# Patient Record
Sex: Female | Born: 1945 | Race: White | Hispanic: No | Marital: Married | Smoking: Never smoker
Health system: Southern US, Community
[De-identification: ages and names within clinical notes are randomized; demographics above are authoritative.]

## PROBLEM LIST (undated history)

## (undated) DIAGNOSIS — K219 Gastro-esophageal reflux disease without esophagitis: Secondary | ICD-10-CM

## (undated) DIAGNOSIS — I639 Cerebral infarction, unspecified: Secondary | ICD-10-CM

## (undated) DIAGNOSIS — M549 Dorsalgia, unspecified: Secondary | ICD-10-CM

## (undated) DIAGNOSIS — R0602 Shortness of breath: Secondary | ICD-10-CM

## (undated) DIAGNOSIS — R112 Nausea with vomiting, unspecified: Secondary | ICD-10-CM

## (undated) DIAGNOSIS — H269 Unspecified cataract: Secondary | ICD-10-CM

## (undated) DIAGNOSIS — K829 Disease of gallbladder, unspecified: Secondary | ICD-10-CM

## (undated) DIAGNOSIS — N159 Renal tubulo-interstitial disease, unspecified: Secondary | ICD-10-CM

## (undated) DIAGNOSIS — Z5189 Encounter for other specified aftercare: Secondary | ICD-10-CM

## (undated) DIAGNOSIS — E785 Hyperlipidemia, unspecified: Secondary | ICD-10-CM

## (undated) DIAGNOSIS — D126 Benign neoplasm of colon, unspecified: Secondary | ICD-10-CM

## (undated) DIAGNOSIS — M7989 Other specified soft tissue disorders: Secondary | ICD-10-CM

## (undated) DIAGNOSIS — M1990 Unspecified osteoarthritis, unspecified site: Secondary | ICD-10-CM

## (undated) DIAGNOSIS — K432 Incisional hernia without obstruction or gangrene: Secondary | ICD-10-CM

## (undated) DIAGNOSIS — M869 Osteomyelitis, unspecified: Secondary | ICD-10-CM

## (undated) DIAGNOSIS — K5900 Constipation, unspecified: Secondary | ICD-10-CM

## (undated) DIAGNOSIS — I10 Essential (primary) hypertension: Secondary | ICD-10-CM

## (undated) DIAGNOSIS — G809 Cerebral palsy, unspecified: Secondary | ICD-10-CM

## (undated) DIAGNOSIS — Z936 Other artificial openings of urinary tract status: Secondary | ICD-10-CM

## (undated) DIAGNOSIS — Z9889 Other specified postprocedural states: Secondary | ICD-10-CM

## (undated) HISTORY — DX: Osteomyelitis, unspecified: M86.9

## (undated) HISTORY — DX: Essential (primary) hypertension: I10

## (undated) HISTORY — DX: Unspecified cataract: H26.9

## (undated) HISTORY — DX: Other specified soft tissue disorders: M79.89

## (undated) HISTORY — DX: Constipation, unspecified: K59.00

## (undated) HISTORY — DX: Cerebral infarction, unspecified: I63.9

## (undated) HISTORY — DX: Renal tubulo-interstitial disease, unspecified: N15.9

## (undated) HISTORY — DX: Dorsalgia, unspecified: M54.9

## (undated) HISTORY — PX: CHOLECYSTECTOMY: SHX55

## (undated) HISTORY — PX: TONSILLECTOMY: SUR1361

## (undated) HISTORY — PX: APPENDECTOMY: SHX54

## (undated) HISTORY — DX: Hyperlipidemia, unspecified: E78.5

## (undated) HISTORY — DX: Unspecified osteoarthritis, unspecified site: M19.90

## (undated) HISTORY — DX: Cerebral palsy, unspecified: G80.9

## (undated) HISTORY — DX: Disease of gallbladder, unspecified: K82.9

## (undated) HISTORY — PX: OTHER SURGICAL HISTORY: SHX169

## (undated) HISTORY — DX: Shortness of breath: R06.02

## (undated) HISTORY — DX: Benign neoplasm of colon, unspecified: D12.6

---
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
## 1969-06-16 HISTORY — PX: WISDOM TOOTH EXTRACTION: SHX21

## 1973-06-16 HISTORY — PX: TUBAL LIGATION: SHX77

## 1973-06-16 HISTORY — PX: OTHER SURGICAL HISTORY: SHX169

## 1981-06-16 HISTORY — PX: CARPAL TUNNEL RELEASE: SHX101

## 1983-06-17 HISTORY — PX: OTHER SURGICAL HISTORY: SHX169

## 1984-06-16 HISTORY — PX: OTHER SURGICAL HISTORY: SHX169

## 1988-06-16 HISTORY — PX: ABDOMINAL HYSTERECTOMY: SHX81

## 1988-06-16 HISTORY — PX: VAGINAL HYSTERECTOMY: SUR661

## 1990-06-16 DIAGNOSIS — IMO0001 Reserved for inherently not codable concepts without codable children: Secondary | ICD-10-CM

## 1990-06-16 DIAGNOSIS — Z5189 Encounter for other specified aftercare: Secondary | ICD-10-CM

## 1990-06-16 HISTORY — PX: OTHER SURGICAL HISTORY: SHX169

## 1990-06-16 HISTORY — PX: REVISION UROSTOMY CUTANEOUS: SUR1282

## 1990-06-16 HISTORY — DX: Reserved for inherently not codable concepts without codable children: IMO0001

## 1990-06-16 HISTORY — DX: Encounter for other specified aftercare: Z51.89

## 2002-03-27 ENCOUNTER — Emergency Department (HOSPITAL_COMMUNITY): Admission: EM | Admit: 2002-03-27 | Discharge: 2002-03-27 | Payer: Self-pay | Admitting: Emergency Medicine

## 2002-03-27 ENCOUNTER — Encounter: Payer: Self-pay | Admitting: Emergency Medicine

## 2006-02-10 ENCOUNTER — Encounter: Payer: Self-pay | Admitting: Family Medicine

## 2008-02-08 ENCOUNTER — Encounter: Payer: Self-pay | Admitting: Family Medicine

## 2008-04-05 ENCOUNTER — Encounter: Payer: Self-pay | Admitting: Family Medicine

## 2008-07-05 LAB — HM COLONOSCOPY: HM Colonoscopy: NORMAL

## 2008-09-07 ENCOUNTER — Encounter: Payer: Self-pay | Admitting: Family Medicine

## 2009-03-16 LAB — CONVERTED CEMR LAB: Pap Smear: NORMAL

## 2009-11-29 ENCOUNTER — Ambulatory Visit: Payer: Self-pay | Admitting: Emergency Medicine

## 2009-11-29 DIAGNOSIS — A088 Other specified intestinal infections: Secondary | ICD-10-CM

## 2009-11-29 DIAGNOSIS — E785 Hyperlipidemia, unspecified: Secondary | ICD-10-CM | POA: Insufficient documentation

## 2009-11-29 DIAGNOSIS — I10 Essential (primary) hypertension: Secondary | ICD-10-CM | POA: Insufficient documentation

## 2009-11-29 HISTORY — DX: Essential (primary) hypertension: I10

## 2009-11-29 HISTORY — DX: Hyperlipidemia, unspecified: E78.5

## 2010-06-19 ENCOUNTER — Encounter: Payer: Self-pay | Admitting: Family Medicine

## 2010-07-11 ENCOUNTER — Ambulatory Visit
Admission: RE | Admit: 2010-07-11 | Discharge: 2010-07-11 | Payer: Self-pay | Source: Home / Self Care | Attending: Family Medicine | Admitting: Family Medicine

## 2010-07-11 DIAGNOSIS — D126 Benign neoplasm of colon, unspecified: Secondary | ICD-10-CM

## 2010-07-11 DIAGNOSIS — G809 Cerebral palsy, unspecified: Secondary | ICD-10-CM

## 2010-07-11 HISTORY — DX: Cerebral palsy, unspecified: G80.9

## 2010-07-11 HISTORY — DX: Benign neoplasm of colon, unspecified: D12.6

## 2010-07-16 NOTE — Assessment & Plan Note (Signed)
Summary: VOMIT/DIARRHEA/TJ   Vital Signs:  Patient Profile:   65 Years Old Female CC:      Vomiting x 1 day Height:     69.5 inches Weight:      195 pounds O2 Sat:      97 % O2 treatment:    Room Air Temp:     98.4 degrees F Pulse rate:   122 / minute Pulse rhythm:   irregular Resp:     20 per minute BP sitting:   140 / 72  (right arm) Cuff size:   large  Vitals Entered By: Emilio Math (November 29, 2009 10:47 AM)             Is Patient Diabetic? Yes       Current Allergies: ! MORPHINE ! REGLAN ! PENICILLINHistory of Present Illness Chief Complaint: Vomiting x 1 day History of Present Illness: 65yo WF with multiple med problems (DM2, Cerebral palsy) presents with N/V x 10 hours.  Started early this morning and 6 episodes.  None in last 3 hours.  No strange or spoiled foods.  Her husband who has eaten the same foods feels fine.  Her BS this morning was 160.  No blood.  Loose stools today but no diarrhea.  Abd pain is mild and cramping.  Current Meds SIMVASTATIN 40 MG TABS (SIMVASTATIN)  LOSARTAN POTASSIUM 50 MG TABS (LOSARTAN POTASSIUM)  FUROSEMIDE 80 MG TABS (FUROSEMIDE)  METFORMIN HCL 500 MG TABS (METFORMIN HCL)  MIRALAX  POWD (POLYETHYLENE GLYCOL 3350)  MUCINEX 600 MG XR12H-TAB (GUAIFENESIN)  ZOFRAN ODT 4 MG TBDP (ONDANSETRON) 1 dissolvable tab by mouth Q6-8 hours as needed nausea  REVIEW OF SYSTEMS Constitutional Symptoms      Denies fever, chills, night sweats, weight loss, weight gain, and fatigue.  Eyes       Denies change in vision, eye pain, eye discharge, glasses, contact lenses, and eye surgery. Ear/Nose/Throat/Mouth       Denies hearing loss/aids, change in hearing, ear pain, ear discharge, dizziness, frequent runny nose, frequent nose bleeds, sinus problems, sore throat, hoarseness, and tooth pain or bleeding.  Respiratory       Denies dry cough, productive cough, wheezing, shortness of breath, asthma, bronchitis, and emphysema/COPD.  Cardiovascular  Denies murmurs, chest pain, and tires easily with exhertion.    Gastrointestinal       Complains of nausea/vomiting.      Denies stomach pain, diarrhea, constipation, blood in bowel movements, and indigestion. Genitourniary       Denies painful urination, kidney stones, and loss of urinary control. Neurological       Denies paralysis, seizures, and fainting/blackouts. Musculoskeletal       Denies muscle pain, joint pain, joint stiffness, decreased range of motion, redness, swelling, muscle weakness, and gout.  Skin       Denies bruising, unusual mles/lumps or sores, and hair/skin or nail changes.  Psych       Denies mood changes, temper/anger issues, anxiety/stress, speech problems, depression, and sleep problems.  Past History:  Past Medical History: Diabetes mellitus, type I Hyperlipidemia Hypertension Cerebral Palsy Urostomy with Ileal Conduit  Past Surgical History: Hysterectomy Many orthopedic surgeries  Family History: Mother, D, COPD, Heart Disease Father, D, COPD, Emphysema, Bladder CA, Colon CA  Social History: Non smoker No ETOH No DRugs Physical Exam General appearance: well developed, well nourished, no acute distress.  Sitting in wheelchair. Chest/Lungs: no rales, wheezes, or rhonchi bilateral, breath sounds equal without effort Heart: regular rate and  rhythm,  no murmur Abdomen: positive mild epigastric tenderness, abdomen soft without obvious organomegaly Extremities: normal extremities Skin: no obvious rashes or lesions Assessment New Problems: GASTROENTERITIS, VIRAL (ICD-008.8) HYPERTENSION (ICD-401.9) HYPERLIPIDEMIA (ICD-272.4) DIABETES MELLITUS, TYPE I (ICD-250.01)   Plan New Medications/Changes: ZOFRAN ODT 4 MG TBDP (ONDANSETRON) 1 dissolvable tab by mouth Q6-8 hours as needed nausea  #20 x 0, 11/29/2009, Hoyt Koch MD   The patient and/or caregiver has been counseled thoroughly with regard to medications prescribed including dosage,  schedule, interactions, rationale for use, and possible side effects and they verbalize understanding.  Diagnoses and expected course of recovery discussed and will return if not improved as expected or if the condition worsens. Patient and/or caregiver verbalized understanding.  Prescriptions: ZOFRAN ODT 4 MG TBDP (ONDANSETRON) 1 dissolvable tab by mouth Q6-8 hours as needed nausea  #20 x 0   Entered and Authorized by:   Hoyt Koch MD   Signed by:   Hoyt Koch MD on 11/29/2009   Method used:   Print then Give to Patient   RxID:   [id number]   Patient Instructions: 1)  Parke Simmers diet for 5-7 days, expect diarrhea as virus progresses through your system, check BS a few times a day, Follow-up with your primary care physician.

## 2010-07-18 NOTE — Assessment & Plan Note (Signed)
Summary: to be est/new medicare pt/njr   Vital Signs:  Patient profile:   65 year old female Menstrual status:  postmenopausal Height:      59.25 inches Weight:      202 pounds BMI:     40.60 Temp:     98.4 degrees F oral Pulse rate:   80 / minute Pulse rhythm:   regular Resp:     12 per minute BP sitting:   140 / 76  (left arm) Cuff size:   large  Vitals Entered By: Sid Falcon LPN (July 11, 2010 10:34 AM)  Nutrition Counseling: Patient's BMI is greater than 25 and therefore counseled on weight management options.  History of Present Illness:  New patient to establish care.  Past medical history reviewed. History of cerebral palsy, osteoarthritis mostly involving low back, type 2 diabetes diagnosed 2010, hypertension, hyperlipidemia, benign colon polyps, neurogenic bladder with prior history of urostomy and ileal conduit surgery.  Multiple prior surgeries including cholecystectomy, appendectomy, tonsillectomy, and partial hysterectomy secondary to menorrhagia. Previous right ankle fusion and urology surgery as above. Multiple prior orthopedic surgeries related to cerebral palsy.  Allergy to morphine, penicillin, and Reglan.  Medications all reviewed. Recent titration of the losartan secondary to high urine microalbumin ratio.  Diabetes and well-controlled. Recent A1c 6.7%. Recent lipids well-controlled. Normal GFR.  Family history significant for father with colon cancer and prostate cancer. Both parents with hypertension. Mother had stroke and heart disease in her 82s. Sister with rheumatoid arthritis.  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 No alcohol or nicotine use.  Tetanus 2010. Pneumovax 2006. Colonoscopy 2010 with rec 5 year followup. Positive flu vaccine earlier this year.   Preventive Screening-Counseling & Management  Caffeine-Diet-Exercise     Does Patient Exercise: yes  Allergies: 1)  ! Morphine 2)   ! Reglan 3)  ! Penicillin  Past History:  Family History: Last updated: 07/11/2010 Mother, D, COPD, Heart Disease 40s Father, D, COPD, Emphysema, Bladder CA, Colon CA Sister RA  Social History: Last updated: 07/11/2010 Non smoker No ETOH No DRugs Occupation:  Veterinary surgeon, Merchant [employer] officer Regular exercise-yes 1 pregnancy 1 live birth  Risk Factors: Exercise: yes (07/11/2010)  Past Medical History: Diabetes mellitus, type 2 Hyperlipidemia Hypertension Cerebral Palsy Osteoarthritis Hx neurogenic bladder  Past Surgical History: Hysterectomy, partial menorrhagia Many orthopedic surgeries Appendectomy Cholecystectomy Tonsillectomy Urostomy with ilial conduit 1992  Family History: Mother, D, COPD, Heart Disease 108s Father, D, COPD, Emphysema, Bladder CA, Colon CA Sister RA  Social History: Non smoker No ETOH No DRugs Occupation:  Veterinary surgeon, Merchant [employer] officer Regular exercise-yes 1 pregnancy 1 live birth Occupation:  employed Does Patient Exercise:  yes  Review of Systems  The patient denies anorexia, fever, weight loss, vision loss, hoarseness, chest pain, syncope, prolonged cough, headaches, hemoptysis, abdominal pain, melena, hematochezia, severe indigestion/heartburn, hematuria, muscle weakness, suspicious skin lesions, transient blindness, depression, and enlarged lymph nodes.    Physical Exam  General:  obese, pleasant cooerative  Head:  Normocephalic and atraumatic without obvious abnormalities. No apparent alopecia or balding. Eyes:  pupils equal, pupils round, and pupils reactive to light.   Ears:  minimal cerumen R ear. Mouth:  Oral mucosa and oropharynx without lesions or exudates.  Teeth in good repair. Neck:  No deformities, masses, or tenderness noted. Lungs:  Normal respiratory effort, chest expands symmetrically. Lungs are clear to auscultation, no crackles or wheezes. Heart:  Normal rate and regular rhythm. S1 and S2 normal without  gallop, murmur,  click, rub or other extra sounds. Extremities:  No clubbing, cyanosis, edema, or deformity noted with normal full range of motion of all joints.   Neurologic:  alert & oriented X3 and cranial nerves II-XII intact.    Diabetes Management Exam:    Foot Exam (with socks and/or shoes not present):       Sensory-Pinprick/Light touch:          Left medial foot (L-4): normal          Left dorsal foot (L-5): normal          Left lateral foot (S-1): normal          Right medial foot (L-4): normal          Right dorsal foot (L-5): normal          Right lateral foot (S-1): normal       Sensory-Monofilament:          Left foot: normal          Right foot: normal       Inspection:          Left foot: normal          Right foot: normal       Nails:          Left foot: normal          Right foot: normal   Impression & Recommendations:  Problem # 1:  HYPERLIPIDEMIA (ICD-272.4)  Her updated medication list for this problem includes:    Simvastatin 40 Mg Tabs (Simvastatin) ..... Once daily at bedtime  Problem # 2:  DIABETES MELLITUS, TYPE I (ICD-250.01)  Her updated medication list for this problem includes:    Losartan Potassium 100 Mg Tabs (Losartan potassium) ..... Once daily in morning    Metformin Hcl 500 Mg Tabs (Metformin hcl) .Marland Kitchen... 2 times daily  Problem # 3:  HYPERTENSION (ICD-401.9)  Her updated medication list for this problem includes:    Losartan Potassium 100 Mg Tabs (Losartan potassium) ..... Once daily in morning    Furosemide 80 Mg Tabs (Furosemide) ..... Once daily  Problem # 4:  CEREBRAL PALSY (ICD-343.9)  Problem # 5:  COLONIC POLYPS (ICD-211.3)  Complete Medication List: 1)  Simvastatin 40 Mg Tabs (Simvastatin) .... Once daily at bedtime 2)  Losartan Potassium 100 Mg Tabs (Losartan potassium) .... Once daily in morning 3)  Furosemide 80 Mg Tabs (Furosemide) .... Once daily 4)  Metformin Hcl 500 Mg Tabs (Metformin hcl) .... 2 times daily 5)   Miralax Powd (Polyethylene glycol 3350) .Marland Kitchen.. 1 capfull daily by mouth with liquid 6)  Mucinex 600 Mg Xr12h-tab (Guaifenesin) .... Once daily 7)  Zyrtec Allergy 10 Mg Tabs (Cetirizine hcl) .... Once daily  Patient Instructions: 1)  Please schedule a follow-up appointment in 3 months .    Orders Added: 1)  New Patient Level III [99203]   Immunization History:  Influenza Immunization History:    Influenza:  historical (04/16/2010)  Pneumovax Immunization History:    Pneumovax:  historical (06/16/2004)  Tetanus/Td Immunization History:    Tetanus/Td:  historical (06/16/2005)   Immunization History:  Influenza Immunization History:    Influenza:  Historical (04/16/2010)  Pneumovax Immunization History:    Pneumovax:  Historical (06/16/2004)  Tetanus/Td Immunization History:    Tetanus/Td:  Historical (06/16/2005)  Preventive Care Screening  Last Flu Shot:    Date:  04/16/2010    Results:  Historical   Pap Smear:    Date:  03/16/2009  Results:  normal   Colonoscopy:    Date:  06/16/2008    Results:  normal   Mammogram:    Date:  06/16/2008    Results:  normal   Last Tetanus Booster:    Date:  06/16/2005    Results:  Historical   Last Pneumovax:    Date:  06/16/2004    Results:  Historical

## 2010-07-23 ENCOUNTER — Other Ambulatory Visit: Payer: Self-pay | Admitting: Family Medicine

## 2010-07-24 NOTE — Procedures (Signed)
Summary: Colonoscopy Report/[facility]   Colonoscopy Report/[facility]   Imported By: Maryln Gottron 07/17/2010 11:23:19  _____________________________________________________________________  External Attachment:    Type:   Image     Comment:   External Document

## 2010-08-20 ENCOUNTER — Telehealth: Payer: Self-pay | Admitting: Family Medicine

## 2010-08-20 NOTE — Telephone Encounter (Signed)
Pt has a power wheelchair that is 65 yrs old she's having problem with it. Pt would like a rx fax to # [phone] to  [facility] physical therapy/sport medicine for evaluation for a new wheelchair.

## 2010-08-20 NOTE — Telephone Encounter (Signed)
written

## 2010-08-20 NOTE — Telephone Encounter (Signed)
Written Rx for evaluation of power wheelchair faxed to [facility] at [phone], confirmation received

## 2010-10-04 ENCOUNTER — Encounter: Payer: Self-pay | Admitting: Family Medicine

## 2010-10-09 ENCOUNTER — Ambulatory Visit (INDEPENDENT_AMBULATORY_CARE_PROVIDER_SITE_OTHER): Payer: Medicare Other | Admitting: Family Medicine

## 2010-10-09 ENCOUNTER — Encounter: Payer: Self-pay | Admitting: Family Medicine

## 2010-10-09 DIAGNOSIS — I10 Essential (primary) hypertension: Secondary | ICD-10-CM

## 2010-10-09 DIAGNOSIS — G809 Cerebral palsy, unspecified: Secondary | ICD-10-CM

## 2010-10-09 DIAGNOSIS — E119 Type 2 diabetes mellitus without complications: Secondary | ICD-10-CM | POA: Insufficient documentation

## 2010-10-09 LAB — BASIC METABOLIC PANEL
BUN: 19 mg/dL (ref 6–23)
Calcium: 9.9 mg/dL (ref 8.4–10.5)
Creatinine, Ser: 0.7 mg/dL (ref 0.4–1.2)
GFR: 87.44 mL/min (ref >60.00)
Glucose, Bld: 130 mg/dL — ABNORMAL HIGH (ref 70–99)

## 2010-10-09 NOTE — Progress Notes (Signed)
  Subjective:    Patient ID: Jordan [name], female    DOB: 30-Aug-1945, 65 y.o.   MRN: [id number]  HPI Patient seen for medical followup. She has history of type 2 diabetes, hyperlipidemia, morbid obesity, hypertension, and cerebral palsy. Blood sugars ranging 137 160 fasting. Takes metformin 500 mg twice daily. Last hemoglobin A1c 6.7% several months ago. She's never had any nutritional counseling.  She has history of cerebral palsy. Multiple back surgery and multiple prior orthopedic surgeries. She is wheelchair-bound. Needs replacement wheelchair. Also has significant upper extremity weakness and needs totally powered wheelchair. She is able to mobilize within her home without difficulty. She has limited neck mobility secondary to arthritis. She is fully competent to maneuver her wheelchair.  Diuretic therapy with furosemide 80 mg daily. Also takes Cozaar. No potassium replacement. Needs electrolytes.   Review of Systems  Constitutional: Negative for fever, chills, appetite change and unexpected weight change.  Respiratory: Negative for cough and shortness of breath.   Cardiovascular: Negative for chest pain and palpitations.  Genitourinary:       [She has neurogenic bladder and some chronic urinary incontinence. Followed by urology Neurological: Negative for dizziness and headaches.       Objective:   Physical Exam  Constitutional: She is oriented to person, place, and time. She appears well-developed and well-nourished.  HENT:  Head: Normocephalic and atraumatic.  Mouth/Throat: Oropharynx is clear and moist. No oropharyngeal exudate.  Cardiovascular: Normal rate, regular rhythm and normal heart sounds.   No murmur heard. Pulmonary/Chest: Effort normal and breath sounds normal. No respiratory distress. She has no wheezes. She has no rales.  Musculoskeletal:       Chest some chronic nonpitting edema of feet ankles and lower legs bilaterally  Lymphadenopathy:    She has no cervical  adenopathy.  Neurological: She is alert and oriented to person, place, and time. No cranial nerve deficit.  Psychiatric: She has a normal mood and affect.          Assessment & Plan:  #1 type 2 diabetes. History of decent control. Recheck A1c today. Recommend referral to nutritionist for further education #2 diuretic use and hypertension. Recheck basic metabolic panel. #3 Cerebral palsy. Patient needs replacement wheelchair. She'll initiate paperwork Through supplier

## 2010-10-09 NOTE — Progress Notes (Signed)
Quick Note:  Pt informed on personally identified VM, med sig changed to new directions ______

## 2010-10-18 ENCOUNTER — Other Ambulatory Visit: Payer: Self-pay | Admitting: Family Medicine

## 2010-10-18 MED ORDER — METFORMIN HCL 500 MG PO TABS: ORAL_TABLET | ORAL | Status: DC

## 2010-10-22 ENCOUNTER — Encounter: Payer: Medicare Other | Attending: Family Medicine | Admitting: *Deleted

## 2010-10-22 DIAGNOSIS — I10 Essential (primary) hypertension: Secondary | ICD-10-CM | POA: Insufficient documentation

## 2010-10-22 DIAGNOSIS — Z713 Dietary counseling and surveillance: Secondary | ICD-10-CM | POA: Insufficient documentation

## 2010-10-22 DIAGNOSIS — E119 Type 2 diabetes mellitus without complications: Secondary | ICD-10-CM | POA: Insufficient documentation

## 2010-10-31 ENCOUNTER — Telehealth: Payer: Self-pay | Admitting: Family Medicine

## 2010-10-31 NOTE — Telephone Encounter (Signed)
Was seen recently and a new rx for metformin with brand new instructions were supposed to be at [facility]. Need status.

## 2010-10-31 NOTE — Telephone Encounter (Signed)
Pt informed on VM that I checked with the pharmacy and they do have the Rx from 5/4, #90 with 11 refills

## 2010-12-03 ENCOUNTER — Encounter: Payer: Medicare Other | Attending: Family Medicine | Admitting: *Deleted

## 2010-12-03 DIAGNOSIS — I10 Essential (primary) hypertension: Secondary | ICD-10-CM | POA: Insufficient documentation

## 2010-12-03 DIAGNOSIS — Z713 Dietary counseling and surveillance: Secondary | ICD-10-CM | POA: Insufficient documentation

## 2010-12-03 DIAGNOSIS — E119 Type 2 diabetes mellitus without complications: Secondary | ICD-10-CM | POA: Insufficient documentation

## 2010-12-19 ENCOUNTER — Other Ambulatory Visit: Payer: Self-pay | Admitting: Family Medicine

## 2010-12-28 ENCOUNTER — Other Ambulatory Visit: Payer: Self-pay | Admitting: Family Medicine

## 2010-12-31 ENCOUNTER — Other Ambulatory Visit (INDEPENDENT_AMBULATORY_CARE_PROVIDER_SITE_OTHER): Payer: Medicare Other

## 2010-12-31 ENCOUNTER — Other Ambulatory Visit: Payer: Self-pay | Admitting: *Deleted

## 2010-12-31 DIAGNOSIS — Z0000 Encounter for general adult medical examination without abnormal findings: Secondary | ICD-10-CM

## 2010-12-31 DIAGNOSIS — E119 Type 2 diabetes mellitus without complications: Secondary | ICD-10-CM

## 2010-12-31 DIAGNOSIS — E785 Hyperlipidemia, unspecified: Secondary | ICD-10-CM

## 2010-12-31 LAB — POCT URINALYSIS DIPSTICK
Bilirubin, UA: NEGATIVE
Ketones, UA: NEGATIVE
pH, UA: 9

## 2010-12-31 LAB — MICROALBUMIN / CREATININE URINE RATIO
Creatinine,U: 55.3 mg/dL
Microalb Creat Ratio: 152 mg/g — ABNORMAL HIGH (ref 0.0–30.0)
Microalb, Ur: 84.1 mg/dL — ABNORMAL HIGH (ref 0.0–1.9)

## 2010-12-31 LAB — LIPID PANEL
HDL: 47.2 mg/dL (ref >39.00)
Triglycerides: 157 mg/dL — ABNORMAL HIGH (ref 0.0–149.0)

## 2010-12-31 LAB — CBC WITH DIFFERENTIAL/PLATELET
Basophils Absolute: 0 10*3/uL (ref 0.0–0.1)
Eosinophils Absolute: 0.1 10*3/uL (ref 0.0–0.7)
HCT: 40 % (ref 36.0–46.0)
Hemoglobin: 13.4 g/dL (ref 12.0–15.0)
Lymphs Abs: 2.7 10*3/uL (ref 0.7–4.0)
MCHC: 33.5 g/dL (ref 30.0–36.0)
Monocytes Absolute: 0.4 10*3/uL (ref 0.1–1.0)
Neutro Abs: 3.2 10*3/uL (ref 1.4–7.7)
RDW: 13.9 % (ref 11.5–14.6)

## 2010-12-31 LAB — HEPATIC FUNCTION PANEL: Albumin: 4.4 g/dL (ref 3.5–5.2)

## 2010-12-31 LAB — BASIC METABOLIC PANEL
CO2: 26 mEq/L (ref 19–32)
Glucose, Bld: 132 mg/dL — ABNORMAL HIGH (ref 70–99)
Potassium: 4.2 mEq/L (ref 3.5–5.1)
Sodium: 143 mEq/L (ref 135–145)

## 2010-12-31 LAB — HEMOGLOBIN A1C: Hgb A1c MFr Bld: 7.2 % — ABNORMAL HIGH (ref 4.6–6.5)

## 2010-12-31 MED ORDER — FUROSEMIDE 80 MG PO TABS: 80.0000 mg | ORAL_TABLET | Freq: Two times a day (BID) | ORAL | Status: DC

## 2011-01-07 ENCOUNTER — Encounter: Payer: Self-pay | Admitting: Family Medicine

## 2011-01-07 ENCOUNTER — Ambulatory Visit (INDEPENDENT_AMBULATORY_CARE_PROVIDER_SITE_OTHER): Payer: Medicare Other | Admitting: Family Medicine

## 2011-01-07 DIAGNOSIS — Z111 Encounter for screening for respiratory tuberculosis: Secondary | ICD-10-CM

## 2011-01-07 DIAGNOSIS — E119 Type 2 diabetes mellitus without complications: Secondary | ICD-10-CM

## 2011-01-07 DIAGNOSIS — Z0000 Encounter for general adult medical examination without abnormal findings: Secondary | ICD-10-CM

## 2011-01-07 DIAGNOSIS — Z23 Encounter for immunization: Secondary | ICD-10-CM

## 2011-01-07 DIAGNOSIS — I10 Essential (primary) hypertension: Secondary | ICD-10-CM

## 2011-01-07 NOTE — Progress Notes (Signed)
Subjective:    Patient ID: Jordan [name], female    DOB: 1946/01/19, 65 y.o.   MRN: [id number]  HPI Patient here for well visit. She has chronic problems of hyperlipidemia, obesity, cerebral palsy, hypertension, and type 2 diabetes. Has recently made some dietary changes. Blood sugars been stable. Recent A1c 7.2% which is down from 7.4% 3 months ago. Medications reviewed. She varies Lasix between 80 and  160 mg daily. Using compressive stockings.  Tetanus 2007. Colonoscopy 2010. Previous Pneumovax but over 5 years ago and none since turning 63. Last mammogram almost 2 years ago. Previous hysterectomy for benign disease so no indication for Pap smear.  1.  Risk factors based on Past Medical , Social, and Family history  PMH, SH, and FH reviewed and as noted. 2.  Limitations in physical activities Very limited walking secondary to cerebral palsy.   3.  Depression/mood  No current depression or anxiety issues. 4.  Hearing  No acute problems.  Has been evaluated by audiologist. 5.  ADLs  Independent in all. 6.  Cognitive function (orientation to time and place, language, writing, speech,memory)  No short or long term memory deficits.  Judgement intact. 7.  Home Safety  No concerns.   8.  Height, weight, and visual acuity.  No changes. 9.  Counseling 10. Recommendation of preventive services.  Pneumovax and continue with yearly flu vaccine. 11. Labs based on risk factors  Labs reviewed-A1C, lipid, hepatic, BMP, CBC, TSH. 12. Care Plan  As above.  Continue with weight loss and exercise efforts.  Repeat A1C in 3 months.   Past Medical History  Diagnosis Date  . DIABETES MELLITUS, TYPE I 11/29/2009  . HYPERLIPIDEMIA 11/29/2009  . HYPERTENSION 11/29/2009  . COLONIC POLYPS 07/11/2010  . CEREBRAL PALSY 07/11/2010   Past Surgical History  Procedure Date  . Appendectomy   . Cholecystectomy   . Tonsillectomy   . Revision urostomy cutaneous 1992  . Abdominal hysterectomy 1990    partial  . Radical  cystectomy 1992    neurogenic bladder sec CP    reports that she has never smoked. She does not have any smokeless tobacco history on file. Her alcohol and drug histories not on file. family history includes Cancer in her father; Emphysema in her father; Heart disease in her father; and Heart disease (age of onset:70) in her mother. Allergies  Allergen Reactions  . Baclofen     GI upset  . Metoclopramide Hcl     Reglan, irritable  . Morphine   . Penicillins       Review of Systems  Constitutional: Negative for fever, activity change, appetite change and fatigue.  HENT: Negative for hearing loss, ear pain, sore throat and trouble swallowing.   Eyes: Negative for visual disturbance.  Respiratory: Negative for cough and shortness of breath.   Cardiovascular: Negative for chest pain and palpitations.  Gastrointestinal: Negative for abdominal pain, diarrhea, constipation and blood in stool.  Genitourinary: Negative for dysuria and hematuria.  Musculoskeletal: Negative for myalgias, back pain and arthralgias.  Skin: Negative for rash.  Neurological: Negative for dizziness, syncope and headaches.  Hematological: Negative for adenopathy.  Psychiatric/Behavioral: Negative for confusion and dysphoric mood.       Objective:   Physical Exam  Constitutional: She is oriented to person, place, and time. She appears well-developed and well-nourished.  HENT:  Head: Normocephalic and atraumatic.  Eyes: EOM are normal. Pupils are equal, round, and reactive to light.  Neck: Normal range of motion. Neck supple.  No thyromegaly present.  Cardiovascular: Normal rate, regular rhythm and normal heart sounds.   No murmur heard. Pulmonary/Chest: Breath sounds normal. No respiratory distress. She has no wheezes. She has no rales.       Breasts symmetric without mass.  No nipple changes.  Abdominal: Soft. Bowel sounds are normal. She exhibits no distension and no mass. There is no tenderness. There is  no rebound and no guarding.  Musculoskeletal: Normal range of motion. She exhibits no edema.  Lymphadenopathy:    She has no cervical adenopathy.  Neurological: She is alert and oriented to person, place, and time. She displays normal reflexes. No cranial nerve deficit.  Skin: No rash noted.  Psychiatric: She has a normal mood and affect. Her behavior is normal. Judgment and thought content normal.          Assessment & Plan:  Health maintenance exam. Information regarding mammogram. Pneumovax booster given. Place PPD which is required for medical form she has to get completed. Followup 2-3 days have read.  Colonoscopy up to date.  Labs reviewed with patient.  Type 2 diabetes A1C is slightly improved at 7.2%.  Continue weight loss and exercise efforts.

## 2011-01-07 NOTE — Patient Instructions (Signed)
Schedule mammogram.  Return in 2 days for PPD reading.

## 2011-01-08 ENCOUNTER — Ambulatory Visit: Payer: Medicare Other | Admitting: Family Medicine

## 2011-03-05 ENCOUNTER — Telehealth: Payer: Self-pay | Admitting: *Deleted

## 2011-03-05 ENCOUNTER — Encounter: Payer: Self-pay | Admitting: *Deleted

## 2011-03-05 ENCOUNTER — Encounter: Payer: Medicare Other | Attending: Family Medicine | Admitting: *Deleted

## 2011-03-05 DIAGNOSIS — I10 Essential (primary) hypertension: Secondary | ICD-10-CM

## 2011-03-05 DIAGNOSIS — E119 Type 2 diabetes mellitus without complications: Secondary | ICD-10-CM | POA: Insufficient documentation

## 2011-03-05 MED ORDER — ACCU-CHEK SOFT TOUCH LANCETS MISC: Status: DC

## 2011-03-05 MED ORDER — GLUCOSE BLOOD VI STRP: ORAL_STRIP | Status: DC

## 2011-03-05 NOTE — Telephone Encounter (Signed)
Pt is requesting a new glucometer.  Please call pt as she has some questions, and is interested in a certain unit.

## 2011-03-05 NOTE — Progress Notes (Signed)
Medical Nutrition Therapy:  Appt start time: 1030 end time:  1100.  Assessment:  Primary concerns today: T2DM Follow-up.  Pt here for f/u. Reports wt of 191 lbs and increased exercise.  Pt is frustrated that A1c only down to 7.2% (12/31/10) from 7.4% (10/08/10).  Reports recent BGs ranging from 137-160 mg/dL, but believes her meter is "messed up". Referred back to MD for control solution or new meter. Recall shows continued excessive CHO intake at meals.   MEDICATIONS: No changes.  24-hr recall:  B (AM): Scrambled egg w/ american cheese, 2 small yeast rolls; water  Snk (AM): none  L (PM): Salad w/ grilled chicken and ranch dressing; water  Snk (PM): none D (7 PM): Baked chicken w/ sweet & sour sauce ("a little"), green beans, rice (2/3 c), watermelon (1 c); [place] diet peach tea Snk (8-8:30 PM): 6 pk nabs (pb)  Recent physical activity: Gym 3 days/wk  Estimated energy needs: 1500 calories 165-170 g carbohydrates 110-115 g protein 40-42 g fat  Progress Towards Goal(s):  In progress.   Nutritional Diagnosis:  [place]-2.1 Impaired nutrient utilization related to excessive CHO intake at meals as evidenced by 24-hour recall and a recent A1c of 7.2%.    Intervention:    Continue previous nutrition goals including Diabetic Meal Plan of 30-40 g CHO at meals and 15 g at snacks.  Decrease meals away from home including take-out.  Aim for <2000 mg of sodium daily.  Limit sugar sweetened beverages and concentrated sweets.  Increase fiber to 25-30 g daily.  Aim for 45-60 min of physical activity most days.  Monitoring/Evaluation:  Dietary intake, exercise, A1c, and body weight in 3 month(s).

## 2011-03-05 NOTE — Patient Instructions (Addendum)
Goals:  Continue previous nutrition goals including Diabetic Meal Plan of 30-40 g CHO at meals and 15 g at snacks.  Decrease meals away from home including take-out.  Aim for <2000 mg of sodium daily.  Limit sugar sweetened beverages and concentrated sweets.  Increase fiber to 25-30 g daily.  Aim for 45-60 min of physical activity most days.

## 2011-03-05 NOTE — Telephone Encounter (Signed)
Pt needs test strips and lancets for [employer] nano BS machine, done

## 2011-03-06 ENCOUNTER — Other Ambulatory Visit: Payer: Self-pay | Admitting: Family Medicine

## 2011-03-07 ENCOUNTER — Encounter: Payer: Self-pay | Admitting: *Deleted

## 2011-03-21 ENCOUNTER — Other Ambulatory Visit: Payer: Self-pay | Admitting: Family Medicine

## 2011-04-09 ENCOUNTER — Ambulatory Visit: Payer: Medicare Other | Admitting: Family Medicine

## 2011-04-12 ENCOUNTER — Other Ambulatory Visit: Payer: Self-pay | Admitting: Family Medicine

## 2011-05-14 ENCOUNTER — Other Ambulatory Visit: Payer: Self-pay | Admitting: Family Medicine

## 2011-05-14 DIAGNOSIS — Z1231 Encounter for screening mammogram for malignant neoplasm of breast: Secondary | ICD-10-CM

## 2011-06-03 ENCOUNTER — Ambulatory Visit (INDEPENDENT_AMBULATORY_CARE_PROVIDER_SITE_OTHER): Payer: Medicare Other | Admitting: Family Medicine

## 2011-06-03 ENCOUNTER — Encounter: Payer: Self-pay | Admitting: Family Medicine

## 2011-06-03 DIAGNOSIS — Z9889 Other specified postprocedural states: Secondary | ICD-10-CM

## 2011-06-03 DIAGNOSIS — E785 Hyperlipidemia, unspecified: Secondary | ICD-10-CM

## 2011-06-03 DIAGNOSIS — E119 Type 2 diabetes mellitus without complications: Secondary | ICD-10-CM

## 2011-06-03 DIAGNOSIS — Z906 Acquired absence of other parts of urinary tract: Secondary | ICD-10-CM

## 2011-06-03 DIAGNOSIS — I10 Essential (primary) hypertension: Secondary | ICD-10-CM

## 2011-06-03 LAB — BASIC METABOLIC PANEL
BUN: 25 mg/dL — ABNORMAL HIGH (ref 6–23)
CO2: 27 mEq/L (ref 19–32)
Chloride: 104 mEq/L (ref 96–112)
Creatinine, Ser: 0.8 mg/dL (ref 0.4–1.2)
Potassium: 3.9 mEq/L (ref 3.5–5.1)

## 2011-06-03 MED ORDER — TETANUS-DIPHTH-ACELL PERTUSSIS 5-2.5-18.5 LF-MCG/0.5 IM SUSP: 0.5000 mL | Freq: Once | INTRAMUSCULAR | Status: DC

## 2011-06-03 NOTE — Progress Notes (Signed)
  Subjective:    Patient ID: Jordan [name], female    DOB: 02/25/1946, 65 y.o.   MRN: [id number]  HPI  Medical followup. Patient has history of type 2 diabetes, cerebral palsy, hypertension, hyperlipidemia. Recent fasting blood sugars 130-150. Last A1c 7.2%. Takes metformin 1000 mg the morning and 500 mg at night. No symptoms of diarrhea. No symptoms of hyperglycemia.  Hypertension treated with losartan and furosemide. Chronic lower extremity edema stable. Uses support hose regularly. No orthostasis.  Hyperlipidemia treated with simvastatin 40 mg daily. No myalgias.  Patient requesting Tdap.  She had tetanus back in 2006 but was fairly certain this did not contain acellular pertussis vaccine.  Past Medical History  Diagnosis Date  . HYPERLIPIDEMIA 11/29/2009  . HYPERTENSION 11/29/2009  . COLONIC POLYPS 07/11/2010  . CEREBRAL PALSY 07/11/2010  . DIABETES MELLITUS, TYPE I 11/29/2009  . Type II or unspecified type diabetes mellitus without mention of complication, not stated as uncontrolled     Dr. Evelena Peat   Past Surgical History  Procedure Date  . Appendectomy   . Cholecystectomy   . Tonsillectomy   . Revision urostomy cutaneous 1992  . Abdominal hysterectomy 1990    partial  . Radical cystectomy 1992    neurogenic bladder sec CP    reports that she has never smoked. She does not have any smokeless tobacco history on file. Her alcohol and drug histories not on file. family history includes Cancer in her father; Emphysema in her father; Heart disease in her father; and Heart disease (age of onset:70) in her mother. Allergies  Allergen Reactions  . Baclofen     GI upset  . Lisinopril Cough  . Metoclopramide Hcl     Reglan, irritable  . Morphine   . Penicillins       Review of Systems  Constitutional: Negative for fatigue.  Eyes: Negative for visual disturbance.  Respiratory: Negative for cough, chest tightness, shortness of breath and wheezing.   Cardiovascular:  Negative for chest pain, palpitations and leg swelling.  Neurological: Negative for dizziness, seizures, syncope, weakness, light-headedness and headaches.       Objective:   Physical Exam  Constitutional: She is oriented to person, place, and time. She appears well-developed and well-nourished. No distress.  HENT:  Right Ear: External ear normal.  Left Ear: External ear normal.  Mouth/Throat: Oropharynx is clear and moist.  Neck: Neck supple. No thyromegaly present.  Cardiovascular: Normal rate and regular rhythm.   No murmur heard. Pulmonary/Chest: Effort normal and breath sounds normal. No respiratory distress. She has no wheezes. She has no rales.  Musculoskeletal: She exhibits no edema.       Support hose  Lymphadenopathy:    She has no cervical adenopathy.  Neurological: She is alert and oriented to person, place, and time.          Assessment & Plan:   #1 type 2 diabetes. History fair control. Recheck A1c #2 hypertension on chronic furosemide 80 mg. Check basic metabolic panel #3 health maintenance.  Tdap given #4 hyperlipidemia-previous lipids reviewed with patient.

## 2011-06-04 ENCOUNTER — Other Ambulatory Visit: Payer: Self-pay | Admitting: Family Medicine

## 2011-06-04 ENCOUNTER — Encounter: Payer: Self-pay | Admitting: *Deleted

## 2011-06-04 ENCOUNTER — Encounter: Payer: Medicare Other | Attending: Family Medicine | Admitting: *Deleted

## 2011-06-04 DIAGNOSIS — I10 Essential (primary) hypertension: Secondary | ICD-10-CM | POA: Insufficient documentation

## 2011-06-04 DIAGNOSIS — Z713 Dietary counseling and surveillance: Secondary | ICD-10-CM | POA: Insufficient documentation

## 2011-06-04 DIAGNOSIS — E119 Type 2 diabetes mellitus without complications: Secondary | ICD-10-CM | POA: Insufficient documentation

## 2011-06-04 NOTE — Patient Instructions (Addendum)
Goals:   Continue previous nutrition goals.  Try an alternate starch for night snack to see if morning blood glucose lowers.  Have largest meal for lunch instead of dinner.   Call or email with any questions or concerns.   Have a Happy Holiday! :)

## 2011-06-04 NOTE — Progress Notes (Signed)
Quick Note:  Pt informed on home VM, future labs ordered ______

## 2011-06-04 NOTE — Progress Notes (Signed)
Medical Nutrition Therapy:  Appt start time: 0930  End time:  1015.  Primary concerns today: T2DM, follow up.   Pt here for 3 month f/u with a recent A1c of 7.2% (maintained from previous). Reports recent elevation in FBGs (162 mg/dL), though has been suffering a sinus cold and taking Mucinex/Anti-histamines for several days.  Night before elevation, pt consumed dinner of baked chicken, sm amount of white rice, and broccoli/cauliflower.  Previously, a.m. FBGs running 150 mg/dL after hs snack of cheese and crackers.  After eliminating hs snack, FBG only down to 130-140 mg/dL.  Reports BG prior to lunch of 115-120 mg/dL.  Pt waiting to hear from MD on medication options. Pt to schedule next f/u after speaking with MD.   MEDICATIONS: See medication list; reconciled with pt  DIETARY INTAKE:  Pt continues to make healthy choices, though eats out often.   Recent physical activity: Same as previous - 3x/wk at gym on stair-stepper and strength training; exercise band at home. No hand cycle at home d/t tennis elbow.  Estimated energy needs: 1500 calories 165-170 g carbohydrates 110 g protein 40 g fat  Progress Towards Goal(s):  In progress.   Nutritional Diagnosis:  Hi-Nella-2.1 Impaired nutrient utilization related to glucose metabolism as evidenced by A1c of 7.2% and MD referral for DM assessment and education.    Intervention/Goals:   Continue previous nutrition goals.  Try an alternate starch for night snack to see if morning blood glucose lowers.  Have largest meal for lunch instead of dinner.   Call or email with any questions or concerns.   Have a Happy Holiday! :)  Monitoring/Evaluation:  Dietary intake, exercise, A1c (as available), BG trends, and body weight TBD - pt to call for appointment after speaking with MD about recent labs.  Chart purged 12.21.12 - SH

## 2011-06-12 ENCOUNTER — Ambulatory Visit
Admission: RE | Admit: 2011-06-12 | Discharge: 2011-06-12 | Disposition: A | Discharge status: Home / Self Care | Payer: Medicare Other | Source: Ambulatory Visit | Attending: Family Medicine | Admitting: Family Medicine

## 2011-06-12 DIAGNOSIS — Z1231 Encounter for screening mammogram for malignant neoplasm of breast: Secondary | ICD-10-CM

## 2011-06-20 ENCOUNTER — Ambulatory Visit (INDEPENDENT_AMBULATORY_CARE_PROVIDER_SITE_OTHER): Payer: Medicare Other | Admitting: Family Medicine

## 2011-06-20 ENCOUNTER — Encounter: Payer: Self-pay | Admitting: Family Medicine

## 2011-06-20 ENCOUNTER — Other Ambulatory Visit: Payer: Medicare Other

## 2011-06-20 VITALS — BP 130/70 | Temp 97.8°F

## 2011-06-20 DIAGNOSIS — R05 Cough: Secondary | ICD-10-CM

## 2011-06-20 DIAGNOSIS — J209 Acute bronchitis, unspecified: Secondary | ICD-10-CM

## 2011-06-20 MED ORDER — BENZONATATE 200 MG PO CAPS: 200.0000 mg | ORAL_CAPSULE | Freq: Three times a day (TID) | ORAL | Status: AC | PRN

## 2011-06-20 NOTE — Progress Notes (Signed)
  Subjective:    Patient ID: Jordan [name], female    DOB: [birth year], 66 y.o.   MRN: [id number]  HPI  Nonsmoker seen with cough. Onset about 2 weeks ago. Went to urgent care little over one week ago prescribed Zithromax and Tessalon Perles.  No chest x-ray done. Initially had low-grade fever along with nasal congestion and productive cough. Still has occasional productive cough. No dyspnea. No wheezing. Denies any fever or chills at this time. Overall feels improved. Tessalon helps cough somewhat but she only has one tablet left.   Review of Systems  Constitutional: Negative for fever, chills, appetite change and unexpected weight change.  HENT: Positive for congestion.   Respiratory: Positive for cough. Negative for shortness of breath and wheezing.   Cardiovascular: Negative for chest pain.       Objective:   Physical Exam  Constitutional: She appears well-developed and well-nourished. No distress.  HENT:  Nose: Nose normal.  Mouth/Throat: Oropharynx is clear and moist.  Neck: Neck supple.  Cardiovascular: Normal rate and regular rhythm.   Pulmonary/Chest: Effort normal and breath sounds normal. No respiratory distress. She has no wheezes. She has no rales.  Lymphadenopathy:    She has no cervical adenopathy.          Assessment & Plan:  Cough probably secondary to acute viral bronchitis. Normal lung exam. Refill Tessalon Perles 200 mg every 8 hours.  Touch base in 2 weeks if cough not resolved

## 2011-06-20 NOTE — Patient Instructions (Signed)
Follow up promptly for any fever or if still coughing in 2 weeks.

## 2011-06-23 ENCOUNTER — Other Ambulatory Visit: Payer: Self-pay | Admitting: *Deleted

## 2011-06-23 MED ORDER — LOSARTAN POTASSIUM 100 MG PO TABS: 100.0000 mg | ORAL_TABLET | Freq: Every day | ORAL | Status: DC

## 2011-06-24 ENCOUNTER — Telehealth: Payer: Self-pay | Admitting: Family Medicine

## 2011-06-24 MED ORDER — LOSARTAN POTASSIUM 100 MG PO TABS: 100.0000 mg | ORAL_TABLET | Freq: Every day | ORAL | Status: DC

## 2011-06-24 NOTE — Telephone Encounter (Signed)
Pt requesting refill on losartan (COZAAR) 100 MG tablet    [facility] (needs to change in system no longer using [facility])

## 2011-07-01 ENCOUNTER — Other Ambulatory Visit (INDEPENDENT_AMBULATORY_CARE_PROVIDER_SITE_OTHER): Payer: Medicare Other

## 2011-07-01 LAB — HEPATIC FUNCTION PANEL
AST: 14 U/L (ref 0–37)
Albumin: 3.6 g/dL (ref 3.5–5.2)

## 2011-07-01 LAB — BASIC METABOLIC PANEL
BUN: 19 mg/dL (ref 6–23)
Calcium: 9.7 mg/dL (ref 8.4–10.5)
Creatinine, Ser: 0.8 mg/dL (ref 0.4–1.2)
GFR: 72.86 mL/min (ref >60.00)
Glucose, Bld: 128 mg/dL — ABNORMAL HIGH (ref 70–99)

## 2011-07-03 NOTE — Progress Notes (Signed)
Quick Note:  Pt informed ______ 

## 2011-08-19 ENCOUNTER — Telehealth: Payer: Self-pay | Admitting: Family Medicine

## 2011-08-19 NOTE — Telephone Encounter (Signed)
Patient called stating that she need to know the results of her PPD done 01/07/11. Please assist and inform patient of results.

## 2011-08-19 NOTE — Telephone Encounter (Signed)
Pt was here on 01/07/2011 for physical and to fill out form, PPD was placed and is in report of immunizations (not found in historical?)  Pt did return for PPD read on 7/26, negative in left forearm.  Form completed, original given to pt, copy scanned to chart.  I will provide copy of form for pt to pick-up and show potential employer.

## 2011-09-01 ENCOUNTER — Other Ambulatory Visit: Payer: Self-pay | Admitting: *Deleted

## 2011-09-01 MED ORDER — FUROSEMIDE 80 MG PO TABS: 80.0000 mg | ORAL_TABLET | Freq: Two times a day (BID) | ORAL | Status: DC

## 2011-09-05 ENCOUNTER — Other Ambulatory Visit: Payer: Self-pay | Admitting: Urology

## 2011-09-05 DIAGNOSIS — N201 Calculus of ureter: Secondary | ICD-10-CM

## 2011-09-17 ENCOUNTER — Encounter (HOSPITAL_COMMUNITY): Payer: Self-pay | Admitting: Pharmacy Technician

## 2011-09-22 ENCOUNTER — Other Ambulatory Visit (HOSPITAL_COMMUNITY): Payer: Self-pay | Admitting: Physician Assistant

## 2011-09-23 ENCOUNTER — Other Ambulatory Visit: Payer: Self-pay | Admitting: Radiology

## 2011-09-25 ENCOUNTER — Ambulatory Visit (HOSPITAL_COMMUNITY)
Admission: RE | Admit: 2011-09-25 | Discharge: 2011-09-25 | Disposition: A | Discharge status: Home / Self Care | Payer: Medicare Other | Source: Ambulatory Visit | Attending: Urology | Admitting: Urology

## 2011-09-25 ENCOUNTER — Encounter (HOSPITAL_COMMUNITY)
Admission: RE | Admit: 2011-09-25 | Discharge: 2011-09-25 | Disposition: A | Discharge status: Home / Self Care | Payer: Medicare Other | Source: Ambulatory Visit | Attending: Urology | Admitting: Urology

## 2011-09-25 ENCOUNTER — Encounter (HOSPITAL_COMMUNITY): Payer: Self-pay

## 2011-09-25 DIAGNOSIS — Z01818 Encounter for other preprocedural examination: Secondary | ICD-10-CM | POA: Insufficient documentation

## 2011-09-25 DIAGNOSIS — Z01812 Encounter for preprocedural laboratory examination: Secondary | ICD-10-CM | POA: Insufficient documentation

## 2011-09-25 HISTORY — DX: Gastro-esophageal reflux disease without esophagitis: K21.9

## 2011-09-25 HISTORY — DX: Other specified postprocedural states: Z98.890

## 2011-09-25 HISTORY — DX: Unspecified osteoarthritis, unspecified site: M19.90

## 2011-09-25 HISTORY — DX: Nausea with vomiting, unspecified: R11.2

## 2011-09-25 HISTORY — DX: Encounter for other specified aftercare: Z51.89

## 2011-09-25 HISTORY — DX: Incisional hernia without obstruction or gangrene: K43.2

## 2011-09-25 LAB — CBC
MCHC: 31.7 g/dL (ref 30.0–36.0)
RDW: 14.3 % (ref 11.5–15.5)
WBC: 6.8 10*3/uL (ref 4.0–10.5)

## 2011-09-25 LAB — PROTIME-INR
INR: 0.91 (ref 0.00–1.49)
Prothrombin Time: 12.4 seconds (ref 11.6–15.2)

## 2011-09-25 LAB — BASIC METABOLIC PANEL
CO2: 28 mEq/L (ref 19–32)
Calcium: 10.8 mg/dL — ABNORMAL HIGH (ref 8.4–10.5)
GFR calc non Af Amer: 88 mL/min — ABNORMAL LOW (ref >90)
Potassium: 3.9 mEq/L (ref 3.5–5.1)
Sodium: 139 mEq/L (ref 135–145)

## 2011-09-25 LAB — SURGICAL PCR SCREEN
MRSA, PCR: NEGATIVE
Staphylococcus aureus: NEGATIVE

## 2011-09-25 NOTE — Pre-Procedure Instructions (Signed)
CBC, BMET, PT, EKG AND CXR WERE DONE TODAY AT [facility].

## 2011-09-25 NOTE — Patient Instructions (Addendum)
YOUR SURGERY IS SCHEDULED ON: WED 4/17  AT 11:00 AM  REPORT TO [facility] AT: 7:15 AM      PHONE # FOR RADIOLOGY  [phone]  DO NOT EAT OR DRINK ANYTHING AFTER MIDNIGHT THE NIGHT BEFORE YOUR SURGERY.  YOU MAY BRUSH YOUR TEETH, RINSE OUT YOUR MOUTH--BUT NO WATER, NO FOOD, NO CHEWING GUM, NO MINTS, NO CANDIES, NO CHEWING TOBACCO.  PLEASE TAKE THE FOLLOWING MEDICATIONS THE AM OF YOUR SURGERY WITH A FEW SIPS OF WATER: ZYRTEC    IF YOU USE INHALERS--USE YOUR INHALERS THE AM OF YOUR SURGERY AND BRING INHALERS TO THE HOSPITAL -TAKE TO SURGERY.    IF YOU ARE DIABETIC:  DO NOT TAKE ANY DIABETIC MEDICATIONS THE AM OF YOUR SURGERY.  IF YOU TAKE INSULIN IN THE EVENINGS--PLEASE ONLY TAKE 1/2 NORMAL EVENING DOSE THE NIGHT BEFORE YOUR SURGERY.  NO INSULIN THE AM OF YOUR SURGERY.  IF YOU HAVE SLEEP APNEA AND USE CPAP OR BIPAP--PLEASE BRING THE MASK --NOT THE MACHINE-NOT THE TUBING   -JUST THE MASK. DO NOT BRING VALUABLES, MONEY, CREDIT CARDS.  CONTACT LENS, DENTURES / PARTIALS, GLASSES SHOULD NOT BE WORN TO SURGERY AND IN MOST CASES-HEARING AIDS WILL NEED TO BE REMOVED.  BRING YOUR GLASSES CASE, ANY EQUIPMENT NEEDED FOR YOUR CONTACT LENS. FOR PATIENTS ADMITTED TO THE HOSPITAL--CHECK OUT TIME THE DAY OF DISCHARGE IS 11:00 AM.  ALL INPATIENT ROOMS ARE PRIVATE - WITH BATHROOM, TELEPHONE, TELEVISION AND WIFI INTERNET. IF YOU ARE BEING DISCHARGED THE SAME DAY OF YOUR SURGERY--YOU CAN NOT DRIVE YOURSELF HOME--AND SHOULD NOT GO HOME ALONE BY TAXI OR BUS.  NO DRIVING OR OPERATING MACHINERY FOR 24 HOURS FOLLOWING ANESTHESIA / PAIN MEDICATIONS.                            SPECIAL INSTRUCTIONS:  CHLORHEXIDINE SOAP SHOWER (other brand names are Betasept and Hibiclens ) PLEASE SHOWER WITH CHLORHEXIDINE THE NIGHT BEFORE YOUR SURGERY AND THE AM OF YOUR SURGERY. DO NOT USE CHLORHEXIDINE ON YOUR FACE OR PRIVATE AREAS--YOU MAY USE YOUR NORMAL SOAP THOSE AREAS AND YOUR NORMAL SHAMPOO.  WOMEN SHOULD AVOID  SHAVING UNDER ARMS AND SHAVING LEGS 48 HOURS BEFORE USING CHLORHEXIDINE TO AVOID SKIN IRRITATION.  DO NOT USE IF ALLERGIC TO CHLORHEXIDINE.  PLEASE READ OVER ANY  FACT SHEETS THAT YOU WERE GIVEN: MRSA INFORMATION

## 2011-09-26 ENCOUNTER — Other Ambulatory Visit: Payer: Self-pay | Admitting: Radiology

## 2011-09-26 ENCOUNTER — Encounter (HOSPITAL_COMMUNITY): Payer: Self-pay | Admitting: Pharmacy Technician

## 2011-09-30 ENCOUNTER — Telehealth (HOSPITAL_COMMUNITY): Payer: Self-pay | Admitting: Urology

## 2011-09-30 ENCOUNTER — Other Ambulatory Visit: Payer: Self-pay | Admitting: Radiology

## 2011-10-01 ENCOUNTER — Encounter (HOSPITAL_COMMUNITY): Payer: Self-pay | Admitting: *Deleted

## 2011-10-01 ENCOUNTER — Ambulatory Visit (HOSPITAL_COMMUNITY)
Admission: RE | Admit: 2011-10-01 | Discharge: 2011-10-01 | Disposition: A | Discharge status: Home / Self Care | Payer: Medicare Other | Source: Ambulatory Visit | Attending: Urology | Admitting: Urology

## 2011-10-01 ENCOUNTER — Other Ambulatory Visit: Payer: Self-pay | Admitting: Urology

## 2011-10-01 ENCOUNTER — Encounter (HOSPITAL_COMMUNITY): Payer: Self-pay | Admitting: Registered Nurse

## 2011-10-01 ENCOUNTER — Ambulatory Visit (HOSPITAL_COMMUNITY): Payer: Medicare Other

## 2011-10-01 ENCOUNTER — Inpatient Hospital Stay (HOSPITAL_COMMUNITY)
Admission: RE | Admit: 2011-10-01 | Discharge: 2011-10-04 | DRG: 661 | Disposition: A | Discharge status: Home / Self Care | Payer: Medicare Other | Source: Ambulatory Visit | Attending: Urology | Admitting: Urology

## 2011-10-01 ENCOUNTER — Encounter (HOSPITAL_COMMUNITY)
Admission: RE | Disposition: A | Discharge status: Home / Self Care | Payer: Self-pay | Source: Ambulatory Visit | Attending: Urology

## 2011-10-01 ENCOUNTER — Encounter (HOSPITAL_COMMUNITY): Payer: Self-pay

## 2011-10-01 ENCOUNTER — Ambulatory Visit (HOSPITAL_COMMUNITY): Payer: Medicare Other | Admitting: Registered Nurse

## 2011-10-01 VITALS — BP 146/73 | HR 82 | Temp 98.7°F | Resp 15 | Ht 69.0 in | Wt 192.0 lb

## 2011-10-01 DIAGNOSIS — N201 Calculus of ureter: Secondary | ICD-10-CM

## 2011-10-01 DIAGNOSIS — Z936 Other artificial openings of urinary tract status: Secondary | ICD-10-CM

## 2011-10-01 DIAGNOSIS — E119 Type 2 diabetes mellitus without complications: Secondary | ICD-10-CM | POA: Diagnosis present

## 2011-10-01 DIAGNOSIS — E785 Hyperlipidemia, unspecified: Secondary | ICD-10-CM | POA: Diagnosis present

## 2011-10-01 DIAGNOSIS — K219 Gastro-esophageal reflux disease without esophagitis: Secondary | ICD-10-CM | POA: Diagnosis present

## 2011-10-01 DIAGNOSIS — Z906 Acquired absence of other parts of urinary tract: Secondary | ICD-10-CM

## 2011-10-01 DIAGNOSIS — G809 Cerebral palsy, unspecified: Secondary | ICD-10-CM | POA: Diagnosis present

## 2011-10-01 DIAGNOSIS — I10 Essential (primary) hypertension: Secondary | ICD-10-CM | POA: Diagnosis present

## 2011-10-01 HISTORY — PX: NEPHROLITHOTOMY: SHX5134

## 2011-10-01 LAB — GLUCOSE, CAPILLARY

## 2011-10-01 SURGERY — NEPHROLITHOTOMY PERCUTANEOUS
Anesthesia: General | Laterality: Right | Wound class: Clean Contaminated

## 2011-10-01 MED ORDER — NEOSTIGMINE METHYLSULFATE 1 MG/ML IJ SOLN
INTRAMUSCULAR | Status: DC | PRN
  Administered 2011-10-01: 5 mg via INTRAVENOUS

## 2011-10-01 MED ORDER — SIMVASTATIN 40 MG PO TABS
40.0000 mg | ORAL_TABLET | Freq: Every evening | ORAL | Status: DC
  Administered 2011-10-01 – 2011-10-03 (×3): 40 mg via ORAL
  Filled 2011-10-01 (×4): qty 1

## 2011-10-01 MED ORDER — HYDROMORPHONE 0.3 MG/ML IV SOLN
INTRAVENOUS | Status: DC
  Administered 2011-10-01: 1.2 mg via INTRAVENOUS
  Administered 2011-10-01: 15:00:00 via INTRAVENOUS
  Administered 2011-10-02 (×2): 0.6 mg via INTRAVENOUS

## 2011-10-01 MED ORDER — POLYETHYLENE GLYCOL 3350 17 GM/SCOOP PO POWD
17.0000 g | Freq: Every day | ORAL | Status: DC
  Filled 2011-10-01: qty 255

## 2011-10-01 MED ORDER — CIPROFLOXACIN IN D5W 400 MG/200ML IV SOLN
INTRAVENOUS | Status: AC
  Filled 2011-10-01: qty 200

## 2011-10-01 MED ORDER — DIPHENHYDRAMINE HCL 12.5 MG/5ML PO ELIX: 12.5000 mg | ORAL_SOLUTION | Freq: Four times a day (QID) | ORAL | Status: DC | PRN

## 2011-10-01 MED ORDER — FENTANYL CITRATE 0.05 MG/ML IJ SOLN
INTRAMUSCULAR | Status: DC | PRN
  Administered 2011-10-01 (×2): 50 ug via INTRAVENOUS

## 2011-10-01 MED ORDER — CIPROFLOXACIN IN D5W 400 MG/200ML IV SOLN
400.0000 mg | Freq: Once | INTRAVENOUS | Status: AC
  Administered 2011-10-01: 400 mg via INTRAVENOUS

## 2011-10-01 MED ORDER — VITAMIN D3 25 MCG (1000 UNIT) PO TABS
2000.0000 [IU] | ORAL_TABLET | Freq: Every day | ORAL | Status: DC
  Administered 2011-10-01 – 2011-10-03 (×3): 2000 [IU] via ORAL
  Filled 2011-10-01 (×4): qty 2

## 2011-10-01 MED ORDER — POLYETHYLENE GLYCOL 3350 17 G PO PACK
17.0000 g | PACK | Freq: Every day | ORAL | Status: DC
  Administered 2011-10-01 – 2011-10-03 (×3): 17 g via ORAL
  Filled 2011-10-01 (×4): qty 1

## 2011-10-01 MED ORDER — MIDAZOLAM HCL 2 MG/2ML IJ SOLN
INTRAMUSCULAR | Status: AC
  Filled 2011-10-01: qty 4

## 2011-10-01 MED ORDER — NALOXONE HCL 0.4 MG/ML IJ SOLN: 0.4000 mg | INTRAMUSCULAR | Status: DC | PRN

## 2011-10-01 MED ORDER — FUROSEMIDE 80 MG PO TABS
80.0000 mg | ORAL_TABLET | Freq: Every day | ORAL | Status: DC | PRN
  Filled 2011-10-01: qty 1

## 2011-10-01 MED ORDER — DOCUSATE SODIUM 100 MG PO CAPS
100.0000 mg | ORAL_CAPSULE | Freq: Two times a day (BID) | ORAL | Status: DC
  Administered 2011-10-01 – 2011-10-03 (×4): 100 mg via ORAL
  Filled 2011-10-01 (×7): qty 1

## 2011-10-01 MED ORDER — LIDOCAINE HCL (CARDIAC) 20 MG/ML IV SOLN
INTRAVENOUS | Status: DC | PRN
  Administered 2011-10-01: 80 mg via INTRAVENOUS

## 2011-10-01 MED ORDER — SODIUM CHLORIDE 0.9 % IR SOLN
Status: DC | PRN
  Administered 2011-10-01: 7000 mL

## 2011-10-01 MED ORDER — DIPHENHYDRAMINE HCL 50 MG/ML IJ SOLN: 12.5000 mg | Freq: Four times a day (QID) | INTRAMUSCULAR | Status: DC | PRN

## 2011-10-01 MED ORDER — FENTANYL CITRATE 0.05 MG/ML IJ SOLN
INTRAMUSCULAR | Status: AC | PRN
  Administered 2011-10-01: 50 ug via INTRAVENOUS
  Administered 2011-10-01 (×2): 100 ug via INTRAVENOUS

## 2011-10-01 MED ORDER — MEPERIDINE HCL 50 MG/ML IJ SOLN
INTRAMUSCULAR | Status: AC
  Administered 2011-10-01: 12.5 mg via INTRAVENOUS
  Filled 2011-10-01: qty 1

## 2011-10-01 MED ORDER — IOHEXOL 300 MG/ML  SOLN: 10.0000 mL | Freq: Once | INTRAMUSCULAR | Status: DC | PRN

## 2011-10-01 MED ORDER — GUAIFENESIN ER 600 MG PO TB12
600.0000 mg | ORAL_TABLET | Freq: Two times a day (BID) | ORAL | Status: DC | PRN
  Filled 2011-10-01: qty 1

## 2011-10-01 MED ORDER — PROPOFOL 10 MG/ML IV EMUL
INTRAVENOUS | Status: DC | PRN
  Administered 2011-10-01: 150 mg via INTRAVENOUS

## 2011-10-01 MED ORDER — GLYCOPYRROLATE 0.2 MG/ML IJ SOLN
INTRAMUSCULAR | Status: DC | PRN
  Administered 2011-10-01: .8 mg via INTRAVENOUS

## 2011-10-01 MED ORDER — MIDAZOLAM HCL 5 MG/5ML IJ SOLN
INTRAMUSCULAR | Status: DC | PRN
  Administered 2011-10-01: 1 mg via INTRAVENOUS

## 2011-10-01 MED ORDER — MEPERIDINE HCL 50 MG/ML IJ SOLN
6.2500 mg | INTRAMUSCULAR | Status: DC | PRN
  Administered 2011-10-01: 12.5 mg via INTRAVENOUS

## 2011-10-01 MED ORDER — HYDROMORPHONE 0.3 MG/ML IV SOLN
INTRAVENOUS | Status: AC
  Administered 2011-10-01: 0.3 mL
  Filled 2011-10-01: qty 25

## 2011-10-01 MED ORDER — LACTATED RINGERS IV SOLN
INTRAVENOUS | Status: DC
  Administered 2011-10-01: 1000 mL via INTRAVENOUS

## 2011-10-01 MED ORDER — CIPROFLOXACIN IN D5W 400 MG/200ML IV SOLN: 400.0000 mg | INTRAVENOUS | Status: DC

## 2011-10-01 MED ORDER — METFORMIN HCL 500 MG PO TABS
500.0000 mg | ORAL_TABLET | Freq: Two times a day (BID) | ORAL | Status: DC
  Filled 2011-10-01: qty 2

## 2011-10-01 MED ORDER — ACETAMINOPHEN 500 MG PO TABS: 1000.0000 mg | ORAL_TABLET | Freq: Four times a day (QID) | ORAL | Status: DC | PRN

## 2011-10-01 MED ORDER — ONDANSETRON HCL 4 MG/2ML IJ SOLN
INTRAMUSCULAR | Status: DC | PRN
  Administered 2011-10-01: 4 mg via INTRAVENOUS

## 2011-10-01 MED ORDER — FENTANYL CITRATE 0.05 MG/ML IJ SOLN
25.0000 ug | INTRAMUSCULAR | Status: DC | PRN
  Administered 2011-10-01 (×2): 50 ug via INTRAVENOUS

## 2011-10-01 MED ORDER — ACCU-CHEK SOFT TOUCH LANCETS MISC: 1.0000 | Status: DC | PRN

## 2011-10-01 MED ORDER — IOHEXOL 300 MG/ML  SOLN
INTRAMUSCULAR | Status: AC
  Filled 2011-10-01: qty 2

## 2011-10-01 MED ORDER — GLUCOSE BLOOD VI STRP: 1.0000 | ORAL_STRIP | Status: DC | PRN

## 2011-10-01 MED ORDER — SODIUM CHLORIDE 0.9 % IJ SOLN: 9.0000 mL | INTRAMUSCULAR | Status: DC | PRN

## 2011-10-01 MED ORDER — VITAMIN D 50 MCG (2000 UT) PO CAPS: 2000.0000 [IU] | ORAL_CAPSULE | Freq: Every day | ORAL | Status: DC

## 2011-10-01 MED ORDER — FENTANYL CITRATE 0.05 MG/ML IJ SOLN
INTRAMUSCULAR | Status: AC
  Filled 2011-10-01: qty 4

## 2011-10-01 MED ORDER — FENTANYL CITRATE 0.05 MG/ML IJ SOLN
INTRAMUSCULAR | Status: AC
  Filled 2011-10-01: qty 2

## 2011-10-01 MED ORDER — LOSARTAN POTASSIUM 50 MG PO TABS
100.0000 mg | ORAL_TABLET | Freq: Every day | ORAL | Status: DC
  Administered 2011-10-02: 100 mg via ORAL
  Filled 2011-10-01 (×3): qty 2

## 2011-10-01 MED ORDER — ONDANSETRON HCL 4 MG/2ML IJ SOLN
4.0000 mg | Freq: Four times a day (QID) | INTRAMUSCULAR | Status: DC | PRN
  Administered 2011-10-01: 4 mg via INTRAVENOUS
  Filled 2011-10-01: qty 2

## 2011-10-01 MED ORDER — INSULIN ASPART 100 UNIT/ML ~~LOC~~ SOLN
0.0000 [IU] | SUBCUTANEOUS | Status: DC
Start: 2011-10-01 — End: 2011-10-01

## 2011-10-01 MED ORDER — IOHEXOL 300 MG/ML  SOLN
INTRAMUSCULAR | Status: DC | PRN
  Administered 2011-10-01: 100 mL

## 2011-10-01 MED ORDER — SODIUM CHLORIDE 0.45 % IV SOLN
INTRAVENOUS | Status: DC
  Administered 2011-10-01 – 2011-10-03 (×4): via INTRAVENOUS

## 2011-10-01 MED ORDER — ROCURONIUM BROMIDE 100 MG/10ML IV SOLN
INTRAVENOUS | Status: DC | PRN
  Administered 2011-10-01: 40 mg via INTRAVENOUS

## 2011-10-01 MED ORDER — MIDAZOLAM HCL 5 MG/5ML IJ SOLN
INTRAMUSCULAR | Status: AC | PRN
  Administered 2011-10-01 (×2): 1 mg via INTRAVENOUS
  Administered 2011-10-01: 2 mg via INTRAVENOUS

## 2011-10-01 MED ORDER — LEVOFLOXACIN IN D5W 500 MG/100ML IV SOLN
500.0000 mg | INTRAVENOUS | Status: DC
  Administered 2011-10-01 – 2011-10-03 (×3): 500 mg via INTRAVENOUS
  Filled 2011-10-01 (×4): qty 100

## 2011-10-01 MED ORDER — SALINE SPRAY 0.65 % NA SOLN: 1.0000 | NASAL | Status: DC | PRN

## 2011-10-01 MED ORDER — ZOLPIDEM TARTRATE 5 MG PO TABS: 5.0000 mg | ORAL_TABLET | Freq: Every evening | ORAL | Status: DC | PRN

## 2011-10-01 MED ORDER — MIDAZOLAM HCL 2 MG/2ML IJ SOLN
INTRAMUSCULAR | Status: AC
  Filled 2011-10-01: qty 2

## 2011-10-01 MED ORDER — INSULIN ASPART 100 UNIT/ML ~~LOC~~ SOLN
4.0000 [IU] | Freq: Three times a day (TID) | SUBCUTANEOUS | Status: DC
  Administered 2011-10-01 – 2011-10-03 (×3): 4 [IU] via SUBCUTANEOUS

## 2011-10-01 MED ORDER — INSULIN ASPART 100 UNIT/ML ~~LOC~~ SOLN
0.0000 [IU] | Freq: Three times a day (TID) | SUBCUTANEOUS | Status: DC
  Administered 2011-10-01 – 2011-10-02 (×2): 2 [IU] via SUBCUTANEOUS
  Administered 2011-10-02: 3 [IU] via SUBCUTANEOUS
  Administered 2011-10-03: 2 [IU] via SUBCUTANEOUS

## 2011-10-01 MED ORDER — FUROSEMIDE 80 MG PO TABS
80.0000 mg | ORAL_TABLET | Freq: Every day | ORAL | Status: DC
  Administered 2011-10-01 – 2011-10-03 (×3): 80 mg via ORAL
  Filled 2011-10-01 (×4): qty 1

## 2011-10-01 MED ORDER — SODIUM CHLORIDE 0.9 % IV SOLN
INTRAVENOUS | Status: DC
  Administered 2011-10-01: 08:00:00 via INTRAVENOUS

## 2011-10-01 MED ORDER — FUROSEMIDE 80 MG PO TABS
80.0000 mg | ORAL_TABLET | ORAL | Status: DC
Start: 2011-10-01 — End: 2011-10-01

## 2011-10-01 MED ORDER — METFORMIN HCL 500 MG PO TABS
500.0000 mg | ORAL_TABLET | Freq: Two times a day (BID) | ORAL | Status: DC
  Filled 2011-10-01 (×2): qty 2

## 2011-10-01 SURGICAL SUPPLY — 51 items
APL SKNCLS STERI-STRIP NONHPOA (GAUZE/BANDAGES/DRESSINGS) ×1
BAG URINE DRAINAGE (UROLOGICAL SUPPLIES) ×3 IMPLANT
BASKET ZERO TIP NITINOL 2.4FR (BASKET) ×2 IMPLANT
BENZOIN TINCTURE PRP APPL 2/3 (GAUZE/BANDAGES/DRESSINGS) ×3 IMPLANT
BLADE SURG 15 STRL LF DISP TIS (BLADE) ×1 IMPLANT
BLADE SURG 15 STRL SS (BLADE) ×2
BSKT STON RTRVL ZERO TP 2.4FR (BASKET) ×1
CATCHER STONE W/TUBE ADAPTER (UROLOGICAL SUPPLIES) ×1 IMPLANT
CATH FOLEY 2W COUNCIL 20FR 5CC (CATHETERS) IMPLANT
CATH FOLEY 2W COUNCIL 5CC 18FR (CATHETERS) ×1 IMPLANT
CATH INTERMIT  6FR 70CM (CATHETERS) ×1 IMPLANT
CATH ROBINSON RED A/P 20FR (CATHETERS) IMPLANT
CATH X-FORCE N30 NEPHROSTOMY (TUBING) ×2 IMPLANT
CLOTH BEACON ORANGE TIMEOUT ST (SAFETY) ×2 IMPLANT
COVER SURGICAL LIGHT HANDLE (MISCELLANEOUS) ×2 IMPLANT
DRAPE C-ARM 42X72 X-RAY (DRAPES) ×2 IMPLANT
DRAPE CAMERA CLOSED 9X96 (DRAPES) ×2 IMPLANT
DRAPE LINGEMAN PERC (DRAPES) ×2 IMPLANT
DRAPE SURG IRRIG POUCH 19X23 (DRAPES) ×2 IMPLANT
DRSG PAD ABDOMINAL 8X10 ST (GAUZE/BANDAGES/DRESSINGS) ×1 IMPLANT
DRSG TEGADERM 6X8 (GAUZE/BANDAGES/DRESSINGS) ×1 IMPLANT
DRSG TEGADERM 8X12 (GAUZE/BANDAGES/DRESSINGS) ×4 IMPLANT
GLOVE BIOGEL M 8.0 STRL (GLOVE) ×2 IMPLANT
GOWN STRL REIN XL XLG (GOWN DISPOSABLE) ×2 IMPLANT
GUIDEWIRE ANG ZIPWIRE 038X150 (WIRE) ×1 IMPLANT
GUIDEWIRE STR DUAL SENSOR (WIRE) ×1 IMPLANT
GUIDEWIRE SUPER STIFF (WIRE) ×1 IMPLANT
KIT BASIN OR (CUSTOM PROCEDURE TRAY) ×2 IMPLANT
LASER FIBER DISP (UROLOGICAL SUPPLIES) ×1 IMPLANT
LASER FIBER DISP 1000U (UROLOGICAL SUPPLIES) IMPLANT
MANIFOLD NEPTUNE II (INSTRUMENTS) ×2 IMPLANT
NS IRRIG 1000ML POUR BTL (IV SOLUTION) ×2 IMPLANT
PACK BASIC VI WITH GOWN DISP (CUSTOM PROCEDURE TRAY) ×2 IMPLANT
PAD ABD 7.5X8 STRL (GAUZE/BANDAGES/DRESSINGS) ×4 IMPLANT
PROBE LITHOCLAST ULTRA 3.8X403 (UROLOGICAL SUPPLIES) IMPLANT
PROBE PNEUMATIC 1.0MMX570MM (UROLOGICAL SUPPLIES) ×1 IMPLANT
SET IRRIG Y TYPE TUR BLADDER L (SET/KITS/TRAYS/PACK) ×1 IMPLANT
SET WARMING FLUID IRRIGATION (MISCELLANEOUS) ×2 IMPLANT
SPONGE GAUZE 4X4 12PLY (GAUZE/BANDAGES/DRESSINGS) ×2 IMPLANT
SPONGE LAP 4X18 X RAY DECT (DISPOSABLE) ×2 IMPLANT
STENT CONTOUR 6FRX28X.038 (STENTS) ×1 IMPLANT
STONE CATCHER W/TUBE ADAPTER (UROLOGICAL SUPPLIES) IMPLANT
SUT SILK 2 0 30  PSL (SUTURE) ×1
SUT SILK 2 0 30 PSL (SUTURE) ×1 IMPLANT
SYR 20CC LL (SYRINGE) ×4 IMPLANT
SYRINGE 10CC LL (SYRINGE) ×2 IMPLANT
TOWEL NATURAL 10PK STERILE (DISPOSABLE) ×1 IMPLANT
TRAY FOLEY BAG SILVER LF 14FR (CATHETERS) ×2 IMPLANT
TRAY FOLEY CATH 14FRSI W/METER (CATHETERS) ×1 IMPLANT
TUBING CONNECTING 10 (TUBING) ×5 IMPLANT
WATER STERILE IRR 1500ML POUR (IV SOLUTION) ×2 IMPLANT

## 2011-10-01 NOTE — Anesthesia Postprocedure Evaluation (Signed)
  Anesthesia Post-op Note  Patient: Jordan [name]  Procedure(s) Performed: Procedure(s) (LRB): NEPHROLITHOTOMY PERCUTANEOUS (Right)  Patient Location: PACU  Anesthesia Type: General  Level of Consciousness: oriented and sedated  Airway and Oxygen Therapy: Patient Spontanous Breathing and Patient connected to nasal cannula oxygen  Post-op Pain: mild  Post-op Assessment: Post-op Vital signs reviewed, Patient's Cardiovascular Status Stable, Respiratory Function Stable and Patent Airway  Post-op Vital Signs: stable  Complications: No apparent anesthesia complications

## 2011-10-01 NOTE — Anesthesia Preprocedure Evaluation (Addendum)
Anesthesia Evaluation  Patient identified by MRN, date of birth, ID band Patient awake    Reviewed: Allergy & Precautions, H&P , NPO status , Patient's Chart, lab work & pertinent test results, reviewed documented beta blocker date and time   History of Anesthesia Complications (+) PONV  Airway Mallampati: II TM Distance: >3 FB     Dental  (+) Dental Advisory Given   Pulmonary neg pulmonary ROS,  breath sounds clear to auscultation        Cardiovascular hypertension, Pt. on medications Rhythm:Regular Rate:Normal  Denies cardiac symptoms   Neuro/Psych negative neurological ROS  negative psych ROS   GI/Hepatic negative GI ROS, Neg liver ROS,   Endo/Other  Diabetes mellitus-, Well Controlled, Type 2, Oral Hypoglycemic Agents  Renal/GU Kidney stone-right  negative genitourinary   Musculoskeletal negative musculoskeletal ROS (+)   Abdominal   Peds negative pediatric ROS (+)  Hematology negative hematology ROS (+)   Anesthesia Other Findings Crowns in back  Reproductive/Obstetrics negative OB ROS                          Anesthesia Physical Anesthesia Plan  ASA: III  Anesthesia Plan: General   Post-op Pain Management:    Induction: Intravenous  Airway Management Planned: Oral ETT  Additional Equipment:   Intra-op Plan:   Post-operative Plan: Extubation in OR  Informed Consent: I have reviewed the patients History and Physical, chart, labs and discussed the procedure including the risks, benefits and alternatives for the proposed anesthesia with the patient or authorized representative who has indicated his/her understanding and acceptance.   Dental advisory given  Plan Discussed with: CRNA and Surgeon  Anesthesia Plan Comments:         Anesthesia Quick Evaluation

## 2011-10-01 NOTE — Transfer of Care (Signed)
Immediate Anesthesia Transfer of Care Note  Patient: Jordan [name]  Procedure(s) Performed: Procedure(s) (LRB): NEPHROLITHOTOMY PERCUTANEOUS (Right)  Patient Location: PACU  Anesthesia Type: General  Level of Consciousness: awake, alert , oriented and patient cooperative  Airway & Oxygen Therapy: Patient Spontanous Breathing and Patient connected to face mask oxygen  Post-op Assessment: Report given to PACU RN, Post -op Vital signs reviewed and stable and Patient moving all extremities  Post vital signs: Reviewed and stable  Complications: No apparent anesthesia complications

## 2011-10-01 NOTE — H&P (Signed)
H&P  Chief Complaint: Right sided kidney stone  History of Present Illness: Jordan [name] is a 66 y.o. year old female who now presents for endoscopic management of a persistent right ureteral calculus.   She became symptomatic about a month ago with pain, nausea. No fever or chills. She was initially seen yesterday by our mid-level, CT urogram revealed a 1 cm proximal right ureteral stone at the level of her sacrum. Stone is not visible on KUB.  Past Medical History  Diagnosis Date  . HYPERLIPIDEMIA 11/29/2009  . HYPERTENSION 11/29/2009  . COLONIC POLYPS 07/11/2010  . CEREBRAL PALSY 07/11/2010    PT CAN TRANSFER BED TO CHAIR--CAN STAND BRIEFLY--BUT NOT ABLE TO WALK; RT LEG HYPEREXTENDS BACKWARD AND IS WEAK-HAS A POWER CHAIR  . Diabetes mellitus     ORAL MED FOR DIABETES  . Blood transfusion 1992    AFTER  SURGERY TO REMOVE BLADDER  . GERD (gastroesophageal reflux disease)     PAST HISTORY GERD--WATCHES DIET - NOT ON ANY MEDS FOR GERD  . Incisional hernia     NO PAIN-NOT CAUSING ANY DISCOMFORT  . Arthritis     OA  . PONV (postoperative nausea and vomiting)     Past Surgical History  Procedure Date  . Appendectomy   . Cholecystectomy   . Tonsillectomy   . Revision urostomy cutaneous 1992  . Abdominal hysterectomy 1990    partial  . Radical cystectomy 1992    neurogenic bladder sec CP  . Rt ankle fusion 1975    SEVERAL RT ANKLE SURGERIES PRIOR TO THE FUSION  . Left carpal tunnel release x 2   . Right carpal tunnel release   . Lumbar sympathetectomies x 2   . Surgery to correct rt hip contracture--at [facility]      Home Medications:  Medications Prior to Admission  Medication Dose Route Frequency Provider Last Rate Last Dose  . ciprofloxacin (CIPRO) 400 MG/200ML IVPB           . fentaNYL (SUBLIMAZE) 0.05 MG/ML injection           . fentaNYL (SUBLIMAZE) 0.05 MG/ML injection           . midazolam (VERSED) 2 MG/2ML injection           . DISCONTD: midazolam (VERSED) 2 MG/2ML  injection            Medications Prior to Admission  Medication Sig Dispense Refill  . ACCU-CHEK SMARTVIEW test strip USE TO TEST TWICE DAILY AS DIRECTED  60 strip  11  . aspirin 81 MG tablet Take 81 mg by mouth every morning.       Marland Kitchen b complex vitamins capsule Take 1 capsule by mouth daily.       . Calcium Citrate-Vitamin D (CALCIUM CITRATE + PO) Take 1 tablet by mouth 2 (two) times daily.       . cetirizine (ZYRTEC) 10 MG tablet Take 10 mg by mouth daily.       . Cholecalciferol (VITAMIN D) 2000 UNITS CAPS Take 2,000 Units by mouth daily.       . furosemide (LASIX) 80 MG tablet Take 80 mg by mouth See admin instructions. 1 tablet EVERY  EVENING   AND  MAY TAKE 2nd dose THE NEXT AM- IF NEEDED      . guaiFENesin (MUCINEX) 600 MG 12 hr tablet Take 600 mg by mouth 2 (two) times daily as needed. Congestion       . Lancets (ACCU-CHEK SOFT TOUCH)  lancets Use as instructed  100 each  12  . losartan (COZAAR) 100 MG tablet Take 100 mg by mouth daily with breakfast.      . metFORMIN (GLUCOPHAGE) 500 MG tablet Take 500-1,000 mg by mouth 2 (two) times daily with a meal. 2 tabs in AM, 1 tab in PM      . Multiple Vitamin (MULTIVITAMIN) capsule Take 2 capsules by mouth daily.       . Omega-3 Fatty Acids (FISH OIL) 1200 MG CAPS Take 1,200 mg by mouth 2 (two) times daily.       . polyethylene glycol powder (GLYCOLAX/MIRALAX) powder Take 17 g by mouth daily. One capfull daily by mouth with liquid        Allergies:  Allergies  Allergen Reactions  . Morphine Anaphylaxis  . Baclofen     GI upset  . Lisinopril Cough  . Metoclopramide Hcl     Reglan, irritable  . Penicillins     " MAJOR CASE OF HIVES"    Family History  Problem Relation Age of Onset  . Heart disease Mother 55    COPD  . Heart disease Father     COPD   . Emphysema Father   . Cancer Father     bladder, colon    Social History:  reports that she has never smoked. She has never used smokeless tobacco. She reports that she does not  drink alcohol or use illicit drugs.  ROS: A complete review of systems was performed.  All systems are negative except for pertinent findings as noted. Intermittent right flank pain.  Physical Exam:  Vital signs in last 24 hours: Temp:  [98.7 F (37.1 C)] 98.7 F (37.1 C) (04/17 0804) Pulse Rate:  [79-95] 89  (04/17 0940) Resp:  [11-18] 11  (04/17 0940) BP: (138-176)/(52-82) 161/74 mmHg (04/17 0940) SpO2:  [95 %-100 %] 99 % (04/17 0940) Weight:  [87.091 kg (192 lb)] 87.091 kg (192 lb) (04/17 0804) General:  Alert and oriented, No acute distress HEENT: Normocephalic, atraumatic Neck: No JVD or lymphadenopathy Cardiovascular: Regular rate and rhythm Lungs: Clear bilaterally Abdomen: Soft, nontender, nondistended, no abdominal masses. Obese. RLQ urostomy. Back: No CVA tenderness Extremities: No edema Neurologic: Grossly intact  Laboratory Data:  Results for orders placed during the hospital encounter of 10/01/11 (from the past 24 hour(s))  GLUCOSE, CAPILLARY     Status: Abnormal   Collection Time   10/01/11  7:51 AM      Component Value Range   Glucose-Capillary 145 (*) 70 - 99 (mg/dL)   Recent Results (from the past 240 hour(s))  SURGICAL PCR SCREEN     Status: Normal   Collection Time   09/25/11 10:40 AM      Component Value Range Status Comment   MRSA, PCR NEGATIVE  NEGATIVE  Final    Staphylococcus aureus NEGATIVE  NEGATIVE  Final    Creatinine:  Basename 09/25/11 1120  CREATININE 0.70    Radiologic Imaging: No results found.  Impression/Assessment:  Persistent right ureteral calculus in a patient with a urinary diversion.  Plan:  Right ureteroscopic stone extraction through a percutaneous access.  [name] 10/01/2011, 9:42 AM  Bertram Millard. Leroy Trim MD

## 2011-10-01 NOTE — Progress Notes (Signed)
Post-op note  Subjective: The patient is doing well.  No complaints.  Objective: Vital signs in last 24 hours: Temp:  [98 F (36.7 C)-98.7 F (37.1 C)] 98.2 F (36.8 C) (04/17 1548) Pulse Rate:  [65-95] 80  (04/17 1548) Resp:  [11-19] 19  (04/17 1548) BP: (116-176)/(51-93) 137/70 mmHg (04/17 1548) SpO2:  [95 %-100 %] 98 % (04/17 1548) Weight:  [87.091 kg (192 lb)-114 kg (251 lb 5.2 oz)] 114 kg (251 lb 5.2 oz) (04/17 1548)  Intake/Output from previous day:   Intake/Output this shift: Total I/O In: 1600 [I.V.:1600] Out: 640 [Urine:590; Blood:50]  Physical Exam:  General: Alert and oriented. Abdomen: Soft, Nondistended. Incisions: Clean and dry.  Lab Results: No results found for this basename: HGB:3,HCT:3 in the last 72 hours  Assessment/Plan: POD#0   1) Continue to monitor   Bertram Millard. Glendon Fiser, MD   LOS: 0 days   [name] 10/01/2011, 5:57 PM

## 2011-10-01 NOTE — Progress Notes (Signed)
   CARE MANAGEMENT NOTE 10/01/2011  Patient:  Jordan [name], Jordan [name]   Account Number:  [id number]  Date Initiated:  10/01/2011  Documentation initiated by:  Lanier Clam  Subjective/Objective Assessment:   ADMITTED W/R SIDE KIDNEY STONE     Action/Plan:   FROM HOME W/SPOUSE   Anticipated DC Date:  10/03/2011   Anticipated DC Plan:  HOME/SELF CARE         Choice offered to / List presented to:             Status of service:  In process, will continue to follow Medicare Important Message given?   (If response is "NO", the following Medicare IM given date fields will be blank) Date Medicare IM given:   Date Additional Medicare IM given:    Discharge Disposition:    Per UR Regulation:  Reviewed for med. necessity/level of care/duration of stay  If discussed at Long Length of Stay Meetings, dates discussed:    Comments:  10/03/11 [facility] RN,BSN NCM [phone]

## 2011-10-01 NOTE — H&P (Signed)
Jordan [name] is an 66 y.o. female.   Chief Complaint: right ureteral stone  HPI: Patient with history of large right proximal /mid ureteral stone presents today for percutaneous nephrostomy prior to nephrolithotomy.  Past Medical History  Diagnosis Date  . HYPERLIPIDEMIA 11/29/2009  . HYPERTENSION 11/29/2009  . COLONIC POLYPS 07/11/2010  . CEREBRAL PALSY 07/11/2010    PT CAN TRANSFER BED TO CHAIR--CAN STAND BRIEFLY--BUT NOT ABLE TO WALK; RT LEG HYPEREXTENDS BACKWARD AND IS WEAK-HAS A POWER CHAIR  . Diabetes mellitus     ORAL MED FOR DIABETES  . Blood transfusion 1992    AFTER  SURGERY TO REMOVE BLADDER  . GERD (gastroesophageal reflux disease)     PAST HISTORY GERD--WATCHES DIET - NOT ON ANY MEDS FOR GERD  . Incisional hernia     NO PAIN-NOT CAUSING ANY DISCOMFORT  . Arthritis     OA  . PONV (postoperative nausea and vomiting)     Past Surgical History  Procedure Date  . Appendectomy   . Cholecystectomy   . Tonsillectomy   . Revision urostomy cutaneous 1992  . Abdominal hysterectomy 1990    partial  . Radical cystectomy 1992    neurogenic bladder sec CP  . Rt ankle fusion 1975    SEVERAL RT ANKLE SURGERIES PRIOR TO THE FUSION  . Left carpal tunnel release x 2   . Right carpal tunnel release   . Lumbar sympathetectomies x 2   . Surgery to correct rt hip contracture--at [facility]    [place] a cath  Family History  Problem Relation Age of Onset  . Heart disease Mother 38    COPD  . Heart disease Father     COPD   . Emphysema Father   . Cancer Father     bladder, colon   Social History:  reports that she has never smoked. She has never used smokeless tobacco. She reports that she does not drink alcohol or use illicit drugs.  Allergies:  Allergies  Allergen Reactions  . Morphine Anaphylaxis  . Baclofen     GI upset  . Lisinopril Cough  . Metoclopramide Hcl     Reglan, irritable  . Penicillins     " MAJOR CASE OF HIVES"    No current facility-administered medications  on file as of .   Medications Prior to Admission  Medication Sig Dispense Refill  . ACCU-CHEK SMARTVIEW test strip USE TO TEST TWICE DAILY AS DIRECTED  60 strip  11  . aspirin 81 MG tablet Take 81 mg by mouth every morning.       Marland Kitchen b complex vitamins capsule Take 1 capsule by mouth daily.       . Calcium Citrate-Vitamin D (CALCIUM CITRATE + PO) Take 1 tablet by mouth 2 (two) times daily.       . cetirizine (ZYRTEC) 10 MG tablet Take 10 mg by mouth daily.       . Cholecalciferol (VITAMIN D) 2000 UNITS CAPS Take 2,000 Units by mouth daily.       . furosemide (LASIX) 80 MG tablet Take 80 mg by mouth See admin instructions. 1 tablet EVERY  EVENING   AND  MAY TAKE 2nd dose THE NEXT AM- IF NEEDED      . guaiFENesin (MUCINEX) 600 MG 12 hr tablet Take 600 mg by mouth 2 (two) times daily as needed. Congestion       . Lancets (ACCU-CHEK SOFT TOUCH) lancets Use as instructed  100 each  12  .  losartan (COZAAR) 100 MG tablet Take 100 mg by mouth daily with breakfast.      . metFORMIN (GLUCOPHAGE) 500 MG tablet Take 500-1,000 mg by mouth 2 (two) times daily with a meal. 2 tabs in AM, 1 tab in PM      . Multiple Vitamin (MULTIVITAMIN) capsule Take 2 capsules by mouth daily.       . Omega-3 Fatty Acids (FISH OIL) 1200 MG CAPS Take 1,200 mg by mouth 2 (two) times daily.       . polyethylene glycol powder (GLYCOLAX/MIRALAX) powder Take 17 g by mouth daily. One capfull daily by mouth with liquid        Results for orders placed during the hospital encounter of 10/01/11 (from the past 48 hour(s))  GLUCOSE, CAPILLARY     Status: Abnormal   Collection Time   10/01/11  7:51 AM      Component Value Range Comment   Glucose-Capillary 145 (*) 70 - 99 (mg/dL)    Results for orders placed during the hospital encounter of 10/01/11  GLUCOSE, CAPILLARY      Component Value Range   Glucose-Capillary 145 (*) 70 - 99 (mg/dL)   1/61/0960    WBC 6.8   HGB 12.4   PLTS  315K     K  3.9    CR  0.7   PT 12.4  INR  0.91  Review of Systems  Constitutional: Negative for fever and chills.  Respiratory: Negative for cough and shortness of breath.   Cardiovascular: Negative for chest pain.  Gastrointestinal: Negative for nausea, vomiting and abdominal pain.  Genitourinary: Positive for flank pain. Negative for dysuria and hematuria.       Right flank pain  Neurological: Negative for headaches.  Endo/Heme/Allergies: Does not bruise/bleed easily.  Vitals:  BP  138/52           HR 81   R 14         TEMP 98.7             O2 SATS  95%RA          Physical Exam  Constitutional: She is oriented to person, place, and time. She appears well-developed and well-nourished.  Cardiovascular: Normal rate and regular rhythm.   Respiratory: Effort normal and breath sounds normal.  GI: Soft. Bowel sounds are normal.       Urostomy bag present draining yellow urine  Neurological: She is alert and oriented to person, place, and time.     Assessment/Plan Patient with right ureteral stone and prior cystectomy with ileal conduit for neurogenic bladder; plan is for right percutaneous nephrostomy prior to nephrolithotomy. Details/risks of above d/w pt/husband with their understanding and consent.  Lashell Moffitt,D KEVIN 10/01/2011, 7:54 AM

## 2011-10-01 NOTE — Preoperative (Signed)
Beta Blockers   Reason not to administer Beta Blockers:Not Applicable 

## 2011-10-01 NOTE — Procedures (Signed)
Successful placement of right sided 5 Fr catheter for PNL access.  Tip of catheter is within the urostomy.  No immediate complications.

## 2011-10-02 ENCOUNTER — Inpatient Hospital Stay (HOSPITAL_COMMUNITY): Payer: Medicare Other

## 2011-10-02 MED ORDER — IBUPROFEN 600 MG PO TABS
600.0000 mg | ORAL_TABLET | Freq: Four times a day (QID) | ORAL | Status: DC | PRN
  Administered 2011-10-02 – 2011-10-04 (×4): 600 mg via ORAL
  Filled 2011-10-02 (×4): qty 1

## 2011-10-02 MED ORDER — METFORMIN HCL 500 MG PO TABS
1000.0000 mg | ORAL_TABLET | Freq: Every day | ORAL | Status: DC
  Filled 2011-10-02 (×2): qty 2

## 2011-10-02 MED ORDER — ACETAMINOPHEN 10 MG/ML IV SOLN
1000.0000 mg | Freq: Four times a day (QID) | INTRAVENOUS | Status: AC
  Administered 2011-10-02 – 2011-10-03 (×4): 1000 mg via INTRAVENOUS
  Filled 2011-10-02 (×4): qty 100

## 2011-10-02 MED ORDER — METFORMIN HCL 500 MG PO TABS
500.0000 mg | ORAL_TABLET | Freq: Every day | ORAL | Status: DC
  Administered 2011-10-03: 500 mg via ORAL
  Filled 2011-10-02 (×2): qty 1

## 2011-10-02 MED ORDER — IOHEXOL 300 MG/ML  SOLN
20.0000 mL | Freq: Once | INTRAMUSCULAR | Status: AC | PRN
  Administered 2011-10-02: 20 mL

## 2011-10-02 NOTE — Op Note (Signed)
Preoperative diagnosis: Obstructing right midureteral calculus  Postoperative diagnosis: Same  Principal procedure: Percutaneously accessed right ureteroscopy with holmium laser of right ureteral stone, attempted right double-J stent placement, antegrade nephrostogram, placement of right percutaneous nephrostomy tube  Surgeon: Kervens Roper  Anesthesia: Gen. with endotracheal apparatus  Complications: None  Specimens: Small stone fragments  Drains: 18 French council-tip catheter in right renal pelvis as nephrostomy tube  Indications: 66 year-old female with lower strongly paralysis/history of neurogenic bladder with ileal conduit placement in the past. She presents at this time for percutaneous drainage/access for treatment of her right midureteral stone.  Procedure: The patient was identified properly in the holding area, received preoperative IV antibiotics in the surgical site was marked. She had had a right nephrostomy tube placed in interventional radiology. She had received preoperative IV antibiotics. She was taken to the operating room where general endotracheal anesthetic was administered. She was then placed in the prone position on the OR table, all extremities and pressure points were padded appropriately. Her urostomy bag was hooked to a bedside bag. Her right flank was then prepped and draped, including the nephrostomy tube. Timeout was then performed.  The procedure commenced. I placed a guidewire through the previously placed nephrostomy tube, and advanced it distally into what I consider was ileal conduit. I then removed the nephrostomy tube, and placed a peel-away access sheath was placed. The core was removed, and a second safety guidewire was placed. I then removed the peel-away sheath, and placed a NephroMax balloon or the skin, the flank and into what I consider the renal pelvis. The balloon was inflated to 18 atmospheres of pressure. The nephrostomy sheath was then placed over  this. The balloon was then removed. The rigid nephroscope was then passed into the renal pelvis, and small clots were removed. Small fragments of stones were then removed with the graspers. Following this, I then advanced the digital ureteroscope, without the use of the guidewire, into the right ureter, down to the stone. The stone had been partially fragmented but passage of the guidewires. I used the holmium laser to fragment the stone into multiple smaller fragments, most of which exited distal to the treatment area. Some were rinsed back to the renal pelvis. I tried grasping the stones with the basket, but they were difficult to engage. I then used the laser to fragment the remaining stones in the multiple tiny fragments, which then rinsed out. I tried advancing the ureteroscope over the guidewire past the area treatment. This was difficult, as there was bending of the ureteroscope proximal to this, and I could not get a good "push" to advance the scope. I do not see any significant fragments remaining in the ureter. I then, with multiple attempts, tried to place a double-J stent (28 cm x 6 [place]) over the guidewires which were advanced into the ileal conduit. I was unable, with multiple attempts, the places. Following this, I performed antegrade nephrostogram which revealed some narrowing of the ureter where the treatment had been performed. I did not see any extravasation, however. At this point, over guidewire, I passed an 25 [place] council-tipped catheter into the right renal pelvis. Fluoroscopy and injection revealed this to be in adequate position, and there was no evidence of extravasation. At this point, I filled the balloon with 3 cc of water, and remove the nephrostomy sheath. The catheter was sutured to the skin, and a mattress suture was placed to reapproximate the skin edges. Dry sterile dressing was placed. The patient was  then awakened and taken to PACU in stable condition.

## 2011-10-02 NOTE — Progress Notes (Signed)
1 Day Post-Op  Subjective: Rt Percutaneous nephrostomy placed 4/17 1 liter out 4/17; 600 cc in bag- blood tinged Pt up in bed and pleasant  Objective: Vital signs in last 24 hours: Temp:  [98 F (36.7 C)-98.3 F (36.8 C)] 98.3 F (36.8 C) (04/18 0532) Pulse Rate:  [65-86] 86  (04/18 0532) Resp:  [15-19] 16  (04/18 0532) BP: (116-138)/(51-93) 120/70 mmHg (04/18 0532) SpO2:  [98 %-100 %] 100 % (04/18 0532) Weight:  [251 lb 5.2 oz (114 kg)] 251 lb 5.2 oz (114 kg) (04/17 1548) Last BM Date: 09/30/11  Intake/Output from previous day: 04/17 0701 - 04/18 0700 In: 2456.3 [I.V.:2456.3] Out: 3165 [Urine:3040; Emesis/NG output:75; Blood:50] Intake/Output this shift:    PE:  Afeb; VSS R PCN placed 4/17 Clean and dry; NT Output 1 liter 4/17; 600 cc in bag now Blood tinged   Lab Results:  No results found for this basename: WBC:2,HGB:2,HCT:2,PLT:2 in the last 72 hours BMET No results found for this basename: NA:2,K:2,CL:2,CO2:2,GLUCOSE:2,BUN:2,CREATININE:2,CALCIUM:2 in the last 72 hours PT/INR No results found for this basename: LABPROT:2,INR:2 in the last 72 hours ABG No results found for this basename: PHART:2,PCO2:2,PO2:2,HCO3:2 in the last 72 hours  Studies/Results: Ir Nephrostogram Right  10/01/2011  *RADIOLOGY REPORT*  Indication: Partially obstructing right-sided nephrolithiasis, with persistent right-sided approximate 7 mm stone within the distal aspect of the right ureter in this patient with urostomy secondary to bladder cancer.  1.  ULTRASOUND GUIDANCE FOR PUNCTURE OF THE RIGHT RENAL COLLECTING SYSTEM. 2.  RIGHT PERCUTANEOUS NEPHROSTOMY TUBE PLACEMENT TO BE USED DURING IMPENDING NEPHROLITHOTOMY  Comparison: CT abdomen pelvis - 09/25/2011; 09/01/2011  Sedation: Fentanyl 400 mcg IV; Versed 4 mg IV; Ciprofloxacin 400 mg IV;  The antibiotic was administered in an appropriate time frame prior to skin puncture.  Contrast: 25 Omnipaque-300 administered into the collecting system   Total Moderate Sedation Time: 25 minutes.  Fluoroscopy Time: 6.5 minutes.  Complications: None immediate  Procedure:  The procedure, risks, benefits, and alternatives were explained to the patient.  Questions regarding the procedure were encouraged and answered.  The patient understands and consents to the procedure. A timeout was performed prior to the initiation of the procedure.  A preprocedural spot radiograph was obtained.  Initial ultrasound scanning of the right foramen demonstrated grossly unchanged moderate right-sided hydronephrosis.  The right flank region was prepped with Betadine in a sterile fashion, and a sterile drape was applied covering the operative field.  A sterile gown and sterile gloves were used for the procedure. Local anesthesia was provided with 1% Lidocaine with epinephrine.  Ultrasound was used to localize the right kidney.  Under direct ultrasound guidance, a 21 gauge needle was advanced into a posterior inferior renal calix.  An ultrasound image was saved for documentation purposes, though due to technical failure, the image was not saved.  Access within the collecting system was confirmed with the efflux of urine followed by limited contrast injection.  Over a Nitrex wire, the tract was dilated with an Accustick set.  A small amount of additional contrast was injected as several spot radiographic images were obtained in various obliquities confirming appropriate access.  Over a guide wire, the Accustick set was exchanged for a Kumpe catheter which with the use of a regular glide wire was manipulated through the right ureter and into the urostomy.  Additional fluoroscopic and radiographic images were obtained.  The catheter was secured at the skin with a Silk retention suture and the Kumpe catheter was capped.  A dressing  was placed.  The patient tolerated procedure well without immediate postprocedural complication.  Findings:  Preprocedural spot radiograph failed to definitively  demonstrate either the non-obstructing stone within the inferior aspect of the right kidney or the known partially obstructing stone within the distal aspect of the right ureter.  Ultrasound scanning demonstrates moderate right-sided hydronephrosis, grossly unchanged compared to recent abdominal CT. Under direct ultrasound guidance, a posterior inferior calix was targeted allowing advancement of a 5-French Kumpe catheter to the level of the urostomy.  Contrast injection confirmed appropriate positioning.  Note that the known distal ureteral stone was not definitely identified (even with contrast injection from the distal ureter), likely secondary to patient body habitus and overlying osseous structures.  IMPRESSION:  Successful ultrasound and fluoroscopic guided placement of a right sided 5 [place] Kumpe catheter to the level of the urostomy to be used for impending nephrolithotomy.  Original Report Authenticated By: Waynard Reeds, M.D.   Ir US Guide Bx Asp/drain  10/01/2011  *RADIOLOGY REPORT*  Indication: Partially obstructing right-sided nephrolithiasis, with persistent right-sided approximate 7 mm stone within the distal aspect of the right ureter in this patient with urostomy secondary to bladder cancer.  1.  ULTRASOUND GUIDANCE FOR PUNCTURE OF THE RIGHT RENAL COLLECTING SYSTEM. 2.  RIGHT PERCUTANEOUS NEPHROSTOMY TUBE PLACEMENT TO BE USED DURING IMPENDING NEPHROLITHOTOMY  Comparison: CT abdomen pelvis - 09/25/2011; 09/01/2011  Sedation: Fentanyl 400 mcg IV; Versed 4 mg IV; Ciprofloxacin 400 mg IV;  The antibiotic was administered in an appropriate time frame prior to skin puncture.  Contrast: 25 Omnipaque-300 administered into the collecting system  Total Moderate Sedation Time: 25 minutes.  Fluoroscopy Time: 6.5 minutes.  Complications: None immediate  Procedure:  The procedure, risks, benefits, and alternatives were explained to the patient.  Questions regarding the procedure were encouraged and answered.   The patient understands and consents to the procedure. A timeout was performed prior to the initiation of the procedure.  A preprocedural spot radiograph was obtained.  Initial ultrasound scanning of the right foramen demonstrated grossly unchanged moderate right-sided hydronephrosis.  The right flank region was prepped with Betadine in a sterile fashion, and a sterile drape was applied covering the operative field.  A sterile gown and sterile gloves were used for the procedure. Local anesthesia was provided with 1% Lidocaine with epinephrine.  Ultrasound was used to localize the right kidney.  Under direct ultrasound guidance, a 21 gauge needle was advanced into a posterior inferior renal calix.  An ultrasound image was saved for documentation purposes, though due to technical failure, the image was not saved.  Access within the collecting system was confirmed with the efflux of urine followed by limited contrast injection.  Over a Nitrex wire, the tract was dilated with an Accustick set.  A small amount of additional contrast was injected as several spot radiographic images were obtained in various obliquities confirming appropriate access.  Over a guide wire, the Accustick set was exchanged for a Kumpe catheter which with the use of a regular glide wire was manipulated through the right ureter and into the urostomy.  Additional fluoroscopic and radiographic images were obtained.  The catheter was secured at the skin with a Silk retention suture and the Kumpe catheter was capped.  A dressing was placed.  The patient tolerated procedure well without immediate postprocedural complication.  Findings:  Preprocedural spot radiograph failed to definitively demonstrate either the non-obstructing stone within the inferior aspect of the right kidney or the known partially obstructing stone  within the distal aspect of the right ureter.  Ultrasound scanning demonstrates moderate right-sided hydronephrosis, grossly unchanged  compared to recent abdominal CT. Under direct ultrasound guidance, a posterior inferior calix was targeted allowing advancement of a 5-French Kumpe catheter to the level of the urostomy.  Contrast injection confirmed appropriate positioning.  Note that the known distal ureteral stone was not definitely identified (even with contrast injection from the distal ureter), likely secondary to patient body habitus and overlying osseous structures.  IMPRESSION:  Successful ultrasound and fluoroscopic guided placement of a right sided 5 [place] Kumpe catheter to the level of the urostomy to be used for impending nephrolithotomy.  Original Report Authenticated By: Waynard Reeds, M.D.   Ir [place] Abts Cath Perc Right  10/01/2011  *RADIOLOGY REPORT*  Indication: Partially obstructing right-sided nephrolithiasis, with persistent right-sided approximate 7 mm stone within the distal aspect of the right ureter in this patient with urostomy secondary to bladder cancer.  1.  ULTRASOUND GUIDANCE FOR PUNCTURE OF THE RIGHT RENAL COLLECTING SYSTEM. 2.  RIGHT PERCUTANEOUS NEPHROSTOMY TUBE PLACEMENT TO BE USED DURING IMPENDING NEPHROLITHOTOMY  Comparison: CT abdomen pelvis - 09/25/2011; 09/01/2011  Sedation: Fentanyl 400 mcg IV; Versed 4 mg IV; Ciprofloxacin 400 mg IV;  The antibiotic was administered in an appropriate time frame prior to skin puncture.  Contrast: 25 Omnipaque-300 administered into the collecting system  Total Moderate Sedation Time: 25 minutes.  Fluoroscopy Time: 6.5 minutes.  Complications: None immediate  Procedure:  The procedure, risks, benefits, and alternatives were explained to the patient.  Questions regarding the procedure were encouraged and answered.  The patient understands and consents to the procedure. A timeout was performed prior to the initiation of the procedure.  A preprocedural spot radiograph was obtained.  Initial ultrasound scanning of the right foramen demonstrated grossly unchanged moderate  right-sided hydronephrosis.  The right flank region was prepped with Betadine in a sterile fashion, and a sterile drape was applied covering the operative field.  A sterile gown and sterile gloves were used for the procedure. Local anesthesia was provided with 1% Lidocaine with epinephrine.  Ultrasound was used to localize the right kidney.  Under direct ultrasound guidance, a 21 gauge needle was advanced into a posterior inferior renal calix.  An ultrasound image was saved for documentation purposes, though due to technical failure, the image was not saved.  Access within the collecting system was confirmed with the efflux of urine followed by limited contrast injection.  Over a Nitrex wire, the tract was dilated with an Accustick set.  A small amount of additional contrast was injected as several spot radiographic images were obtained in various obliquities confirming appropriate access.  Over a guide wire, the Accustick set was exchanged for a Kumpe catheter which with the use of a regular glide wire was manipulated through the right ureter and into the urostomy.  Additional fluoroscopic and radiographic images were obtained.  The catheter was secured at the skin with a Silk retention suture and the Kumpe catheter was capped.  A dressing was placed.  The patient tolerated procedure well without immediate postprocedural complication.  Findings:  Preprocedural spot radiograph failed to definitively demonstrate either the non-obstructing stone within the inferior aspect of the right kidney or the known partially obstructing stone within the distal aspect of the right ureter.  Ultrasound scanning demonstrates moderate right-sided hydronephrosis, grossly unchanged compared to recent abdominal CT. Under direct ultrasound guidance, a posterior inferior calix was targeted allowing advancement of a 5-French Kumpe catheter  to the level of the urostomy.  Contrast injection confirmed appropriate positioning.  Note that the  known distal ureteral stone was not definitely identified (even with contrast injection from the distal ureter), likely secondary to patient body habitus and overlying osseous structures.  IMPRESSION:  Successful ultrasound and fluoroscopic guided placement of a right sided 5 [place] Kumpe catheter to the level of the urostomy to be used for impending nephrolithotomy.  Original Report Authenticated By: Waynard Reeds, M.D.    Anti-infectives: Anti-infectives     Start     Dose/Rate Route Frequency Ordered Stop   10/01/11 1800   levofloxacin (LEVAQUIN) IVPB 500 mg        500 mg 100 mL/hr over 60 Minutes Intravenous Every 24 hours 10/01/11 1604     10/01/11 0818   ciprofloxacin (CIPRO) 400 MG/200ML IVPB     Comments: BRISSON, MARK: cabinet override         10/01/11 0818 10/01/11 2029          Assessment/Plan: s/p Procedure(s) (LRB): NEPHROLITHOTOMY PERCUTANEOUS (Right)  Rt PCN placed 4/17 Output good; blood tinged Scheduled now for nephrostogram and possible JJ if needed If nephrostogram shows good flow; may cap and no need for JJ Pt aware of procedure benefits and risks and agreeable to proceed. Consent signed and in chart   Roya Gieselman A 10/02/2011

## 2011-10-02 NOTE — Progress Notes (Signed)
1 Day Post-Op Subjective: Patient reports some nausea last night, but she has had no abdominal pain.  Objective: Vital signs in last 24 hours: Temp:  [98 F (36.7 C)-98.7 F (37.1 C)] 98.3 F (36.8 C) (04/18 0532) Pulse Rate:  [65-95] 86  (04/18 0532) Resp:  [11-19] 16  (04/18 0532) BP: (116-176)/(51-93) 120/70 mmHg (04/18 0532) SpO2:  [95 %-100 %] 100 % (04/18 0532) Weight:  [87.091 kg (192 lb)-114 kg (251 lb 5.2 oz)] 114 kg (251 lb 5.2 oz) (04/17 1548)  Intake/Output from previous day: 04/17 0701 - 04/18 0700 In: 2456.3 [I.V.:2456.3] Out: 3165 [Urine:3040; Emesis/NG output:75; Blood:50] Intake/Output this shift:    Physical Exam:  Constitutional: Vital signs reviewed. WD WN in NAD   Abdomen: Soft, nondistended, nontender. Urine from the ileal conduit is clear.  Lab Results: No results found for this basename: HGB:3,HCT:3 in the last 72 hours BMET No results found for this basename: NA:2,K:2,CL:2,CO2:2,GLUCOSE:2,BUN:2,CREATININE:2,CALCIUM:2 in the last 72 hours No results found for this basename: LABPT:3,INR:3 in the last 72 hours No results found for this basename: LABURIN:1 in the last 72 hours Results for orders placed during the hospital encounter of 09/25/11  SURGICAL PCR SCREEN     Status: Normal   Collection Time   09/25/11 10:40 AM      Component Value Range Status Comment   MRSA, PCR NEGATIVE  NEGATIVE  Final    Staphylococcus aureus NEGATIVE  NEGATIVE  Final     Studies/Results: Ir Nephrostogram Right  10/01/2011  *RADIOLOGY REPORT*  Indication: Partially obstructing right-sided nephrolithiasis, with persistent right-sided approximate 7 mm stone within the distal aspect of the right ureter in this patient with urostomy secondary to bladder cancer.  1.  ULTRASOUND GUIDANCE FOR PUNCTURE OF THE RIGHT RENAL COLLECTING SYSTEM. 2.  RIGHT PERCUTANEOUS NEPHROSTOMY TUBE PLACEMENT TO BE USED DURING IMPENDING NEPHROLITHOTOMY  Comparison: CT abdomen pelvis - 09/25/2011;  09/01/2011  Sedation: Fentanyl 400 mcg IV; Versed 4 mg IV; Ciprofloxacin 400 mg IV;  The antibiotic was administered in an appropriate time frame prior to skin puncture.  Contrast: 25 Omnipaque-300 administered into the collecting system  Total Moderate Sedation Time: 25 minutes.  Fluoroscopy Time: 6.5 minutes.  Complications: None immediate  Procedure:  The procedure, risks, benefits, and alternatives were explained to the patient.  Questions regarding the procedure were encouraged and answered.  The patient understands and consents to the procedure. A timeout was performed prior to the initiation of the procedure.  A preprocedural spot radiograph was obtained.  Initial ultrasound scanning of the right foramen demonstrated grossly unchanged moderate right-sided hydronephrosis.  The right flank region was prepped with Betadine in a sterile fashion, and a sterile drape was applied covering the operative field.  A sterile gown and sterile gloves were used for the procedure. Local anesthesia was provided with 1% Lidocaine with epinephrine.  Ultrasound was used to localize the right kidney.  Under direct ultrasound guidance, a 21 gauge needle was advanced into a posterior inferior renal calix.  An ultrasound image was saved for documentation purposes, though due to technical failure, the image was not saved.  Access within the collecting system was confirmed with the efflux of urine followed by limited contrast injection.  Over a Nitrex wire, the tract was dilated with an Accustick set.  A small amount of additional contrast was injected as several spot radiographic images were obtained in various obliquities confirming appropriate access.  Over a guide wire, the Accustick set was exchanged for a Kumpe catheter which  with the use of a regular glide wire was manipulated through the right ureter and into the urostomy.  Additional fluoroscopic and radiographic images were obtained.  The catheter was secured at the skin  with a Silk retention suture and the Kumpe catheter was capped.  A dressing was placed.  The patient tolerated procedure well without immediate postprocedural complication.  Findings:  Preprocedural spot radiograph failed to definitively demonstrate either the non-obstructing stone within the inferior aspect of the right kidney or the known partially obstructing stone within the distal aspect of the right ureter.  Ultrasound scanning demonstrates moderate right-sided hydronephrosis, grossly unchanged compared to recent abdominal CT. Under direct ultrasound guidance, a posterior inferior calix was targeted allowing advancement of a 5-French Kumpe catheter to the level of the urostomy.  Contrast injection confirmed appropriate positioning.  Note that the known distal ureteral stone was not definitely identified (even with contrast injection from the distal ureter), likely secondary to patient body habitus and overlying osseous structures.  IMPRESSION:  Successful ultrasound and fluoroscopic guided placement of a right sided 5 [place] Kumpe catheter to the level of the urostomy to be used for impending nephrolithotomy.  Original Report Authenticated By: Waynard Reeds, M.D.   Ir US Guide Bx Asp/drain  10/01/2011  *RADIOLOGY REPORT*  Indication: Partially obstructing right-sided nephrolithiasis, with persistent right-sided approximate 7 mm stone within the distal aspect of the right ureter in this patient with urostomy secondary to bladder cancer.  1.  ULTRASOUND GUIDANCE FOR PUNCTURE OF THE RIGHT RENAL COLLECTING SYSTEM. 2.  RIGHT PERCUTANEOUS NEPHROSTOMY TUBE PLACEMENT TO BE USED DURING IMPENDING NEPHROLITHOTOMY  Comparison: CT abdomen pelvis - 09/25/2011; 09/01/2011  Sedation: Fentanyl 400 mcg IV; Versed 4 mg IV; Ciprofloxacin 400 mg IV;  The antibiotic was administered in an appropriate time frame prior to skin puncture.  Contrast: 25 Omnipaque-300 administered into the collecting system  Total Moderate Sedation  Time: 25 minutes.  Fluoroscopy Time: 6.5 minutes.  Complications: None immediate  Procedure:  The procedure, risks, benefits, and alternatives were explained to the patient.  Questions regarding the procedure were encouraged and answered.  The patient understands and consents to the procedure. A timeout was performed prior to the initiation of the procedure.  A preprocedural spot radiograph was obtained.  Initial ultrasound scanning of the right foramen demonstrated grossly unchanged moderate right-sided hydronephrosis.  The right flank region was prepped with Betadine in a sterile fashion, and a sterile drape was applied covering the operative field.  A sterile gown and sterile gloves were used for the procedure. Local anesthesia was provided with 1% Lidocaine with epinephrine.  Ultrasound was used to localize the right kidney.  Under direct ultrasound guidance, a 21 gauge needle was advanced into a posterior inferior renal calix.  An ultrasound image was saved for documentation purposes, though due to technical failure, the image was not saved.  Access within the collecting system was confirmed with the efflux of urine followed by limited contrast injection.  Over a Nitrex wire, the tract was dilated with an Accustick set.  A small amount of additional contrast was injected as several spot radiographic images were obtained in various obliquities confirming appropriate access.  Over a guide wire, the Accustick set was exchanged for a Kumpe catheter which with the use of a regular glide wire was manipulated through the right ureter and into the urostomy.  Additional fluoroscopic and radiographic images were obtained.  The catheter was secured at the skin with a Silk retention suture and  the Kumpe catheter was capped.  A dressing was placed.  The patient tolerated procedure well without immediate postprocedural complication.  Findings:  Preprocedural spot radiograph failed to definitively demonstrate either the  non-obstructing stone within the inferior aspect of the right kidney or the known partially obstructing stone within the distal aspect of the right ureter.  Ultrasound scanning demonstrates moderate right-sided hydronephrosis, grossly unchanged compared to recent abdominal CT. Under direct ultrasound guidance, a posterior inferior calix was targeted allowing advancement of a 5-French Kumpe catheter to the level of the urostomy.  Contrast injection confirmed appropriate positioning.  Note that the known distal ureteral stone was not definitely identified (even with contrast injection from the distal ureter), likely secondary to patient body habitus and overlying osseous structures.  IMPRESSION:  Successful ultrasound and fluoroscopic guided placement of a right sided 5 [place] Kumpe catheter to the level of the urostomy to be used for impending nephrolithotomy.  Original Report Authenticated By: Waynard Reeds, M.D.   Ir [place] Abts Cath Perc Right  10/01/2011  *RADIOLOGY REPORT*  Indication: Partially obstructing right-sided nephrolithiasis, with persistent right-sided approximate 7 mm stone within the distal aspect of the right ureter in this patient with urostomy secondary to bladder cancer.  1.  ULTRASOUND GUIDANCE FOR PUNCTURE OF THE RIGHT RENAL COLLECTING SYSTEM. 2.  RIGHT PERCUTANEOUS NEPHROSTOMY TUBE PLACEMENT TO BE USED DURING IMPENDING NEPHROLITHOTOMY  Comparison: CT abdomen pelvis - 09/25/2011; 09/01/2011  Sedation: Fentanyl 400 mcg IV; Versed 4 mg IV; Ciprofloxacin 400 mg IV;  The antibiotic was administered in an appropriate time frame prior to skin puncture.  Contrast: 25 Omnipaque-300 administered into the collecting system  Total Moderate Sedation Time: 25 minutes.  Fluoroscopy Time: 6.5 minutes.  Complications: None immediate  Procedure:  The procedure, risks, benefits, and alternatives were explained to the patient.  Questions regarding the procedure were encouraged and answered.  The patient  understands and consents to the procedure. A timeout was performed prior to the initiation of the procedure.  A preprocedural spot radiograph was obtained.  Initial ultrasound scanning of the right foramen demonstrated grossly unchanged moderate right-sided hydronephrosis.  The right flank region was prepped with Betadine in a sterile fashion, and a sterile drape was applied covering the operative field.  A sterile gown and sterile gloves were used for the procedure. Local anesthesia was provided with 1% Lidocaine with epinephrine.  Ultrasound was used to localize the right kidney.  Under direct ultrasound guidance, a 21 gauge needle was advanced into a posterior inferior renal calix.  An ultrasound image was saved for documentation purposes, though due to technical failure, the image was not saved.  Access within the collecting system was confirmed with the efflux of urine followed by limited contrast injection.  Over a Nitrex wire, the tract was dilated with an Accustick set.  A small amount of additional contrast was injected as several spot radiographic images were obtained in various obliquities confirming appropriate access.  Over a guide wire, the Accustick set was exchanged for a Kumpe catheter which with the use of a regular glide wire was manipulated through the right ureter and into the urostomy.  Additional fluoroscopic and radiographic images were obtained.  The catheter was secured at the skin with a Silk retention suture and the Kumpe catheter was capped.  A dressing was placed.  The patient tolerated procedure well without immediate postprocedural complication.  Findings:  Preprocedural spot radiograph failed to definitively demonstrate either the non-obstructing stone within the inferior aspect of  the right kidney or the known partially obstructing stone within the distal aspect of the right ureter.  Ultrasound scanning demonstrates moderate right-sided hydronephrosis, grossly unchanged compared to  recent abdominal CT. Under direct ultrasound guidance, a posterior inferior calix was targeted allowing advancement of a 5-French Kumpe catheter to the level of the urostomy.  Contrast injection confirmed appropriate positioning.  Note that the known distal ureteral stone was not definitely identified (even with contrast injection from the distal ureter), likely secondary to patient body habitus and overlying osseous structures.  IMPRESSION:  Successful ultrasound and fluoroscopic guided placement of a right sided 5 [place] Kumpe catheter to the level of the urostomy to be used for impending nephrolithotomy.  Original Report Authenticated By: Waynard Reeds, M.D.    Assessment/Plan:   Postoperative day 1 percutaneous access/ureteroscopy with holmium laser of right mid ureteral stone. She is doing well.    I will have her sent to radiology when they can accommodate her for antegrade nephrostogram on the right. If her right ureter is draining appropriately/well, we will eventually plugged her nephrostomy tube. If it is not draining appropriately/there is narrowing or obstruction, I would opt to have a double-J stent placed from her kidney into her ileal conduit to allow her kidney to drain better.    I will discontinue her hydromorphone, and switch her to IV Tylenol and oral oxycodone.   LOS: 1 day   [name] 10/02/2011, 7:48 AM

## 2011-10-03 ENCOUNTER — Inpatient Hospital Stay (HOSPITAL_COMMUNITY): Payer: Medicare Other

## 2011-10-03 LAB — GLUCOSE, CAPILLARY: Glucose-Capillary: 135 mg/dL — ABNORMAL HIGH (ref 70–99)

## 2011-10-03 MED ORDER — LIDOCAINE HCL 1 % IJ SOLN
INTRAMUSCULAR | Status: AC
  Filled 2011-10-03: qty 20

## 2011-10-03 MED ORDER — MIDAZOLAM HCL 5 MG/5ML IJ SOLN
INTRAMUSCULAR | Status: AC | PRN
  Administered 2011-10-03 (×2): 2 mg via INTRAVENOUS

## 2011-10-03 MED ORDER — FENTANYL CITRATE 0.05 MG/ML IJ SOLN
INTRAMUSCULAR | Status: AC | PRN
  Administered 2011-10-03 (×3): 100 ug via INTRAVENOUS

## 2011-10-03 MED ORDER — IOHEXOL 300 MG/ML  SOLN
20.0000 mL | Freq: Once | INTRAMUSCULAR | Status: AC | PRN
  Administered 2011-10-03: 20 mL

## 2011-10-03 MED ORDER — SULFAMETHOXAZOLE-TRIMETHOPRIM 800-160 MG PO TABS: 1.0000 | ORAL_TABLET | Freq: Two times a day (BID) | ORAL | Status: AC

## 2011-10-03 NOTE — Procedures (Signed)
Right nephrostogram demonstrates no flow into the ileal conduit.  A catheter and wire were advanced beyond the obstruction and a 26 cm ureteral stent was placed.  Nephrostomy tube removed.  No immediate complication.

## 2011-10-03 NOTE — Progress Notes (Signed)
2 Days Post-Op Subjective: Patient reports no pain at the present time. She had a long day yesterday. Nephrostogram revealed obstruction of the right distal ureter, approximately where the stone was. Stent was not placed.  Objective: Vital signs in last 24 hours: Temp:  [98.2 F (36.8 C)-98.7 F (37.1 C)] 98.7 F (37.1 C) (04/19 0534) Pulse Rate:  [88-91] 88  (04/19 0534) Resp:  [16-20] 20  (04/19 0534) BP: (112-131)/(66-74) 123/72 mmHg (04/19 0534) SpO2:  [93 %-95 %] 95 % (04/19 0534)  Intake/Output from previous day: 04/18 0701 - 04/19 0700 In: 2560 [P.O.:360; I.V.:1700; IV Piggyback:500] Out: 4300 [Urine:4300] Intake/Output this shift:    Physical Exam:  Constitutional: Vital signs reviewed. WD WN in NAD     Lab Results: No results found for this basename: HGB:3,HCT:3 in the last 72 hours BMET No results found for this basename: NA:2,K:2,CL:2,CO2:2,GLUCOSE:2,BUN:2,CREATININE:2,CALCIUM:2 in the last 72 hours No results found for this basename: LABPT:3,INR:3 in the last 72 hours No results found for this basename: LABURIN:1 in the last 72 hours Results for orders placed during the hospital encounter of 09/25/11  SURGICAL PCR SCREEN     Status: Normal   Collection Time   09/25/11 10:40 AM      Component Value Range Status Comment   MRSA, PCR NEGATIVE  NEGATIVE  Final    Staphylococcus aureus NEGATIVE  NEGATIVE  Final     Studies/Results: Ir Nephrostogram Right  10/01/2011  *RADIOLOGY REPORT*  Indication: Partially obstructing right-sided nephrolithiasis, with persistent right-sided approximate 7 mm stone within the distal aspect of the right ureter in this patient with urostomy secondary to bladder cancer.  1.  ULTRASOUND GUIDANCE FOR PUNCTURE OF THE RIGHT RENAL COLLECTING SYSTEM. 2.  RIGHT PERCUTANEOUS NEPHROSTOMY TUBE PLACEMENT TO BE USED DURING IMPENDING NEPHROLITHOTOMY  Comparison: CT abdomen pelvis - 09/25/2011; 09/01/2011  Sedation: Fentanyl 400 mcg IV; Versed 4 mg IV;  Ciprofloxacin 400 mg IV;  The antibiotic was administered in an appropriate time frame prior to skin puncture.  Contrast: 25 Omnipaque-300 administered into the collecting system  Total Moderate Sedation Time: 25 minutes.  Fluoroscopy Time: 6.5 minutes.  Complications: None immediate  Procedure:  The procedure, risks, benefits, and alternatives were explained to the patient.  Questions regarding the procedure were encouraged and answered.  The patient understands and consents to the procedure. A timeout was performed prior to the initiation of the procedure.  A preprocedural spot radiograph was obtained.  Initial ultrasound scanning of the right foramen demonstrated grossly unchanged moderate right-sided hydronephrosis.  The right flank region was prepped with Betadine in a sterile fashion, and a sterile drape was applied covering the operative field.  A sterile gown and sterile gloves were used for the procedure. Local anesthesia was provided with 1% Lidocaine with epinephrine.  Ultrasound was used to localize the right kidney.  Under direct ultrasound guidance, a 21 gauge needle was advanced into a posterior inferior renal calix.  An ultrasound image was saved for documentation purposes, though due to technical failure, the image was not saved.  Access within the collecting system was confirmed with the efflux of urine followed by limited contrast injection.  Over a Nitrex wire, the tract was dilated with an Accustick set.  A small amount of additional contrast was injected as several spot radiographic images were obtained in various obliquities confirming appropriate access.  Over a guide wire, the Accustick set was exchanged for a Kumpe catheter which with the use of a regular glide wire was manipulated through  the right ureter and into the urostomy.  Additional fluoroscopic and radiographic images were obtained.  The catheter was secured at the skin with a Silk retention suture and the Kumpe catheter was  capped.  A dressing was placed.  The patient tolerated procedure well without immediate postprocedural complication.  Findings:  Preprocedural spot radiograph failed to definitively demonstrate either the non-obstructing stone within the inferior aspect of the right kidney or the known partially obstructing stone within the distal aspect of the right ureter.  Ultrasound scanning demonstrates moderate right-sided hydronephrosis, grossly unchanged compared to recent abdominal CT. Under direct ultrasound guidance, a posterior inferior calix was targeted allowing advancement of a 5-French Kumpe catheter to the level of the urostomy.  Contrast injection confirmed appropriate positioning.  Note that the known distal ureteral stone was not definitely identified (even with contrast injection from the distal ureter), likely secondary to patient body habitus and overlying osseous structures.  IMPRESSION:  Successful ultrasound and fluoroscopic guided placement of a right sided 5 [place] Kumpe catheter to the level of the urostomy to be used for impending nephrolithotomy.  Original Report Authenticated By: Waynard Reeds, M.D.   Ir US Guide Bx Asp/drain  10/01/2011  *RADIOLOGY REPORT*  Indication: Partially obstructing right-sided nephrolithiasis, with persistent right-sided approximate 7 mm stone within the distal aspect of the right ureter in this patient with urostomy secondary to bladder cancer.  1.  ULTRASOUND GUIDANCE FOR PUNCTURE OF THE RIGHT RENAL COLLECTING SYSTEM. 2.  RIGHT PERCUTANEOUS NEPHROSTOMY TUBE PLACEMENT TO BE USED DURING IMPENDING NEPHROLITHOTOMY  Comparison: CT abdomen pelvis - 09/25/2011; 09/01/2011  Sedation: Fentanyl 400 mcg IV; Versed 4 mg IV; Ciprofloxacin 400 mg IV;  The antibiotic was administered in an appropriate time frame prior to skin puncture.  Contrast: 25 Omnipaque-300 administered into the collecting system  Total Moderate Sedation Time: 25 minutes.  Fluoroscopy Time: 6.5 minutes.   Complications: None immediate  Procedure:  The procedure, risks, benefits, and alternatives were explained to the patient.  Questions regarding the procedure were encouraged and answered.  The patient understands and consents to the procedure. A timeout was performed prior to the initiation of the procedure.  A preprocedural spot radiograph was obtained.  Initial ultrasound scanning of the right foramen demonstrated grossly unchanged moderate right-sided hydronephrosis.  The right flank region was prepped with Betadine in a sterile fashion, and a sterile drape was applied covering the operative field.  A sterile gown and sterile gloves were used for the procedure. Local anesthesia was provided with 1% Lidocaine with epinephrine.  Ultrasound was used to localize the right kidney.  Under direct ultrasound guidance, a 21 gauge needle was advanced into a posterior inferior renal calix.  An ultrasound image was saved for documentation purposes, though due to technical failure, the image was not saved.  Access within the collecting system was confirmed with the efflux of urine followed by limited contrast injection.  Over a Nitrex wire, the tract was dilated with an Accustick set.  A small amount of additional contrast was injected as several spot radiographic images were obtained in various obliquities confirming appropriate access.  Over a guide wire, the Accustick set was exchanged for a Kumpe catheter which with the use of a regular glide wire was manipulated through the right ureter and into the urostomy.  Additional fluoroscopic and radiographic images were obtained.  The catheter was secured at the skin with a Silk retention suture and the Kumpe catheter was capped.  A dressing was placed.  The patient tolerated procedure well without immediate postprocedural complication.  Findings:  Preprocedural spot radiograph failed to definitively demonstrate either the non-obstructing stone within the inferior aspect of the  right kidney or the known partially obstructing stone within the distal aspect of the right ureter.  Ultrasound scanning demonstrates moderate right-sided hydronephrosis, grossly unchanged compared to recent abdominal CT. Under direct ultrasound guidance, a posterior inferior calix was targeted allowing advancement of a 5-French Kumpe catheter to the level of the urostomy.  Contrast injection confirmed appropriate positioning.  Note that the known distal ureteral stone was not definitely identified (even with contrast injection from the distal ureter), likely secondary to patient body habitus and overlying osseous structures.  IMPRESSION:  Successful ultrasound and fluoroscopic guided placement of a right sided 5 [place] Kumpe catheter to the level of the urostomy to be used for impending nephrolithotomy.  Original Report Authenticated By: Waynard Reeds, M.D.   Dg Nephrostogram Right  10/02/2011  *RADIOLOGY REPORT*  Clinical Data: ASSESS PATENCY OF RIGHT URETER.  RIGHT NEPHROSTOGRAM  Comparison: CT 09/25/2011  Findings: Contrast was injected through the indwelling right red rubber catheter.  This opacifies a mildly prominent right renal collecting system.  Right ureter is tortuous and fills to near the pelvic brim.  No contrast passed beyond the mid-right ureter.  IMPRESSION: Mildly prominent collecting system and tortuous proximal right ureter.  No contrast passes beyond the mid-right ureter.  Original Report Authenticated By: Cyndie Chime, M.D.   Ir [place] Abts Cath Perc Right  10/01/2011  *RADIOLOGY REPORT*  Indication: Partially obstructing right-sided nephrolithiasis, with persistent right-sided approximate 7 mm stone within the distal aspect of the right ureter in this patient with urostomy secondary to bladder cancer.  1.  ULTRASOUND GUIDANCE FOR PUNCTURE OF THE RIGHT RENAL COLLECTING SYSTEM. 2.  RIGHT PERCUTANEOUS NEPHROSTOMY TUBE PLACEMENT TO BE USED DURING IMPENDING NEPHROLITHOTOMY  Comparison: CT  abdomen pelvis - 09/25/2011; 09/01/2011  Sedation: Fentanyl 400 mcg IV; Versed 4 mg IV; Ciprofloxacin 400 mg IV;  The antibiotic was administered in an appropriate time frame prior to skin puncture.  Contrast: 25 Omnipaque-300 administered into the collecting system  Total Moderate Sedation Time: 25 minutes.  Fluoroscopy Time: 6.5 minutes.  Complications: None immediate  Procedure:  The procedure, risks, benefits, and alternatives were explained to the patient.  Questions regarding the procedure were encouraged and answered.  The patient understands and consents to the procedure. A timeout was performed prior to the initiation of the procedure.  A preprocedural spot radiograph was obtained.  Initial ultrasound scanning of the right foramen demonstrated grossly unchanged moderate right-sided hydronephrosis.  The right flank region was prepped with Betadine in a sterile fashion, and a sterile drape was applied covering the operative field.  A sterile gown and sterile gloves were used for the procedure. Local anesthesia was provided with 1% Lidocaine with epinephrine.  Ultrasound was used to localize the right kidney.  Under direct ultrasound guidance, a 21 gauge needle was advanced into a posterior inferior renal calix.  An ultrasound image was saved for documentation purposes, though due to technical failure, the image was not saved.  Access within the collecting system was confirmed with the efflux of urine followed by limited contrast injection.  Over a Nitrex wire, the tract was dilated with an Accustick set.  A small amount of additional contrast was injected as several spot radiographic images were obtained in various obliquities confirming appropriate access.  Over a guide wire, the Accustick set was exchanged  for a Kumpe catheter which with the use of a regular glide wire was manipulated through the right ureter and into the urostomy.  Additional fluoroscopic and radiographic images were obtained.  The  catheter was secured at the skin with a Silk retention suture and the Kumpe catheter was capped.  A dressing was placed.  The patient tolerated procedure well without immediate postprocedural complication.  Findings:  Preprocedural spot radiograph failed to definitively demonstrate either the non-obstructing stone within the inferior aspect of the right kidney or the known partially obstructing stone within the distal aspect of the right ureter.  Ultrasound scanning demonstrates moderate right-sided hydronephrosis, grossly unchanged compared to recent abdominal CT. Under direct ultrasound guidance, a posterior inferior calix was targeted allowing advancement of a 5-French Kumpe catheter to the level of the urostomy.  Contrast injection confirmed appropriate positioning.  Note that the known distal ureteral stone was not definitely identified (even with contrast injection from the distal ureter), likely secondary to patient body habitus and overlying osseous structures.  IMPRESSION:  Successful ultrasound and fluoroscopic guided placement of a right sided 5 [place] Kumpe catheter to the level of the urostomy to be used for impending nephrolithotomy.  Original Report Authenticated By: Waynard Reeds, M.D.    Assessment/Plan:   Postoperative day #2 from ureteroscopy and percutaneous access and holmium laser of right distal ureteral stone.    Hopefully, she can have her stent placed today.   LOS: 2 days   Marcine Matar M 10/03/2011, 7:47 AM

## 2011-10-04 NOTE — Discharge Summary (Signed)
Physician Discharge Summary  Patient ID: Jordan [name] MRN: [id number] DOB/AGE: Jul 29, 1945 66 y.o.  Admit date: 10/01/2011 Discharge date: 10/04/2011  Admission Diagnoses: Rt Ureteral Stone with h/o ileal conduit urinary diversion  Discharge Diagnoses: Rt Ureteral Stone with h/o ileal conduit urinary diversion and partial rt ureteral stricture  Active Problems:  * No active hospital problems. *    Discharged Condition: good  Hospital Course: Pt admitted 4/17 and underwent percutaneous rt ureteroscopy for symptomatic 1cm stone. Rt distal partial stricture noted intraoperatively necessitating placement of anterograde JJ stent on 4/19. By 4/20, the day of discharge, the patient was tolerating a diet, pain was controlled, all tubes were out, and she was suitable for discharge.   Consults: Interventional Radiology  Significant Diagnostic Studies: radiology: nephrostogram with partial right ureteral stricture 4/17  Treatments: surgery: Right percutaneous ureteroscopy on 4/17.  Discharge Exam: Blood pressure 112/68, pulse 76, temperature 98.3 F (36.8 C), temperature source Oral, resp. rate 16, height 5\' 9"  (1.753 m), weight 114 kg (251 lb 5.2 oz), SpO2 98.00%. General appearance: alert and cooperative Head: Normocephalic, without obvious abnormality, atraumatic Eyes: conjunctivae/corneas clear. PERRL, EOM's intact. Fundi benign. Neck: no adenopathy, no carotid bruit, no JVD, supple, symmetrical, trachea midline and thyroid not enlarged, symmetric, no tenderness/mass/nodules Back: symmetric, no curvature. ROM normal. No CVA tenderness. Resp: clear to auscultation bilaterally Chest wall: no tenderness Cardio: regular rate and rhythm, S1, S2 normal, no murmur, click, rub or gallop Female genitalia: normal Extremities: extremities normal, atraumatic, no cyanosis or edema Pulses: 2+ and symmetric Skin: Skin color, texture, turgor normal. No rashes or lesions Neurologic: Alert and oriented X  3, normal strength and tone. Normal symmetric reflexes. Normal coordination and gait Incision/Wound: Rt percutaneous site c/d/i with dry dressing. Abd: obese. Urostomy stoma pink and patent of clear urine  Disposition: Final discharge disposition not confirmed  Discharge Orders    Future Appointments: Provider: Department: Dept Phone: Center:   12/02/2011 8:45 AM Kristian Covey, MD [facility] [phone] [facility]     Medication List  As of 10/04/2011  6:57 AM   TAKE these medications         ACCU-CHEK SMARTVIEW test strip   Generic drug: glucose blood   USE TO TEST TWICE DAILY AS DIRECTED      accu-chek soft touch lancets   Use as instructed      acetaminophen 500 MG tablet   Commonly known as: TYLENOL   Take 1,000 mg by mouth every 6 (six) hours as needed. Pain        aspirin 81 MG tablet   Take 81 mg by mouth every morning.      b complex vitamins capsule   Take 1 capsule by mouth daily.      CALCIUM CITRATE + PO   Take 1 tablet by mouth 2 (two) times daily.      cetirizine 10 MG tablet   Commonly known as: ZYRTEC   Take 10 mg by mouth daily.      Fish Oil 1200 MG Caps   Take 1,200 mg by mouth 2 (two) times daily.      furosemide 80 MG tablet   Commonly known as: LASIX   Take 80 mg by mouth See admin instructions. 1 tablet EVERY  EVENING   AND  MAY TAKE 2nd dose THE NEXT AM- IF NEEDED      guaiFENesin 600 MG 12 hr tablet   Commonly known as: MUCINEX   Take 600 mg by mouth 2 (two) times  daily as needed. Congestion        HYDROcodone-acetaminophen 5-500 MG per tablet   Commonly known as: VICODIN   Take 1-2 tablets by mouth every 6 (six) hours as needed. KIDNEY STONE Pain        losartan 100 MG tablet   Commonly known as: COZAAR   Take 100 mg by mouth daily with breakfast.      metFORMIN 500 MG tablet   Commonly known as: GLUCOPHAGE   Take 500-1,000 mg by mouth 2 (two) times daily with a meal. 2 tabs in AM, 1 tab in PM      multivitamin capsule    Take 2 capsules by mouth daily.      polyethylene glycol powder powder   Commonly known as: GLYCOLAX/MIRALAX   Take 17 g by mouth daily. One capfull daily by mouth with liquid      simvastatin 40 MG tablet   Commonly known as: ZOCOR   Take 40 mg by mouth every evening.      sodium chloride 0.65 % nasal spray   Commonly known as: OCEAN   Place 1-2 sprays into the nose as needed. Allergies        sulfamethoxazole-trimethoprim 800-160 MG per tablet   Commonly known as: BACTRIM DS,SEPTRA DS   Take 1 tablet by mouth 2 (two) times daily.      Vitamin D 2000 UNITS Caps   Take 2,000 Units by mouth daily.             SignedSebastian Ache 10/04/2011, 6:57 AM

## 2011-10-07 ENCOUNTER — Other Ambulatory Visit: Payer: Self-pay | Admitting: Family Medicine

## 2011-10-13 ENCOUNTER — Encounter (HOSPITAL_COMMUNITY): Payer: Self-pay | Admitting: Urology

## 2011-10-15 ENCOUNTER — Other Ambulatory Visit: Payer: Self-pay | Admitting: Otolaryngology

## 2011-10-24 ENCOUNTER — Ambulatory Visit
Admission: RE | Admit: 2011-10-24 | Discharge: 2011-10-24 | Disposition: A | Discharge status: Home / Self Care | Payer: Medicare Other | Source: Ambulatory Visit | Attending: Otolaryngology | Admitting: Otolaryngology

## 2011-10-24 MED ORDER — GADOBENATE DIMEGLUMINE 529 MG/ML IV SOLN
17.0000 mL | Freq: Once | INTRAVENOUS | Status: AC | PRN
  Administered 2011-10-24: 17 mL via INTRAVENOUS

## 2011-11-04 ENCOUNTER — Other Ambulatory Visit: Payer: Self-pay | Admitting: Family Medicine

## 2011-11-29 ENCOUNTER — Inpatient Hospital Stay (HOSPITAL_COMMUNITY)
Admission: EM | Admit: 2011-11-29 | Discharge: 2011-12-03 | DRG: 872 | Disposition: A | Discharge status: Home / Self Care | Payer: Medicare Other | Attending: Internal Medicine | Admitting: Internal Medicine

## 2011-11-29 ENCOUNTER — Emergency Department (HOSPITAL_COMMUNITY): Payer: Medicare Other

## 2011-11-29 DIAGNOSIS — M47817 Spondylosis without myelopathy or radiculopathy, lumbosacral region: Secondary | ICD-10-CM | POA: Diagnosis present

## 2011-11-29 DIAGNOSIS — Z79899 Other long term (current) drug therapy: Secondary | ICD-10-CM

## 2011-11-29 DIAGNOSIS — R509 Fever, unspecified: Secondary | ICD-10-CM

## 2011-11-29 DIAGNOSIS — N12 Tubulo-interstitial nephritis, not specified as acute or chronic: Secondary | ICD-10-CM | POA: Diagnosis present

## 2011-11-29 DIAGNOSIS — E876 Hypokalemia: Secondary | ICD-10-CM | POA: Diagnosis present

## 2011-11-29 DIAGNOSIS — I10 Essential (primary) hypertension: Secondary | ICD-10-CM | POA: Diagnosis present

## 2011-11-29 DIAGNOSIS — N390 Urinary tract infection, site not specified: Secondary | ICD-10-CM

## 2011-11-29 DIAGNOSIS — J189 Pneumonia, unspecified organism: Secondary | ICD-10-CM

## 2011-11-29 DIAGNOSIS — N181 Chronic kidney disease, stage 1: Secondary | ICD-10-CM | POA: Diagnosis present

## 2011-11-29 DIAGNOSIS — A419 Sepsis, unspecified organism: Secondary | ICD-10-CM | POA: Diagnosis present

## 2011-11-29 DIAGNOSIS — Z906 Acquired absence of other parts of urinary tract: Secondary | ICD-10-CM

## 2011-11-29 DIAGNOSIS — E785 Hyperlipidemia, unspecified: Secondary | ICD-10-CM | POA: Diagnosis present

## 2011-11-29 DIAGNOSIS — E119 Type 2 diabetes mellitus without complications: Secondary | ICD-10-CM | POA: Diagnosis present

## 2011-11-29 DIAGNOSIS — I129 Hypertensive chronic kidney disease with stage 1 through stage 4 chronic kidney disease, or unspecified chronic kidney disease: Secondary | ICD-10-CM | POA: Diagnosis present

## 2011-11-29 DIAGNOSIS — Z932 Ileostomy status: Secondary | ICD-10-CM

## 2011-11-29 DIAGNOSIS — A4159 Other Gram-negative sepsis: Principal | ICD-10-CM | POA: Diagnosis present

## 2011-11-29 DIAGNOSIS — Z7982 Long term (current) use of aspirin: Secondary | ICD-10-CM

## 2011-11-29 DIAGNOSIS — D6489 Other specified anemias: Secondary | ICD-10-CM | POA: Diagnosis present

## 2011-11-29 DIAGNOSIS — G809 Cerebral palsy, unspecified: Secondary | ICD-10-CM | POA: Diagnosis present

## 2011-11-29 LAB — URINALYSIS, ROUTINE W REFLEX MICROSCOPIC
Glucose, UA: NEGATIVE mg/dL
Protein, ur: 100 mg/dL — AB
Urobilinogen, UA: 0.2 mg/dL (ref 0.0–1.0)

## 2011-11-29 LAB — COMPREHENSIVE METABOLIC PANEL
ALT: 44 U/L — ABNORMAL HIGH (ref 0–35)
CO2: 22 mEq/L (ref 19–32)
Calcium: 9.4 mg/dL (ref 8.4–10.5)
Creatinine, Ser: 1.09 mg/dL (ref 0.50–1.10)
GFR calc Af Amer: 59 mL/min — ABNORMAL LOW (ref >90)
GFR calc non Af Amer: 51 mL/min — ABNORMAL LOW (ref >90)
Glucose, Bld: 170 mg/dL — ABNORMAL HIGH (ref 70–99)
Sodium: 136 mEq/L (ref 135–145)
Total Bilirubin: 0.6 mg/dL (ref 0.3–1.2)

## 2011-11-29 LAB — CBC
HCT: 41.1 % (ref 36.0–46.0)
Hemoglobin: 13.2 g/dL (ref 12.0–15.0)
MCV: 87.3 fL (ref 78.0–100.0)
RBC: 4.71 MIL/uL (ref 3.87–5.11)
WBC: 15.1 10*3/uL — ABNORMAL HIGH (ref 4.0–10.5)

## 2011-11-29 LAB — DIFFERENTIAL
Eosinophils Relative: 0 % (ref 0–5)
Lymphocytes Relative: 7 % — ABNORMAL LOW (ref 12–46)
Lymphs Abs: 1.1 10*3/uL (ref 0.7–4.0)
Monocytes Absolute: 0.8 10*3/uL (ref 0.1–1.0)

## 2011-11-29 MED ORDER — LEVOFLOXACIN IN D5W 750 MG/150ML IV SOLN
750.0000 mg | INTRAVENOUS | Status: DC
  Administered 2011-11-29 – 2011-12-01 (×3): 750 mg via INTRAVENOUS
  Filled 2011-11-29 (×4): qty 150

## 2011-11-29 MED ORDER — SODIUM CHLORIDE 0.9 % IV BOLUS (SEPSIS)
1000.0000 mL | Freq: Once | INTRAVENOUS | Status: AC
  Administered 2011-11-29: 1000 mL via INTRAVENOUS

## 2011-11-29 MED ORDER — ACETAMINOPHEN 325 MG PO TABS
650.0000 mg | ORAL_TABLET | Freq: Once | ORAL | Status: AC
  Administered 2011-11-29: 650 mg via ORAL
  Filled 2011-11-29: qty 2
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
## 2011-11-29 NOTE — ED Notes (Signed)
Patient transported to X-ray 

## 2011-11-30 ENCOUNTER — Inpatient Hospital Stay (HOSPITAL_COMMUNITY): Payer: Medicare Other

## 2011-11-30 DIAGNOSIS — D696 Thrombocytopenia, unspecified: Secondary | ICD-10-CM

## 2011-11-30 DIAGNOSIS — N390 Urinary tract infection, site not specified: Secondary | ICD-10-CM

## 2011-11-30 DIAGNOSIS — E1165 Type 2 diabetes mellitus with hyperglycemia: Secondary | ICD-10-CM

## 2011-11-30 DIAGNOSIS — E782 Mixed hyperlipidemia: Secondary | ICD-10-CM

## 2011-11-30 LAB — CBC
HCT: 32.2 % — ABNORMAL LOW (ref 36.0–46.0)
MCHC: 32.3 g/dL (ref 30.0–36.0)
Platelets: 237 10*3/uL (ref 150–400)
RDW: 15.6 % — ABNORMAL HIGH (ref 11.5–15.5)
WBC: 16.9 10*3/uL — ABNORMAL HIGH (ref 4.0–10.5)

## 2011-11-30 LAB — BASIC METABOLIC PANEL
BUN: 23 mg/dL (ref 6–23)
Chloride: 101 mEq/L (ref 96–112)
Creatinine, Ser: 1.07 mg/dL (ref 0.50–1.10)
GFR calc Af Amer: 61 mL/min — ABNORMAL LOW (ref >90)
GFR calc non Af Amer: 52 mL/min — ABNORMAL LOW (ref >90)
Potassium: 3.2 mEq/L — ABNORMAL LOW (ref 3.5–5.1)

## 2011-11-30 LAB — GLUCOSE, CAPILLARY
Glucose-Capillary: 155 mg/dL — ABNORMAL HIGH (ref 70–99)
Glucose-Capillary: 149 mg/dL — ABNORMAL HIGH (ref 70–99)
Glucose-Capillary: 151 mg/dL — ABNORMAL HIGH (ref 70–99)

## 2011-11-30 LAB — HEMOGLOBIN A1C: Mean Plasma Glucose: 128 mg/dL — ABNORMAL HIGH (ref <117)

## 2011-11-30 MED ORDER — ONDANSETRON HCL 4 MG PO TABS
4.0000 mg | ORAL_TABLET | Freq: Four times a day (QID) | ORAL | Status: DC | PRN
  Administered 2011-11-30 – 2011-12-01 (×3): 4 mg via ORAL
  Filled 2011-11-30 (×3): qty 1

## 2011-11-30 MED ORDER — POLYETHYLENE GLYCOL 3350 17 G PO PACK
17.0000 g | PACK | Freq: Every day | ORAL | Status: DC
  Administered 2011-11-30 – 2011-12-03 (×4): 17 g via ORAL
  Filled 2011-11-30 (×4): qty 1

## 2011-11-30 MED ORDER — SODIUM CHLORIDE 0.9 % IJ SOLN: 3.0000 mL | Freq: Two times a day (BID) | INTRAMUSCULAR | Status: DC

## 2011-11-30 MED ORDER — DOCUSATE SODIUM 100 MG PO CAPS
100.0000 mg | ORAL_CAPSULE | Freq: Every day | ORAL | Status: DC | PRN
  Filled 2011-11-30: qty 1

## 2011-11-30 MED ORDER — SIMVASTATIN 40 MG PO TABS
40.0000 mg | ORAL_TABLET | Freq: Every evening | ORAL | Status: DC
  Administered 2011-11-30 – 2011-12-02 (×3): 40 mg via ORAL
  Filled 2011-11-30 (×4): qty 1

## 2011-11-30 MED ORDER — ONDANSETRON HCL 4 MG/2ML IJ SOLN: 4.0000 mg | Freq: Four times a day (QID) | INTRAMUSCULAR | Status: DC | PRN

## 2011-11-30 MED ORDER — SODIUM CHLORIDE 0.9 % IJ SOLN
10.0000 mL | INTRAMUSCULAR | Status: DC | PRN
  Administered 2011-12-01: 10 mL

## 2011-11-30 MED ORDER — VANCOMYCIN HCL IN DEXTROSE 1-5 GM/200ML-% IV SOLN
1000.0000 mg | Freq: Two times a day (BID) | INTRAVENOUS | Status: DC
  Administered 2011-11-30 – 2011-12-02 (×5): 1000 mg via INTRAVENOUS
  Filled 2011-11-30 (×6): qty 200

## 2011-11-30 MED ORDER — LOSARTAN POTASSIUM 50 MG PO TABS
100.0000 mg | ORAL_TABLET | Freq: Every day | ORAL | Status: DC
  Administered 2011-11-30 – 2011-12-03 (×4): 100 mg via ORAL
  Filled 2011-11-30 (×4): qty 2

## 2011-11-30 MED ORDER — INSULIN ASPART 100 UNIT/ML ~~LOC~~ SOLN
0.0000 [IU] | Freq: Three times a day (TID) | SUBCUTANEOUS | Status: DC
  Administered 2011-11-30 (×2): 2 [IU] via SUBCUTANEOUS
  Administered 2011-11-30 – 2011-12-01 (×4): 3 [IU] via SUBCUTANEOUS
  Administered 2011-12-02 – 2011-12-03 (×4): 2 [IU] via SUBCUTANEOUS

## 2011-11-30 MED ORDER — ALBUTEROL SULFATE (5 MG/ML) 0.5% IN NEBU: 2.5000 mg | INHALATION_SOLUTION | Freq: Four times a day (QID) | RESPIRATORY_TRACT | Status: DC | PRN

## 2011-11-30 MED ORDER — ONDANSETRON HCL 4 MG/2ML IJ SOLN: 4.0000 mg | Freq: Three times a day (TID) | INTRAMUSCULAR | Status: AC | PRN

## 2011-11-30 MED ORDER — IOHEXOL 300 MG/ML  SOLN
100.0000 mL | Freq: Once | INTRAMUSCULAR | Status: AC | PRN
  Administered 2011-11-30: 100 mL via INTRAVENOUS

## 2011-11-30 MED ORDER — POLYETHYLENE GLYCOL 3350 17 GM/SCOOP PO POWD
17.0000 g | Freq: Every day | ORAL | Status: DC
  Filled 2011-11-30: qty 255

## 2011-11-30 MED ORDER — ACETAMINOPHEN 650 MG RE SUPP: 650.0000 mg | Freq: Four times a day (QID) | RECTAL | Status: DC | PRN

## 2011-11-30 MED ORDER — SODIUM CHLORIDE 0.9 % IV SOLN
1500.0000 mg | Freq: Once | INTRAVENOUS | Status: AC
  Administered 2011-11-30: 1500 mg via INTRAVENOUS
  Filled 2011-11-30: qty 1500

## 2011-11-30 MED ORDER — ASPIRIN EC 81 MG PO TBEC
81.0000 mg | DELAYED_RELEASE_TABLET | ORAL | Status: DC
  Administered 2011-11-30 – 2011-12-03 (×4): 81 mg via ORAL
  Filled 2011-11-30 (×6): qty 1

## 2011-11-30 MED ORDER — HEPARIN SODIUM (PORCINE) 5000 UNIT/ML IJ SOLN
5000.0000 [IU] | Freq: Three times a day (TID) | INTRAMUSCULAR | Status: DC
  Administered 2011-11-30 – 2011-12-03 (×10): 5000 [IU] via SUBCUTANEOUS
  Filled 2011-11-30 (×13): qty 1

## 2011-11-30 MED ORDER — SODIUM CHLORIDE 0.9 % IV SOLN
INTRAVENOUS | Status: DC
  Administered 2011-11-30: 16:00:00 via INTRAVENOUS
  Administered 2011-12-01 – 2011-12-02 (×2): 75 mL/h via INTRAVENOUS

## 2011-11-30 MED ORDER — POTASSIUM CHLORIDE CRYS ER 20 MEQ PO TBCR
30.0000 meq | EXTENDED_RELEASE_TABLET | Freq: Two times a day (BID) | ORAL | Status: DC
  Administered 2011-11-30 – 2011-12-03 (×8): 30 meq via ORAL
  Filled 2011-11-30 (×9): qty 1

## 2011-11-30 MED ORDER — VANCOMYCIN HCL IN DEXTROSE 1-5 GM/200ML-% IV SOLN
1000.0000 mg | Freq: Once | INTRAVENOUS | Status: DC
  Filled 2011-11-30: qty 200

## 2011-11-30 MED ORDER — SODIUM CHLORIDE 0.9 % IJ SOLN: 10.0000 mL | Freq: Two times a day (BID) | INTRAMUSCULAR | Status: DC

## 2011-11-30 MED ORDER — SODIUM CHLORIDE 0.9 % IV SOLN: 250.0000 mL | INTRAVENOUS | Status: DC | PRN

## 2011-11-30 MED ORDER — LORATADINE 10 MG PO TABS
10.0000 mg | ORAL_TABLET | Freq: Every day | ORAL | Status: DC
  Administered 2011-11-30 – 2011-12-03 (×4): 10 mg via ORAL
  Filled 2011-11-30 (×4): qty 1

## 2011-11-30 MED ORDER — SODIUM CHLORIDE 0.9 % IJ SOLN
3.0000 mL | INTRAMUSCULAR | Status: DC | PRN
  Administered 2011-12-01: 3 mL via INTRAVENOUS

## 2011-11-30 MED ORDER — SODIUM CHLORIDE 0.9 % IV SOLN
INTRAVENOUS | Status: AC
  Administered 2011-11-30: 06:00:00 via INTRAVENOUS

## 2011-11-30 MED ORDER — ACETAMINOPHEN 325 MG PO TABS
650.0000 mg | ORAL_TABLET | Freq: Four times a day (QID) | ORAL | Status: DC | PRN
  Administered 2011-11-30 – 2011-12-02 (×7): 650 mg via ORAL
  Filled 2011-11-30 (×7): qty 2

## 2011-11-30 NOTE — ED Notes (Signed)
Sandy RN to return call for report

## 2011-11-30 NOTE — Progress Notes (Deleted)
11-30-11-2245  Pt had large red stool with clots p she had eaten a medium size snack at hs, denies pain, nausea, abd is soft, + bs, denies pain upon palpation, bp is currently 165/85  Hr is 103.  Will continue to monitor

## 2011-11-30 NOTE — Consult Note (Signed)
Reason for Consult:Pyelonephritis, h/o ureteral stricture and nephrolithiasis, h/o urinary diversion  Referring Physician: [facility]  Jordan [name] is an 67 y.o. female.   HPI:  1 - Fever, Pyelonephritis - Pt with 2 day prodrome of fevers, and malaise culminating in fevers to 103 and back pain yesterday worrisome for pyelonephritis. Seen in ER and hospitalist admitted. Labs with luekocytosis, UA c/w poss infection. Fever curve now trending down with ABX. Pain was mild and R>L.   2 - Nephrolithiasis - h/o right ureteral stone s/p percutaneous nephrostolithotomy and anterograde ureteroscopy 09/2011 by Dr. Retta Diones. F/u imaging w/o sig stones.  3 - Rt Ureteral Stricture - Possible stricture found during most recent round of stone surgery. Had anterograde stenting from 09/2011 to 10/2011 and then removed. F/u US without rt hydro.  4 - Neurogenic Bladder / Urinary Diversion - s/p ileal conduit 1992 for neurogenic bladder.  Today we see Jordan [name] for consultation of above. She is feeling better since initial admission.   Past Medical History  Diagnosis Date  . HYPERLIPIDEMIA 11/29/2009  . HYPERTENSION 11/29/2009  . COLONIC POLYPS 07/11/2010  . CEREBRAL PALSY 07/11/2010    PT CAN TRANSFER BED TO CHAIR--CAN STAND BRIEFLY--BUT NOT ABLE TO WALK; RT LEG HYPEREXTENDS BACKWARD AND IS WEAK-HAS A POWER CHAIR  . Diabetes mellitus     ORAL MED FOR DIABETES  . Blood transfusion 1992    AFTER  SURGERY TO REMOVE BLADDER  . GERD (gastroesophageal reflux disease)     PAST HISTORY GERD--WATCHES DIET - NOT ON ANY MEDS FOR GERD  . Incisional hernia     NO PAIN-NOT CAUSING ANY DISCOMFORT  . Arthritis     OA  . PONV (postoperative nausea and vomiting)     Past Surgical History  Procedure Date  . Appendectomy   . Cholecystectomy   . Tonsillectomy   . Revision urostomy cutaneous 1992  . Abdominal hysterectomy 1990    partial  . Radical cystectomy 1992    neurogenic bladder sec CP  . Rt ankle  fusion 1975    SEVERAL RT ANKLE SURGERIES PRIOR TO THE FUSION  . Left carpal tunnel release x 2   . Right carpal tunnel release   . Lumbar sympathetectomies x 2   . 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 Nephrolithotomy 10/01/2011    Procedure: NEPHROLITHOTOMY PERCUTANEOUS;  Surgeon: Marcine Matar, MD;  Location: [facility];  Service: Urology;  Laterality: Right;         Family History  Problem Relation Age of Onset  . Heart disease Mother 46    COPD  . Heart disease Father     COPD   . Emphysema Father   . Cancer Father     bladder, colon    Social History:  reports that she has never smoked. She has never used smokeless tobacco. She reports that she does not drink alcohol or use illicit drugs.  Allergies:  Allergies  Allergen Reactions  . Morphine Anaphylaxis  . Baclofen     GI upset  . Lisinopril Cough  . Metoclopramide Hcl     Reglan, irritable  . Penicillins     " MAJOR CASE OF HIVES"    Medications: I have reviewed the patient's current medications.  Results for orders placed during the hospital encounter of 11/29/11 (from the past 48 hour(s))  CBC     Status: Abnormal   Collection Time   11/29/11  8:35 PM      Component Value  Range Comment   WBC 15.1 (*) 4.0 - 10.5 K/uL    RBC 4.71  3.87 - 5.11 MIL/uL    Hemoglobin 13.2  12.0 - 15.0 g/dL    HCT 41.3  24.4 - 01.0 %    MCV 87.3  78.0 - 100.0 fL    MCH 28.0  26.0 - 34.0 pg    MCHC 32.1  30.0 - 36.0 g/dL    RDW 27.2  53.6 - 64.4 %    Platelets 248  150 - 400 K/uL   DIFFERENTIAL     Status: Abnormal   Collection Time   11/29/11  8:35 PM      Component Value Range Comment   Neutrophils Relative 88 (*) 43 - 77 %    Neutro Abs 13.3 (*) 1.7 - 7.7 K/uL    Lymphocytes Relative 7 (*) 12 - 46 %    Lymphs Abs 1.1  0.7 - 4.0 K/uL    Monocytes Relative 5  3 - 12 %    Monocytes Absolute 0.8  0.1 - 1.0 K/uL    Eosinophils Relative 0  0 - 5 %    Eosinophils Absolute 0.0  0.0 - 0.7 K/uL    Basophils  Relative 0  0 - 1 %    Basophils Absolute 0.0  0.0 - 0.1 K/uL   COMPREHENSIVE METABOLIC PANEL     Status: Abnormal   Collection Time   11/29/11  8:35 PM      Component Value Range Comment   Sodium 136  135 - 145 mEq/L    Potassium 3.2 (*) 3.5 - 5.1 mEq/L    Chloride 99  96 - 112 mEq/L    CO2 22  19 - 32 mEq/L    Glucose, Bld 170 (*) 70 - 99 mg/dL    BUN 24 (*) 6 - 23 mg/dL    Creatinine, Ser 0.34  0.50 - 1.10 mg/dL    Calcium 9.4  8.4 - 74.2 mg/dL    Total Protein 7.1  6.0 - 8.3 g/dL    Albumin 3.2 (*) 3.5 - 5.2 g/dL    AST 27  0 - 37 U/L    ALT 44 (*) 0 - 35 U/L    Alkaline Phosphatase 84  39 - 117 U/L    Total Bilirubin 0.6  0.3 - 1.2 mg/dL    GFR calc non Af Amer 51 (*) >90 mL/min    GFR calc Af Amer 59 (*) >90 mL/min   LACTIC ACID, PLASMA     Status: Normal   Collection Time   11/29/11  8:35 PM      Component Value Range Comment   Lactic Acid, Venous 1.4  0.5 - 2.2 mmol/L   HEMOGLOBIN A1C     Status: Abnormal   Collection Time   11/29/11  8:35 PM      Component Value Range Comment   Hemoglobin A1C 6.1 (*) <5.7 %    Mean Plasma Glucose 128 (*) <117 mg/dL   URINALYSIS, ROUTINE W REFLEX MICROSCOPIC     Status: Abnormal   Collection Time   11/29/11  9:38 PM      Component Value Range Comment   Color, Urine AMBER (*) YELLOW BIOCHEMICALS MAY BE AFFECTED BY COLOR   APPearance TURBID (*) CLEAR    Specific Gravity, Urine 1.017  1.005 - 1.030    pH 8.5 (*) 5.0 - 8.0    Glucose, UA NEGATIVE  NEGATIVE mg/dL    Hgb urine dipstick  TRACE (*) NEGATIVE    Bilirubin Urine NEGATIVE  NEGATIVE    Ketones, ur NEGATIVE  NEGATIVE mg/dL    Protein, ur 161 (*) NEGATIVE mg/dL    Urobilinogen, UA 0.2  0.0 - 1.0 mg/dL    Nitrite POSITIVE (*) NEGATIVE    Leukocytes, UA LARGE (*) NEGATIVE   URINE MICROSCOPIC-ADD ON     Status: Abnormal   Collection Time   11/29/11  9:38 PM      Component Value Range Comment   WBC, UA TOO NUMEROUS TO COUNT  <3 WBC/hpf    RBC / HPF 0-2  <3 RBC/hpf    Bacteria,  UA MANY (*) RARE   GLUCOSE, CAPILLARY     Status: Abnormal   Collection Time   11/30/11  4:24 AM      Component Value Range Comment   Glucose-Capillary 155 (*) 70 - 99 mg/dL   CBC     Status: Abnormal   Collection Time   11/30/11  5:40 AM      Component Value Range Comment   WBC 16.9 (*) 4.0 - 10.5 K/uL    RBC 3.67 (*) 3.87 - 5.11 MIL/uL    Hemoglobin 10.4 (*) 12.0 - 15.0 g/dL    HCT 09.6 (*) 04.5 - 46.0 %    MCV 87.7  78.0 - 100.0 fL    MCH 28.3  26.0 - 34.0 pg    MCHC 32.3  30.0 - 36.0 g/dL    RDW 40.9 (*) 81.1 - 15.5 %    Platelets 237  150 - 400 K/uL   BASIC METABOLIC PANEL     Status: Abnormal   Collection Time   11/30/11  5:40 AM      Component Value Range Comment   Sodium 134 (*) 135 - 145 mEq/L    Potassium 3.2 (*) 3.5 - 5.1 mEq/L    Chloride 101  96 - 112 mEq/L    CO2 20  19 - 32 mEq/L    Glucose, Bld 165 (*) 70 - 99 mg/dL    BUN 23  6 - 23 mg/dL    Creatinine, Ser 9.14  0.50 - 1.10 mg/dL    Calcium 8.8  8.4 - 78.2 mg/dL    GFR calc non Af Amer 52 (*) >90 mL/min    GFR calc Af Amer 61 (*) >90 mL/min   GLUCOSE, CAPILLARY     Status: Abnormal   Collection Time   11/30/11  7:50 AM      Component Value Range Comment   Glucose-Capillary 162 (*) 70 - 99 mg/dL    Comment 1 Documented in Chart      Comment 2 Notify RN     GLUCOSE, CAPILLARY     Status: Abnormal   Collection Time   11/30/11 12:33 PM      Component Value Range Comment   Glucose-Capillary 149 (*) 70 - 99 mg/dL    Comment 1 Documented in Chart      Comment 2 Notify RN       Dg Chest 2 View  11/29/2011  *RADIOLOGY REPORT*  Clinical Data: Fever.  Nonsmoker.  CHEST - 2 VIEW  Comparison: 09/25/2011.  Findings: Question right base infiltrate. On the frontal view this looks like atelectasis but on the lateral view is suspicious for infiltrate given the history.  This can be evaluated follow-up.  Mild cardiomegaly.  Central pulmonary vascular prominence. Minimally tortuous aorta.  IMPRESSION: Question right base  infiltrate as noted above.  Original Report  Authenticated By: Fuller Canada, M.D.    Review of Systems  Constitutional: Positive for fever and malaise/fatigue.  HENT: Negative.   Eyes: Negative.   Respiratory: Negative.   Cardiovascular: Negative.   Gastrointestinal: Negative.   Genitourinary: Negative.  Negative for hematuria.  Musculoskeletal: Negative.   Skin: Negative.   Neurological: Negative.   Endo/Heme/Allergies: Negative.   Psychiatric/Behavioral: Negative.    Blood pressure 102/65, pulse 80, temperature 98 F (36.7 C), temperature source Oral, resp. rate 20, height 5\' 9"  (1.753 m), weight 107.684 kg (237 lb 6.4 oz), SpO2 96.00%. Physical Exam  Constitutional: She is oriented to person, place, and time. She appears well-developed and well-nourished.       Obese, very pleasant, friends in room.  HENT:  Head: Normocephalic and atraumatic.  Eyes: Pupils are equal, round, and reactive to light.  Neck: Normal range of motion. Neck supple.  Cardiovascular: Normal rate.   Respiratory: Effort normal.  GI: Soft. Bowel sounds are normal.       Left sided urostomy pink and patent with clear urine, small amt mucus as expected.  Genitourinary:       Mild Rt CVAT. No Lt CVAT.  Musculoskeletal: Normal range of motion.  Neurological: She is alert and oriented to person, place, and time.  Skin: Skin is warm and dry.  Psychiatric: She has a normal mood and affect. Her behavior is normal. Judgment and thought content normal.    Assessment/Plan:  1 - Fever, Pyelonephritis - Fever curve improving on current ABX, agree with regimen awaiting specificity results.  Need to r/o sig urinary obstruction given h/o possible stricture. CT Urogram pending.  2 - Nephrolithiasis - most recent imaging w/o sig stones. New imaging to be performed tonight.  3 - Rt Ureteral Stricture - Most recent US favorable for resolution, new imaging tonight to r/o new hydro.  4 - Neurogenic Bladder / Urinary  Diversion -  Doing well at baseline with conduit.  5 - We will f/u on above imaging and follow the patient with you.  Jerica Creegan 11/30/2011, 4:59 PM

## 2011-11-30 NOTE — Progress Notes (Signed)
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 Berneice Heinrich, urology 6/16   Procedures:  None so far  UC done 6/15  Antibiotics:  Levaquin 6/14  Vancomycin 6/14   Subjective  Feels better but still is having fever. Able to tolerate by mouth liquids and fluids. No shortness of breath or chest pain no nausea or vomiting at present time   Objective    Interim History: Reviewed old notes  Subjective:   Objective: Filed Vitals:   11/30/11 0015 11/30/11 0415 11/30/11 0500 11/30/11 1100  BP: 120/55 102/56    Pulse: 95 102    Temp: 99.5 F (37.5 C) 102.6 F (39.2 C) 99.1 F (37.3 C) 100.4 F (38 C)  TempSrc: Oral Oral  Oral  Resp: 22 20    Height:  5\' 9"  (1.753 m)    Weight:  107.684 kg (237 lb 6.4 oz)    SpO2: 100% 93%      Intake/Output Summary (Last 24 hours) at 11/30/11 1140 Last data filed at 11/30/11 1021  Gross per 24 hour  Intake    680 ml  Output    450 ml  Net    230 ml    Exam:  General: Pleasant Caucasian female looking about stated age, morbidly obese Cardiovascular: S1-S2 no murmur rub or gallop Respiratory: Clinically clear no added sound Abdomen: Soft nontender nondistended-ileostomy present Skin no rash Neuro grossly intact  Data Reviewed: Basic Metabolic Panel:  Lab 11/30/11 2952 11/29/11 2035   NA 134* 136  K 3.2* 3.2*  CL 101 99  CO2 20 22  GLUCOSE 165* 170*  BUN 23 24*  CREATININE 1.07 1.09  CALCIUM 8.8 9.4  MG -- --  PHOS -- --   Liver Function Tests:  Lab 11/29/11 2035  AST 27  ALT 44*  ALKPHOS 84  BILITOT 0.6  PROT 7.1  ALBUMIN 3.2*   No results found for this basename: LIPASE:5,AMYLASE:5 in the last 168 hours No results found for this basename: AMMONIA:5 in the last 168 hours CBC:  Lab 11/30/11 0540 11/29/11 2035  WBC 16.9* 15.1*  NEUTROABS -- 13.3*  HGB 10.4* 13.2  HCT 32.2* 41.1  MCV 87.7 87.3  PLT 237 248   Cardiac Enzymes: No results found for this basename: CKTOTAL:5,CKMB:5,CKMBINDEX:5,TROPONINI:5 in the last 168 hours BNP: No components found with this basename: POCBNP:5 CBG:  Lab 11/30/11 0750 11/30/11 0424  GLUCAP 162* 155*    No results found for this or any previous visit (from the past 240 hour(s)).   Studies:              All Imaging reviewed and is as per above notation   Scheduled Meds:   . sodium chloride   Intravenous STAT  . acetaminophen  650 mg Oral Once  . aspirin EC  81 mg Oral BH-q7a  . heparin  5,000 Units Subcutaneous Q8H  . insulin aspart  0-15 Units Subcutaneous TID WC  . levofloxacin (LEVAQUIN) IV  750 mg Intravenous Q24H  . loratadine  10 mg Oral Daily  . losartan  100 mg Oral Q breakfast  . polyethylene glycol  17 g Oral Daily  . potassium chloride  30 mEq Oral BID  . simvastatin  40 mg Oral QPM  . sodium chloride  1,000 mL Intravenous Once  . sodium chloride  3 mL Intravenous Q12H  . vancomycin  1,500 mg Intravenous Once  . vancomycin  1,000 mg Intravenous Q12H  . DISCONTD: polyethylene glycol powder  17 g Oral Daily  . DISCONTD: vancomycin  1,000 mg Intravenous Once   Continuous Infusions:    Assessment/Plan: 1. Sepsis likely secondary to urinary origin versus complex urological issue-patient to continue Levaquin and vancomycin per pharmacy. Continue symptomatic treatment with Tylenol. I did  consult Dr. Berneice Heinrich of urology who recommends a CT urogram which has been ordered-he will see patient later today and consult-input appreciated in advance.  We'll keep close watch on urine output-she appears to put out only 700 cc since admission and will probably need to rule out blockage given this issue--would not use Lasix at this stage given the fact that she may have a large stone causing blockade, and deferred to urologist regarding further recommendations--PICC line will need to be obtained given she hasvery poor access 2. Type 2 diabetes mellitus-CBGs ranging 150/149-continue sliding scale insulin moderate sensitivity-hold metformin for now 3. Hyperlipidemia-continue Zocor 40 mg + omega-3 fatty acids 4. Stage I chronic kidney disease-baseline creatinine about 0.7 on 4/13-patient on losartan 100 mg, Lasix 80 mg twice daily [it has been held]-creatinine any worse would decrease ARB dosage 5. History cerebral palsy-stable 6. History peptic ulcer disease-continue PPI 7. Hypertension-slightly low blood pressures. Continue only losartan as needed  Code Status: Full  Family Communication: Spoke with husband in room Disposition Plan: Inpatient   Pleas Koch, MD  [facility] Pager [phone] 11/30/2011, 11:40 AM    LOS: 1 day

## 2011-11-30 NOTE — Progress Notes (Signed)
ANTIBIOTIC CONSULT NOTE - INITIAL  Pharmacy Consult for vancomycin/levofloxacin Indication: pneumonia  Allergies  Allergen Reactions  . Morphine Anaphylaxis  . Baclofen     GI upset  . Lisinopril Cough  . Metoclopramide Hcl     Reglan, irritable  . Penicillins     " MAJOR CASE OF HIVES"    Patient Measurements: Height: 5\' 9"  (175.3 cm) Weight: 237 lb 6.4 oz (107.684 kg) IBW/kg (Calculated) : 66.2  Adjusted Body Weight:   Vital Signs: Temp: 102.6 F (39.2 C) (06/16 0415) Temp src: Oral (06/16 0415) BP: 102/56 mmHg (06/16 0415) Pulse Rate: 102  (06/16 0415) Intake/Output from previous day: 06/15 0701 - 06/16 0700 In: 440 [P.O.:240; I.V.:200] Out: 450 [Urine:450] Intake/Output from this shift: Total I/O In: 440 [P.O.:240; I.V.:200] Out: 450 [Urine:450]  Labs:  [facility] 11/29/11 2035  WBC 15.1*  HGB 13.2  PLT 248  LABCREA --  CREATININE 1.09   Estimated Creatinine Clearance: 65.5 ml/min (by C-G formula based on Cr of 1.09). No results found for this basename: VANCOTROUGH:2,VANCOPEAK:2,VANCORANDOM:2,GENTTROUGH:2,GENTPEAK:2,GENTRANDOM:2,TOBRATROUGH:2,TOBRAPEAK:2,TOBRARND:2,AMIKACINPEAK:2,AMIKACINTROU:2,AMIKACIN:2, in the last 72 hours   Microbiology: No results found for this or any previous visit (from the past 720 hour(s)).  Medical History: Past Medical History  Diagnosis Date  . HYPERLIPIDEMIA 11/29/2009  . HYPERTENSION 11/29/2009  . COLONIC POLYPS 07/11/2010  . CEREBRAL PALSY 07/11/2010    PT CAN TRANSFER BED TO CHAIR--CAN STAND BRIEFLY--BUT NOT ABLE TO WALK; RT LEG HYPEREXTENDS BACKWARD AND IS WEAK-HAS A POWER CHAIR  . Diabetes mellitus     ORAL MED FOR DIABETES  . Blood transfusion 1992    AFTER  SURGERY TO REMOVE BLADDER  . GERD (gastroesophageal reflux disease)     PAST HISTORY GERD--WATCHES DIET - NOT ON ANY MEDS FOR GERD  . Incisional hernia     NO PAIN-NOT CAUSING ANY DISCOMFORT  . Arthritis     OA  . PONV (postoperative nausea and vomiting)       Medications:  Anti-infectives     Start     Dose/Rate Route Frequency Ordered Stop   11/30/11 1000   vancomycin (VANCOCIN) IVPB 1000 mg/200 mL premix        1,000 mg 200 mL/hr over 60 Minutes Intravenous Every 12 hours 11/30/11 0547     11/30/11 0100   vancomycin (VANCOCIN) IVPB 1000 mg/200 mL premix  Status:  Discontinued        1,000 mg 200 mL/hr over 60 Minutes Intravenous  Once 11/30/11 0049 11/30/11 0050   11/30/11 0100   vancomycin (VANCOCIN) 1,500 mg in sodium chloride 0.9 % 500 mL IVPB        1,500 mg 250 mL/hr over 120 Minutes Intravenous  Once 11/30/11 0050 11/30/11 0334   11/29/11 2130   levofloxacin (LEVAQUIN) IVPB 750 mg        750 mg 100 mL/hr over 90 Minutes Intravenous Every 24 hours 11/29/11 2117           Assessment: Patient with PNA.    Goal of Therapy:  Vancomycin trough level 15-20 mcg/ml levofloxacin dosed based on patient weight and renal function   Plan:  Measure antibiotic drug levels at steady state Follow up culture results Vancomycin 1500mg  iv x1, then 1gm iv q12hr Levofloxacin 750mg  iv q24hr  Darlina Guys, Jacquenette Shone Crowford 11/30/2011,5:47 AM
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
## 2011-11-30 NOTE — Progress Notes (Signed)
11-30-11 Pt arrived on floor at 0415 temp elevated again to 102-6, tylenol given, M lynch notified of arrival, temp at 0630 is  99.1

## 2011-12-01 ENCOUNTER — Encounter (HOSPITAL_COMMUNITY): Payer: Self-pay | Admitting: *Deleted

## 2011-12-01 DIAGNOSIS — E782 Mixed hyperlipidemia: Secondary | ICD-10-CM

## 2011-12-01 DIAGNOSIS — D696 Thrombocytopenia, unspecified: Secondary | ICD-10-CM

## 2011-12-01 DIAGNOSIS — IMO0001 Reserved for inherently not codable concepts without codable children: Secondary | ICD-10-CM

## 2011-12-01 DIAGNOSIS — E1165 Type 2 diabetes mellitus with hyperglycemia: Secondary | ICD-10-CM

## 2011-12-01 DIAGNOSIS — N390 Urinary tract infection, site not specified: Secondary | ICD-10-CM

## 2011-12-01 LAB — DIFFERENTIAL
Basophils Absolute: 0 10*3/uL (ref 0.0–0.1)
Basophils Relative: 0 % (ref 0–1)
Eosinophils Absolute: 0 10*3/uL (ref 0.0–0.7)
Lymphocytes Relative: 14 % (ref 12–46)
Monocytes Absolute: 0.8 10*3/uL (ref 0.1–1.0)
Neutrophils Relative %: 79 % — ABNORMAL HIGH (ref 43–77)

## 2011-12-01 LAB — CBC
MCH: 27.7 pg (ref 26.0–34.0)
MCHC: 31.6 g/dL (ref 30.0–36.0)
Platelets: 239 10*3/uL (ref 150–400)
RDW: 15.8 % — ABNORMAL HIGH (ref 11.5–15.5)

## 2011-12-01 LAB — GLUCOSE, CAPILLARY
Glucose-Capillary: 165 mg/dL — ABNORMAL HIGH (ref 70–99)
Glucose-Capillary: 137 mg/dL — ABNORMAL HIGH (ref 70–99)

## 2011-12-01 LAB — BASIC METABOLIC PANEL
Calcium: 8.6 mg/dL (ref 8.4–10.5)
GFR calc Af Amer: 69 mL/min — ABNORMAL LOW (ref >90)
GFR calc non Af Amer: 59 mL/min — ABNORMAL LOW (ref >90)
Glucose, Bld: 145 mg/dL — ABNORMAL HIGH (ref 70–99)
Potassium: 4.2 mEq/L (ref 3.5–5.1)
Sodium: 133 mEq/L — ABNORMAL LOW (ref 135–145)

## 2011-12-01 NOTE — ED Provider Notes (Signed)
Medical screening examination/treatment/procedure(s) were performed by non-physician practitioner and as supervising physician I was immediately available for consultation/collaboration.  Flint Melter, MD 12/01/11 [phone]

## 2011-12-01 NOTE — Progress Notes (Signed)
Subjective: Patient reports some nausea. No emesis.  Objective: Vital signs in last 24 hours: Temp:  [98 F (36.7 C)-100.4 F (38 C)] 99 F (37.2 C) (06/17 0625) Pulse Rate:  [78-103] 78  (06/17 0625) Resp:  [20] 20  (06/17 0625) BP: (102-165)/(65-85) 105/69 mmHg (06/17 0625) SpO2:  [94 %-96 %] 95 % (06/17 0625) Weight:  [111.1 kg (244 lb 14.9 oz)] 111.1 kg (244 lb 14.9 oz) (06/17 0625)  Intake/Output from previous day: 06/16 0701 - 06/17 0700 In: 2895 [P.O.:1320; I.V.:1575] Out: 3100 [Urine:3100] Intake/Output this shift:    Physical Exam:  Constitutional: Vital signs reviewed. WD WN in NAD     Lab Results:  Basename 12/01/11 0550 11/30/11 0540 11/29/11 2035  HGB 9.9* 10.4* 13.2  HCT 31.3* 32.2* 41.1   BMET  Basename 12/01/11 0550 11/30/11 0540  NA 133* 134*  K 4.2 3.2*  CL 101 101  CO2 21 20  GLUCOSE 145* 165*  BUN 19 23  CREATININE 0.97 1.07  CALCIUM 8.6 8.8   No results found for this basename: LABPT:3,INR:3 in the last 72 hours No results found for this basename: LABURIN:1 in the last 72 hours Results for orders placed during the hospital encounter of 11/29/11  CULTURE, BLOOD (ROUTINE X 2)     Status: Normal (Preliminary result)   Collection Time   11/29/11  8:35 PM      Component Value Range Status Comment   Specimen Description BLOOD LEFT HAND   Final    Special Requests BOTTLES DRAWN AEROBIC AND ANAEROBIC 5CC   Final    Culture  Setup Time [id number]   Final    Culture     Final    Value:        BLOOD CULTURE RECEIVED NO GROWTH TO DATE CULTURE WILL BE HELD FOR 5 DAYS BEFORE ISSUING A FINAL NEGATIVE REPORT   Report Status PENDING   Incomplete   CULTURE, BLOOD (ROUTINE X 2)     Status: Normal (Preliminary result)   Collection Time   11/29/11  9:00 PM      Component Value Range Status Comment   Specimen Description BLOOD LEFT ARM   Final    Special Requests BOTTLES DRAWN AEROBIC ONLY [facility]   Final    Culture  Setup Time [id number]   Final    Culture     Final    Value:        BLOOD CULTURE RECEIVED NO GROWTH TO DATE CULTURE WILL BE HELD FOR 5 DAYS BEFORE ISSUING A FINAL NEGATIVE REPORT   Report Status PENDING   Incomplete   URINE CULTURE     Status: Normal (Preliminary result)   Collection Time   11/29/11  9:38 PM      Component Value Range Status Comment   Specimen Description URINE, RANDOM   Final    Special Requests Normal   Final    Culture  Setup Time [id number]   Final    Colony Count PENDING   Incomplete    Culture Culture reincubated for better growth   Final    Report Status PENDING   Incomplete     Studies/Results: Ct Abdomen Pelvis W Wo Contrast  11/30/2011  *RADIOLOGY REPORT*  Clinical Data: Fevers.  Urinary tract infection.  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  Elevated white blood cell count of 16.9.  CT ABDOMEN AND PELVIS WITHOUT AND WITH CONTRAST  Technique:  Multidetector CT imaging of the abdomen and pelvis was performed without contrast  material in one or both body regions, followed by contrast material(s) and further sections in one or both body regions.  Contrast: OMNIPAQUE IOHEXOL 300 MG/ML  SOLN  Comparison: 09/25/2011.  Findings: Lung Bases: Dependent atelectasis at the lung bases. Mild elevation of the right hemidiaphragm.  Liver:  Normal.  Spleen:  Normal.  Gallbladder:  Cholecystectomy.  Common bile duct:  Normal.  Pancreas:  Normal.  Adrenal glands:  Normal.  Kidneys:  Punctate right inferior pole renal collecting system calculus.  Right perinephric stranding with patchy delayed enhancement of the right kidney relative to the left.  Findings suggest pyelonephritis.  Cyst is present in the left kidney.  The right ureter demonstrates thickening of the pelvis and proximal left ureter. The distal right ureter is collapsed.  There is no obstruction.  Ileal conduit is noted.  The left ureter opacifies normally, identifying the location of the ileal conduit. Hysterectomy. Small parastomal  hernia. Distal right ureteral stone seen on prior exam 09/25/2011 has either passed or been retrieved.  Stomach:  Normal.  Decompressed.  Small bowel:  Small bowel appears within normal limits.  Colon:   Prominent stool is present in the colon distally  Pelvic Genitourinary:  Elongated tubular cystic lesion is present in the left anatomic pelvis, probably representing lymphocele.  No aggressive osseous lesions.  Multilevel lumbar spondylosis.  Bones:  No aggressive osseous lesions are present.  Vasculature: Surgical clips posterior to the IVC in the abdomen.  IMPRESSION: 1.  Right pyelonephritis.  Poor enhancement, swelling and perinephric stranding of the right kidney without evidence of obstruction compatible with ascending urinary tract infection. 2.  Mural thickening of the proximal right ureter is also compatible with ascending urinary tract infection. 3.  Cystectomy with ileal conduit. 4.  Cholecystectomy and hysterectomy. 5.  Probable lymphocele in the left anatomic pelvis. 6.  Nonobstructing right inferior pole renal collecting system calculus. Passage or retrieval of previously seen distal right ureteral calculus.  Original Report Authenticated By: Andreas Newport, M.D.   Dg Chest 2 View  11/29/2011  *RADIOLOGY REPORT*  Clinical Data: Fever.  Nonsmoker.  CHEST - 2 VIEW  Comparison: 09/25/2011.  Findings: Question right base infiltrate. On the frontal view this looks like atelectasis but on the lateral view is suspicious for infiltrate given the history.  This can be evaluated follow-up.  Mild cardiomegaly.  Central pulmonary vascular prominence. Minimally tortuous aorta.  IMPRESSION: Question right base infiltrate as noted above.  Original Report Authenticated By: Fuller Canada, M.D.    Assessment/Plan:   Right-sided pyelonephritis. CT scan shows a small lower pole stone but no evidence of obstruction. She is improving clinically, but still has some nausea    We will continue IV hydration,  continue antibiotic administration, and follow her daily.   LOS: 2 days   Marcine Matar M 12/01/2011, 1:41 PM

## 2011-12-01 NOTE — Progress Notes (Signed)
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 Berneice Heinrich, urology 6/16   Procedures:  None so far  UC done 6/15-pending  CT urogram 6/16 = right pyelonephritis +R inferior pole calculus  Antibiotics:  Levaquin 6/14  Vancomycin 6/14   Subjective  Feels better but still is having fever.  Pain 5-6/10 in back and stomach Able to tolerate by mouth liquids, but feeling nauseous when she eats Sitting oob right now No blurred vision-double vision-weakness-chest pain-headache-shortness of breath   Objective    Interim History: Reviewed old notes  Subjective:   Objective: Filed Vitals:   11/30/11 2100 11/30/11 2200 12/01/11 0625 12/01/11 1421  BP: 165/85  105/69 141/64  Pulse: 103  78 86  Temp:  99 F (37.2 C) 99 F (37.2 C) 100.4 F (38 C)  TempSrc:   Oral Oral  Resp:   20 20  Height:      Weight:   111.1 kg (244 lb 14.9 oz)   SpO2:   95% 97%    Intake/Output Summary (Last 24 hours) at 12/01/11 1502 Last data filed at 12/01/11 1300  Gross per 24 hour  Intake   2655 ml  Output   3000 ml  Net   -345 ml    Exam:  General: Pleasant Caucasian female looking about stated age, morbidly obese Cardiovascular: S1-S2 no murmur rub or gallop Respiratory: Clinically clear no added sound Abdomen: Soft nontender  nondistended-ileostomy present, mild R CVA tenderness Skin no rash Neuro grossly intact  Data Reviewed: Basic Metabolic Panel:  Lab 12/01/11 9562 11/30/11 0540 11/29/11 2035  NA 133* 134* 136  K 4.2 3.2* --  CL 101 101 99  CO2 21 20 22   GLUCOSE 145* 165* 170*  BUN 19 23 24*  CREATININE 0.97 1.07 1.09  CALCIUM 8.6 8.8 9.4  MG -- -- --  PHOS -- -- --   Liver Function Tests:  Lab 11/29/11 2035  AST 27  ALT 44*  ALKPHOS 84  BILITOT 0.6  PROT 7.1  ALBUMIN 3.2*   No results found for this basename: LIPASE:5,AMYLASE:5 in the last 168 hours No results found for this basename: AMMONIA:5 in the last 168 hours CBC:  Lab 12/01/11 0550 11/30/11 0540 11/29/11 2035  WBC 11.4* 16.9* 15.1*  NEUTROABS 9.0* -- 13.3*  HGB 9.9* 10.4* 13.2  HCT 31.3* 32.2* 41.1  MCV 87.4 87.7 87.3  PLT 239 237 248   Cardiac Enzymes: No results found for this basename: CKTOTAL:5,CKMB:5,CKMBINDEX:5,TROPONINI:5 in the last 168 hours BNP: No components found with this basename: POCBNP:5 CBG:  Lab 12/01/11 1143 12/01/11 0741 11/30/11 2037 11/30/11 1714 11/30/11 1233  GLUCAP 165* 160* 151* 150* 149*    Recent Results (from the past 240 hour(s))  CULTURE, BLOOD (ROUTINE X 2)     Status: Normal (Preliminary result)   Collection Time   11/29/11  8:35 PM      Component Value Range Status Comment   Specimen Description BLOOD LEFT HAND  Final    Special Requests BOTTLES DRAWN AEROBIC AND ANAEROBIC 5CC   Final    Culture  Setup Time [id number]   Final    Culture     Final    Value:        BLOOD CULTURE RECEIVED NO GROWTH TO DATE CULTURE WILL BE HELD FOR 5 DAYS BEFORE ISSUING A FINAL NEGATIVE REPORT   Report Status PENDING   Incomplete   CULTURE, BLOOD (ROUTINE X 2)     Status: Normal (Preliminary result)   Collection Time   11/29/11  9:00 PM      Component Value Range Status Comment   Specimen Description BLOOD LEFT ARM   Final    Special Requests BOTTLES DRAWN AEROBIC ONLY [employer]   Final    Culture   Setup Time [id number]   Final    Culture     Final    Value:        BLOOD CULTURE RECEIVED NO GROWTH TO DATE CULTURE WILL BE HELD FOR 5 DAYS BEFORE ISSUING A FINAL NEGATIVE REPORT   Report Status PENDING   Incomplete   URINE CULTURE     Status: Normal (Preliminary result)   Collection Time   11/29/11  9:38 PM      Component Value Range Status Comment   Specimen Description URINE, RANDOM   Final    Special Requests Normal   Final    Culture  Setup Time [id number]   Final    Colony Count PENDING   Incomplete    Culture Culture reincubated for better growth   Final    Report Status PENDING   Incomplete      Studies:              All Imaging reviewed and is as per above notation   Scheduled Meds:    . sodium chloride   Intravenous STAT  . aspirin EC  81 mg Oral BH-q7a  . heparin  5,000 Units Subcutaneous Q8H  . insulin aspart  0-15 Units Subcutaneous TID WC  . levofloxacin (LEVAQUIN) IV  750 mg Intravenous Q24H  . loratadine  10 mg Oral Daily  . losartan  100 mg Oral Q breakfast  . polyethylene glycol  17 g Oral Daily  . potassium chloride  30 mEq Oral BID  . simvastatin  40 mg Oral QPM  . sodium chloride  10-40 mL Intracatheter Q12H  . sodium chloride  3 mL Intravenous Q12H  . vancomycin  1,000 mg Intravenous Q12H   Continuous Infusions:    . sodium chloride 75 mL/hr (12/01/11 0911)     Assessment/Plan: 1. Sepsis likely secondary to urinary origin-patient to continue Levaquin and vancomycin per pharmacy. Continue symptomatic treatment with Tylenol. I did consult Dr. Berneice Heinrich of urology--CT scan abdomen ordered per urology 6/16 requests showed right pyelonephritis with poor enhancement swelling and perinephric stranding right kidney without obstruction compatible with ascending urinary tract infection and thickening of right ureter. There is a nonobstructing right inferior pole calculus.  We'll keep close watch on urine output-she appears to put out only 700 cc on first day of  admission however with PICC line placed and increased IV fluids her output is 3.1 L over the past 24 hours 6/16-6/17.  Count has decreased from admission.  Has still had temperature spikes-continue Tylenol 2. Type 2 diabetes mellitus-CBGs ranging 150/149-continue sliding scale insulin moderate sensitivity-hold metformin for now 3. Hyperlipidemia-continue Zocor 40 mg + omega-3 fatty acids 4. Stage I chronic kidney disease-baseline  creatinine about 0.7 on 4/13-patient on losartan 100 mg, Lasix 80 mg twice daily [it has been held]-creatinine any worse would decrease ARB dosage 5. History cerebral palsy-stable 6. History peptic ulcer disease-continue PPI 7. Hypokalemia-resolved with replacement-continue Klor-Con 30 mEq twice a day 8. Likely dilutional anemia-hemoglobin dropped to 9.9 from 13.2. Repeat CBC in a.m. 9. Hypertension-slightly low blood pressures. Continue only losartan as needed  Code Status: Full  Family Communication: none in room Disposition Plan: Inpatient   Pleas Koch, MD  [facility] Pager [phone] 12/01/2011, 3:02 PM    LOS: 2 days

## 2011-12-02 ENCOUNTER — Ambulatory Visit: Payer: Medicare Other | Admitting: Family Medicine

## 2011-12-02 DIAGNOSIS — E1165 Type 2 diabetes mellitus with hyperglycemia: Secondary | ICD-10-CM

## 2011-12-02 DIAGNOSIS — N390 Urinary tract infection, site not specified: Secondary | ICD-10-CM

## 2011-12-02 DIAGNOSIS — E782 Mixed hyperlipidemia: Secondary | ICD-10-CM

## 2011-12-02 DIAGNOSIS — D696 Thrombocytopenia, unspecified: Secondary | ICD-10-CM

## 2011-12-02 LAB — BASIC METABOLIC PANEL
BUN: 16 mg/dL (ref 6–23)
CO2: 20 mEq/L (ref 19–32)
Calcium: 8.6 mg/dL (ref 8.4–10.5)
Glucose, Bld: 134 mg/dL — ABNORMAL HIGH (ref 70–99)
Potassium: 4.3 mEq/L (ref 3.5–5.1)
Sodium: 132 mEq/L — ABNORMAL LOW (ref 135–145)

## 2011-12-02 LAB — CBC
HCT: 30.8 % — ABNORMAL LOW (ref 36.0–46.0)
Hemoglobin: 9.9 g/dL — ABNORMAL LOW (ref 12.0–15.0)
MCH: 27.7 pg (ref 26.0–34.0)
RBC: 3.57 MIL/uL — ABNORMAL LOW (ref 3.87–5.11)

## 2011-12-02 MED ORDER — LEVOFLOXACIN 750 MG PO TABS
750.0000 mg | ORAL_TABLET | Freq: Every day | ORAL | Status: DC
  Administered 2011-12-02 – 2011-12-03 (×2): 750 mg via ORAL
  Filled 2011-12-02 (×2): qty 1

## 2011-12-02 MED ORDER — FUROSEMIDE 40 MG PO TABS
40.0000 mg | ORAL_TABLET | Freq: Every day | ORAL | Status: DC
  Administered 2011-12-02 – 2011-12-03 (×2): 40 mg via ORAL
  Filled 2011-12-02 (×2): qty 1

## 2011-12-02 NOTE — Progress Notes (Signed)
   CARE MANAGEMENT NOTE 12/02/2011  Patient:  Jordan [name], Jordan [name]   Account Number:  [id number]  Date Initiated:  12/02/2011  Documentation initiated by:  Jiles Crocker  Subjective/Objective Assessment:   ADMITTED WITH UTI, RT LUNG INFILTRATE     Action/Plan:   PCP:   Kristian Covey, MD  Dr. Retta Diones Urologist    LIVES AT HOME WITH SPOUSE   Anticipated DC Date:  12/09/2011   Anticipated DC Plan:  HOME W HOME HEALTH SERVICES           Status of service:  In process, will continue to follow Medicare Important Message given?  NA - LOS <3 / Initial given by admissions (If response is "NO", the following Medicare IM given date fields will be blank)  Per UR Regulation:  Reviewed for med. necessity/level of care/duration of stay  Comments:  12/02/2011- B Byanka Landrus RN, BSN, MHA

## 2011-12-02 NOTE — Progress Notes (Addendum)
PROGRESS NOTE  Jordan [name] WUJ:[id number] DOB: Oct 01, 1945 DOA: 11/29/2011 PCP: Kristian Covey, MD  Brief narrative: Patient recently discharged 4/20 by urologist s/p percutaneous ureteroscopy with symptomatic 1 cm stone returned to the emergency room with possible sepsis of urinary origin. Urology was consulted given her complex renal history and CT urogram did not show any acute blockage. She was transitioned from IV therapy to by mouth Levaquin 6/18.  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 Berneice Heinrich, urology 6/16   Procedures:  None so far  UC done 6/15-pending  CT urogram 6/16 = right pyelonephritis +R inferior pole calculus  Antibiotics:  Levaquin 6/14  Vancomycin 6/14-6/17   Subjective  Doing well no nausea no vomiting.  Tolerating PO well Had some chills overnight No cp/sob/blurred or double vision States she has some LE swelling   Objective    Interim History: Reviewed old notes  Subjective:   Objective: Filed Vitals:   12/01/11 2205 12/02/11 0609 12/02/11 0748 12/02/11 1334  BP: 144/77 126/79 121/73 124/70  Pulse: 82 77 68 78  Temp: 99.2 F (37.3 C) 98.2 F (36.8 C) 98.1 F (36.7 C) 97.7 F (36.5 C)  TempSrc: Oral Oral Oral Oral  Resp: 20 20 20 20   Height:      Weight:  112.22 kg (247 lb 6.4 oz)    SpO2: 96% 97% 98% 99%    Intake/Output Summary (Last 24 hours) at 12/02/11 1654 Last data filed at 12/02/11 1500  Gross per 24 hour  Intake 2717.5 ml  Output   2000 ml  Net  717.5 ml    Exam:  General: Pleasant Caucasian female looking about stated age, morbidly  obese Cardiovascular: S1-S2 no murmur rub or gallop Respiratory: Clinically clear no added sound Abdomen: Soft nontender nondistended-ileostomy present, mild R CVA tenderness Skin no rash Neuro grossly intact  Data Reviewed: Basic Metabolic Panel:  Lab 12/02/11 9562 12/01/11 0550 11/30/11 0540 11/29/11 2035  NA 132* 133* 134* 136  K 4.3 4.2 -- --  CL 102 101 101 99  CO2 20 21 20 22   GLUCOSE 134* 145* 165* 170*  BUN 16 19 23  24*  CREATININE 0.94 0.97 1.07 1.09  CALCIUM 8.6 8.6 8.8 9.4  MG -- -- -- --  PHOS -- -- -- --   Liver Function Tests:  Lab 11/29/11 2035  AST 27  ALT 44*  ALKPHOS 84  BILITOT 0.6  PROT 7.1  ALBUMIN 3.2*   No results found for this basename: LIPASE:5,AMYLASE:5 in the last 168 hours No results found for this basename: AMMONIA:5 in the last 168 hours CBC:  Lab 12/02/11 0510 12/01/11 0550 11/30/11 0540 11/29/11 2035  WBC 8.5 11.4* 16.9* 15.1*  NEUTROABS -- 9.0* -- 13.3*  HGB 9.9* 9.9* 10.4* 13.2  HCT 30.8* 31.3* 32.2* 41.1  MCV 86.3 87.4 87.7 87.3  PLT 251 239 237 248   Cardiac Enzymes: No results found for this basename: CKTOTAL:5,CKMB:5,CKMBINDEX:5,TROPONINI:5 in the last 168 hours BNP: No components found with this basename: POCBNP:5 CBG:  Lab 12/02/11 1145 12/02/11 0737 12/01/11 2159 12/01/11 1701 12/01/11 1143  GLUCAP 140* 130* 137* 139* 165*    Recent Results (from the past 240 hour(s))  CULTURE, BLOOD (ROUTINE X 2)  Status: Normal (Preliminary result)   Collection Time   11/29/11  8:35 PM      Component Value Range Status Comment   Specimen Description BLOOD LEFT HAND   Final    Special Requests BOTTLES DRAWN AEROBIC AND ANAEROBIC 5CC   Final    Culture  Setup Time [id number]   Final    Culture     Final    Value:        BLOOD CULTURE RECEIVED NO GROWTH TO DATE CULTURE WILL BE HELD FOR 5 DAYS BEFORE ISSUING A FINAL NEGATIVE REPORT   Report Status PENDING   Incomplete   CULTURE, BLOOD (ROUTINE X 2)     Status: Normal  (Preliminary result)   Collection Time   11/29/11  9:00 PM      Component Value Range Status Comment   Specimen Description BLOOD LEFT ARM   Final    Special Requests BOTTLES DRAWN AEROBIC ONLY [facility]   Final    Culture  Setup Time [id number]   Final    Culture     Final    Value:        BLOOD CULTURE RECEIVED NO GROWTH TO DATE CULTURE WILL BE HELD FOR 5 DAYS BEFORE ISSUING A FINAL NEGATIVE REPORT   Report Status PENDING   Incomplete   URINE CULTURE     Status: Normal (Preliminary result)   Collection Time   11/29/11  9:38 PM      Component Value Range Status Comment   Specimen Description URINE, RANDOM   Final    Special Requests Normal   Final    Culture  Setup Time [id number]   Final    Colony Count 90,000 COLONIES/ML   Final    Culture PROTEUS MIRABILIS   Final    Report Status PENDING   Incomplete      Studies:              All Imaging reviewed and is as per above notation   Scheduled Meds:    . aspirin EC  81 mg Oral BH-q7a  . furosemide  40 mg Oral Daily  . heparin  5,000 Units Subcutaneous Q8H  . insulin aspart  0-15 Units Subcutaneous TID WC  . levofloxacin (LEVAQUIN) IV  750 mg Intravenous Q24H  . loratadine  10 mg Oral Daily  . losartan  100 mg Oral Q breakfast  . polyethylene glycol  17 g Oral Daily  . potassium chloride  30 mEq Oral BID  . simvastatin  40 mg Oral QPM  . sodium chloride  10-40 mL Intracatheter Q12H  . sodium chloride  3 mL Intravenous Q12H  . vancomycin  1,000 mg Intravenous Q12H   Continuous Infusions:    . DISCONTD: sodium chloride 75 mL/hr (12/02/11 0119)     Assessment/Plan: 1. Sepsis likely secondary to urinary origin-patient to continue Levaquin and vancomycin per pharmacy. Continue symptomatic treatment with Tylenol. I did consult Dr. Berneice Heinrich of urology--CT scan abdomen ordered per urology 6/16 requests showed right pyelonephritis with poor enhancement swelling and perinephric stranding right kidney without obstruction compatible with  ascending urinary tract infection and thickening of right ureter. There is a nonobstructing right inferior pole calculus.  UOP 6/17=6/18=1.8l.  Add back low dose lasix and reassess 2. Type 2 diabetes mellitus-CBGs ranging 130-165-continue sliding scale insulin moderate sensitivity-hold metformin till D/c.   3. Hyperlipidemia-continue Zocor 40 mg + omega-3 fatty acids 4. Stage I chronic kidney disease-baseline creatinine about 0.7 on 4/13-patient on losartan 100  mg, Lasix 80 mg twice daily-started back lasix 40mg -creatinine any worse would decrease ARB dosage 5. History cerebral palsy-stable 6. History peptic ulcer disease-continue PPI 7. Hypokalemia-resolved with replacement-continue Klor-Con 30 mEq twice a day 8. Likely dilutional anemia-hemoglobin dropped to 9.9 from 13.2. Repeat CBC in a.m. 9. Hypertension-slightly low blood pressures. Continue only losartan as needed.  Code Status: Full  Family Communication: none in room Disposition Plan: Possible discharge home one to 2 days   Pleas Koch, MD  [facility] Pager [phone] 12/02/2011, 4:54 PM    LOS: 3 days

## 2011-12-02 NOTE — Progress Notes (Signed)
Subjective: Patient reports that she still having some nausea. Her appetite is diminished.  Objective: Vital signs in last 24 hours: Temp:  [98.1 F (36.7 C)-100.4 F (38 C)] 98.1 F (36.7 C) (06/18 0748) Pulse Rate:  [68-86] 68  (06/18 0748) Resp:  [20] 20  (06/18 0748) BP: (121-144)/(64-79) 121/73 mmHg (06/18 0748) SpO2:  [96 %-98 %] 98 % (06/18 0748) Weight:  [112.22 kg (247 lb 6.4 oz)] 112.22 kg (247 lb 6.4 oz) (06/18 0609)  Intake/Output from previous day: 06/17 0701 - 06/18 0700 In: 2112.5 [P.O.:600; I.V.:862.5; IV Piggyback:650] Out: 1850 [Urine:1850] Intake/Output this shift:    Physical Exam:  Constitutional: Vital signs reviewed. WD WN in NAD     Lab Results:  Basename 12/02/11 0510 12/01/11 0550 11/30/11 0540  HGB 9.9* 9.9* 10.4*  HCT 30.8* 31.3* 32.2*   BMET  Basename 12/02/11 0510 12/01/11 0550  NA 132* 133*  K 4.3 4.2  CL 102 101  CO2 20 21  GLUCOSE 134* 145*  BUN 16 19  CREATININE 0.94 0.97  CALCIUM 8.6 8.6   No results found for this basename: LABPT:3,INR:3 in the last 72 hours No results found for this basename: LABURIN:1 in the last 72 hours Results for orders placed during the hospital encounter of 11/29/11  CULTURE, BLOOD (ROUTINE X 2)     Status: Normal (Preliminary result)   Collection Time   11/29/11  8:35 PM      Component Value Range Status Comment   Specimen Description BLOOD LEFT HAND   Final    Special Requests BOTTLES DRAWN AEROBIC AND ANAEROBIC 5CC   Final    Culture  Setup Time [id number]   Final    Culture     Final    Value:        BLOOD CULTURE RECEIVED NO GROWTH TO DATE CULTURE WILL BE HELD FOR 5 DAYS BEFORE ISSUING A FINAL NEGATIVE REPORT   Report Status PENDING   Incomplete   CULTURE, BLOOD (ROUTINE X 2)     Status: Normal (Preliminary result)   Collection Time   11/29/11  9:00 PM      Component Value Range Status Comment   Specimen Description BLOOD LEFT ARM   Final    Special Requests BOTTLES DRAWN AEROBIC ONLY  [facility]   Final    Culture  Setup Time [id number]   Final    Culture     Final    Value:        BLOOD CULTURE RECEIVED NO GROWTH TO DATE CULTURE WILL BE HELD FOR 5 DAYS BEFORE ISSUING A FINAL NEGATIVE REPORT   Report Status PENDING   Incomplete   URINE CULTURE     Status: Normal (Preliminary result)   Collection Time   11/29/11  9:38 PM      Component Value Range Status Comment   Specimen Description URINE, RANDOM   Final    Special Requests Normal   Final    Culture  Setup Time [id number]   Final    Colony Count PENDING   Incomplete    Culture Culture reincubated for better growth   Final    Report Status PENDING   Incomplete     Studies/Results: Ct Abdomen Pelvis W Wo Contrast  11/30/2011  *RADIOLOGY REPORT*  Clinical Data: Fevers.  Urinary tract infection.  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  Elevated white blood cell count of 16.9.  CT ABDOMEN AND PELVIS WITHOUT AND WITH CONTRAST  Technique:  Multidetector CT imaging  of the abdomen and pelvis was performed without contrast material in one or both body regions, followed by contrast material(s) and further sections in one or both body regions.  Contrast: OMNIPAQUE IOHEXOL 300 MG/ML  SOLN  Comparison: 09/25/2011.  Findings: Lung Bases: Dependent atelectasis at the lung bases. Mild elevation of the right hemidiaphragm.  Liver:  Normal.  Spleen:  Normal.  Gallbladder:  Cholecystectomy.  Common bile duct:  Normal.  Pancreas:  Normal.  Adrenal glands:  Normal.  Kidneys:  Punctate right inferior pole renal collecting system calculus.  Right perinephric stranding with patchy delayed enhancement of the right kidney relative to the left.  Findings suggest pyelonephritis.  Cyst is present in the left kidney.  The right ureter demonstrates thickening of the pelvis and proximal left ureter. The distal right ureter is collapsed.  There is no obstruction.  Ileal conduit is noted.  The left ureter opacifies normally, identifying the  location of the ileal conduit. Hysterectomy. Small parastomal hernia. Distal right ureteral stone seen on prior exam 09/25/2011 has either passed or been retrieved.  Stomach:  Normal.  Decompressed.  Small bowel:  Small bowel appears within normal limits.  Colon:   Prominent stool is present in the colon distally  Pelvic Genitourinary:  Elongated tubular cystic lesion is present in the left anatomic pelvis, probably representing lymphocele.  No aggressive osseous lesions.  Multilevel lumbar spondylosis.  Bones:  No aggressive osseous lesions are present.  Vasculature: Surgical clips posterior to the IVC in the abdomen.  IMPRESSION: 1.  Right pyelonephritis.  Poor enhancement, swelling and perinephric stranding of the right kidney without evidence of obstruction compatible with ascending urinary tract infection. 2.  Mural thickening of the proximal right ureter is also compatible with ascending urinary tract infection. 3.  Cystectomy with ileal conduit. 4.  Cholecystectomy and hysterectomy. 5.  Probable lymphocele in the left anatomic pelvis. 6.  Nonobstructing right inferior pole renal collecting system calculus. Passage or retrieval of previously seen distal right ureteral calculus.  Original Report Authenticated By: Andreas Newport, M.D.    Assessment/Plan:   Pyelonephritis, status post recent percutaneous/antegrade stone management. There is no evidence of hydronephrosis nor ureteral calculi. She is slowly improving. Her fever curve is improved. Urine culture is pending.    I spoke with the patient about the fact that, more than likely, it will take quite a few days to feel herself again. She states that she is not quite ready to go home yet.   LOS: 3 days   Jordan [name] M 12/02/2011, 7:50 AM

## 2011-12-02 NOTE — Progress Notes (Signed)
ANTIBIOTIC CONSULT NOTE - FOLLOW UP  Pharmacy Consult for Vanco Indication: Urosepsis  Allergies  Allergen Reactions  . Morphine Anaphylaxis  . Baclofen     GI upset  . Lisinopril Cough  . Metoclopramide Hcl     Reglan, irritable  . Penicillins     " MAJOR CASE OF HIVES"    Patient Measurements: Height: 5\' 9"  (175.3 cm) Weight: 247 lb 6.4 oz (112.22 kg) IBW/kg (Calculated) : 66.2   Vital Signs: Temp: 98.1 F (36.7 C) (06/18 0748) Temp src: Oral (06/18 0748) BP: 121/73 mmHg (06/18 0748) Pulse Rate: 68  (06/18 0748) Intake/Output from previous day: 06/17 0701 - 06/18 0700 In: 2112.5 [P.O.:600; I.V.:862.5; IV Piggyback:650] Out: 1850 [Urine:1850] Intake/Output from this shift: Total I/O In: 120 [P.O.:120] Out: 250 [Urine:250]  Labs:  [facility] 12/02/11 0510 12/01/11 0550 11/30/11 0540  WBC 8.5 11.4* 16.9*  HGB 9.9* 9.9* 10.4*  PLT 251 239 237  LABCREA -- -- --  CREATININE 0.94 0.97 1.07   Estimated Creatinine Clearance: 77.6 ml/min (by C-G formula based on Cr of 0.94).  Basename 12/02/11 0843  VANCOTROUGH 16.6  VANCOPEAK --  VANCORANDOM --  GENTTROUGH --  GENTPEAK --  GENTRANDOM --  TOBRATROUGH --  TOBRAPEAK --  TOBRARND --  AMIKACINPEAK --  AMIKACINTROU --  AMIKACIN --     Microbiology: Recent Results (from the past 720 hour(s))  CULTURE, BLOOD (ROUTINE X 2)     Status: Normal (Preliminary result)   Collection Time   11/29/11  8:35 PM      Component Value Range Status Comment   Specimen Description BLOOD LEFT HAND   Final    Special Requests BOTTLES DRAWN AEROBIC AND ANAEROBIC 5CC   Final    Culture  Setup Time [id number]   Final    Culture     Final    Value:        BLOOD CULTURE RECEIVED NO GROWTH TO DATE CULTURE WILL BE HELD FOR 5 DAYS BEFORE ISSUING A FINAL NEGATIVE REPORT   Report Status PENDING   Incomplete   CULTURE, BLOOD (ROUTINE X 2)     Status: Normal (Preliminary result)   Collection Time   11/29/11  9:00 PM      Component Value  Range Status Comment   Specimen Description BLOOD LEFT ARM   Final    Special Requests BOTTLES DRAWN AEROBIC ONLY [facility]   Final    Culture  Setup Time [id number]   Final    Culture     Final    Value:        BLOOD CULTURE RECEIVED NO GROWTH TO DATE CULTURE WILL BE HELD FOR 5 DAYS BEFORE ISSUING A FINAL NEGATIVE REPORT   Report Status PENDING   Incomplete   URINE CULTURE     Status: Normal (Preliminary result)   Collection Time   11/29/11  9:38 PM      Component Value Range Status Comment   Specimen Description URINE, RANDOM   Final    Special Requests Normal   Final    Culture  Setup Time [id number]   Final    Colony Count 90,000 COLONIES/ML   Final    Culture PROTEUS MIRABILIS   Final    Report Status PENDING   Incomplete     Anti-infectives     Start     Dose/Rate Route Frequency Ordered Stop   11/30/11 1000   vancomycin (VANCOCIN) IVPB 1000 mg/200 mL premix  1,000 mg 200 mL/hr over 60 Minutes Intravenous Every 12 hours 11/30/11 0547     11/30/11 0100   vancomycin (VANCOCIN) IVPB 1000 mg/200 mL premix  Status:  Discontinued        1,000 mg 200 mL/hr over 60 Minutes Intravenous  Once 11/30/11 0049 11/30/11 0050   11/30/11 0100   vancomycin (VANCOCIN) 1,500 mg in sodium chloride 0.9 % 500 mL IVPB        1,500 mg 250 mL/hr over 120 Minutes Intravenous  Once 11/30/11 0050 11/30/11 0334   11/29/11 2130   levofloxacin (LEVAQUIN) IVPB 750 mg        750 mg 100 mL/hr over 90 Minutes Intravenous Every 24 hours 11/29/11 2117            Assessment: 66 yo F on Day #3 Vanco and Day #4 Levaquin for sepsis due to pyelonephritis. Urine Cx now growing proteus (sensitivities pending). Renal function stable. Vanco trough came back at 16.6, in goal range for sepsis indication, just above goal range for pyelo indication. Given that renal function is stable, will leave Vanco dose as-is, to cover both indications. Hopefully can de-escalate antibiotics soon.  Goal of Therapy:    Vancomycin trough level 15-20 mcg/ml (sepsis) Vanco Trough 10-15 for pyelonephritis  Plan:  1) Continue current Vanco dose 2) No changes to Levaquin dose 3) Watch Urine Cx 4) Hopefully can de-escalate abx soon.  Darrol Angel, PharmD Pager: [phone] 12/02/2011,10:16 AM

## 2011-12-03 DIAGNOSIS — N390 Urinary tract infection, site not specified: Secondary | ICD-10-CM

## 2011-12-03 DIAGNOSIS — E785 Hyperlipidemia, unspecified: Secondary | ICD-10-CM

## 2011-12-03 DIAGNOSIS — A419 Sepsis, unspecified organism: Secondary | ICD-10-CM | POA: Diagnosis present

## 2011-12-03 DIAGNOSIS — E1165 Type 2 diabetes mellitus with hyperglycemia: Secondary | ICD-10-CM

## 2011-12-03 DIAGNOSIS — I10 Essential (primary) hypertension: Secondary | ICD-10-CM

## 2011-12-03 LAB — BASIC METABOLIC PANEL
BUN: 16 mg/dL (ref 6–23)
CO2: 20 mEq/L (ref 19–32)
Chloride: 102 mEq/L (ref 96–112)
Glucose, Bld: 127 mg/dL — ABNORMAL HIGH (ref 70–99)
Potassium: 4.3 mEq/L (ref 3.5–5.1)

## 2011-12-03 LAB — CBC
HCT: 31.7 % — ABNORMAL LOW (ref 36.0–46.0)
Hemoglobin: 10.2 g/dL — ABNORMAL LOW (ref 12.0–15.0)
MCH: 27.7 pg (ref 26.0–34.0)
MCHC: 32.2 g/dL (ref 30.0–36.0)
MCV: 86.1 fL (ref 78.0–100.0)
RBC: 3.68 MIL/uL — ABNORMAL LOW (ref 3.87–5.11)

## 2011-12-03 LAB — GLUCOSE, CAPILLARY: Glucose-Capillary: 116 mg/dL — ABNORMAL HIGH (ref 70–99)

## 2011-12-03 LAB — URINE CULTURE

## 2011-12-03 MED ORDER — LEVOFLOXACIN 750 MG PO TABS: 750.0000 mg | ORAL_TABLET | Freq: Every day | ORAL | Status: AC

## 2011-12-03 NOTE — Progress Notes (Signed)
Subjective: Patient reports that she is feeling much better.  Objective: Vital signs in last 24 hours: Temp:  [97.7 F (36.5 C)-98.1 F (36.7 C)] 98.1 F (36.7 C) (06/19 0430) Pulse Rate:  [69-78] 73  (06/19 0430) Resp:  [20-22] 20  (06/19 0430) BP: (117-124)/(70-75) 121/75 mmHg (06/19 0430) SpO2:  [98 %-99 %] 98 % (06/19 0430)  Intake/Output from previous day: 06/18 0701 - 06/19 0700 In: 1085 [P.O.:360; I.V.:525; IV Piggyback:200] Out: 3750 [Urine:3750] Intake/Output this shift:    Physical Exam:  Constitutional: Vital signs reviewed. WD WN in NAD     Lab Results:  Basename 12/03/11 0451 12/02/11 0510 12/01/11 0550  HGB 10.2* 9.9* 9.9*  HCT 31.7* 30.8* 31.3*   BMET  Basename 12/03/11 0451 12/02/11 0510  NA 133* 132*  K 4.3 4.3  CL 102 102  CO2 20 20  GLUCOSE 127* 134*  BUN 16 16  CREATININE 0.82 0.94  CALCIUM 8.9 8.6   No results found for this basename: LABPT:3,INR:3 in the last 72 hours No results found for this basename: LABURIN:1 in the last 72 hours Results for orders placed during the hospital encounter of 11/29/11  CULTURE, BLOOD (ROUTINE X 2)     Status: Normal (Preliminary result)   Collection Time   11/29/11  8:35 PM      Component Value Range Status Comment   Specimen Description BLOOD LEFT HAND   Final    Special Requests BOTTLES DRAWN AEROBIC AND ANAEROBIC 5CC   Final    Culture  Setup Time [id number]   Final    Culture     Final    Value:        BLOOD CULTURE RECEIVED NO GROWTH TO DATE CULTURE WILL BE HELD FOR 5 DAYS BEFORE ISSUING A FINAL NEGATIVE REPORT   Report Status PENDING   Incomplete   CULTURE, BLOOD (ROUTINE X 2)     Status: Normal (Preliminary result)   Collection Time   11/29/11  9:00 PM      Component Value Range Status Comment   Specimen Description BLOOD LEFT ARM   Final    Special Requests BOTTLES DRAWN AEROBIC ONLY [facility]   Final    Culture  Setup Time [id number]   Final    Culture     Final    Value:        BLOOD  CULTURE RECEIVED NO GROWTH TO DATE CULTURE WILL BE HELD FOR 5 DAYS BEFORE ISSUING A FINAL NEGATIVE REPORT   Report Status PENDING   Incomplete   URINE CULTURE     Status: Normal   Collection Time   11/29/11  9:38 PM      Component Value Range Status Comment   Specimen Description URINE, RANDOM   Final    Special Requests Normal   Final    Culture  Setup Time [id number]   Final    Colony Count 90,000 COLONIES/ML   Final    Culture PROTEUS MIRABILIS   Final    Report Status 12/03/2011 FINAL   Final    Organism ID, Bacteria PROTEUS MIRABILIS   Final     Studies/Results: No results found.  Assessment/Plan:   Pyelonephritis, secondary to Proteus mirabilis. This is basically a pansensitive organism except for nitrofurantoin. She is covered adequately with Levaquin. I would feel comfortable with discharge at any point, as she is taking oral medications well, the bacteria is identified and she is on proper antibiotic management. She should keep her regular followup with me.  LOS: 4 days   Marcine Matar M 12/03/2011, 8:50 AM
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
## 2011-12-03 NOTE — Discharge Instructions (Signed)

## 2011-12-05 ENCOUNTER — Encounter: Payer: Self-pay | Admitting: Family Medicine

## 2011-12-05 ENCOUNTER — Ambulatory Visit (INDEPENDENT_AMBULATORY_CARE_PROVIDER_SITE_OTHER): Payer: Medicare Other | Admitting: Family Medicine

## 2011-12-05 VITALS — BP 110/62 | Temp 97.7°F | Wt 186.0 lb

## 2011-12-05 DIAGNOSIS — E119 Type 2 diabetes mellitus without complications: Secondary | ICD-10-CM

## 2011-12-05 DIAGNOSIS — N12 Tubulo-interstitial nephritis, not specified as acute or chronic: Secondary | ICD-10-CM

## 2011-12-05 MED ORDER — BENZONATATE 200 MG PO CAPS: 200.0000 mg | ORAL_CAPSULE | Freq: Three times a day (TID) | ORAL | Status: AC | PRN

## 2011-12-05 NOTE — Progress Notes (Signed)
Subjective:    Patient ID: Jordan [name], female    DOB: [birth year], 66 y.o.   MRN: [id number]  HPI  Hospital followup. Patient has history of kidney stones and prior history of total cystectomy. Last Friday she developed fever to 103 with nausea and vomiting. She was seen in Emergency department and diagnosed with pyelonephritis. Elevated white blood cell count 15,000. Chest x-ray showed atelectasis versus right base infiltrate. Patient did not have any significant shortness of breath. Urinalysis suggestive of infection. Patient was cultured and placed on antibiotics with vancomycin and Levaquin. Received PICC line in right arm because of difficulty of access. Promptly improved. Discharged on 8 more days of Levaquin. No recurrent fever. Good appetite. Good fluid intake.  Type 2 diabetes. A1c on admission 6.1%. Somewhat labile blood sugars since her admission but overall this is improved over recent weight loss efforts.  Past Medical History  Diagnosis Date  . HYPERLIPIDEMIA 11/29/2009  . HYPERTENSION 11/29/2009  . COLONIC POLYPS 07/11/2010  . CEREBRAL PALSY 07/11/2010    PT CAN TRANSFER BED TO CHAIR--CAN STAND BRIEFLY--BUT NOT ABLE TO WALK; RT LEG HYPEREXTENDS BACKWARD AND IS WEAK-HAS A POWER CHAIR  . Diabetes mellitus     ORAL MED FOR DIABETES  . Blood transfusion 1992    AFTER  SURGERY TO REMOVE BLADDER  . GERD (gastroesophageal reflux disease)     PAST HISTORY GERD--WATCHES DIET - NOT ON ANY MEDS FOR GERD  . Incisional hernia     NO PAIN-NOT CAUSING ANY DISCOMFORT  . Arthritis     OA  . PONV (postoperative nausea and vomiting)    Past Surgical History  Procedure Date  . Appendectomy   . Cholecystectomy   . Tonsillectomy   . Revision urostomy cutaneous 1992  . Abdominal hysterectomy 1990    partial  . Radical cystectomy 1992    neurogenic bladder sec CP  . Rt ankle fusion 1975    SEVERAL RT ANKLE SURGERIES PRIOR TO THE FUSION  . Left carpal tunnel release x 2   . Right carpal  tunnel release   . Lumbar sympathetectomies x 2   . 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 Nephrolithotomy 10/01/2011    Procedure: NEPHROLITHOTOMY PERCUTANEOUS;  Surgeon: Marcine Matar, MD;  Location: [facility];  Service: Urology;  Laterality: Right;         reports that she has never smoked. She has never used smokeless tobacco. She reports that she does not drink alcohol or use illicit drugs. family history includes Cancer in her father; Emphysema in her father; Heart disease in her father; and Heart disease (age of onset:70) in her mother. Allergies  Allergen Reactions  . Morphine Anaphylaxis  . Baclofen     GI upset  . Lisinopril Cough  . Metoclopramide Hcl     Reglan, irritable  . Penicillins     " MAJOR CASE OF HIVES"      Review of Systems  Constitutional: Negative for fever, chills and fatigue.  Respiratory: Negative for cough and shortness of breath.   Gastrointestinal: Negative for nausea, vomiting and abdominal pain.  Neurological: Negative for dizziness and syncope.  Hematological: Negative for adenopathy.       Objective:   Physical Exam  Constitutional: She is oriented to person, place, and time. She appears well-developed and well-nourished. No distress.  Neck: Neck supple. No thyromegaly present.  Cardiovascular: Normal rate and regular rhythm.   Pulmonary/Chest: Effort normal and breath sounds normal. No respiratory distress.  She has no wheezes. She has no rales.  Musculoskeletal: She exhibits no edema.  Lymphadenopathy:    She has no cervical adenopathy.  Neurological: She is alert and oriented to person, place, and time.          Assessment & Plan:  #1 recent right polynephritis with Proteus. Clinically improved. Finish out Levaquin. She has followup scheduled already with urologist little over week from now #2 type 2 diabetes. History of good control by recent A1c. Routine followup in 4 months to reassess

## 2011-12-06 LAB — CULTURE, BLOOD (ROUTINE X 2)
Culture  Setup Time: 201306160317
Culture: NO GROWTH

## 2011-12-10 ENCOUNTER — Telehealth: Payer: Self-pay | Admitting: Family Medicine

## 2011-12-10 NOTE — Telephone Encounter (Signed)
Caller: Jordan [name]/Patient; PCP: Evelena Peat; CB#: [phone]; ; ; Call regarding Nausea When Taking Levaquin; Onset 12/10/11 noon, took Levaquin at lunch instead of this morning, coughed and then vomited per caller. Has felt "queasy" for 1.5 hrs after taking 12/09/11 and today. Afebrile and no nausea at present. Wanting to know if she can take anything for the nausea. All emergent s/s r/o per Diabetes: Gastrointestinal Problems. RN advised caller to take Levaqin in AM, check Rx instructions for taking with or without food. Home care advice given.

## 2011-12-11 ENCOUNTER — Other Ambulatory Visit: Payer: Self-pay | Admitting: Family Medicine

## 2012-03-02 ENCOUNTER — Ambulatory Visit (INDEPENDENT_AMBULATORY_CARE_PROVIDER_SITE_OTHER): Payer: Medicare Other | Admitting: Family Medicine

## 2012-03-02 ENCOUNTER — Encounter: Payer: Self-pay | Admitting: Family Medicine

## 2012-03-02 VITALS — BP 144/78 | Temp 97.8°F

## 2012-03-02 DIAGNOSIS — H8110 Benign paroxysmal vertigo, unspecified ear: Secondary | ICD-10-CM

## 2012-03-02 MED ORDER — ONDANSETRON 8 MG PO TBDP: 8.0000 mg | ORAL_TABLET | Freq: Three times a day (TID) | ORAL | Status: DC | PRN

## 2012-03-02 NOTE — Patient Instructions (Addendum)

## 2012-03-02 NOTE — Progress Notes (Signed)
Subjective:    Patient ID: Jordan [name], female    DOB: June 30, 1945, 66 y.o.   MRN: [id number]  HPI  Intermittent vertigo symptoms past 2 weeks. Hearing aids but she does not think is related. She's had extensive workup previously for hearing loss with ENT specialist. Head MRI scan back in May which did not show any acute abnormalities. No earache. She has had intermittent nausea with episodes of vertigo. Couple episodes of vomiting. No headaches. No ataxia. No focal weakness. No visual changes. No swallowing difficulties and no speech difficulties. Diabetes has been stable.  Past Medical History  Diagnosis Date  . HYPERLIPIDEMIA 11/29/2009  . HYPERTENSION 11/29/2009  . COLONIC POLYPS 07/11/2010  . CEREBRAL PALSY 07/11/2010    PT CAN TRANSFER BED TO CHAIR--CAN STAND BRIEFLY--BUT NOT ABLE TO WALK; RT LEG HYPEREXTENDS BACKWARD AND IS WEAK-HAS A POWER CHAIR  . Diabetes mellitus     ORAL MED FOR DIABETES  . Blood transfusion 1992    AFTER  SURGERY TO REMOVE BLADDER  . GERD (gastroesophageal reflux disease)     PAST HISTORY GERD--WATCHES DIET - NOT ON ANY MEDS FOR GERD  . Incisional hernia     NO PAIN-NOT CAUSING ANY DISCOMFORT  . Arthritis     OA  . PONV (postoperative nausea and vomiting)    Past Surgical History  Procedure Date  . Appendectomy   . Cholecystectomy   . Tonsillectomy   . Revision urostomy cutaneous 1992  . Abdominal hysterectomy 1990    partial  . Radical cystectomy 1992    neurogenic bladder sec CP  . Rt ankle fusion 1975    SEVERAL RT ANKLE SURGERIES PRIOR TO THE FUSION  . Left carpal tunnel release x 2   . Right carpal tunnel release   . Lumbar sympathetectomies x 2   . 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 Nephrolithotomy 10/01/2011    Procedure: NEPHROLITHOTOMY PERCUTANEOUS;  Surgeon: Marcine Matar, MD;  Location: [facility];  Service: Urology;  Laterality: Right;         reports that she has never smoked. She has never used smokeless  tobacco. She reports that she does not drink alcohol or use illicit drugs. family history includes Cancer in her father; Emphysema in her father; Heart disease in her father; and Heart disease (age of onset:70) in her mother. Allergies  Allergen Reactions  . Morphine Anaphylaxis  . Baclofen     GI upset  . Lisinopril Cough  . Metoclopramide Hcl     Reglan, irritable  . Penicillins     " MAJOR CASE OF HIVES"      Review of Systems  Constitutional: Negative for fever and chills.  HENT: Positive for hearing loss.   Eyes: Negative for visual disturbance.  Gastrointestinal: Positive for nausea and vomiting.  Genitourinary: Negative for dysuria.  Neurological: Positive for dizziness. Negative for weakness, numbness and headaches.       Objective:   Physical Exam  Constitutional: She is oriented to person, place, and time. She appears well-developed and well-nourished.  HENT:  Right Ear: External ear normal.  Left Ear: External ear normal.  Neck: Neck supple.       No carotid bruits  Cardiovascular: Normal rate and regular rhythm.   Pulmonary/Chest: Effort normal and breath sounds normal. No respiratory distress. She has no wheezes.  Neurological: She is alert and oriented to person, place, and time. No cranial nerve deficit.  Assessment & Plan:  Vertigo. Suspect benign positional vertigo. We explained this is usually self-limited. Zofran 8 mg every 8 hours as needed for dizziness. Consider physical therapy for vestibular rehabilitation if symptoms persist

## 2012-03-25 LAB — HM DIABETES EYE EXAM

## 2012-04-07 ENCOUNTER — Other Ambulatory Visit: Payer: Self-pay | Admitting: Family Medicine

## 2012-04-08 ENCOUNTER — Encounter: Payer: Self-pay | Admitting: Family Medicine

## 2012-05-21 ENCOUNTER — Other Ambulatory Visit: Payer: Self-pay | Admitting: *Deleted

## 2012-05-21 MED ORDER — ACCU-CHEK SOFT TOUCH LANCETS MISC: Status: AC

## 2012-06-01 ENCOUNTER — Ambulatory Visit (INDEPENDENT_AMBULATORY_CARE_PROVIDER_SITE_OTHER): Payer: Medicare Other | Admitting: Family Medicine

## 2012-06-01 ENCOUNTER — Encounter: Payer: Self-pay | Admitting: Family Medicine

## 2012-06-01 VITALS — BP 130/70 | Temp 97.5°F | Wt 180.0 lb

## 2012-06-01 DIAGNOSIS — E785 Hyperlipidemia, unspecified: Secondary | ICD-10-CM

## 2012-06-01 DIAGNOSIS — Z23 Encounter for immunization: Secondary | ICD-10-CM

## 2012-06-01 DIAGNOSIS — E119 Type 2 diabetes mellitus without complications: Secondary | ICD-10-CM

## 2012-06-01 DIAGNOSIS — I10 Essential (primary) hypertension: Secondary | ICD-10-CM

## 2012-06-01 LAB — BASIC METABOLIC PANEL
Calcium: 9.4 mg/dL (ref 8.4–10.5)
GFR: 70.69 mL/min (ref >60.00)
Potassium: 3.6 mEq/L (ref 3.5–5.1)
Sodium: 138 mEq/L (ref 135–145)

## 2012-06-01 LAB — HEPATIC FUNCTION PANEL
ALT: 28 U/L (ref 0–35)
AST: 18 U/L (ref 0–37)
Bilirubin, Direct: 0 mg/dL (ref 0.0–0.3)
Total Bilirubin: 0.6 mg/dL (ref 0.3–1.2)
Total Protein: 7.6 g/dL (ref 6.0–8.3)

## 2012-06-01 LAB — LIPID PANEL
Cholesterol: 138 mg/dL (ref 0–200)
HDL: 41 mg/dL (ref >39.00)
VLDL: 28.4 mg/dL (ref 0.0–40.0)

## 2012-06-01 MED ORDER — AZITHROMYCIN 250 MG PO TABS: ORAL_TABLET | ORAL | Status: AC

## 2012-06-01 MED ORDER — BENZONATATE 200 MG PO CAPS: 200.0000 mg | ORAL_CAPSULE | Freq: Two times a day (BID) | ORAL | Status: DC | PRN

## 2012-06-01 NOTE — Progress Notes (Signed)
Subjective:    Patient ID: Jordan [name], female    DOB: 08-16-1945, 66 y.o.   MRN: [id number]  HPI  Routine medical followup. History of type 2 diabetes, hypertension, hyperlipidemia, history of cerebral palsy and history of total cystectomy. She has history of chronic peripheral edema which has been controlled with compression stockings and furosemide. Medications reviewed. Diabetes stable fasting blood sugars 118 -130. Pre-supper glucose usually 100. Recent A1c 6 months ago 6.1%. No symptoms of hyperglycemia.  She's had cough for one month. Occasionally productive of yellow sputum. Yellow discolored postnasal drainage. Intermittent headaches. Frontal sinus pressure. No wheezing. Still needs flu vaccine.  Past Medical History  Diagnosis Date  . HYPERLIPIDEMIA 11/29/2009  . HYPERTENSION 11/29/2009  . COLONIC POLYPS 07/11/2010  . CEREBRAL PALSY 07/11/2010    PT CAN TRANSFER BED TO CHAIR--CAN STAND BRIEFLY--BUT NOT ABLE TO WALK; RT LEG HYPEREXTENDS BACKWARD AND IS WEAK-HAS A POWER CHAIR  . Diabetes mellitus     ORAL MED FOR DIABETES  . Blood transfusion 1992    AFTER  SURGERY TO REMOVE BLADDER  . GERD (gastroesophageal reflux disease)     PAST HISTORY GERD--WATCHES DIET - NOT ON ANY MEDS FOR GERD  . Incisional hernia     NO PAIN-NOT CAUSING ANY DISCOMFORT  . Arthritis     OA  . PONV (postoperative nausea and vomiting)    Past Surgical History  Procedure Date  . Appendectomy   . Cholecystectomy   . Tonsillectomy   . Revision urostomy cutaneous 1992  . Abdominal hysterectomy 1990    partial  . Radical cystectomy 1992    neurogenic bladder sec CP  . Rt ankle fusion 1975    SEVERAL RT ANKLE SURGERIES PRIOR TO THE FUSION  . Left carpal tunnel release x 2   . Right carpal tunnel release   . Lumbar sympathetectomies x 2   . 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 Nephrolithotomy 10/01/2011    Procedure: NEPHROLITHOTOMY PERCUTANEOUS;  Surgeon: Marcine Matar, MD;   Location: [facility];  Service: Urology;  Laterality: Right;         reports that she has never smoked. She has never used smokeless tobacco. She reports that she does not drink alcohol or use illicit drugs. family history includes Cancer in her father; Emphysema in her father; Heart disease in her father; and Heart disease (age of onset:70) in her mother. Allergies  Allergen Reactions  . Morphine Anaphylaxis  . Baclofen     GI upset  . Lisinopril Cough  . Metoclopramide Hcl     Reglan, irritable  . Penicillins     " MAJOR CASE OF HIVES"      Review of Systems  Constitutional: Negative for fatigue.  Eyes: Negative for visual disturbance.  Respiratory: Negative for cough, chest tightness, shortness of breath and wheezing.   Cardiovascular: Negative for chest pain, palpitations and leg swelling.  Neurological: Negative for dizziness, seizures, syncope, weakness, light-headedness and headaches.       Objective:   Physical Exam  Constitutional: She appears well-developed and well-nourished.  HENT:  Right Ear: External ear normal.  Left Ear: External ear normal.  Mouth/Throat: Oropharynx is clear and moist.  Cardiovascular: Normal rate and regular rhythm.   Pulmonary/Chest: Effort normal and breath sounds normal. No respiratory distress. She has no wheezes. She has no rales.  Musculoskeletal:        compression garments on. Only trace edema legs bilaterally  Assessment & Plan:  #1 type 2 diabetes. History of excellent control. Recheck A1c. Continue yearly eye exams #2 hyperlipidemia. Check lipid and hepatic panel. This has not been checked in over one year.  #3 chronic diuretic use with furosemide. Check basic metabolic panel #4 probable acute sinusitis. Given duration of symptoms, start Zithromax. She is penicillin allergic.

## 2012-06-03 ENCOUNTER — Ambulatory Visit (INDEPENDENT_AMBULATORY_CARE_PROVIDER_SITE_OTHER): Payer: Medicare Other | Admitting: Family Medicine

## 2012-06-03 ENCOUNTER — Encounter: Payer: Self-pay | Admitting: Family Medicine

## 2012-06-03 VITALS — BP 120/70 | Temp 98.3°F

## 2012-06-03 DIAGNOSIS — L02419 Cutaneous abscess of limb, unspecified: Secondary | ICD-10-CM

## 2012-06-03 DIAGNOSIS — L03119 Cellulitis of unspecified part of limb: Secondary | ICD-10-CM

## 2012-06-03 MED ORDER — CEPHALEXIN 500 MG PO CAPS: 500.0000 mg | ORAL_CAPSULE | Freq: Three times a day (TID) | ORAL | Status: DC

## 2012-06-03 NOTE — Progress Notes (Signed)
  Subjective:    Patient ID: See Beharry, female    DOB: [birth year], 65 y.o.   MRN: [id number]  HPI  Left leg redness and slight pain with onset yesterday. Location is posterior leg. No injury. She has some chronic dryness and scaling of her lower legs is using moisturizer lotion. No fever. No chills. She has some chronic lower extremity edema.  She has reported history of penicillin allergy but she thinks is taking Keflex previously. Also, she's never had any, anaphylactic allergic reaction to penicillin  Past Medical History  Diagnosis Date  . HYPERLIPIDEMIA 11/29/2009  . HYPERTENSION 11/29/2009  . COLONIC POLYPS 07/11/2010  . CEREBRAL PALSY 07/11/2010    PT CAN TRANSFER BED TO CHAIR--CAN STAND BRIEFLY--BUT NOT ABLE TO WALK; RT LEG HYPEREXTENDS BACKWARD AND IS WEAK-HAS A POWER CHAIR  . Diabetes mellitus     ORAL MED FOR DIABETES  . Blood transfusion 1992    AFTER  SURGERY TO REMOVE BLADDER  . GERD (gastroesophageal reflux disease)     PAST HISTORY GERD--WATCHES DIET - NOT ON ANY MEDS FOR GERD  . Incisional hernia     NO PAIN-NOT CAUSING ANY DISCOMFORT  . Arthritis     OA  . PONV (postoperative nausea and vomiting)    Past Surgical History  Procedure Date  . Appendectomy   . Cholecystectomy   . Tonsillectomy   . Revision urostomy cutaneous 1992  . Abdominal hysterectomy 1990    partial  . Radical cystectomy 1992    neurogenic bladder sec CP  . Rt ankle fusion 1975    SEVERAL RT ANKLE SURGERIES PRIOR TO THE FUSION  . Left carpal tunnel release x 2   . Right carpal tunnel release   . Lumbar sympathetectomies x 2   . 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 Nephrolithotomy 10/01/2011    Procedure: NEPHROLITHOTOMY PERCUTANEOUS;  Surgeon: Marcine Matar, MD;  Location: [facility];  Service: Urology;  Laterality: Right;         reports that she has never smoked. She has never used smokeless tobacco. She reports that she does not drink alcohol or use illicit  drugs. family history includes Cancer in her father; Emphysema in her father; Heart disease in her father; and Heart disease (age of onset:70) in her mother. Allergies  Allergen Reactions  . Morphine Anaphylaxis  . Baclofen     GI upset  . Lisinopril Cough  . Metoclopramide Hcl     Reglan, irritable  . Penicillins     " MAJOR CASE OF HIVES"      Review of Systems  Constitutional: Negative for fever, chills and fatigue.  Respiratory: Negative for shortness of breath.        Objective:   Physical Exam  Constitutional: She appears well-developed and well-nourished.  Cardiovascular: Normal rate and regular rhythm.   Pulmonary/Chest: Effort normal and breath sounds normal. No respiratory distress. She has no wheezes. She has no rales.  Skin:       Left leg reveals some erythema posterior calf region. Area about 10 x 10 cm. Minimal warmth. Slightly tender to palpation. She's not having significant edema involving the lower leg or foot          Assessment & Plan:  Probable early cellulitis left leg. Elevation frequently. Keflex 500 mg 3 times a day for 10 days. Followup promptly for fever or worsening symptoms

## 2012-06-03 NOTE — Patient Instructions (Addendum)
Cellulitis Cellulitis is an infection of the skin and the tissue beneath it. The infected area is usually red and tender. Cellulitis occurs most often in the arms and lower legs.   CAUSES   Cellulitis is caused by bacteria that enter the skin through cracks or cuts in the skin. The most common types of bacteria that cause cellulitis are Staphylococcus and Streptococcus. SYMPTOMS    Redness and warmth.   Swelling.   Tenderness or pain.   Fever.  DIAGNOSIS  Your caregiver can usually determine what is wrong based on a physical exam. Blood tests may also be done. TREATMENT   Treatment usually involves taking an antibiotic medicine. HOME CARE INSTRUCTIONS    Take your antibiotics as directed. Finish them even if you start to feel better.   Keep the infected arm or leg elevated to reduce swelling.   Apply a warm cloth to the affected area up to 4 times per day to relieve pain.   Only take over-the-counter or prescription medicines for pain, discomfort, or fever as directed by your caregiver.   Keep all follow-up appointments as directed by your caregiver.  SEEK MEDICAL CARE IF:    You notice red streaks coming from the infected area.   Your red area gets larger or turns dark in color.   Your bone or joint underneath the infected area becomes painful after the skin has healed.   Your infection returns in the same area or another area.   You notice a swollen bump in the infected area.   You develop new symptoms.  SEEK IMMEDIATE MEDICAL CARE IF:    You have a fever.   You feel very sleepy.   You develop vomiting or diarrhea.   You have a general ill feeling (malaise) with muscle aches and pains.  MAKE SURE YOU:    Understand these instructions.   Will watch your condition.   Will get help right away if you are not doing well or get worse.  Document Released: 03/12/2005 Document Revised: 12/02/2011 Document Reviewed: 08/18/2011 [employer] Patient Information 2013  [employer]    

## 2012-06-04 NOTE — Progress Notes (Signed)
Quick Note:  Pt informed on personally identified VM ______ 

## 2012-07-01 ENCOUNTER — Other Ambulatory Visit: Payer: Self-pay | Admitting: Family Medicine

## 2012-10-04 ENCOUNTER — Other Ambulatory Visit: Payer: Self-pay | Admitting: Family Medicine

## 2012-11-01 ENCOUNTER — Other Ambulatory Visit: Payer: Self-pay | Admitting: Family Medicine

## 2012-11-19 ENCOUNTER — Other Ambulatory Visit: Payer: Self-pay | Admitting: Family Medicine

## 2012-11-30 ENCOUNTER — Ambulatory Visit: Payer: Medicare Other | Admitting: Family Medicine

## 2012-12-03 ENCOUNTER — Encounter: Payer: Self-pay | Admitting: Family Medicine

## 2012-12-03 ENCOUNTER — Ambulatory Visit (INDEPENDENT_AMBULATORY_CARE_PROVIDER_SITE_OTHER): Payer: Medicare Other | Admitting: Family Medicine

## 2012-12-03 VITALS — BP 140/60 | Temp 98.6°F

## 2012-12-03 DIAGNOSIS — I10 Essential (primary) hypertension: Secondary | ICD-10-CM

## 2012-12-03 DIAGNOSIS — E119 Type 2 diabetes mellitus without complications: Secondary | ICD-10-CM

## 2012-12-03 DIAGNOSIS — E785 Hyperlipidemia, unspecified: Secondary | ICD-10-CM

## 2012-12-03 LAB — HEMOGLOBIN A1C: Hgb A1c MFr Bld: 6.6 % — ABNORMAL HIGH (ref 4.6–6.5)

## 2012-12-03 NOTE — Progress Notes (Signed)
Subjective:    Patient ID: Jordan [name], female    DOB: 06-29-1945, 67 y.o.   MRN: [id number]  HPI Medical followup. Patient history cerebral palsy, obesity, type 2 diabetes, hypertension, hyperlipidemia, history of total cystectomy. Diabetes been well controlled. Fasting blood sugars usually run 130. She remains on metformin No symptoms of hyperglycemia. Last eye exam was in the spring. No retinopathy noted.  Lipids were checked last fall and stable. Hypertension treated with losartan. Blood pressure stable by home readings. No recent dizziness.  Past Medical History  Diagnosis Date  . HYPERLIPIDEMIA 11/29/2009  . HYPERTENSION 11/29/2009  . COLONIC POLYPS 07/11/2010  . CEREBRAL PALSY 07/11/2010    PT CAN TRANSFER BED TO CHAIR--CAN STAND BRIEFLY--BUT NOT ABLE TO WALK; RT LEG HYPEREXTENDS BACKWARD AND IS WEAK-HAS A POWER CHAIR  . Diabetes mellitus     ORAL MED FOR DIABETES  . Blood transfusion 1992    AFTER  SURGERY TO REMOVE BLADDER  . GERD (gastroesophageal reflux disease)     PAST HISTORY GERD--WATCHES DIET - NOT ON ANY MEDS FOR GERD  . Incisional hernia     NO PAIN-NOT CAUSING ANY DISCOMFORT  . Arthritis     OA  . PONV (postoperative nausea and vomiting)    Past Surgical History  Procedure Laterality Date  . Appendectomy    . Cholecystectomy    . Tonsillectomy    . Revision urostomy cutaneous  1992  . Abdominal hysterectomy  1990    partial  . Radical cystectomy  1992    neurogenic bladder sec CP  . Rt ankle fusion  1975    SEVERAL RT ANKLE SURGERIES PRIOR TO THE FUSION  . Left carpal tunnel release x 2    . Right carpal tunnel release    . Lumbar sympathetectomies x 2    . 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 Nephrolithotomy  10/01/2011    Procedure: NEPHROLITHOTOMY PERCUTANEOUS;  Surgeon: Marcine Matar, MD;  Location: [facility];  Service: Urology;  Laterality: Right;         reports that she has never smoked. She has never used smokeless  tobacco. She reports that she does not drink alcohol or use illicit drugs. family history includes Cancer in her father; Emphysema in her father; Heart disease in her father; and Heart disease (age of onset: 4) in her mother. Allergies  Allergen Reactions  . Morphine Anaphylaxis  . Baclofen     GI upset  . Lisinopril Cough  . Metoclopramide Hcl     Reglan, irritable  . Penicillins     " MAJOR CASE OF HIVES"      Review of Systems  Constitutional: Negative for fatigue and unexpected weight change.  Eyes: Negative for visual disturbance.  Respiratory: Negative for cough, chest tightness, shortness of breath and wheezing.   Cardiovascular: Negative for chest pain, palpitations and leg swelling.  Endocrine: Negative for polydipsia and polyuria.  Neurological: Negative for dizziness, seizures, syncope, weakness, light-headedness and headaches.  Hematological: Negative for adenopathy.  Psychiatric/Behavioral: Negative for dysphoric mood.       Objective:   Physical Exam  Constitutional: She appears well-developed and well-nourished.  HENT:  Right Ear: External ear normal.  Left Ear: External ear normal.  Mouth/Throat: Oropharynx is clear and moist.  Neck: Neck supple. No thyromegaly present.  Cardiovascular: Normal rate and regular rhythm.   Pulmonary/Chest: Effort normal and breath sounds normal. No respiratory distress. She has no wheezes. She has no rales.  Musculoskeletal: She exhibits no edema.  Patient has venous support hose on  Psychiatric: She has a normal mood and affect. Her behavior is normal.          Assessment & Plan:  #1 type 2 diabetes. History of good control. Recheck A1c. Routine followup 6 months #2 hypertension well controlled #3 hyperlipidemia controlled by labs 6 months ago. Continue simvastatin

## 2012-12-03 NOTE — Telephone Encounter (Signed)
e

## 2012-12-22 ENCOUNTER — Other Ambulatory Visit: Payer: Self-pay | Admitting: Family Medicine

## 2013-01-25 ENCOUNTER — Telehealth: Payer: Self-pay | Admitting: Family Medicine

## 2013-01-25 DIAGNOSIS — G809 Cerebral palsy, unspecified: Secondary | ICD-10-CM

## 2013-01-25 NOTE — Telephone Encounter (Signed)
PT husband calling to request a RX for wheelchair repair be faxed to the service dept of [employer] at [phone]. Please assist.

## 2013-01-26 NOTE — Telephone Encounter (Signed)
Ok to send

## 2013-01-27 NOTE — Telephone Encounter (Signed)
Faxed over to [phone]

## 2013-03-29 LAB — HM DIABETES EYE EXAM

## 2013-04-02 IMAGING — CR DG CHEST 2V
2 series · 2 of 2 positions shown · non-contrast
Comparison: None.

CLINICAL DATA: Preop right percutaneous nephrolithotomy

CHEST - 2 VIEW

[w chest lat]
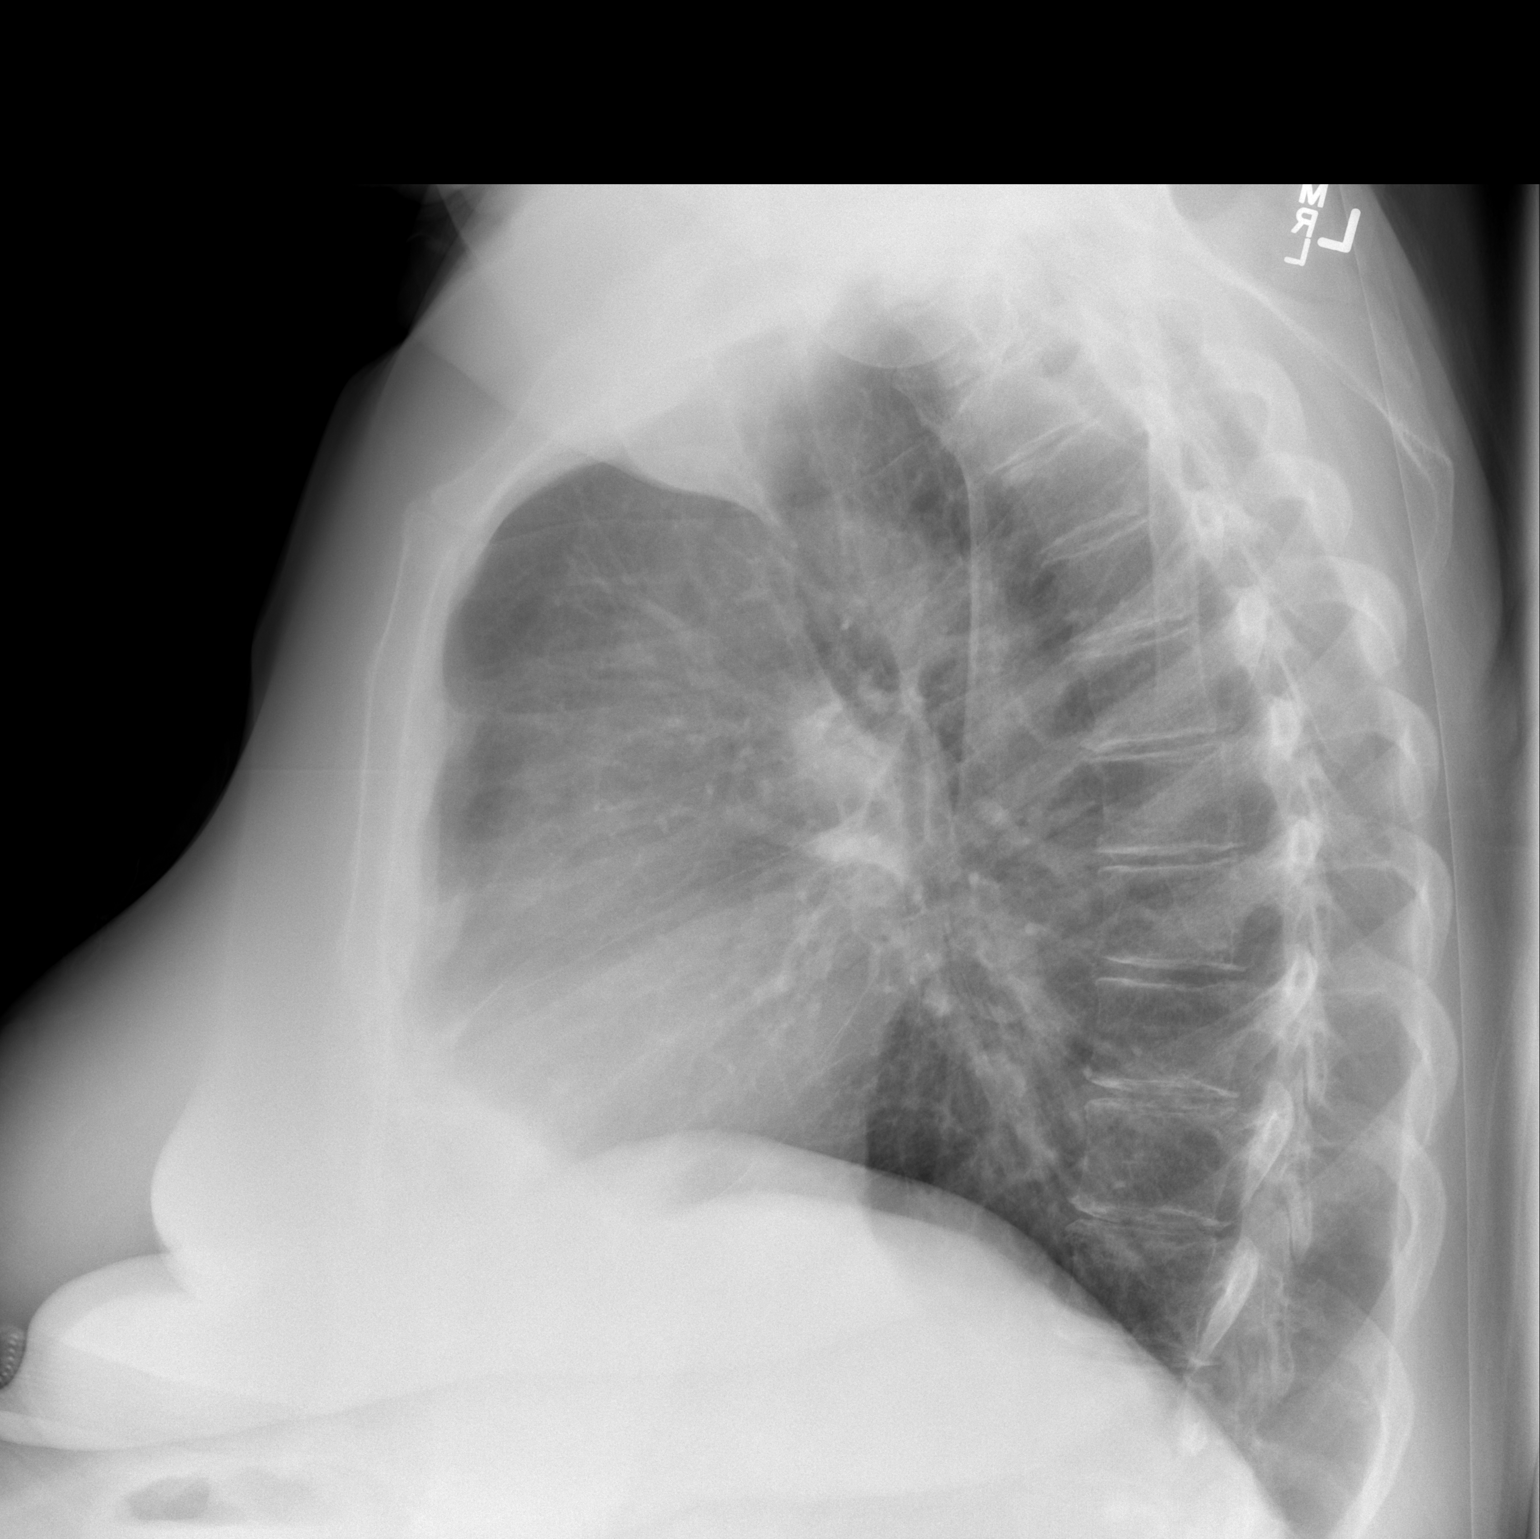

[view not recorded]
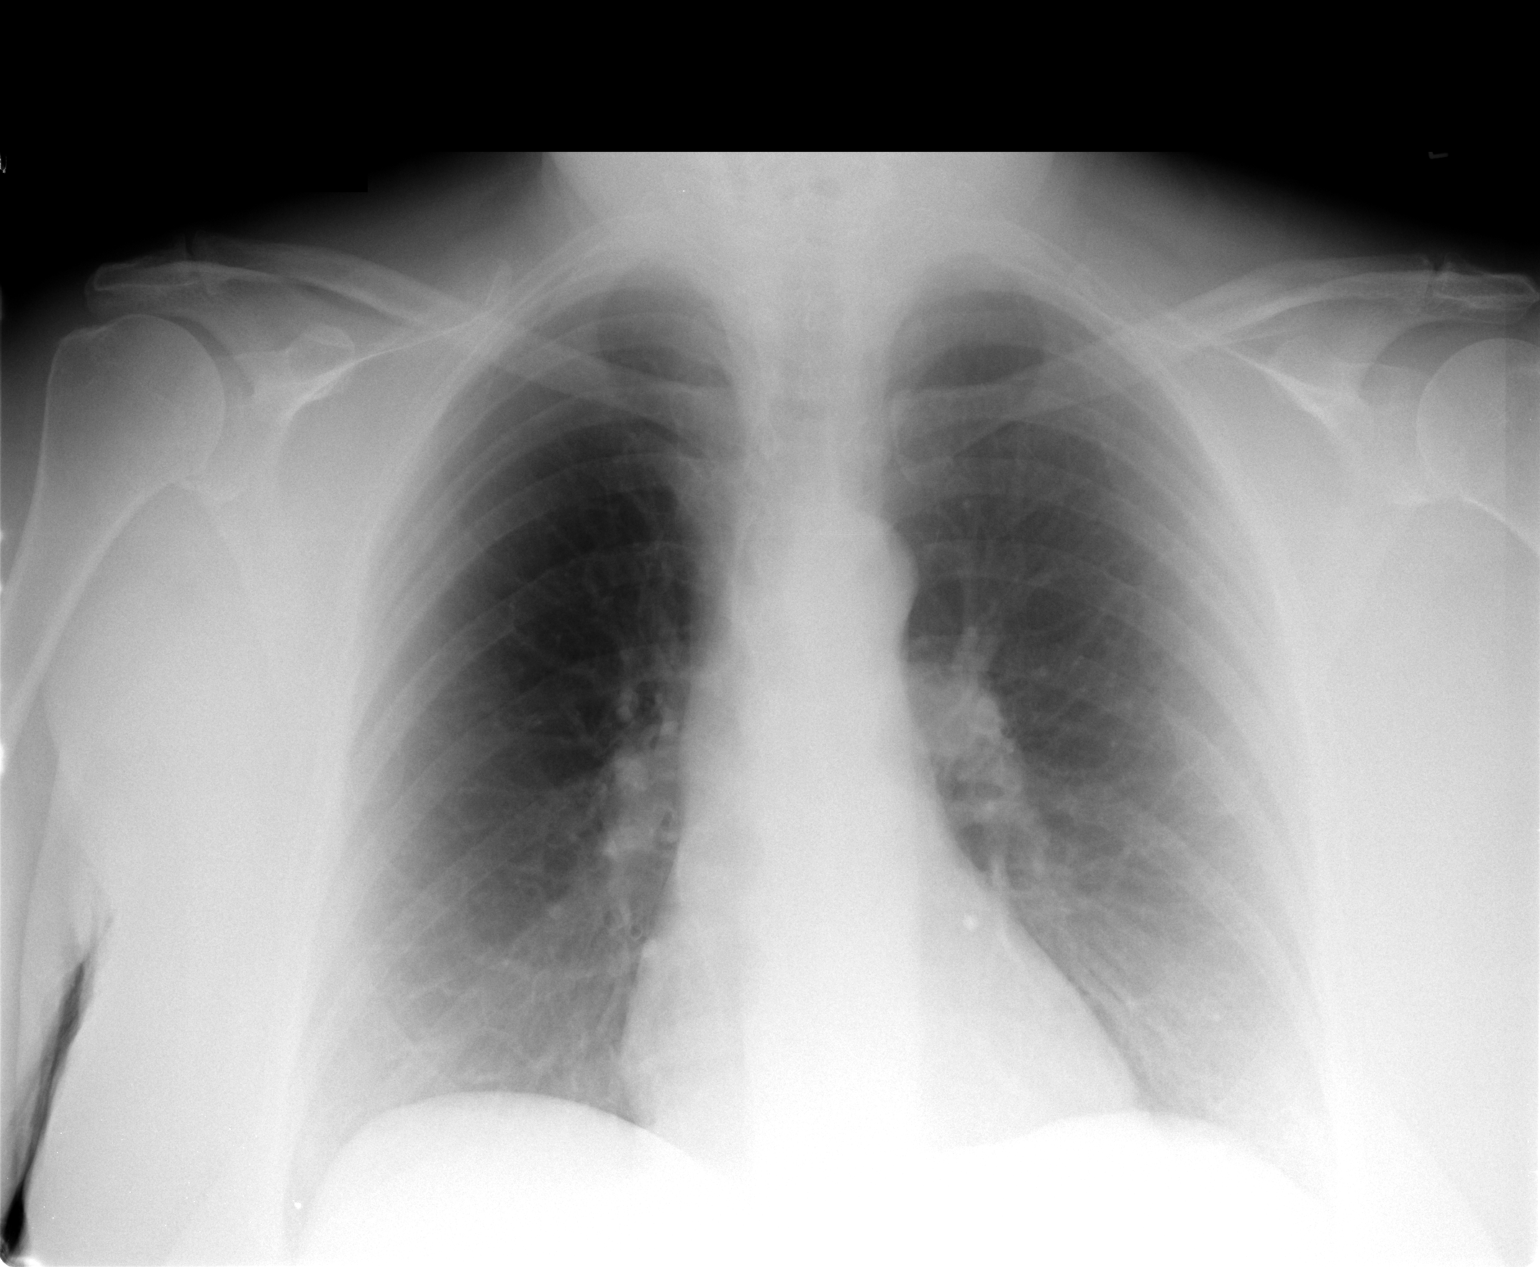

[2 of 2 positions shown; findings below may reference images not displayed]

FINDINGS: Lungs are clear.  No pleural effusion or pneumothorax.

Cardiomediastinal silhouette is within normal limits.

Mild degenerative changes of the visualized thoracolumbar spine.
IMPRESSION: No evidence of acute cardiopulmonary disease.

## 2013-04-15 ENCOUNTER — Ambulatory Visit (INDEPENDENT_AMBULATORY_CARE_PROVIDER_SITE_OTHER): Payer: Medicare Other

## 2013-04-15 DIAGNOSIS — Z23 Encounter for immunization: Secondary | ICD-10-CM

## 2013-04-22 ENCOUNTER — Other Ambulatory Visit: Payer: Self-pay | Admitting: Family Medicine

## 2013-05-06 ENCOUNTER — Other Ambulatory Visit: Payer: Self-pay | Admitting: Family Medicine

## 2013-06-06 ENCOUNTER — Encounter: Payer: Self-pay | Admitting: Family Medicine

## 2013-06-06 ENCOUNTER — Ambulatory Visit (INDEPENDENT_AMBULATORY_CARE_PROVIDER_SITE_OTHER): Payer: Medicare Other | Admitting: Family Medicine

## 2013-06-06 VITALS — BP 132/68 | HR 75 | Temp 98.3°F

## 2013-06-06 DIAGNOSIS — E119 Type 2 diabetes mellitus without complications: Secondary | ICD-10-CM

## 2013-06-06 DIAGNOSIS — E785 Hyperlipidemia, unspecified: Secondary | ICD-10-CM

## 2013-06-06 DIAGNOSIS — I10 Essential (primary) hypertension: Secondary | ICD-10-CM

## 2013-06-06 LAB — LIPID PANEL
Cholesterol: 162 mg/dL (ref 0–200)
HDL: 39.7 mg/dL (ref >39.00)
Triglycerides: 223 mg/dL — ABNORMAL HIGH (ref 0.0–149.0)

## 2013-06-06 LAB — BASIC METABOLIC PANEL
Calcium: 9.9 mg/dL (ref 8.4–10.5)
GFR: 72.44 mL/min (ref >60.00)
Sodium: 139 mEq/L (ref 135–145)

## 2013-06-06 LAB — HEPATIC FUNCTION PANEL
ALT: 41 U/L — ABNORMAL HIGH (ref 0–35)
Albumin: 4.3 g/dL (ref 3.5–5.2)
Bilirubin, Direct: 0 mg/dL (ref 0.0–0.3)
Total Protein: 7.4 g/dL (ref 6.0–8.3)

## 2013-06-06 LAB — HEMOGLOBIN A1C: Hgb A1c MFr Bld: 7.2 % — ABNORMAL HIGH (ref 4.6–6.5)

## 2013-06-06 IMAGING — CR DG CHEST 2V
2 series · 2 of 2 positions shown · non-contrast
Comparison: 09/25/2011.

CLINICAL DATA: Fever.  Nonsmoker.

CHEST - 2 VIEW

[w chest lat]
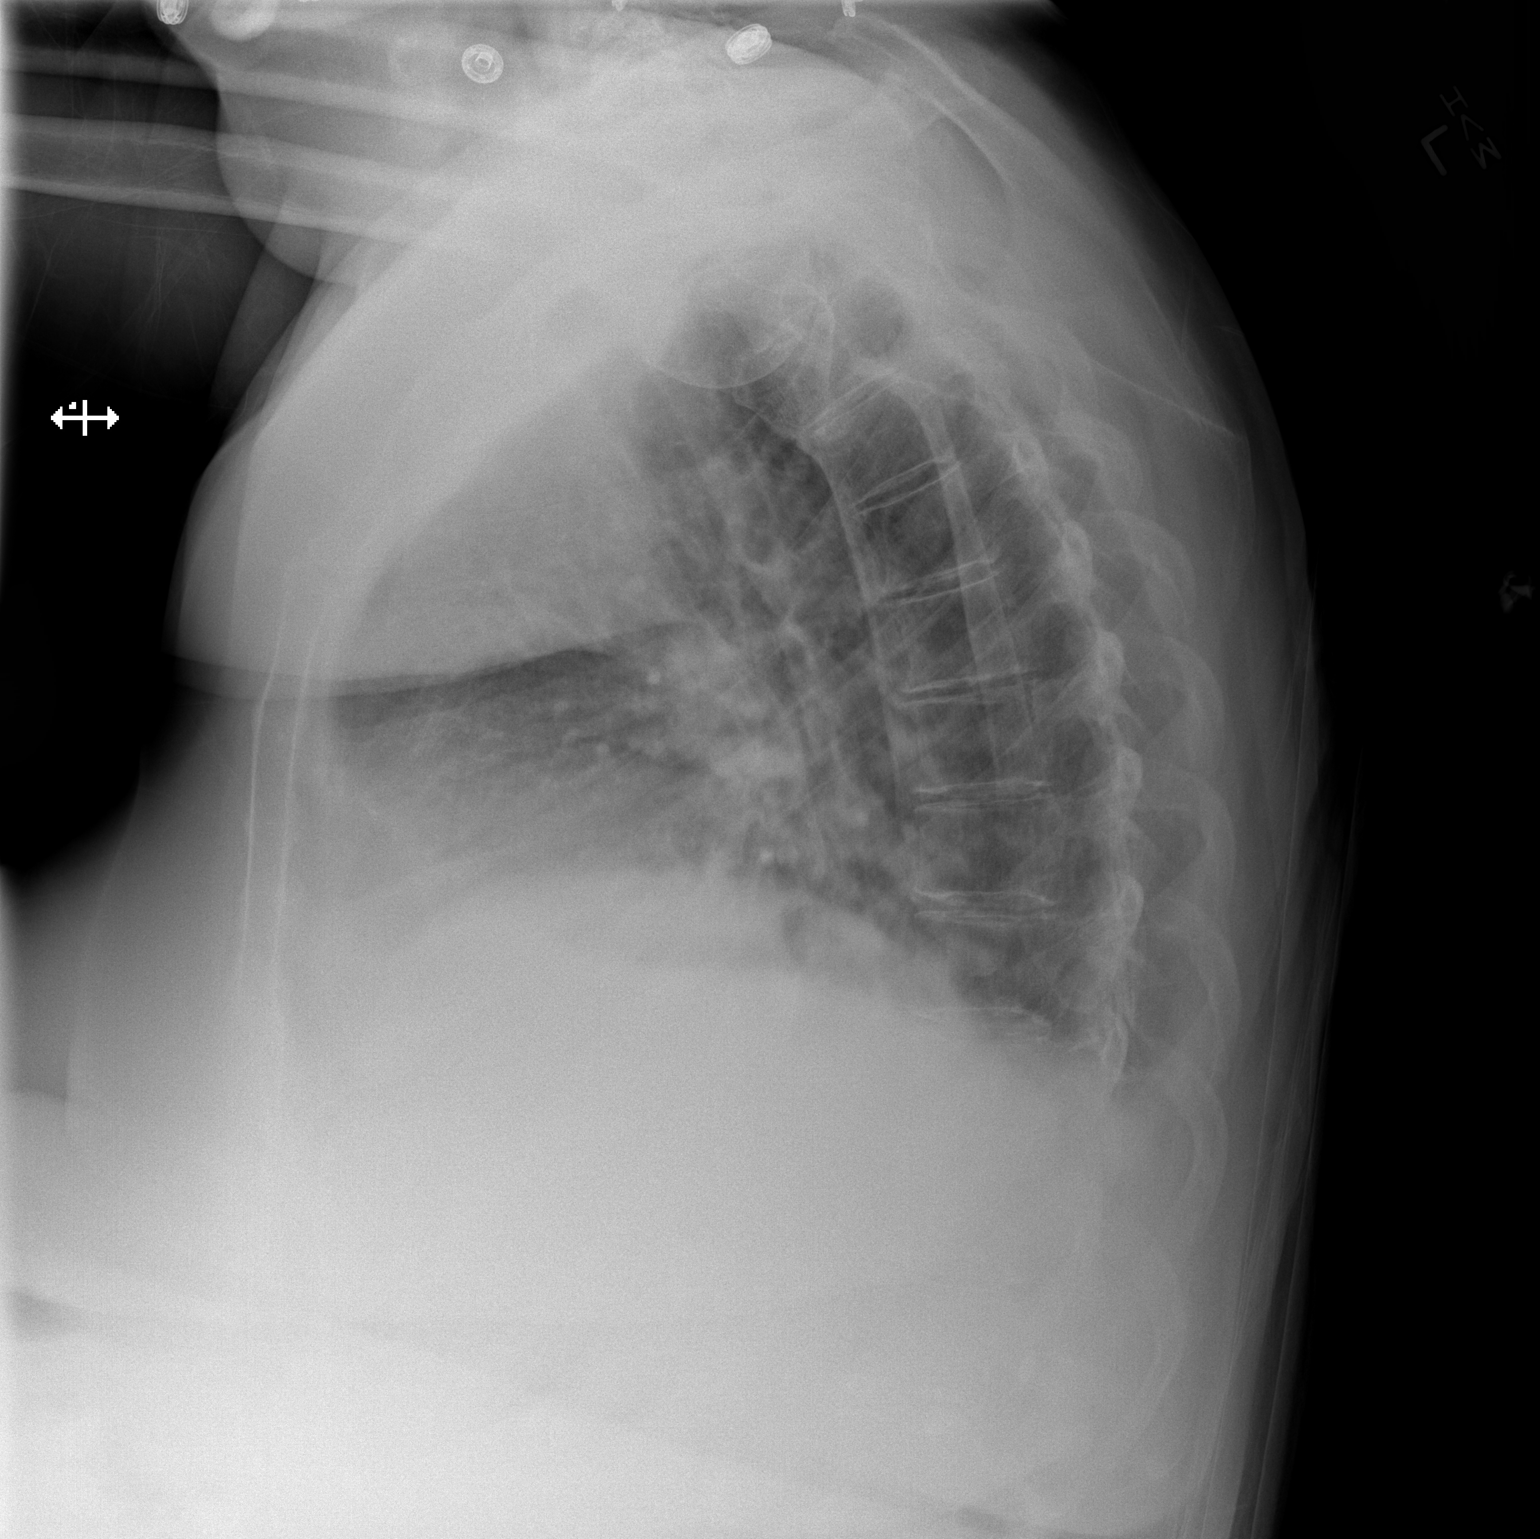

[x chest ap]
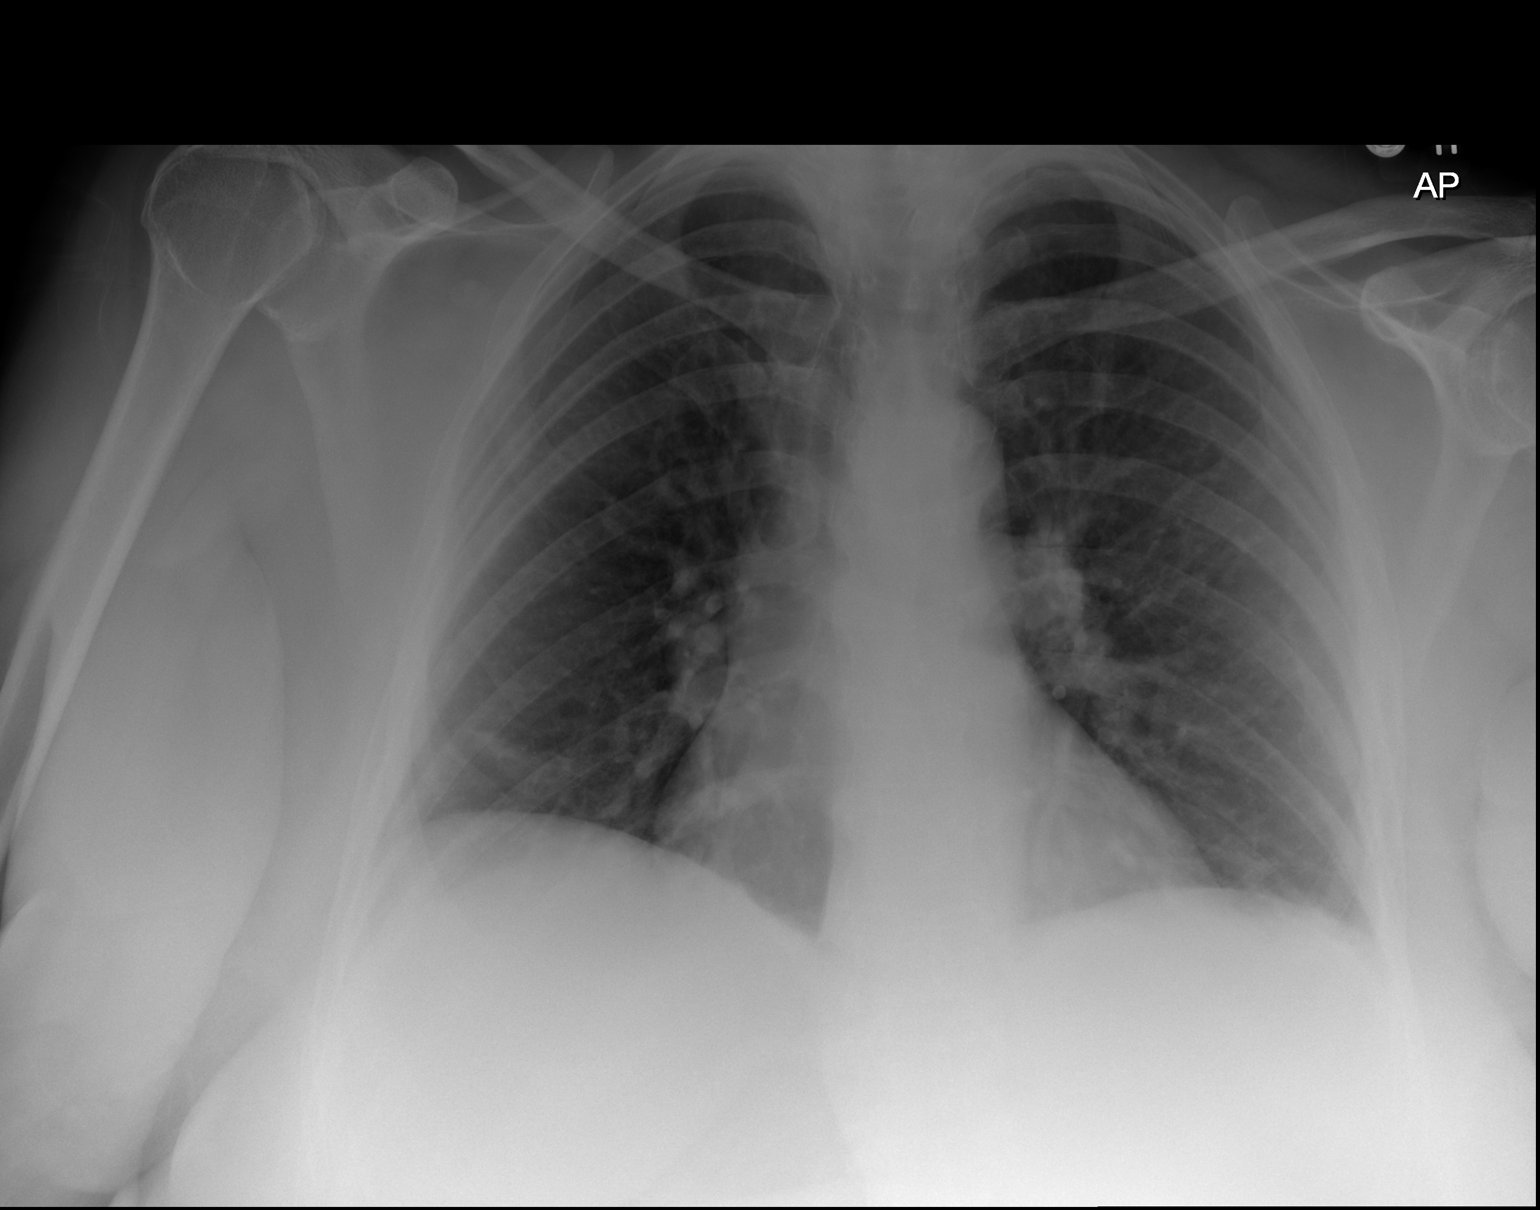

[2 of 2 positions shown; findings below may reference images not displayed]

FINDINGS: Question right base infiltrate. On the frontal view this
looks like atelectasis but on the lateral view is suspicious for
infiltrate given the history.  This can be evaluated follow-up.

Mild cardiomegaly.  Central pulmonary vascular prominence.
Minimally tortuous aorta.
IMPRESSION: Question right base infiltrate as noted above.

## 2013-06-06 NOTE — Progress Notes (Signed)
Subjective:    Patient ID: Jordan [name], female    DOB: 1945/12/01, 67 y.o.   MRN: [id number]  HPI Patient here for medical followup She has history of obesity, type 2 diabetes, hypertension, hyperlipidemia, cerebral palsy. Type 2 diabetes has been fairly well controlled. Last A1c 6.6%. Fasting blood sugars have been up some recently ranging 128 to 145. No symptoms of hyperglycemia. Currently takes only metformin for her diabetes.  Hypertension treated with losartan and furosemide. She has some chronic leg edema and wears support hose regularly. No orthostasis.  Hyperlipidemia on simvastatin 40 mg daily. No history of any CAD. No recent chest pains.  Past Medical History  Diagnosis Date  . HYPERLIPIDEMIA 11/29/2009  . HYPERTENSION 11/29/2009  . COLONIC POLYPS 07/11/2010  . CEREBRAL PALSY 07/11/2010    PT CAN TRANSFER BED TO CHAIR--CAN STAND BRIEFLY--BUT NOT ABLE TO WALK; RT LEG HYPEREXTENDS BACKWARD AND IS WEAK-HAS A POWER CHAIR  . Diabetes mellitus     [facility] MED FOR DIABETES  . Blood transfusion 1992    AFTER  SURGERY TO REMOVE BLADDER  . GERD (gastroesophageal reflux disease)     PAST HISTORY GERD--WATCHES DIET - NOT ON ANY MEDS FOR GERD  . Incisional hernia     NO PAIN-NOT CAUSING ANY DISCOMFORT  . Arthritis     OA  . PONV (postoperative nausea and vomiting)    Past Surgical History  Procedure Laterality Date  . Appendectomy    . Cholecystectomy    . Tonsillectomy    . Revision urostomy cutaneous  1992  . Abdominal hysterectomy  1990    partial  . Radical cystectomy  1992    neurogenic bladder sec CP  . Rt ankle fusion  1975    SEVERAL RT ANKLE SURGERIES PRIOR TO THE FUSION  . Left carpal tunnel release x 2    . Right carpal tunnel release    . Lumbar sympathetectomies x 2    . 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 Nephrolithotomy  10/01/2011    Procedure: NEPHROLITHOTOMY PERCUTANEOUS;  Surgeon: Marcine Matar, MD;  Location: [facility];  Service:  Urology;  Laterality: Right;         reports that she has never smoked. She has never used smokeless tobacco. She reports that she does not drink alcohol or use illicit drugs. family history includes Cancer in her father; Emphysema in her father; Heart disease in her father; Heart disease (age of onset: 60) in her mother. Allergies  Allergen Reactions  . Morphine Anaphylaxis  . Baclofen     GI upset  . Lisinopril Cough  . Metoclopramide Hcl     Reglan, irritable  . Penicillins     " MAJOR CASE OF HIVES"      Review of Systems  Constitutional: Negative for fever, chills and unexpected weight change.  Eyes: Negative for visual disturbance.  Respiratory: Negative for cough, chest tightness, shortness of breath and wheezing.   Cardiovascular: Positive for leg swelling. Negative for chest pain and palpitations.  Gastrointestinal: Negative for nausea, vomiting and abdominal pain.  Endocrine: Negative for polydipsia and polyuria.  Neurological: Negative for dizziness, seizures, syncope, weakness, light-headedness and headaches.  Psychiatric/Behavioral: Negative for dysphoric mood.       Objective:   Physical Exam  Constitutional: She appears well-developed and well-nourished.  Neck: Neck supple.  Cardiovascular: Normal rate.   Pulmonary/Chest: Effort normal and breath sounds normal. No respiratory distress. She has no wheezes. She has no  rales.  Musculoskeletal: She exhibits edema.  Trace edema legs bilaterally  Skin:  Feet reveal no skin lesions. Good distal foot pulses. Good capillary refill. No calluses. Normal sensation with monofilament testing           Assessment & Plan:  #1 type 2 diabetes. History of good control. Continue current medications. Consider titration of metformin if indicated. Repeat A1c #2 hypertension. Well controlled. Continue current medications. Check basic metabolic panel #3 dyslipidemia. Check lipid and hepatic panel

## 2013-06-06 NOTE — Progress Notes (Signed)
Pre visit review using our clinic review tool, if applicable. No additional management support is needed unless otherwise documented below in the visit note. 

## 2013-06-07 ENCOUNTER — Encounter: Payer: Self-pay | Admitting: Family Medicine

## 2013-06-07 LAB — LDL CHOLESTEROL, DIRECT: Direct LDL: 109.7 mg/dL

## 2013-06-14 ENCOUNTER — Telehealth: Payer: Self-pay | Admitting: Family Medicine

## 2013-06-14 NOTE — Telephone Encounter (Signed)
Caller: Bret/Patient; Phone: [phone]; Reason for Call: Patient states she was seen in the office 06/06/13 for her Diabetes check.  Patient states she had labwork completed and was told that if her A1C was 7 or above that her Medication dosage would be adjusted.  Patient states she viewed her labwork results via My Chart and A1C was 7.  2.  Patient states she did not receive any additional medication instructions.  Patient states she is feeling well and is asymptomatic.   States fasting blood glucose readings range from 128-135 in the mornings.   RN reviewed Counselling psychologist Health Record.  Documentation noted from Dr.  Caryl Never on 06/07/13 1253: "Your labs are all relatively stable.  A1C is up slightly and I would like to see if we can get this down with some weight loss and dietary change vs additional medication   Let's plan follow up in 3 months to reassess A1C.  " Patient informed of above instruction.  Patient verbalizes understanding and agreeable.

## 2013-07-13 ENCOUNTER — Other Ambulatory Visit: Payer: Self-pay | Admitting: Family Medicine

## 2013-07-14 ENCOUNTER — Other Ambulatory Visit: Payer: Self-pay | Admitting: Family Medicine

## 2013-08-18 ENCOUNTER — Other Ambulatory Visit: Payer: Self-pay | Admitting: Family Medicine

## 2013-08-31 ENCOUNTER — Ambulatory Visit: Payer: Medicare Other | Admitting: Family Medicine

## 2013-10-05 ENCOUNTER — Other Ambulatory Visit: Payer: Self-pay | Admitting: Family Medicine

## 2013-10-10 ENCOUNTER — Ambulatory Visit (INDEPENDENT_AMBULATORY_CARE_PROVIDER_SITE_OTHER): Payer: Medicare HMO | Admitting: Family Medicine

## 2013-10-10 ENCOUNTER — Encounter: Payer: Self-pay | Admitting: Family Medicine

## 2013-10-10 VITALS — BP 130/72 | HR 72 | Temp 98.6°F

## 2013-10-10 DIAGNOSIS — M25519 Pain in unspecified shoulder: Secondary | ICD-10-CM

## 2013-10-10 DIAGNOSIS — M25511 Pain in right shoulder: Secondary | ICD-10-CM

## 2013-10-10 DIAGNOSIS — E119 Type 2 diabetes mellitus without complications: Secondary | ICD-10-CM

## 2013-10-10 DIAGNOSIS — I10 Essential (primary) hypertension: Secondary | ICD-10-CM

## 2013-10-10 LAB — HEMOGLOBIN A1C: Hgb A1c MFr Bld: 6.7 % — ABNORMAL HIGH (ref 4.6–6.5)

## 2013-10-10 LAB — HM DIABETES FOOT EXAM: HM Diabetic Foot Exam: NORMAL

## 2013-10-10 NOTE — Progress Notes (Signed)
Subjective:    Patient ID: Jordan [name], female    DOB: 11/13/1945, 68 y.o.   MRN: [id number]  HPI  Patient seen for medical followup. She has history of obesity, type 2 diabetes, hypertension, dyslipidemia.  Has acute issue of right shoulder pain. Onset about one to 2 months ago. No specific injury but she first noted pain after reaching. Her pain is very poorly localized but mostly periscapular. She denies any radiculopathy symptoms. No weakness. She has some pain with reaching overhead and abduction. Has tried Tylenol without relief. Denies any falls. Pain interfering some with sleep.  Type 2 diabetes. She takes metformin 1000 mg in the morning and 500 mg at night. Recent fasting blood sugars have been somewhat elevated. Last A1c 7.2%. She's trying to reduce dietary carbs. She had eye exam in October with no retinopathy but she is developing bilateral cataracts  Hypertension treated with losartan. Blood pressures been stable. No dizziness.  Past Medical History  Diagnosis Date  . HYPERLIPIDEMIA 11/29/2009  . HYPERTENSION 11/29/2009  . COLONIC POLYPS 07/11/2010  . CEREBRAL PALSY 07/11/2010    PT CAN TRANSFER BED TO CHAIR--CAN STAND BRIEFLY--BUT NOT ABLE TO WALK; RT LEG HYPEREXTENDS BACKWARD AND IS WEAK-HAS A POWER CHAIR  . Diabetes mellitus     ORAL MED FOR DIABETES  . Blood transfusion 1992    AFTER  SURGERY TO REMOVE BLADDER  . GERD (gastroesophageal reflux disease)     PAST HISTORY GERD--WATCHES DIET - NOT ON ANY MEDS FOR GERD  . Incisional hernia     NO PAIN-NOT CAUSING ANY DISCOMFORT  . Arthritis     OA  . PONV (postoperative nausea and vomiting)    Past Surgical History  Procedure Laterality Date  . Appendectomy    . Cholecystectomy    . Tonsillectomy    . Revision urostomy cutaneous  1992  . Abdominal hysterectomy  1990    partial  . Radical cystectomy  1992    neurogenic bladder sec CP  . Rt ankle fusion  1975    SEVERAL RT ANKLE SURGERIES PRIOR TO THE FUSION  .  Left carpal tunnel release x 2    . Right carpal tunnel release    . Lumbar sympathetectomies x 2    . 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 Nephrolithotomy  10/01/2011    Procedure: NEPHROLITHOTOMY PERCUTANEOUS;  Surgeon: Franchot Gallo, MD;  Location: [facility];  Service: Urology;  Laterality: Right;         reports that she has never smoked. She has never used smokeless tobacco. She reports that she does not drink alcohol or use illicit drugs. family history includes Cancer in her father; Emphysema in her father; Heart disease in her father; Heart disease (age of onset: 91) in her mother. Allergies  Allergen Reactions  . Morphine Anaphylaxis  . Baclofen     GI upset  . Lisinopril Cough  . Metoclopramide Hcl     Reglan, irritable  . Penicillins     " MAJOR CASE OF HIVES"    Review of Systems  Constitutional: Negative for fatigue.  Eyes: Negative for visual disturbance.  Respiratory: Negative for cough, chest tightness, shortness of breath and wheezing.   Cardiovascular: Negative for chest pain, palpitations and leg swelling.  Endocrine: Negative for polydipsia and polyuria.  Neurological: Negative for dizziness, seizures, syncope, weakness, light-headedness and headaches.       Objective:   Physical Exam  Constitutional: She appears well-developed and  well-nourished.  Neck: Neck supple. No thyromegaly present.  Cardiovascular: Normal rate and regular rhythm.   Pulmonary/Chest: Effort normal and breath sounds normal. No respiratory distress. She has no wheezes. She has no rales.  Musculoskeletal: She exhibits no edema.  Right shoulder reveals full range of motion. She has some pain with abduction against resistance but is able to internally rotate fairly well. She has some mild tenderness over her mid and medial right scapular region. No spinal tenderness  Skin: No rash noted.          Assessment & Plan:  #1 type 2 diabetes. History of fair control.  Recheck A1c. Encouraged continue weight loss. If still elevated consider further titration of metformin versus additional medication and we went over various options #2 hypertension which is stable at goal #3 dyslipidemia. Handout on hypertriglyceridemia given #4 right shoulder pain. Doubt rotator cuff tear. Set up sports medicine referral

## 2013-10-10 NOTE — Progress Notes (Signed)
Pre visit review using our clinic review tool, if applicable. No additional management support is needed unless otherwise documented below in the visit note. 

## 2013-10-10 NOTE — Patient Instructions (Signed)
Hypertriglyceridemia  Diet for High blood levels of Triglycerides Most fats in food are triglycerides. Triglycerides in your blood are stored as fat in your body. High levels of triglycerides in your blood may put you at a greater risk for heart disease and stroke.  Normal triglyceride levels are less than 150 mg/dL. Borderline high levels are 150-199 mg/dl. High levels are 200 - 499 mg/dL, and very high triglyceride levels are greater than 500 mg/dL. The decision to treat high triglycerides is generally based on the level. For people with borderline or high triglyceride levels, treatment includes weight loss and exercise. Drugs are recommended for people with very high triglyceride levels. Many people who need treatment for high triglyceride levels have metabolic syndrome. This syndrome is a collection of disorders that often include: insulin resistance, high blood pressure, blood clotting problems, high cholesterol and triglycerides. TESTING PROCEDURE FOR TRIGLYCERIDES  You should not eat 4 hours before getting your triglycerides measured. The normal range of triglycerides is between 10 and 250 milligrams per deciliter (mg/dl). Some people may have extreme levels (1000 or above), but your triglyceride level may be too high if it is above 150 mg/dl, depending on what other risk factors you have for heart disease.  People with high blood triglycerides may also have high blood cholesterol levels. If you have high blood cholesterol as well as high blood triglycerides, your risk for heart disease is probably greater than if you only had high triglycerides. High blood cholesterol is one of the main risk factors for heart disease. CHANGING YOUR DIET  Your weight can affect your blood triglyceride level. If you are more than 20% above your ideal body weight, you may be able to lower your blood triglycerides by losing weight. Eating less and exercising regularly is the best way to combat this. Fat provides more  calories than any other food. The best way to lose weight is to eat less fat. Only 30% of your total calories should come from fat. Less than 7% of your diet should come from saturated fat. A diet low in fat and saturated fat is the same as a diet to decrease blood cholesterol. By eating a diet lower in fat, you may lose weight, lower your blood cholesterol, and lower your blood triglyceride level.  Eating a diet low in fat, especially saturated fat, may also help you lower your blood triglyceride level. Ask your dietitian to help you figure how much fat you can eat based on the number of calories your caregiver has prescribed for you.  Exercise, in addition to helping with weight loss may also help lower triglyceride levels.   Alcohol can increase blood triglycerides. You may need to stop drinking alcoholic beverages.  Too much carbohydrate in your diet may also increase your blood triglycerides. Some complex carbohydrates are necessary in your diet. These may include bread, rice, potatoes, other starchy vegetables and cereals.  Reduce "simple" carbohydrates. These may include pure sugars, candy, honey, and jelly without losing other nutrients. If you have the kind of high blood triglycerides that is affected by the amount of carbohydrates in your diet, you will need to eat less sugar and less high-sugar foods. Your caregiver can help you with this.  Adding 2-4 grams of fish oil (EPA+ DHA) may also help lower triglycerides. Speak with your caregiver before adding any supplements to your regimen. Following the Diet  Maintain your ideal weight. Your caregivers can help you with a diet. Generally, eating less food and getting more   exercise will help you lose weight. Joining a weight control group may also help. Ask your caregivers for a good weight control group in your area.  Eat low-fat foods instead of high-fat foods. This can help you lose weight too.  These foods are lower in fat. Eat MORE of these:    Dried beans, peas, and lentils.  Egg whites.  Low-fat cottage cheese.  Fish.  Lean cuts of meat, such as round, sirloin, rump, and flank (cut extra fat off meat you fix).  Whole grain breads, cereals and pasta.  Skim and nonfat dry milk.  Low-fat yogurt.  Poultry without the skin.  Cheese made with skim or part-skim milk, such as mozzarella, parmesan, farmers', ricotta, or pot cheese. These are higher fat foods. Eat LESS of these:   Whole milk and foods made from whole milk, such as American, blue, cheddar, monterey jack, and swiss cheese  High-fat meats, such as luncheon meats, sausages, knockwurst, bratwurst, hot dogs, ribs, corned beef, ground pork, and regular ground beef.  Fried foods. Limit saturated fats in your diet. Substituting unsaturated fat for saturated fat may decrease your blood triglyceride level. You will need to read package labels to know which products contain saturated fats.  These foods are high in saturated fat. Eat LESS of these:   Fried pork skins.  Whole milk.  Skin and fat from poultry.  Palm oil.  Butter.  Shortening.  Cream cheese.  Bacon.  Margarines and baked goods made from listed oils.  Vegetable shortenings.  Chitterlings.  Fat from meats.  Coconut oil.  Palm kernel oil.  Lard.  Cream.  Sour cream.  Fatback.  Coffee whiteners and non-dairy creamers made with these oils.  Cheese made from whole milk. Use unsaturated fats (both polyunsaturated and monounsaturated) moderately. Remember, even though unsaturated fats are better than saturated fats; you still want a diet low in total fat.  These foods are high in unsaturated fat:   Canola oil.  Sunflower oil.  Mayonnaise.  Almonds.  Peanuts.  Pine nuts.  Margarines made with these oils.  Safflower oil.  Olive oil.  Avocados.  Cashews.  Peanut butter.  Sunflower seeds.  Soybean oil.  Peanut  oil.  Olives.  Pecans.  Walnuts.  Pumpkin seeds. Avoid sugar and other high-sugar foods. This will decrease carbohydrates without decreasing other nutrients. Sugar in your food goes rapidly to your blood. When there is excess sugar in your blood, your liver may use it to make more triglycerides. Sugar also contains calories without other important nutrients.  Eat LESS of these:   Sugar, brown sugar, powdered sugar, jam, jelly, preserves, honey, syrup, molasses, pies, candy, cakes, cookies, frosting, pastries, colas, soft drinks, punches, fruit drinks, and regular gelatin.  Avoid alcohol. Alcohol, even more than sugar, may increase blood triglycerides. In addition, alcohol is high in calories and low in nutrients. Ask for sparkling water, or a diet soft drink instead of an alcoholic beverage. Suggestions for planning and preparing meals   Bake, broil, grill or roast meats instead of frying.  Remove fat from meats and skin from poultry before cooking.  Add spices, herbs, lemon juice or vinegar to vegetables instead of salt, rich sauces or gravies.  Use a non-stick skillet without fat or use no-stick sprays.  Cool and refrigerate stews and broth. Then remove the hardened fat floating on the surface before serving.  Refrigerate meat drippings and skim off fat to make low-fat gravies.  Serve more fish.  Use less butter,   margarine and other high-fat spreads on bread or vegetables.  Use skim or reconstituted non-fat dry milk for cooking.  Cook with low-fat cheeses.  Substitute low-fat yogurt or cottage cheese for all or part of the sour cream in recipes for sauces, dips or congealed salads.  Use half yogurt/half mayonnaise in salad recipes.  Substitute evaporated skim milk for cream. Evaporated skim milk or reconstituted non-fat dry milk can be whipped and substituted for whipped cream in certain recipes.  Choose fresh fruits for dessert instead of high-fat foods such as pies or  cakes. Fruits are naturally low in fat. When Dining Out   Order low-fat appetizers such as fruit or vegetable juice, pasta with vegetables or tomato sauce.  Select clear, rather than cream soups.  Ask that dressings and gravies be served on the side. Then use less of them.  Order foods that are baked, broiled, poached, steamed, stir-fried, or roasted.  Ask for margarine instead of butter, and use only a small amount.  Drink sparkling water, unsweetened tea or coffee, or diet soft drinks instead of alcohol or other sweet beverages. QUESTIONS AND ANSWERS ABOUT OTHER FATS IN THE BLOOD: SATURATED FAT, TRANS FAT, AND CHOLESTEROL What is trans fat? Trans fat is a type of fat that is formed when vegetable oil is hardened through a process called hydrogenation. This process helps makes foods more solid, gives them shape, and prolongs their shelf life. Trans fats are also called hydrogenated or partially hydrogenated oils.  What do saturated fat, trans fat, and cholesterol in foods have to do with heart disease? Saturated fat, trans fat, and cholesterol in the diet all raise the level of LDL "bad" cholesterol in the blood. The higher the LDL cholesterol, the greater the risk for coronary heart disease (CHD). Saturated fat and trans fat raise LDL similarly.  What foods contain saturated fat, trans fat, and cholesterol? High amounts of saturated fat are found in animal products, such as fatty cuts of meat, chicken skin, and full-fat dairy products like butter, whole milk, cream, and cheese, and in tropical vegetable oils such as palm, palm kernel, and coconut oil. Trans fat is found in some of the same foods as saturated fat, such as vegetable shortening, some margarines (especially hard or stick margarine), crackers, cookies, baked goods, fried foods, salad dressings, and other processed foods made with partially hydrogenated vegetable oils. Small amounts of trans fat also occur naturally in some animal  products, such as milk products, beef, and lamb. Foods high in cholesterol include liver, other organ meats, egg yolks, shrimp, and full-fat dairy products. How can I use the new food label to make heart-healthy food choices? Check the Nutrition Facts panel of the food label. Choose foods lower in saturated fat, trans fat, and cholesterol. For saturated fat and cholesterol, you can also use the Percent Daily Value (%DV): 5% DV or less is low, and 20% DV or more is high. (There is no %DV for trans fat.) Use the Nutrition Facts panel to choose foods low in saturated fat and cholesterol, and if the trans fat is not listed, read the ingredients and limit products that list shortening or hydrogenated or partially hydrogenated vegetable oil, which tend to be high in trans fat. POINTS TO REMEMBER:   Discuss your risk for heart disease with your caregivers, and take steps to reduce risk factors.  Change your diet. Choose foods that are low in saturated fat, trans fat, and cholesterol.  Add exercise to your daily routine if   it is not already being done. Participate in physical activity of moderate intensity, like brisk walking, for at least 30 minutes on most, and preferably all days of the week. No time? Break the 30 minutes into three, 10-minute segments during the day.  Stop smoking. If you do smoke, contact your caregiver to discuss ways in which they can help you quit.  Do not use street drugs.  Maintain a normal weight.  Maintain a healthy blood pressure.  Keep up with your blood work for checking the fats in your blood as directed by your caregiver. Document Released: 03/20/2004 Document Revised: 12/02/2011 Document Reviewed: 10/16/2008 [employer] Patient Information 2014 [employer].  

## 2013-10-11 ENCOUNTER — Telehealth: Payer: Self-pay | Admitting: Family Medicine

## 2013-10-11 NOTE — Telephone Encounter (Signed)
Relevant patient education assigned to patient using Emmi. ° °

## 2013-10-14 ENCOUNTER — Telehealth: Payer: Self-pay

## 2013-10-14 NOTE — Telephone Encounter (Signed)
Relevant patient education assigned to patient using Emmi. ° °

## 2013-10-16 ENCOUNTER — Other Ambulatory Visit: Payer: Self-pay | Admitting: Family Medicine

## 2013-10-17 ENCOUNTER — Telehealth: Payer: Self-pay | Admitting: Family Medicine

## 2013-10-17 MED ORDER — METFORMIN HCL 500 MG PO TABS: ORAL_TABLET | ORAL | Status: DC

## 2013-10-17 NOTE — Telephone Encounter (Signed)
Rx sent to pharmacy   

## 2013-10-17 NOTE — Telephone Encounter (Signed)
[id number] - [place], [place] - [address] is requesting re-fill on metFORMIN (GLUCOPHAGE) 500 MG tablet

## 2013-10-21 ENCOUNTER — Ambulatory Visit: Payer: Medicare HMO | Admitting: Family Medicine

## 2013-11-28 ENCOUNTER — Ambulatory Visit: Payer: Medicare Other | Admitting: Family Medicine

## 2013-12-29 ENCOUNTER — Telehealth: Payer: Self-pay | Admitting: Family Medicine

## 2013-12-29 MED ORDER — SIMVASTATIN 40 MG PO TABS: ORAL_TABLET | ORAL | Status: DC

## 2013-12-29 NOTE — Telephone Encounter (Signed)
Rx sent to pharmacy   

## 2013-12-29 NOTE — Telephone Encounter (Signed)
[id number] - [place],  - [address] is requesting re-fill on simvastatin (ZOCOR) 40 MG tablet

## 2014-02-09 ENCOUNTER — Ambulatory Visit (INDEPENDENT_AMBULATORY_CARE_PROVIDER_SITE_OTHER): Payer: Medicare HMO | Admitting: Family Medicine

## 2014-02-09 ENCOUNTER — Encounter: Payer: Self-pay | Admitting: Family Medicine

## 2014-02-09 VITALS — BP 120/68 | HR 78 | Temp 98.0°F

## 2014-02-09 DIAGNOSIS — Z23 Encounter for immunization: Secondary | ICD-10-CM

## 2014-02-09 DIAGNOSIS — I10 Essential (primary) hypertension: Secondary | ICD-10-CM

## 2014-02-09 DIAGNOSIS — E119 Type 2 diabetes mellitus without complications: Secondary | ICD-10-CM

## 2014-02-09 LAB — BASIC METABOLIC PANEL
BUN: 30 mg/dL — ABNORMAL HIGH (ref 6–23)
CHLORIDE: 105 meq/L (ref 96–112)
CO2: 25 meq/L (ref 19–32)
Calcium: 10.6 mg/dL — ABNORMAL HIGH (ref 8.4–10.5)
Creatinine, Ser: 0.9 mg/dL (ref 0.4–1.2)
GFR: 64.2 mL/min (ref >60.00)
Glucose, Bld: 127 mg/dL — ABNORMAL HIGH (ref 70–99)
Potassium: 4.6 mEq/L (ref 3.5–5.1)
Sodium: 141 mEq/L (ref 135–145)

## 2014-02-09 LAB — HEMOGLOBIN A1C: Hgb A1c MFr Bld: 6.6 % — ABNORMAL HIGH (ref 4.6–6.5)

## 2014-02-09 NOTE — Addendum Note (Signed)
Addended by: Marcina Millard on: 02/09/2014 09:16 AM   Modules accepted: Orders

## 2014-02-09 NOTE — Progress Notes (Signed)
Subjective:    Patient ID: Jordan [name], female    DOB: [birth year], 68 y.o.   MRN: [id number]  HPI Patient seen for medical followup. She has obesity, type 2 diabetes, hypertension, hyperlipidemia, history of seizures cerebral palsy, recurrent kidney stones. Diabetes has been fairly well controlled. Last A1c 6.7%. She remains on metformin. Fasting blood sugars have been stable.  Hypertension treated with losartan. No chest pains. No dizziness. Chronic stable mild peripheral edema. She remains on furosemide. Compliant with all medications.  Past Medical History  Diagnosis Date  . HYPERLIPIDEMIA 11/29/2009  . HYPERTENSION 11/29/2009  . COLONIC POLYPS 07/11/2010  . CEREBRAL PALSY 07/11/2010    PT CAN TRANSFER BED TO CHAIR--CAN STAND BRIEFLY--BUT NOT ABLE TO WALK; RT LEG HYPEREXTENDS BACKWARD AND IS WEAK-HAS A POWER CHAIR  . Diabetes mellitus     ORAL MED FOR DIABETES  . Blood transfusion 1992    AFTER  SURGERY TO REMOVE BLADDER  . GERD (gastroesophageal reflux disease)     PAST HISTORY GERD--WATCHES DIET - NOT ON ANY MEDS FOR GERD  . Incisional hernia     NO PAIN-NOT CAUSING ANY DISCOMFORT  . Arthritis     OA  . PONV (postoperative nausea and vomiting)    Past Surgical History  Procedure Laterality Date  . Appendectomy    . Cholecystectomy    . Tonsillectomy    . Revision urostomy cutaneous  1992  . Abdominal hysterectomy  1990    partial  . Radical cystectomy  1992    neurogenic bladder sec CP  . Rt ankle fusion  1975    SEVERAL RT ANKLE SURGERIES PRIOR TO THE FUSION  . Left carpal tunnel release x 2    . Right carpal tunnel release    . Lumbar sympathetectomies x 2    . 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 Nephrolithotomy  10/01/2011    Procedure: NEPHROLITHOTOMY PERCUTANEOUS;  Surgeon: Franchot Gallo, MD;  Location: [facility];  Service: Urology;  Laterality: Right;         reports that she has never smoked. She has never used smokeless tobacco. She  reports that she does not drink alcohol or use illicit drugs. family history includes Cancer in her father; Emphysema in her father; Heart disease in her father; Heart disease (age of onset: 62) in her mother. Allergies  Allergen Reactions  . Morphine Anaphylaxis  . Baclofen     GI upset  . Lisinopril Cough  . Metoclopramide Hcl     Reglan, irritable  . Penicillins     " MAJOR CASE OF HIVES"      Review of Systems  Constitutional: Negative for fatigue.  Eyes: Negative for visual disturbance.  Respiratory: Negative for cough, chest tightness, shortness of breath and wheezing.   Cardiovascular: Negative for chest pain, palpitations and leg swelling.  Endocrine: Negative for polydipsia and polyuria.  Neurological: Negative for dizziness, seizures, syncope, weakness, light-headedness and headaches.       Objective:   Physical Exam  Constitutional: She appears well-developed and well-nourished. No distress.  Cardiovascular: Normal rate and regular rhythm.   Pulmonary/Chest: Effort normal and breath sounds normal. No respiratory distress. She has no wheezes. She has no rales.  Musculoskeletal: She exhibits no edema.  Skin:  Feet are dry but no specific lesions          Assessment & Plan:  #1 type 2 diabetes. Recent good control. Recheck A1c #2 hypertension. Recheck basic metabolic panel.  Routine followup in 4 months and repeat lipid panel then #3 health maintenance. Flu vaccine given

## 2014-02-09 NOTE — Progress Notes (Signed)
Pre visit review using our clinic review tool, if applicable. No additional management support is needed unless otherwise documented below in the visit note. 

## 2014-03-21 ENCOUNTER — Telehealth: Payer: Self-pay | Admitting: Family Medicine

## 2014-03-21 NOTE — Telephone Encounter (Signed)
Pt's plan sent fax requesting 90 day re-fills on simvastatin (ZOCOR) 40 MG tablet sent to [id number] - [place], [place] - [address]

## 2014-03-22 MED ORDER — SIMVASTATIN 40 MG PO TABS: ORAL_TABLET | ORAL | Status: DC

## 2014-03-22 NOTE — Telephone Encounter (Signed)
Rx sent to pharmacy   

## 2014-05-18 ENCOUNTER — Telehealth: Payer: Self-pay | Admitting: Family Medicine

## 2014-05-18 MED ORDER — GLUCOSE BLOOD VI STRP: ORAL_STRIP | Status: DC

## 2014-05-18 NOTE — Telephone Encounter (Signed)
[id number] [place], [place] [address] [phone] is requesting re-fill on ACCU-CHEK SMARTVIEW test strip

## 2014-05-18 NOTE — Telephone Encounter (Signed)
Rx sent to pharmacy   

## 2014-06-12 ENCOUNTER — Ambulatory Visit (INDEPENDENT_AMBULATORY_CARE_PROVIDER_SITE_OTHER): Payer: Medicare HMO | Admitting: Family Medicine

## 2014-06-12 ENCOUNTER — Encounter: Payer: Self-pay | Admitting: Family Medicine

## 2014-06-12 VITALS — BP 122/72 | HR 70 | Temp 98.0°F

## 2014-06-12 DIAGNOSIS — M791 Myalgia: Secondary | ICD-10-CM

## 2014-06-12 DIAGNOSIS — M609 Myositis, unspecified: Secondary | ICD-10-CM

## 2014-06-12 DIAGNOSIS — E119 Type 2 diabetes mellitus without complications: Secondary | ICD-10-CM

## 2014-06-12 DIAGNOSIS — E785 Hyperlipidemia, unspecified: Secondary | ICD-10-CM

## 2014-06-12 DIAGNOSIS — IMO0001 Reserved for inherently not codable concepts without codable children: Secondary | ICD-10-CM

## 2014-06-12 DIAGNOSIS — I10 Essential (primary) hypertension: Secondary | ICD-10-CM

## 2014-06-12 DIAGNOSIS — Z23 Encounter for immunization: Secondary | ICD-10-CM

## 2014-06-12 DIAGNOSIS — Z87442 Personal history of urinary calculi: Secondary | ICD-10-CM

## 2014-06-12 LAB — BASIC METABOLIC PANEL
BUN: 38 mg/dL — ABNORMAL HIGH (ref 6–23)
CHLORIDE: 105 meq/L (ref 96–112)
CO2: 23 mEq/L (ref 19–32)
Calcium: 10.7 mg/dL — ABNORMAL HIGH (ref 8.4–10.5)
Creatinine, Ser: 1 mg/dL (ref 0.4–1.2)
GFR: 59.62 mL/min — ABNORMAL LOW (ref >60.00)
Glucose, Bld: 153 mg/dL — ABNORMAL HIGH (ref 70–99)
POTASSIUM: 4.2 meq/L (ref 3.5–5.1)
SODIUM: 139 meq/L (ref 135–145)

## 2014-06-12 LAB — LIPID PANEL
CHOL/HDL RATIO: 6
Cholesterol: 158 mg/dL (ref 0–200)
HDL: 27.4 mg/dL — ABNORMAL LOW (ref >39.00)
NONHDL: 130.6
TRIGLYCERIDES: 206 mg/dL — AB (ref 0.0–149.0)
VLDL: 41.2 mg/dL — AB (ref 0.0–40.0)

## 2014-06-12 LAB — HEPATIC FUNCTION PANEL
ALBUMIN: 3.5 g/dL (ref 3.5–5.2)
ALK PHOS: 88 U/L (ref 39–117)
ALT: 76 U/L — ABNORMAL HIGH (ref 0–35)
AST: 34 U/L (ref 0–37)
Bilirubin, Direct: 0.1 mg/dL (ref 0.0–0.3)
TOTAL PROTEIN: 7.7 g/dL (ref 6.0–8.3)
Total Bilirubin: 0.3 mg/dL (ref 0.2–1.2)

## 2014-06-12 LAB — LDL CHOLESTEROL, DIRECT: Direct LDL: 98.3 mg/dL

## 2014-06-12 LAB — HEMOGLOBIN A1C: Hgb A1c MFr Bld: 7.7 % — ABNORMAL HIGH (ref 4.6–6.5)

## 2014-06-12 NOTE — Progress Notes (Signed)
Pre visit review using our clinic review tool, if applicable. No additional management support is needed unless otherwise documented below in the visit note. 

## 2014-06-12 NOTE — Progress Notes (Signed)
Subjective:    Patient ID: Jordan [name], female    DOB: 1946/04/19, 68 y.o.   MRN: [id number]  HPI Patient is seen for medical follow-up. She has past medical history significant for obesity, type 2 diabetes, cerebral palsy, hypertension, hyperlipidemia. She has history of total cystectomy secondary to neurogenic bladder from cerebral palsy. She is followed by urologist. She did have fever last week which is resolved. She denied any respiratory symptoms.  She is complaining some myalgias involving her upper and lower extremities and knees. She thinks this may be secondary to simvastatin. She has consider coenzyme Q10 but has not yet started this. Denies any chest pains. No dizziness. Blood sugars slightly elevated past couple of weeks. She is maintained on metformin  Past Medical History  Diagnosis Date  . HYPERLIPIDEMIA 11/29/2009  . HYPERTENSION 11/29/2009  . COLONIC POLYPS 07/11/2010  . CEREBRAL PALSY 07/11/2010    PT CAN TRANSFER BED TO CHAIR--CAN STAND BRIEFLY--BUT NOT ABLE TO WALK; RT LEG HYPEREXTENDS BACKWARD AND IS WEAK-HAS A POWER CHAIR  . Diabetes mellitus     ORAL MED FOR DIABETES  . Blood transfusion 1992    AFTER  SURGERY TO REMOVE BLADDER  . GERD (gastroesophageal reflux disease)     PAST HISTORY GERD--WATCHES DIET - NOT ON ANY MEDS FOR GERD  . Incisional hernia     NO PAIN-NOT CAUSING ANY DISCOMFORT  . Arthritis     OA  . PONV (postoperative nausea and vomiting)    Past Surgical History  Procedure Laterality Date  . Appendectomy    . Cholecystectomy    . Tonsillectomy    . Revision urostomy cutaneous  1992  . Abdominal hysterectomy  1990    partial  . Radical cystectomy  1992    neurogenic bladder sec CP  . Rt ankle fusion  1975    SEVERAL RT ANKLE SURGERIES PRIOR TO THE FUSION  . Left carpal tunnel release x 2    . Right carpal tunnel release    . Lumbar sympathetectomies x 2    . 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 Nephrolithotomy  10/01/2011     Procedure: NEPHROLITHOTOMY PERCUTANEOUS;  Surgeon: Franchot Gallo, MD;  Location: [facility];  Service: Urology;  Laterality: Right;         reports that she has never smoked. She has never used smokeless tobacco. She reports that she does not drink alcohol or use illicit drugs. family history includes Cancer in her father; Emphysema in her father; Heart disease in her father; Heart disease (age of onset: 32) in her mother. Allergies  Allergen Reactions  . Morphine Anaphylaxis  . Baclofen     GI upset  . Lisinopril Cough  . Metoclopramide Hcl     Reglan, irritable  . Penicillins     " MAJOR CASE OF HIVES"      Review of Systems  Constitutional: Negative for activity change, appetite change, fatigue and unexpected weight change.  HENT: Negative for hearing loss, sore throat and trouble swallowing.   Respiratory: Negative for cough, shortness of breath and wheezing.   Cardiovascular: Negative for chest pain and palpitations.  Gastrointestinal: Negative for nausea, vomiting, abdominal pain, blood in stool and abdominal distention.  Endocrine: Negative for polydipsia.  Genitourinary: Negative for dysuria and hematuria.  Musculoskeletal: Positive for myalgias and arthralgias.  Skin: Negative for rash.  Neurological: Negative for dizziness, syncope, weakness and headaches.  Hematological: Negative for adenopathy.  Psychiatric/Behavioral: Negative for confusion  and dysphoric mood.       Objective:   Physical Exam  Constitutional: She appears well-developed and well-nourished.  Neck: Neck supple.  Cardiovascular: Normal rate and regular rhythm.   Pulmonary/Chest: Effort normal and breath sounds normal. No respiratory distress. She has no wheezes. She has no rales.  Musculoskeletal: She exhibits no edema.  Neurological: She is alert.  Skin:  Feet are slightly dry. No lesions. No calluses. Normal sensory function to touch          Assessment & Plan:  #1 type 2 diabetes.  History of good control. Recheck A1c. Continue regular eye follow-up #2 hypertension stable and at goal. Continue current medication. Check basic metabolic panel #3 health maintenance. Recommend Prevnar 13. She's had previous Pneumovax already had flu vaccine #4 myalgias. Possibly related to statin. She will try: Some coenzyme Q10 first. If not relieved with that she will stop her simvastatin for 3 or 4 weeks and see if symptoms improved. Be in touch if symptoms resolve off statin

## 2014-06-12 NOTE — Addendum Note (Signed)
Addended by: Marcina Millard on: 06/12/2014 09:22 AM   Modules accepted: Orders

## 2014-06-13 ENCOUNTER — Other Ambulatory Visit: Payer: Self-pay | Admitting: Family Medicine

## 2014-06-13 ENCOUNTER — Other Ambulatory Visit: Payer: Self-pay

## 2014-06-13 DIAGNOSIS — I10 Essential (primary) hypertension: Secondary | ICD-10-CM

## 2014-06-13 DIAGNOSIS — E119 Type 2 diabetes mellitus without complications: Secondary | ICD-10-CM

## 2014-06-14 ENCOUNTER — Other Ambulatory Visit: Payer: Self-pay | Admitting: Family Medicine

## 2014-06-15 ENCOUNTER — Other Ambulatory Visit (INDEPENDENT_AMBULATORY_CARE_PROVIDER_SITE_OTHER): Payer: Medicare HMO

## 2014-06-15 DIAGNOSIS — E119 Type 2 diabetes mellitus without complications: Secondary | ICD-10-CM

## 2014-06-15 LAB — TSH: TSH: 1.55 u[IU]/mL (ref 0.35–4.50)

## 2014-06-15 LAB — HEMOGLOBIN A1C: Hgb A1c MFr Bld: 7.8 % — ABNORMAL HIGH (ref 4.6–6.5)

## 2014-06-15 LAB — SEDIMENTATION RATE: Sed Rate: 103 mm/hr — ABNORMAL HIGH (ref 0–22)

## 2014-06-19 LAB — PARATHYROID HORMONE, INTACT (NO CA): PTH: 8 pg/mL — AB (ref 14–64)

## 2014-06-20 ENCOUNTER — Telehealth: Payer: Self-pay

## 2014-06-20 DIAGNOSIS — M2550 Pain in unspecified joint: Secondary | ICD-10-CM

## 2014-06-20 NOTE — Telephone Encounter (Signed)
Can you please give the patient a call back she would like to go more in detail about her results.

## 2014-06-20 NOTE — Telephone Encounter (Signed)
Spoke with patient.  Reviewed labs to date.  We will check SPEP, RF, and CCP antibodies with her high sed rate and polyarthralgia.  She has sister with RA.  Pt does have some upper back and neck pain and consider PMR- if all above negative, though PMR would not explain her hypercalcemia.

## 2014-06-21 ENCOUNTER — Other Ambulatory Visit (INDEPENDENT_AMBULATORY_CARE_PROVIDER_SITE_OTHER): Payer: Medicare HMO

## 2014-06-21 DIAGNOSIS — M2550 Pain in unspecified joint: Secondary | ICD-10-CM

## 2014-06-22 LAB — RHEUMATOID FACTOR

## 2014-06-23 LAB — PROTEIN ELECTROPHORESIS, SERUM
ALPHA-2-GLOBULIN: 14.6 % — AB (ref 7.1–11.8)
Albumin ELP: 54.5 % — ABNORMAL LOW (ref 55.8–66.1)
Alpha-1-Globulin: 5.3 % — ABNORMAL HIGH (ref 2.9–4.9)
BETA GLOBULIN: 7.2 % (ref 4.7–7.2)
Beta 2: 6 % (ref 3.2–6.5)
Gamma Globulin: 12.4 % (ref 11.1–18.8)
Total Protein, Serum Electrophoresis: 6.9 g/dL (ref 6.0–8.3)

## 2014-06-23 LAB — CYCLIC CITRUL PEPTIDE ANTIBODY, IGG: Cyclic Citrullin Peptide Ab: <2 U/mL (ref 0.0–5.0)

## 2014-06-28 ENCOUNTER — Ambulatory Visit (INDEPENDENT_AMBULATORY_CARE_PROVIDER_SITE_OTHER): Payer: Medicare HMO | Admitting: Family Medicine

## 2014-06-28 ENCOUNTER — Encounter: Payer: Self-pay | Admitting: Internal Medicine

## 2014-06-28 ENCOUNTER — Encounter: Payer: Self-pay | Admitting: Family Medicine

## 2014-06-28 DIAGNOSIS — R700 Elevated erythrocyte sedimentation rate: Secondary | ICD-10-CM

## 2014-06-28 DIAGNOSIS — R059 Cough, unspecified: Secondary | ICD-10-CM

## 2014-06-28 DIAGNOSIS — R05 Cough: Secondary | ICD-10-CM

## 2014-06-28 DIAGNOSIS — Z1211 Encounter for screening for malignant neoplasm of colon: Secondary | ICD-10-CM

## 2014-06-28 LAB — HEPATIC FUNCTION PANEL
ALT: 24 U/L (ref 0–35)
AST: 16 U/L (ref 0–37)
Albumin: 4 g/dL (ref 3.5–5.2)
Alkaline Phosphatase: 78 U/L (ref 39–117)
BILIRUBIN TOTAL: 0.4 mg/dL (ref 0.2–1.2)
Bilirubin, Direct: 0 mg/dL (ref 0.0–0.3)
Total Protein: 7.4 g/dL (ref 6.0–8.3)

## 2014-06-28 LAB — BASIC METABOLIC PANEL
BUN: 32 mg/dL — ABNORMAL HIGH (ref 6–23)
CALCIUM: 9.8 mg/dL (ref 8.4–10.5)
CO2: 24 meq/L (ref 19–32)
CREATININE: 0.87 mg/dL (ref 0.40–1.20)
Chloride: 105 mEq/L (ref 96–112)
GFR: 68.4 mL/min (ref >60.00)
GLUCOSE: 129 mg/dL — AB (ref 70–99)
Potassium: 3.9 mEq/L (ref 3.5–5.1)
Sodium: 138 mEq/L (ref 135–145)

## 2014-06-28 LAB — SEDIMENTATION RATE: Sed Rate: 62 mm/hr — ABNORMAL HIGH (ref 0–22)

## 2014-06-28 NOTE — Patient Instructions (Signed)
Hypercalcemia Hypercalcemia means the calcium in your blood is too high. Calcium in our blood is important for the control of many things, such as:  Blood clotting.  Conducting of nerve impulses.  Muscle contraction.  Maintaining teeth and bone health.  Other body functions. In the bloodstream, calcium maintains a constant balance with another mineral, phosphate. Calcium is absorbed into the body through the small intestine. This is helped by vitamin D. Calcium levels are maintained mostly by vitamin D and a hormone (parathyroid hormone). But the kidneys also help. Hypercalcemia can happen when the concentration of calcium is too high for the kidneys to maintain balance. The body maintains a balance between the calcium we eat and the calcium already in our body. If calcium intake is increased or we cannot use calcium properly, there may be problems. Some common sources of calcium are:   Dairy products.  Nuts.  Eggs.  Whole grains.  Legumes.  Green leafy vegetables. CAUSES There are many causes of this condition, but some common ones are:  Hyperparathyroidism. This is an overactivity of the parathyroid gland.  Cancers of the breast, kidney, lung, head, and neck are common causes of calcium increases.  Medications that cause you to urinate more often (diuretics), nausea, vomiting, and diarrhea also increase the calcium in the blood.  Overuse of calcium-containing antacids. SYMPTOMS  Many patients with mild hypercalcemia have no symptoms. For those with symptoms, common problems include:  Loss of appetite.  Constipation.  Increased thirst.  Heart rhythm changes.  Abnormal thinking.  Nausea.  Abdominal pain.  Kidney stones.  Mood swings.  Coma and death when severe.  Vomiting.  Increased urination.  High blood pressure.  Confusion. DIAGNOSIS   Your caregiver will do a medical history and perform a physical exam on you.  Calcium and parathyroid hormone  (PTH) may be measured with a blood test. TREATMENT   The treatment depends on the calcium level and what is causing the higher level. Hypercalcemia can be life threatening. Fast lowering of the calcium level may be necessary.  With normal kidney function, fluids can be given by vein to clear the excess calcium. Hemodialysis works well to reduce dangerous calcium levels if there is poor kidney function. This is a procedure in which a machine is used to filter out unwanted substances. The blood is then returned to the body.  Drugs, such as diuretics, can be given after adequate fluid intake is established. These medications help the kidneys get rid of extra calcium. Drugs that lessen (inhibit) bone loss are helpful in gaining long-term control. Phosphate pills help lower high calcium levels caused by a low supply of phosphate. Anti-inflammatory agents such as steroids are helpful with some cancers and toxic levels of vitamin D.  Treatment of the underlying cause of the hypercalcemia will also correct the imbalance. Hyperparathyroidism is usually treated by surgical removal of one or more of the parathyroid glands and any tissue, other than the glands themselves, that is producing too much hormone.  The hypercalcemia caused by cancer is difficult to treat without controlling the cancer. Symptoms can be improved with fluids and drug therapy as outlined above. PROGNOSIS   Surgery to remove the parathyroid glands is usually successful. This also depends on the amount of damage to the kidneys and whether or not it can be treated.  Mild hypercalcemia can be controlled with good fluid intake and the use of effective medications.  Hypercalcemia often develops as a late complication of cancer. The expected outlook   is poor without effective anticancer therapy. PREVENTION   If you are at risk for developing hypercalcemia, be familiar with early symptoms. Report these to your caregiver.  Good fluid intake  (up to four quarts of liquid a day if possible) is helpful.  Try to control nausea and vomiting, and treat fevers to avoid dehydration.  Lowering the amount of calcium in your diet is not necessary. High blood calcium reduces absorption of calcium in the intestine.  Stay as active as possible. SEEK IMMEDIATE MEDICAL CARE IF:   You develop chest pain, sweating, or shortness of breath.  You get confused, feel faint or pass out.  You develop severe nausea and vomiting. MAKE SURE YOU:   Understand these instructions.  Will watch your condition.  Will get help right away if you are not doing well or get worse. Document Released: 08/16/2004 Document Revised: 10/17/2013 Document Reviewed: 05/28/2010 Exit[employer] Patient Information 2015 [employer]. This information is not intended to replace advice given to you by your health care provider. Make sure you discuss any questions you have with your health care provider.  

## 2014-06-28 NOTE — Progress Notes (Signed)
Subjective:    Patient ID: Jordan [name], female    DOB: May 17, 1946, 69 y.o.   MRN: [id number]  HPI Patient here for follow-up regarding hypercalcemia and elevated sedimentation rate. Back in December she presented here and had corrected calcium of 11.2 (on routine labs). We obtained several other screening labs. Thyroid normal by TSH screening. Parathyroid hormone level slightly low. Sedimentation rate elevated 103. Protein electrophoresis nonspecific inflammatory pattern. No M spike.  Patient takes no thiazides.  She's not had any recent appetite or weight changes. She has had some nonspecific arthralgias. Recent rheumatoid screening tests were negative. Mammogram not done and over 2 years. Last colonoscopy 2009. She had benign polyp. Father had colon cancer. She is due for repeat.  She has occasional dry cough. She has chronic dyspnea with exertion which she relates to her obesity and deconditioning. No changes there. No recent hemoptysis. No fevers or chills. No night sweats.  Past Medical History  Diagnosis Date  . HYPERLIPIDEMIA 11/29/2009  . HYPERTENSION 11/29/2009  . COLONIC POLYPS 07/11/2010  . CEREBRAL PALSY 07/11/2010    PT CAN TRANSFER BED TO CHAIR--CAN STAND BRIEFLY--BUT NOT ABLE TO WALK; RT LEG HYPEREXTENDS BACKWARD AND IS WEAK-HAS A POWER CHAIR  . Diabetes mellitus     ORAL MED FOR DIABETES  . Blood transfusion 1992    AFTER  SURGERY TO REMOVE BLADDER  . GERD (gastroesophageal reflux disease)     PAST HISTORY GERD--WATCHES DIET - NOT ON ANY MEDS FOR GERD  . Incisional hernia     NO PAIN-NOT CAUSING ANY DISCOMFORT  . Arthritis     OA  . PONV (postoperative nausea and vomiting)    Past Surgical History  Procedure Laterality Date  . Appendectomy    . Cholecystectomy    . Tonsillectomy    . Revision urostomy cutaneous  1992  . Abdominal hysterectomy  1990    partial  . Radical cystectomy  1992    neurogenic bladder sec CP  . Rt ankle fusion  1975    SEVERAL RT ANKLE  SURGERIES PRIOR TO THE FUSION  . Left carpal tunnel release x 2    . Right carpal tunnel release    . Lumbar sympathetectomies x 2    . 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 Nephrolithotomy  10/01/2011    Procedure: NEPHROLITHOTOMY PERCUTANEOUS;  Surgeon: Franchot Gallo, MD;  Location: [facility];  Service: Urology;  Laterality: Right;         reports that she has never smoked. She has never used smokeless tobacco. She reports that she does not drink alcohol or use illicit drugs. family history includes Cancer in her father; Emphysema in her father; Heart disease in her father; Heart disease (age of onset: 19) in her mother. Allergies  Allergen Reactions  . Morphine Anaphylaxis  . Baclofen     GI upset  . Lisinopril Cough  . Metoclopramide Hcl     Reglan, irritable  . Penicillins     " MAJOR CASE OF HIVES"      Review of Systems  Constitutional: Negative for fever, chills, appetite change and unexpected weight change.  Respiratory: Positive for cough and shortness of breath. Negative for wheezing.   Cardiovascular: Negative for chest pain and palpitations.  Gastrointestinal: Negative for abdominal pain.  Genitourinary: Negative for hematuria.  Neurological: Negative for headaches.  Hematological: Negative for adenopathy.  Psychiatric/Behavioral: Negative for confusion.       Objective:   Physical Exam  Constitutional: She is oriented to person, place, and time. She appears well-developed and well-nourished.  HENT:  Right Ear: External ear normal.  Left Ear: External ear normal.  Mouth/Throat: Oropharynx is clear and moist.  Neck: Neck supple. No thyromegaly present.  Cardiovascular: Normal rate and regular rhythm.   Pulmonary/Chest: Effort normal and breath sounds normal. No respiratory distress. She has no wheezes. She has no rales.  Lymphadenopathy:    She has no cervical adenopathy.  Neurological: She is alert and oriented to person, place, and time.           Assessment & Plan:  Patient here to follow-up regarding hypercalcemia and elevated sedimentation rate. We considered several things in differential hypercalcemia including hyperthyroidism, iatrogenic, multiple myeloma, primary hyperparathyroidism, sarcoidosis. Thyroid and parathyroid issues ruled out as above. Serum protein electrophoresis nonspecific pattern. She has occasional dry cough but low clinical suspicion for sarcoidosis. She's not had any appetite or weight changes to suggest likely malignancy. No thiazide use. Patient was taking possibly in excess of 1200 mg calcium per day and she has scaled back back herself  Nevertheless, we've recommended going ahead with mammography and set up repeat colonoscopy. Repeat labs with basic metabolic panel and sedimentation rate. Recommend chest x-ray to rule out bilateral hilar adenopathy, etc. and consider endocrinology referral for hypercalcemia.  Clinically, she does not have symptoms to suggest likely polymyalgia rheumatica (to explain elevated sed rate).

## 2014-06-28 NOTE — Progress Notes (Signed)
Pre visit review using our clinic review tool, if applicable. No additional management support is needed unless otherwise documented below in the visit note. 

## 2014-06-29 ENCOUNTER — Ambulatory Visit (HOSPITAL_COMMUNITY)
Admission: RE | Admit: 2014-06-29 | Discharge: 2014-06-29 | Disposition: A | Discharge status: Home / Self Care | Payer: Medicare HMO | Source: Ambulatory Visit | Attending: Family Medicine | Admitting: Family Medicine

## 2014-06-29 ENCOUNTER — Other Ambulatory Visit: Payer: Self-pay

## 2014-06-29 DIAGNOSIS — R05 Cough: Secondary | ICD-10-CM | POA: Diagnosis not present

## 2014-06-29 DIAGNOSIS — R059 Cough, unspecified: Secondary | ICD-10-CM

## 2014-06-29 MED ORDER — METFORMIN HCL 500 MG PO TABS: ORAL_TABLET | ORAL | Status: DC

## 2014-07-08 ENCOUNTER — Encounter (HOSPITAL_COMMUNITY): Payer: Self-pay

## 2014-07-08 ENCOUNTER — Inpatient Hospital Stay (HOSPITAL_COMMUNITY)
Admission: EM | Admit: 2014-07-08 | Discharge: 2014-07-12 | DRG: 872 | Disposition: A | Discharge status: Home / Self Care | Payer: Medicare HMO | Attending: Internal Medicine | Admitting: Internal Medicine

## 2014-07-08 DIAGNOSIS — N390 Urinary tract infection, site not specified: Secondary | ICD-10-CM

## 2014-07-08 DIAGNOSIS — N12 Tubulo-interstitial nephritis, not specified as acute or chronic: Secondary | ICD-10-CM | POA: Diagnosis present

## 2014-07-08 DIAGNOSIS — E669 Obesity, unspecified: Secondary | ICD-10-CM | POA: Diagnosis present

## 2014-07-08 DIAGNOSIS — K219 Gastro-esophageal reflux disease without esophagitis: Secondary | ICD-10-CM | POA: Diagnosis present

## 2014-07-08 DIAGNOSIS — Z809 Family history of malignant neoplasm, unspecified: Secondary | ICD-10-CM | POA: Diagnosis not present

## 2014-07-08 DIAGNOSIS — Z906 Acquired absence of other parts of urinary tract: Secondary | ICD-10-CM | POA: Diagnosis present

## 2014-07-08 DIAGNOSIS — G809 Cerebral palsy, unspecified: Secondary | ICD-10-CM | POA: Diagnosis present

## 2014-07-08 DIAGNOSIS — A419 Sepsis, unspecified organism: Principal | ICD-10-CM | POA: Diagnosis present

## 2014-07-08 DIAGNOSIS — Z993 Dependence on wheelchair: Secondary | ICD-10-CM | POA: Diagnosis not present

## 2014-07-08 DIAGNOSIS — Z7982 Long term (current) use of aspirin: Secondary | ICD-10-CM

## 2014-07-08 DIAGNOSIS — E785 Hyperlipidemia, unspecified: Secondary | ICD-10-CM | POA: Diagnosis present

## 2014-07-08 DIAGNOSIS — Z79899 Other long term (current) drug therapy: Secondary | ICD-10-CM | POA: Diagnosis not present

## 2014-07-08 DIAGNOSIS — Z87440 Personal history of urinary (tract) infections: Secondary | ICD-10-CM | POA: Diagnosis not present

## 2014-07-08 DIAGNOSIS — I10 Essential (primary) hypertension: Secondary | ICD-10-CM | POA: Diagnosis present

## 2014-07-08 DIAGNOSIS — Z880 Allergy status to penicillin: Secondary | ICD-10-CM | POA: Diagnosis not present

## 2014-07-08 DIAGNOSIS — D638 Anemia in other chronic diseases classified elsewhere: Secondary | ICD-10-CM | POA: Diagnosis present

## 2014-07-08 DIAGNOSIS — Z86010 Personal history of colonic polyps: Secondary | ICD-10-CM

## 2014-07-08 DIAGNOSIS — E119 Type 2 diabetes mellitus without complications: Secondary | ICD-10-CM | POA: Diagnosis present

## 2014-07-08 DIAGNOSIS — Z6830 Body mass index (BMI) 30.0-30.9, adult: Secondary | ICD-10-CM

## 2014-07-08 DIAGNOSIS — N179 Acute kidney failure, unspecified: Secondary | ICD-10-CM | POA: Diagnosis present

## 2014-07-08 DIAGNOSIS — R109 Unspecified abdominal pain: Secondary | ICD-10-CM | POA: Diagnosis not present

## 2014-07-08 DIAGNOSIS — Z90710 Acquired absence of both cervix and uterus: Secondary | ICD-10-CM | POA: Diagnosis not present

## 2014-07-08 LAB — BASIC METABOLIC PANEL
Anion gap: 10 (ref 5–15)
BUN: 29 mg/dL — ABNORMAL HIGH (ref 6–23)
CO2: 20 mmol/L (ref 19–32)
Calcium: 9.5 mg/dL (ref 8.4–10.5)
Chloride: 107 mmol/L (ref 96–112)
Creatinine, Ser: 1.01 mg/dL (ref 0.50–1.10)
GFR calc Af Amer: 64 mL/min — ABNORMAL LOW (ref >90)
GFR calc non Af Amer: 55 mL/min — ABNORMAL LOW (ref >90)
Glucose, Bld: 201 mg/dL — ABNORMAL HIGH (ref 70–99)
Potassium: 3.7 mmol/L (ref 3.5–5.1)
Sodium: 137 mmol/L (ref 135–145)

## 2014-07-08 LAB — URINALYSIS, ROUTINE W REFLEX MICROSCOPIC
Bilirubin Urine: NEGATIVE
Glucose, UA: NEGATIVE mg/dL
Ketones, ur: NEGATIVE mg/dL
Nitrite: POSITIVE — AB
Protein, ur: 30 mg/dL — AB
Specific Gravity, Urine: 1.013 (ref 1.005–1.030)
Urobilinogen, UA: 0.2 mg/dL (ref 0.0–1.0)
pH: 8.5 — ABNORMAL HIGH (ref 5.0–8.0)

## 2014-07-08 LAB — URINE MICROSCOPIC-ADD ON

## 2014-07-08 LAB — CBC WITH DIFFERENTIAL/PLATELET
Basophils Absolute: 0 10*3/uL (ref 0.0–0.1)
Basophils Relative: 0 % (ref 0–1)
Eosinophils Absolute: 0 10*3/uL (ref 0.0–0.7)
Eosinophils Relative: 0 % (ref 0–5)
HCT: 35.2 % — ABNORMAL LOW (ref 36.0–46.0)
Hemoglobin: 11.5 g/dL — ABNORMAL LOW (ref 12.0–15.0)
Lymphocytes Relative: 8 % — ABNORMAL LOW (ref 12–46)
Lymphs Abs: 1.4 10*3/uL (ref 0.7–4.0)
MCH: 29.6 pg (ref 26.0–34.0)
MCHC: 32.7 g/dL (ref 30.0–36.0)
MCV: 90.5 fL (ref 78.0–100.0)
Monocytes Absolute: 1.4 10*3/uL — ABNORMAL HIGH (ref 0.1–1.0)
Monocytes Relative: 8 % (ref 3–12)
Neutro Abs: 15.3 10*3/uL — ABNORMAL HIGH (ref 1.7–7.7)
Neutrophils Relative %: 84 % — ABNORMAL HIGH (ref 43–77)
Platelets: 304 10*3/uL (ref 150–400)
RBC: 3.89 MIL/uL (ref 3.87–5.11)
RDW: 14.1 % (ref 11.5–15.5)
WBC: 18 10*3/uL — ABNORMAL HIGH (ref 4.0–10.5)

## 2014-07-08 LAB — GLUCOSE, CAPILLARY: Glucose-Capillary: 171 mg/dL — ABNORMAL HIGH (ref 70–99)

## 2014-07-08 MED ORDER — ONDANSETRON HCL 4 MG/2ML IJ SOLN
4.0000 mg | Freq: Once | INTRAMUSCULAR | Status: AC
  Administered 2014-07-08: 4 mg via INTRAVENOUS
  Filled 2014-07-08: qty 2

## 2014-07-08 MED ORDER — ACETAMINOPHEN 325 MG PO TABS: 650.0000 mg | ORAL_TABLET | Freq: Four times a day (QID) | ORAL | Status: DC | PRN

## 2014-07-08 MED ORDER — ONDANSETRON HCL 4 MG PO TABS: 4.0000 mg | ORAL_TABLET | Freq: Four times a day (QID) | ORAL | Status: DC | PRN

## 2014-07-08 MED ORDER — DOCUSATE SODIUM 100 MG PO CAPS
100.0000 mg | ORAL_CAPSULE | Freq: Two times a day (BID) | ORAL | Status: DC
  Administered 2014-07-08 – 2014-07-12 (×8): 100 mg via ORAL
  Filled 2014-07-08 (×10): qty 1

## 2014-07-08 MED ORDER — DEXTROSE 5 % IV SOLN
1.0000 g | Freq: Every day | INTRAVENOUS | Status: DC
  Administered 2014-07-08: 1 g via INTRAVENOUS
  Filled 2014-07-08: qty 10

## 2014-07-08 MED ORDER — SODIUM CHLORIDE 0.9 % IV SOLN
INTRAVENOUS | Status: DC
  Administered 2014-07-08 – 2014-07-09 (×2): via INTRAVENOUS

## 2014-07-08 MED ORDER — ONDANSETRON HCL 4 MG/2ML IJ SOLN: 4.0000 mg | Freq: Four times a day (QID) | INTRAMUSCULAR | Status: DC | PRN

## 2014-07-08 MED ORDER — ACETAMINOPHEN 650 MG RE SUPP: 650.0000 mg | Freq: Four times a day (QID) | RECTAL | Status: DC | PRN

## 2014-07-08 MED ORDER — INSULIN ASPART 100 UNIT/ML ~~LOC~~ SOLN
0.0000 [IU] | Freq: Three times a day (TID) | SUBCUTANEOUS | Status: DC
  Administered 2014-07-09 (×2): 1 [IU] via SUBCUTANEOUS
  Administered 2014-07-09: 2 [IU] via SUBCUTANEOUS
  Administered 2014-07-10 – 2014-07-12 (×8): 1 [IU] via SUBCUTANEOUS

## 2014-07-08 MED ORDER — FENTANYL CITRATE 0.05 MG/ML IJ SOLN: 50.0000 ug | INTRAMUSCULAR | Status: DC | PRN

## 2014-07-08 MED ORDER — POLYETHYLENE GLYCOL 3350 17 G PO PACK
17.0000 g | PACK | Freq: Every day | ORAL | Status: DC
  Administered 2014-07-09 – 2014-07-12 (×4): 17 g via ORAL
  Filled 2014-07-08 (×4): qty 1

## 2014-07-08 MED ORDER — LORATADINE 10 MG PO TABS
10.0000 mg | ORAL_TABLET | Freq: Every day | ORAL | Status: DC
  Administered 2014-07-08 – 2014-07-12 (×5): 10 mg via ORAL
  Filled 2014-07-08 (×5): qty 1

## 2014-07-08 MED ORDER — HYDROCODONE-ACETAMINOPHEN 5-325 MG PO TABS
1.0000 | ORAL_TABLET | ORAL | Status: DC | PRN
  Administered 2014-07-08 – 2014-07-11 (×8): 2 via ORAL
  Filled 2014-07-08 (×8): qty 2

## 2014-07-08 MED ORDER — FENTANYL CITRATE 0.05 MG/ML IJ SOLN
50.0000 ug | Freq: Once | INTRAMUSCULAR | Status: AC
  Administered 2014-07-08: 50 ug via INTRAVENOUS
  Filled 2014-07-08: qty 2

## 2014-07-08 MED ORDER — LOSARTAN POTASSIUM 50 MG PO TABS
100.0000 mg | ORAL_TABLET | Freq: Every day | ORAL | Status: DC
  Administered 2014-07-08 – 2014-07-09 (×2): 100 mg via ORAL
  Filled 2014-07-08 (×3): qty 2

## 2014-07-08 MED ORDER — SODIUM CHLORIDE 0.9 % IV SOLN
INTRAVENOUS | Status: DC
  Administered 2014-07-08: 19:00:00 via INTRAVENOUS

## 2014-07-08 MED ORDER — SODIUM CHLORIDE 0.9 % IJ SOLN
3.0000 mL | Freq: Two times a day (BID) | INTRAMUSCULAR | Status: DC
  Administered 2014-07-08 – 2014-07-12 (×5): 3 mL via INTRAVENOUS

## 2014-07-08 MED ORDER — SIMVASTATIN 40 MG PO TABS
40.0000 mg | ORAL_TABLET | Freq: Every day | ORAL | Status: DC
  Administered 2014-07-09 – 2014-07-11 (×3): 40 mg via ORAL
  Filled 2014-07-08 (×4): qty 1

## 2014-07-08 MED ORDER — ASPIRIN EC 81 MG PO TBEC
81.0000 mg | DELAYED_RELEASE_TABLET | Freq: Every day | ORAL | Status: DC
  Administered 2014-07-08 – 2014-07-12 (×5): 81 mg via ORAL
  Filled 2014-07-08 (×5): qty 1

## 2014-07-08 MED ORDER — ENOXAPARIN SODIUM 40 MG/0.4ML ~~LOC~~ SOLN
40.0000 mg | SUBCUTANEOUS | Status: DC
  Administered 2014-07-08 – 2014-07-11 (×4): 40 mg via SUBCUTANEOUS
  Filled 2014-07-08 (×5): qty 0.4

## 2014-07-08 MED ORDER — INSULIN ASPART 100 UNIT/ML ~~LOC~~ SOLN: 0.0000 [IU] | Freq: Every day | SUBCUTANEOUS | Status: DC

## 2014-07-08 NOTE — ED Notes (Signed)
Bed: AJ58 Expected date: 07/08/14 Expected time: 6:18 PM Means of arrival: Ambulance Comments: Flank pain

## 2014-07-08 NOTE — ED Provider Notes (Signed)
CSN: [id number]     Arrival date & time 07/08/14  1819 History   First MD Initiated Contact with Patient 07/08/14 1841     Chief Complaint  Patient presents with  . Flank Pain     (Consider location/radiation/quality/duration/timing/severity/associated sxs/prior Treatment) HPI   69 year old female with right flank pain. Onset about 3 days ago. Progressively worsening. Patient has a past history of neurogenic bladder status post cystectomy with an ileal conduit. She is followed by Dr. Diona Fanti, urology. Reports that her urine has been much darker in color and has a stronger smell over the past 2 days. Fever to 101 at home. Mild nausea. No vomiting.    Past Medical History  Diagnosis Date  . HYPERLIPIDEMIA 11/29/2009  . HYPERTENSION 11/29/2009  . COLONIC POLYPS 07/11/2010  . CEREBRAL PALSY 07/11/2010    PT CAN TRANSFER BED TO CHAIR--CAN STAND BRIEFLY--BUT NOT ABLE TO WALK; RT LEG HYPEREXTENDS BACKWARD AND IS WEAK-HAS A POWER CHAIR  . Diabetes mellitus     ORAL MED FOR DIABETES  . Blood transfusion 1992    AFTER  SURGERY TO REMOVE BLADDER  . GERD (gastroesophageal reflux disease)     PAST HISTORY GERD--WATCHES DIET - NOT ON ANY MEDS FOR GERD  . Incisional hernia     NO PAIN-NOT CAUSING ANY DISCOMFORT  . Arthritis     OA  . PONV (postoperative nausea and vomiting)    Past Surgical History  Procedure Laterality Date  . Appendectomy    . Cholecystectomy    . Tonsillectomy    . Revision urostomy cutaneous  1992  . Abdominal hysterectomy  1990    partial  . Radical cystectomy  1992    neurogenic bladder sec CP  . Rt ankle fusion  1975    SEVERAL RT ANKLE SURGERIES PRIOR TO THE FUSION  . Left carpal tunnel release x 2    . Right carpal tunnel release    . Lumbar sympathetectomies x 2    . 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 Nephrolithotomy  10/01/2011    Procedure: NEPHROLITHOTOMY PERCUTANEOUS;  Surgeon: Franchot Gallo, MD;  Location: [facility];  Service:  Urology;  Laterality: Right;        Family History  Problem Relation Age of Onset  . Heart disease Mother 72    COPD  . Heart disease Father     COPD   . Emphysema Father   . Cancer Father     bladder, colon   History  Substance Use Topics  . Smoking status: Never Smoker   . Smokeless tobacco: Never Used  . Alcohol Use: No   OB History    No data available     Review of Systems  All systems reviewed and negative, other than as noted in HPI.   Allergies  Morphine; Baclofen; Lisinopril; Metoclopramide hcl; and Penicillins  Home Medications   Prior to Admission medications   Medication Sig Start Date End Date Taking? Authorizing Provider  ACCU-CHEK FASTCLIX LANCETS MISC Test twice daily 10/05/13  Yes [name], MD  acetaminophen (TYLENOL) 500 MG tablet Take 500 mg by mouth every 6 (six) hours as needed for mild pain. Pain    Yes Historical Provider, MD  aspirin 81 MG tablet Take 81 mg by mouth daily.    Yes Historical Provider, MD  b complex vitamins capsule Take 1 capsule by mouth daily.    Yes Historical Provider, MD  cetirizine (ZYRTEC) 10 MG tablet Take  10 mg by mouth daily as needed. allergies   Yes Historical Provider, MD  Cholecalciferol (VITAMIN D) 2000 UNITS CAPS Take 2,000 Units by mouth See admin instructions. Takes only on days Monday through thursday   Yes Historical Provider, MD  furosemide (LASIX) 80 MG tablet TAKE ONE TABLET BY MOUTH TWICE DAILY  Patient taking differently: TAKE ONE TABLET BY MOUTH DAILY 06/14/14  Yes [name], MD  glucose blood (ACCU-CHEK SMARTVIEW) test strip Test twice daily E11.9 05/18/14  Yes [name], MD  losartan (COZAAR) 100 MG tablet Take one tablet by mouth one time daily 07/13/13  Yes [name], MD  metFORMIN (GLUCOPHAGE) 500 MG tablet Take 2 tablets by mouth in the morning and 2 tablets every evening. 06/29/14  Yes [name], MD  Multiple Vitamin (MULTIVITAMIN) capsule Take 1 capsule by  mouth daily.    Yes Historical Provider, MD  Omega-3 Fatty Acids (FISH OIL) 1200 MG CAPS Take 1,200 mg by mouth 2 (two) times daily.    Yes Historical Provider, MD  polyethylene glycol powder (GLYCOLAX/MIRALAX) powder Take 17 g by mouth daily. One capfull daily by mouth with liquid   Yes Historical Provider, MD  simvastatin (ZOCOR) 40 MG tablet TAKE ONE TABLET BY MOUTH ONE TIME DAILY 03/22/14  Yes [name], MD  sodium chloride (OCEAN) 0.65 % nasal spray Place 1-2 sprays into the nose as needed. Allergies    Yes Historical Provider, MD   BP 135/44 mmHg  Pulse 100  Temp(Src) 99.8 F (37.7 C) (Oral)  Resp 16  SpO2 94% Physical Exam  Constitutional: She appears well-developed and well-nourished. No distress.  HENT:  Head: Normocephalic and atraumatic.  Eyes: Conjunctivae are normal. Right eye exhibits no discharge. Left eye exhibits no discharge.  Neck: Neck supple.  Cardiovascular: Regular rhythm and normal heart sounds.  Exam reveals no gallop and no friction rub.   No murmur heard. Mild tachycardia  Pulmonary/Chest: Effort normal and breath sounds normal. No respiratory distress.  Abdominal: Soft. She exhibits no distension. There is no tenderness.  Urostomy with healthy appearing stoma. Bag currently empty. No abdominal tenderness. R CVA tenderness.   Musculoskeletal: She exhibits no edema or tenderness.  Neurological: She is alert.  Skin: Skin is warm and dry.  Psychiatric: She has a normal mood and affect. Her behavior is normal. Thought content normal.  Nursing note and vitals reviewed.   ED Course  Procedures (including critical care time) Labs Review Labs Reviewed  CBC WITH DIFFERENTIAL/PLATELET - Abnormal; Notable for the following:    WBC 18.0 (*)    Hemoglobin 11.5 (*)    HCT 35.2 (*)    Neutrophils Relative % 84 (*)    Neutro Abs 15.3 (*)    Lymphocytes Relative 8 (*)    Monocytes Absolute 1.4 (*)    All other components within normal limits  BASIC  METABOLIC PANEL - Abnormal; Notable for the following:    Glucose, Bld 201 (*)    BUN 29 (*)    GFR calc non Af Amer 55 (*)    GFR calc Af Amer 64 (*)    All other components within normal limits  URINALYSIS, ROUTINE W REFLEX MICROSCOPIC - Abnormal; Notable for the following:    APPearance CLOUDY (*)    pH 8.5 (*)    Hgb urine dipstick TRACE (*)    Protein, ur 30 (*)    Nitrite POSITIVE (*)    Leukocytes, UA LARGE (*)  All other components within normal limits  URINE MICROSCOPIC-ADD ON - Abnormal; Notable for the following:    Bacteria, UA MANY (*)    Casts HYALINE CASTS (*)    All other components within normal limits  CULTURE, BLOOD (ROUTINE X 2)  CULTURE, BLOOD (ROUTINE X 2)    Imaging Review No results found.   EKG Interpretation None      MDM   Final diagnoses:  Pyelonephritis    68yF with R flank pain. Clinically concerning for pyelonephritis. Reports fever at home. Leukocytosis. UA consistent with infection. Hx of Neurogenic Bladder / Urinary Diversion - s/p ileal conduit 1992 for neurogenic bladder. PCN allergy. Cipro. Admission.     Virgel Manifold, MD 07/12/14 [phone]

## 2014-07-08 NOTE — H&P (Addendum)
PCP: [name], MD  [facility]  Chief Complaint:  Right flank pain  HPI: Jordan [name] is a 69 y.o. female   has a past medical history of HYPERLIPIDEMIA (11/29/2009); HYPERTENSION (11/29/2009); COLONIC POLYPS (07/11/2010); CEREBRAL PALSY (07/11/2010); Diabetes mellitus; Blood transfusion (1992); GERD (gastroesophageal reflux disease); Incisional hernia; Arthritis; and PONV (postoperative nausea and vomiting).   Presented with  2 day hx of fever, chills, nausea, poor PO intake and right flank pain. Patient has hx of of Cerebral palsy sp ileo conduit with urostomy  for hx of recurrent UTI 1992. Patient has noted increased smell from urine. She reports mild non productive cough for the past 3 weeks this has been worked up in the past by a chest x-ray which was unremarkable. NO chest pain.   Hospitalist was called for admission for Pyelonephritis.   Review of Systems:    Pertinent positives include: Fevers, chills, fatigue, nausea,   Constitutional:  No weight loss, night sweats,  weight loss  HEENT:  No headaches, Difficulty swallowing,Tooth/dental problems,Sore throat,  No sneezing, itching, ear ache, nasal congestion, post nasal drip,  Cardio-vascular:  No chest pain, Orthopnea, PND, anasarca, dizziness, palpitations.no Bilateral lower extremity swelling  GI:  No heartburn, indigestion, abdominal pain,vomiting, diarrhea, change in bowel habits, loss of appetite, melena, blood in stool, hematemesis Resp:  no shortness of breath at rest. No dyspnea on exertion, No excess mucus, no productive cough, No non-productive cough, No coughing up of blood.No change in color of mucus.No wheezing. Skin:  no rash or lesions. No jaundice GU:  no dysuria, change in color of urine, no urgency or frequency. No straining to urinate.  No flank pain.  Musculoskeletal:  No joint pain or no joint swelling. No decreased range of motion. No back pain.  Psych:  No change in mood or affect.  No depression or anxiety. No memory loss.  Neuro: no localizing neurological complaints, no tingling, no weakness, no double vision, no gait abnormality, no slurred speech, no confusion  Otherwise ROS are negative except for above, 10 systems were reviewed  Past Medical History: Past Medical History  Diagnosis Date  . HYPERLIPIDEMIA 11/29/2009  . HYPERTENSION 11/29/2009  . COLONIC POLYPS 07/11/2010  . CEREBRAL PALSY 07/11/2010    PT CAN TRANSFER BED TO CHAIR--CAN STAND BRIEFLY--BUT NOT ABLE TO WALK; RT LEG HYPEREXTENDS BACKWARD AND IS WEAK-HAS A POWER CHAIR  . Diabetes mellitus     ORAL MED FOR DIABETES  . Blood transfusion 1992    AFTER  SURGERY TO REMOVE BLADDER  . GERD (gastroesophageal reflux disease)     PAST HISTORY GERD--WATCHES DIET - NOT ON ANY MEDS FOR GERD  . Incisional hernia     NO PAIN-NOT CAUSING ANY DISCOMFORT  . Arthritis     OA  . PONV (postoperative nausea and vomiting)    Past Surgical History  Procedure Laterality Date  . Appendectomy    . Cholecystectomy    . Tonsillectomy    . Revision urostomy cutaneous  1992  . Abdominal hysterectomy  1990    partial  . Radical cystectomy  1992    neurogenic bladder sec CP  . Rt ankle fusion  1975    SEVERAL RT ANKLE SURGERIES PRIOR TO THE FUSION  . Left carpal tunnel release x 2    . Right carpal tunnel release    . Lumbar sympathetectomies x 2    . 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 Nephrolithotomy  10/01/2011  Procedure: NEPHROLITHOTOMY PERCUTANEOUS;  Surgeon: Franchot Gallo, MD;  Location: [facility];  Service: Urology;  Laterality: Right;          Medications: Prior to Admission medications   Medication Sig Start Date End Date Taking? Authorizing Provider  ACCU-CHEK FASTCLIX LANCETS MISC Test twice daily 10/05/13  Yes [name], MD  acetaminophen (TYLENOL) 500 MG tablet Take 500 mg by mouth every 6 (six) hours as needed for mild pain. Pain    Yes Historical Provider, MD  aspirin  81 MG tablet Take 81 mg by mouth daily.    Yes Historical Provider, MD  b complex vitamins capsule Take 1 capsule by mouth daily.    Yes Historical Provider, MD  cetirizine (ZYRTEC) 10 MG tablet Take 10 mg by mouth daily as needed. allergies   Yes Historical Provider, MD  Cholecalciferol (VITAMIN D) 2000 UNITS CAPS Take 2,000 Units by mouth See admin instructions. Takes only on days Monday through thursday   Yes Historical Provider, MD  furosemide (LASIX) 80 MG tablet TAKE ONE TABLET BY MOUTH TWICE DAILY  Patient taking differently: TAKE ONE TABLET BY MOUTH DAILY 06/14/14  Yes [name], MD  glucose blood (ACCU-CHEK SMARTVIEW) test strip Test twice daily E11.9 05/18/14  Yes [name], MD  losartan (COZAAR) 100 MG tablet Take one tablet by mouth one time daily 07/13/13  Yes [name], MD  metFORMIN (GLUCOPHAGE) 500 MG tablet Take 2 tablets by mouth in the morning and 2 tablets every evening. 06/29/14  Yes [name], MD  Multiple Vitamin (MULTIVITAMIN) capsule Take 1 capsule by mouth daily.    Yes Historical Provider, MD  Omega-3 Fatty Acids (FISH OIL) 1200 MG CAPS Take 1,200 mg by mouth 2 (two) times daily.    Yes Historical Provider, MD  polyethylene glycol powder (GLYCOLAX/MIRALAX) powder Take 17 g by mouth daily. One capfull daily by mouth with liquid   Yes Historical Provider, MD  simvastatin (ZOCOR) 40 MG tablet TAKE ONE TABLET BY MOUTH ONE TIME DAILY 03/22/14  Yes [name], MD  sodium chloride (OCEAN) 0.65 % nasal spray Place 1-2 sprays into the nose as needed. Allergies    Yes Historical Provider, MD    Allergies:   Allergies  Allergen Reactions  . Morphine Anaphylaxis  . Baclofen     GI upset  . Lisinopril Cough  . Metoclopramide Hcl     Reglan, irritable  . Penicillins     " MAJOR CASE OF HIVES"    Social History:  Ambulatory   wheelchair bound since 1985,   Lives at home  With family     reports that she has never smoked. She has never  used smokeless tobacco. She reports that she does not drink alcohol or use illicit drugs.    Family History: family history includes Cancer in her father; Emphysema in her father; Heart disease in her father; Heart disease (age of onset: 83) in her mother.    Physical Exam: Patient Vitals for the past 24 hrs:  BP Temp Temp src Pulse Resp SpO2  07/08/14 1822 (!) 135/44 mmHg 99.8 F (37.7 C) Oral 100 16 94 %    1. General:  in No Acute distress 2. Psychological: Alert and   Oriented 3. Head/ENT:    Dry Mucous Membranes                          Head Non traumatic, neck supple  Normal  Dentition 4. SKIN:  decreased Skin turgor,  Skin clean Dry and intact no rash 5. Heart: Regular rate and rhythm no Murmur, Rub or gallop 6. Lungs: Clear to auscultation bilaterally, no wheezes or crackles   7. Abdomen: Soft, non-tender, Non distended, slightly obese, urostomy pouch present 8. Lower extremities: no clubbing, cyanosis, or edema 9. Neurologically Grossly intact, moving all 4 extremities equally 10. MSK: Normal range of motion  body mass index is unknown because there is no weight on file.   Labs on Admission:   Results for orders placed or performed during the hospital encounter of 07/08/14 (from the past 24 hour(s))  CBC with Differential     Status: Abnormal   Collection Time: 07/08/14  6:59 PM  Result Value Ref Range   WBC 18.0 (H) 4.0 - 10.5 K/uL   RBC 3.89 3.87 - 5.11 MIL/uL   Hemoglobin 11.5 (L) 12.0 - 15.0 g/dL   HCT 35.2 (L) 36.0 - 46.0 %   MCV 90.5 78.0 - 100.0 fL   MCH 29.6 26.0 - 34.0 pg   MCHC 32.7 30.0 - 36.0 g/dL   RDW 14.1 11.5 - 15.5 %   Platelets 304 150 - 400 K/uL   Neutrophils Relative % 84 (H) 43 - 77 %   Neutro Abs 15.3 (H) 1.7 - 7.7 K/uL   Lymphocytes Relative 8 (L) 12 - 46 %   Lymphs Abs 1.4 0.7 - 4.0 K/uL   Monocytes Relative 8 3 - 12 %   Monocytes Absolute 1.4 (H) 0.1 - 1.0 K/uL   Eosinophils Relative 0 0 - 5 %   Eosinophils  Absolute 0.0 0.0 - 0.7 K/uL   Basophils Relative 0 0 - 1 %   Basophils Absolute 0.0 0.0 - 0.1 K/uL  Basic metabolic panel     Status: Abnormal   Collection Time: 07/08/14  6:59 PM  Result Value Ref Range   Sodium 137 135 - 145 mmol/L   Potassium 3.7 3.5 - 5.1 mmol/L   Chloride 107 96 - 112 mmol/L   CO2 20 19 - 32 mmol/L   Glucose, Bld 201 (H) 70 - 99 mg/dL   BUN 29 (H) 6 - 23 mg/dL   Creatinine, Ser 1.01 0.50 - 1.10 mg/dL   Calcium 9.5 8.4 - 10.5 mg/dL   GFR calc non Af Amer 55 (L) >90 mL/min   GFR calc Af Amer 64 (L) >90 mL/min   Anion gap 10 5 - 15  Urinalysis, Routine w reflex microscopic     Status: Abnormal   Collection Time: 07/08/14  7:05 PM  Result Value Ref Range   Color, Urine YELLOW YELLOW   APPearance CLOUDY (A) CLEAR   Specific Gravity, Urine 1.013 1.005 - 1.030   pH 8.5 (H) 5.0 - 8.0   Glucose, UA NEGATIVE NEGATIVE mg/dL   Hgb urine dipstick TRACE (A) NEGATIVE   Bilirubin Urine NEGATIVE NEGATIVE   Ketones, ur NEGATIVE NEGATIVE mg/dL   Protein, ur 30 (A) NEGATIVE mg/dL   Urobilinogen, UA 0.2 0.0 - 1.0 mg/dL   Nitrite POSITIVE (A) NEGATIVE   Leukocytes, UA LARGE (A) NEGATIVE  Urine microscopic-add on     Status: Abnormal   Collection Time: 07/08/14  7:05 PM  Result Value Ref Range   Squamous Epithelial / LPF RARE RARE   WBC, UA 21-50 <3 WBC/hpf   RBC / HPF 0-2 <3 RBC/hpf   Bacteria, UA MANY (A) RARE   Casts HYALINE CASTS (A) NEGATIVE  UA WBC 21-50 evidence of UTI  Lab Results  Component Value Date   HGBA1C 7.8* 06/15/2014    CrCl cannot be calculated (Unknown ideal weight.).  BNP (last 3 results) No results for input(s): PROBNP in the last 8760 hours.  Other results:  I have pearsonaly reviewed this: ECG not obtained   There were no vitals filed for this visit.   Cultures:    Component Value Date/Time   SDES URINE, RANDOM 11/29/2011 2138   SPECREQUEST Normal 11/29/2011 2138   CULT PROTEUS MIRABILIS 11/29/2011 2138   REPTSTATUS 12/03/2011  FINAL 11/29/2011 2138     Radiological Exams on Admission: No results found.  Chart has been reviewed  Assessment/Plan  69 year old female with history of cerebral palsy status post radical cystectomy and ileal conduit and urostomy placement. Presents with right flank pain infected urine consistent with pyelonephritis  Present on Admission:  . Sepsis secondary to UTI - given fevers, tachycardia respiratory rate above 22 and WBC 18 patient fulfills criteria for sepsis. Blood cultures obtained give gentle IV fluids hold Lasix give IV antibiotics patient had been given Keflex in the past and tolerated well await results of urine culture  . Pyelonephritis - treat with Keflex await results of urine blood cultures. If no rapid resolution father imaging could be obtained to evaluate for pyelonephritis complications   . Infantile cerebral palsy - chronic stable . Essential hypertension - cont home meds DM 2 - SSi, hold metformin in case needs further imaging   Prophylaxis:   Lovenox,   CODE STATUS:  FULL CODE    Other plan as per orders.  I have spent a total of 55 min on this admission  Kjuan Seipp 07/08/2014, 8:41 PM  [facility]  Pager [phone]   after 2 AM please page floor coverage PA If 7AM-7PM, please contact the day team taking care of the patient  [email or web address]  Password [email or web address]

## 2014-07-08 NOTE — ED Notes (Signed)
She c/o right flank pain and felt like "fever sometimes" x 3 days.  She cites hx of illeal conduit; for the maintenance of which she sees Dr. Diona Fanti.  She states that normally she has Septra tablets that she has been instructed to take prn if she feels signs of u.t.i., "but I ran out of them".  She is awake, alert and oriented x 4 with clear speech.

## 2014-07-09 LAB — HEMOGLOBIN A1C
HEMOGLOBIN A1C: 7 % — AB (ref <5.7)
MEAN PLASMA GLUCOSE: 154 mg/dL — AB (ref <117)

## 2014-07-09 LAB — COMPREHENSIVE METABOLIC PANEL
ALBUMIN: 3.2 g/dL — AB (ref 3.5–5.2)
ALT: 25 U/L (ref 0–35)
ANION GAP: 11 (ref 5–15)
AST: 18 U/L (ref 0–37)
Alkaline Phosphatase: 70 U/L (ref 39–117)
BUN: 26 mg/dL — ABNORMAL HIGH (ref 6–23)
CO2: 22 mmol/L (ref 19–32)
Calcium: 9.2 mg/dL (ref 8.4–10.5)
Chloride: 107 mmol/L (ref 96–112)
Creatinine, Ser: 1.16 mg/dL — ABNORMAL HIGH (ref 0.50–1.10)
GFR, EST AFRICAN AMERICAN: 54 mL/min — AB (ref >90)
GFR, EST NON AFRICAN AMERICAN: 47 mL/min — AB (ref >90)
Glucose, Bld: 180 mg/dL — ABNORMAL HIGH (ref 70–99)
Potassium: 4.5 mmol/L (ref 3.5–5.1)
Sodium: 140 mmol/L (ref 135–145)
TOTAL PROTEIN: 6.8 g/dL (ref 6.0–8.3)
Total Bilirubin: 0.6 mg/dL (ref 0.3–1.2)

## 2014-07-09 LAB — CBC
HEMATOCRIT: 35.3 % — AB (ref 36.0–46.0)
Hemoglobin: 11.5 g/dL — ABNORMAL LOW (ref 12.0–15.0)
MCH: 29.7 pg (ref 26.0–34.0)
MCHC: 32.6 g/dL (ref 30.0–36.0)
MCV: 91.2 fL (ref 78.0–100.0)
PLATELETS: 291 10*3/uL (ref 150–400)
RBC: 3.87 MIL/uL (ref 3.87–5.11)
RDW: 14.5 % (ref 11.5–15.5)
WBC: 20.2 10*3/uL — AB (ref 4.0–10.5)

## 2014-07-09 LAB — GLUCOSE, CAPILLARY
GLUCOSE-CAPILLARY: 134 mg/dL — AB (ref 70–99)
Glucose-Capillary: 143 mg/dL — ABNORMAL HIGH (ref 70–99)
Glucose-Capillary: 162 mg/dL — ABNORMAL HIGH (ref 70–99)

## 2014-07-09 LAB — MAGNESIUM: MAGNESIUM: 1.5 mg/dL (ref 1.5–2.5)

## 2014-07-09 LAB — TSH: TSH: 0.74 u[IU]/mL (ref 0.350–4.500)

## 2014-07-09 LAB — PHOSPHORUS: Phosphorus: 3.6 mg/dL (ref 2.3–4.6)

## 2014-07-09 MED ORDER — DEXTROSE 5 % IV SOLN
1.0000 g | INTRAVENOUS | Status: DC
  Administered 2014-07-09 – 2014-07-11 (×3): 1 g via INTRAVENOUS
  Filled 2014-07-09 (×4): qty 10

## 2014-07-09 NOTE — Progress Notes (Signed)
Patient ID: Jordan [name], female   DOB: 1946/02/08, 69 y.o.   MRN: [id number]  [facility] PROGRESS NOTE  Jordan [name] EZM:[id number] DOB: 1945/08/25 DOA: 07/08/2014 PCP: Jordan Post, MD   Brief narrative:    69 y.o. female with HTN, HLD, cerebral palsy, s/p ileal conduit with urostomy for recurrent UTI's (1992), DM, presented to [facility] ED with main concern of 2 days duration of fevers, chills, malaise, nausea, poor oral intake. This has been associated with low abd quadrants pain, throbbing and constant, 7/10 in severity, occasionally but now consistently radiating to mid back area bilaterally. Pt denies chest pain or shortness of breath. [facility] asked to admit for further evaluation and management of presumptive pyelonephritis.   Assessment/Plan:    Active Problems:   Sepsis secondary to UTI, Pyelonephritis - sepsis criteria met on admission with fever, tachycardia, RR>22, WBC 18K, source urinary based on UT - pt appears clinically stable this AM, reports feeling better - continue Rocephin day #2, follow up on blood and urine cultures - provide IVF, analgesia as needed    Essential hypertension - reasonable inpatient control  - continue home regimen with Losartan    Acute renal failure - appears to be pre renal - continue IVF and repeat BMP in AM - may need to hold Losartan if Cr continues trending up    Infantile cerebral palsy   Type 2 diabetes mellitus - taking Metformin at home, now on hold while inpatient  - place on SSI instead until oral intake improves  - will be able to d/c on Metformin per previous regimen - A1C pending    HLD - continue statin    DVT prophylaxis: Lovenox SQ   Obesity, Body mass index is 30  Code Status: Full.  Family Communication:  plan of care discussed with the patient Disposition Plan: Home when stable.   IV access:  Peripheral IV Procedures and diagnostic studies:    CXR  06/29/2014    No active cardiopulmonary disease. Previously  identified right base infiltrate on chest x-ray 11/29/2011 has cleared.    Medical Consultants:  None  Other Consultants:  None IAnti-Infectives:   Rocephin 1/23 -->  Jordan Ramsay, MD  [facility] Pager [phone]  If 7PM-7AM, please contact night-coverage [email or web address] Password [email or web address] 07/09/2014, 12:43 PM   LOS: 1 day   HPI/Subjective: No events overnight.   Objective: Filed Vitals:   07/08/14 2100 07/08/14 2115 07/09/14 0528 07/09/14 0940  BP: 128/60 123/46 146/50   Pulse: 103 106 93   Temp:  99.4 F (37.4 C) 99.2 F (37.3 C)   TempSrc:  Oral Oral   Resp:  22 18   Height:  '5\' 5"'  (1.651 m)    Weight:    81.6 kg (179 lb 14.3 oz)  SpO2: 97% 98% 100%     Intake/Output Summary (Last 24 hours) at 07/09/14 1243 Last data filed at 07/09/14 0529  Gross per 24 hour  Intake     50 ml  Output    950 ml  Net   -900 ml    Exam:   General:  Pt is alert, follows commands appropriately, not in acute distress  Cardiovascular: Regular rate and rhythm, no rubs, no gallops  Respiratory: Clear to auscultation bilaterally, no wheezing, no crackles, no rhonchi  Abdomen: Soft, tender in lower abd quadrants, non distended, bowel sounds present, no guarding  Extremities: pulses DP and PT palpable bilaterally  Neuro: Grossly nonfocal  Data Reviewed: Basic Metabolic Panel:  Recent Labs Lab 07/08/14  1859 07/09/14 0555  NA 137 140  K 3.7 4.5  CL 107 107  CO2 20 22  GLUCOSE 201* 180*  BUN 29* 26*  CREATININE 1.01 1.16*  CALCIUM 9.5 9.2  MG  --  1.5  PHOS  --  3.6   Liver Function Tests:  Recent Labs Lab 07/09/14 0555  AST 18  ALT 25  ALKPHOS 70  BILITOT 0.6  PROT 6.8  ALBUMIN 3.2*   CBC:  Recent Labs Lab 07/08/14 1859 07/09/14 0555  WBC 18.0* 20.2*  NEUTROABS 15.3*  --   HGB 11.5* 11.5*  HCT 35.2* 35.3*  MCV 90.5 91.2  PLT 304 291   CBG:  Recent Labs Lab 07/08/14 2227 07/09/14 0740 07/09/14 1148  GLUCAP 171* 143* 162*   Scheduled Meds: .  aspirin EC  81 mg Oral Daily  . cefTRIAXone   1 g Intravenous QHS  . docusate sodium  100 mg Oral BID  . enoxaparin  injection  40 mg Subcutaneous Q24H  . insulin aspart  0-5 Units Subcutaneous QHS  . insulin aspart  0-9 Units Subcutaneous TID WC  . loratadine  10 mg Oral Daily  . losartan  100 mg Oral Daily  . polyethylene glycol  17 g Oral Daily  . simvastatin  40 mg Oral q1800   Continuous Infusions: . sodium chloride 50 mL/hr at 07/08/14 2320

## 2014-07-10 LAB — CBC
HEMATOCRIT: 32 % — AB (ref 36.0–46.0)
Hemoglobin: 9.9 g/dL — ABNORMAL LOW (ref 12.0–15.0)
MCH: 28.5 pg (ref 26.0–34.0)
MCHC: 30.9 g/dL (ref 30.0–36.0)
MCV: 92.2 fL (ref 78.0–100.0)
Platelets: 285 10*3/uL (ref 150–400)
RBC: 3.47 MIL/uL — ABNORMAL LOW (ref 3.87–5.11)
RDW: 14.4 % (ref 11.5–15.5)
WBC: 14.4 10*3/uL — ABNORMAL HIGH (ref 4.0–10.5)

## 2014-07-10 LAB — BASIC METABOLIC PANEL
Anion gap: 10 (ref 5–15)
BUN: 25 mg/dL — AB (ref 6–23)
CO2: 20 mmol/L (ref 19–32)
Calcium: 8.6 mg/dL (ref 8.4–10.5)
Chloride: 108 mmol/L (ref 96–112)
Creatinine, Ser: 1.09 mg/dL (ref 0.50–1.10)
GFR calc Af Amer: 58 mL/min — ABNORMAL LOW (ref >90)
GFR, EST NON AFRICAN AMERICAN: 50 mL/min — AB (ref >90)
GLUCOSE: 153 mg/dL — AB (ref 70–99)
POTASSIUM: 4.1 mmol/L (ref 3.5–5.1)
Sodium: 138 mmol/L (ref 135–145)

## 2014-07-10 LAB — GLUCOSE, CAPILLARY
Glucose-Capillary: 144 mg/dL — ABNORMAL HIGH (ref 70–99)
Glucose-Capillary: 132 mg/dL — ABNORMAL HIGH (ref 70–99)
Glucose-Capillary: 149 mg/dL — ABNORMAL HIGH (ref 70–99)
Glucose-Capillary: 130 mg/dL — ABNORMAL HIGH (ref 70–99)

## 2014-07-10 LAB — URINE CULTURE: Colony Count: >=100000

## 2014-07-10 MED ORDER — FUROSEMIDE 40 MG PO TABS
80.0000 mg | ORAL_TABLET | Freq: Every day | ORAL | Status: DC
  Administered 2014-07-10 – 2014-07-12 (×3): 80 mg via ORAL
  Filled 2014-07-10 (×3): qty 2

## 2014-07-10 MED ORDER — VITAMINS A & D EX OINT
TOPICAL_OINTMENT | CUTANEOUS | Status: AC
  Filled 2014-07-10: qty 5

## 2014-07-10 MED ORDER — LOSARTAN POTASSIUM 25 MG PO TABS
25.0000 mg | ORAL_TABLET | Freq: Every day | ORAL | Status: DC
  Administered 2014-07-10 – 2014-07-12 (×3): 25 mg via ORAL
  Filled 2014-07-10 (×3): qty 1

## 2014-07-10 NOTE — Progress Notes (Signed)
Patient ID: Jordan [name], female   DOB: 19-May-1946, 69 y.o.   MRN: [id number]  [facility] PROGRESS NOTE  Jordan [name] [id number]:[id number] DOB: [birth year] DOA: 07/08/2014 PCP: [name], MD  Brief narrative:    68 y.o. 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 This has been associated with low abd quadrants pain, throbbing and constant, 7/10 in severity, occasionally but now consistently radiating to mid back area bilaterally. Pt denies chest pain or shortness of breath. [facility] asked to admit for further evaluation and management of presumptive pyelonephritis.   Assessment/Plan:    Active Problems:  Sepsis secondary to UTI, Pyelonephritis - sepsis criteria met on admission with fever, tachycardia, RR>22, WBC 18K, source urinary based on UT - pt appears clinically stable this AM, reports feeling better but with persistent fevers, Tmax in the past 24 hours 100.6 F - continue Rocephin day #3, follow up on blood and urine cultures - WBC trending down  - provide IVF, analgesia as needed   Essential hypertension - reasonable inpatient control  - continue home regimen with Losartan but lowe the dose to 25 mg PO QD  Acute renal failure - appears to be pre renal - continued IVF and Cr is now WNL - will stop IVF and place on home regimen with Lasix   Infantile cerebral palsy  Type 2 diabetes mellitus - taking Metformin at home, now on hold while inpatient  - placed on SSI instead until oral intake improves  - will be able to d/c on Metformin per previous regimen - A1C pending   HLD - continue statin    Anemia of chronic disease, DM - drop in Hg likely dilutional from IVF pt has received - no sings of active bleeding - CBC in AM  DVT prophylaxis: Lovenox SQ  Obesity, Body mass index is 30  Code  Status: Full.  Family Communication: plan of care discussed with the patient Disposition Plan: Home when stable.   IV access:  Peripheral IV Procedures and diagnostic studies:   CXR 06/29/2014 No active cardiopulmonary disease. Previously identified right base infiltrate on chest x-ray 11/29/2011 has cleared.  Medical Consultants:  None  Other Consultants:  None IAnti-Infectives:   Rocephin 1/23 -->  Faye Ramsay, MD  [facility] Pager [phone]  If 7PM-7AM, please contact night-coverage [email or web address] Password [facility] 07/10/2014, 11:39 AM   LOS: 2 days   HPI/Subjective: No events overnight. Still with low grade fevers.   Objective: Filed Vitals:   07/09/14 0940 07/09/14 1406 07/09/14 2022 07/10/14 0434  BP:  121/48 142/54 137/59  Pulse:  75 84 82  Temp:  98.7 F (37.1 C) 100.6 F (38.1 C) 99.7 F (37.6 C)  TempSrc:  Oral Oral Oral  Resp:  '19 20 20  ' Height:      Weight: 81.6 kg (179 lb 14.3 oz)     SpO2:  97% 96% 96%    Intake/Output Summary (Last 24 hours) at 07/10/14 1139 Last data filed at 07/10/14 0435  Gross per 24 hour  Intake    530 ml  Output   1850 ml  Net  -1320 ml    Exam:   General:  Pt is alert, follows commands appropriately, not in acute distress  Cardiovascular: Regular rate and rhythm, no rubs, no gallops  Respiratory: Clear to auscultation bilaterally, no wheezing, no crackles, no rhonchi  Abdomen: Soft, non tender, non distended, bowel sounds present, no guarding  Extremities: pulses DP and PT palpable bilaterally  Neuro: Grossly nonfocal  Data Reviewed: Basic Metabolic Panel:  Recent Labs Lab 07/08/14 1859 07/09/14 0555 07/10/14 0417  NA 137 140 138  K 3.7 4.5 4.1  CL 107 107 108  CO2 '20 22 20  ' GLUCOSE 201* 180* 153*  BUN 29* 26* 25*  CREATININE 1.01 1.16* 1.09  CALCIUM 9.5 9.2 8.6  MG  --  1.5  --   PHOS  --  3.6  --    Liver Function Tests:  Recent Labs Lab 07/09/14 0555  AST 18  ALT 25   ALKPHOS 70  BILITOT 0.6  PROT 6.8  ALBUMIN 3.2*   CBC:  Recent Labs Lab 07/08/14 1859 07/09/14 0555 07/10/14 0417  WBC 18.0* 20.2* 14.4*  NEUTROABS 15.3*  --   --   HGB 11.5* 11.5* 9.9*  HCT 35.2* 35.3* 32.0*  MCV 90.5 91.2 92.2  PLT 304 291 285   CBG:  Recent Labs Lab 07/08/14 2227 07/09/14 0740 07/09/14 1148 07/09/14 1719 07/10/14 0737  GLUCAP 171* 143* 162* 134* 144*    Recent Results (from the past 240 hour(s))  Urine culture     Status: None   Collection Time: 07/08/14  7:05 PM  Result Value Ref Range Status   Specimen Description URINE, RANDOM  Final   Special Requests NONE  Final   Colony Count   Final    >=100,000 COLONIES/ML Performed at [facility]    Culture   Final    Multiple bacterial morphotypes present, none predominant. Suggest appropriate recollection if clinically indicated. Performed at [facility]    Report Status 07/10/2014 FINAL  Final  Blood culture (routine x 2)     Status: None (Preliminary result)   Collection Time: 07/08/14  7:30 PM  Result Value Ref Range Status   Specimen Description BLOOD RIGHT HAND  Final   Special Requests BOTTLES DRAWN AEROBIC AND ANAEROBIC 5CC EACH  Final   Culture   Final           BLOOD CULTURE RECEIVED NO GROWTH TO DATE CULTURE WILL BE HELD FOR 5 DAYS BEFORE ISSUING A FINAL NEGATIVE REPORT Performed at [facility]    Report Status PENDING  Incomplete  Blood culture (routine x 2)     Status: None (Preliminary result)   Collection Time: 07/08/14  7:31 PM  Result Value Ref Range Status   Specimen Description BLOOD RIGHT ANTECUBITAL  Final   Special Requests BOTTLES DRAWN AEROBIC AND ANAEROBIC 5CC EACH  Final   Culture   Final           BLOOD CULTURE RECEIVED NO GROWTH TO DATE CULTURE WILL BE HELD FOR 5 DAYS BEFORE ISSUING A FINAL NEGATIVE REPORT Performed at [facility]    Report Status PENDING  Incomplete     Scheduled Meds: . aspirin EC  81 mg Oral Daily  .  cefTRIAXone (ROCEPHIN) IVPB 1 gram/50 mL D5W  1 g Intravenous Q24H  . docusate sodium  100 mg Oral BID  . enoxaparin (LOVENOX) injection  40 mg Subcutaneous Q24H  . furosemide  80 mg Oral Daily  . insulin aspart  0-5 Units Subcutaneous QHS  . insulin aspart  0-9 Units Subcutaneous TID WC  . loratadine  10 mg Oral Daily  . losartan  25 mg Oral Daily  . polyethylene glycol  17 g Oral Daily  . simvastatin  40 mg Oral q1800  . sodium chloride  3 mL Intravenous Q12H   Continuous Infusions:

## 2014-07-10 NOTE — Progress Notes (Deleted)
Called to pt room by pt granddaughter. upon arrival to room pt laying in bed blowing her nose.pt had bright red blood on tissue and a small amount of blood coming out of nose. 2x2 Gauze with a small amount of  A&D ointment placed in pt nose. Pt ask to keep it in her nose to allow a clot to form. With in 5 minutes nose had stopped bleeding. Will continue to monitor.

## 2014-07-10 NOTE — Progress Notes (Signed)
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 Lengthy visit. Offered support. Helped her connect to her sources of hope and coping. Her spouse, TJ, was also in the room at the time of the visit. Ashelyn is very independent and resourceful despite her physical limitations. She has a strong [place] faith that helps her cope.    Epifania Gore, PhD, [place]

## 2014-07-10 NOTE — Evaluation (Signed)
Physical Therapy One Time Evaluation Patient Details Name: Jordan [name] MRN: [id number] DOB: 06-04-1946 Today's Date: 07/10/2014   History of Present Illness  69 y.o. 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 All education has been completed and the patient has no further questions. Pt presents at her functional baseline.  Pt has DME at home.  No follow-up Physical Therapy needs at this time. PT is signing off. Thank you for this referral.     Follow Up Recommendations No PT follow up    Equipment Recommendations  None recommended by PT    Recommendations for Other Services       Precautions / Restrictions Precautions Precautions: None      Mobility  Bed Mobility Overal bed mobility: Modified Independent             General bed mobility comments: HOB elevated  Transfers Overall transfer level: Needs assistance   Transfers: Sit to/from Stand;Stand Pivot Transfers Sit to Stand: Supervision Stand pivot transfers: Supervision       General transfer comment: angled recliner toward bed, pt able to stand pivot to recliner using armrests for UE support  Ambulation/Gait Ambulation/Gait assistance:  (non-ambulatory)              Stairs            Wheelchair Mobility    Modified Rankin (Stroke Patients Only)       Balance                                             Pertinent Vitals/Pain Pain Assessment: No/denies pain    Home Living Family/patient expects to be discharged to:: Private residence Living Arrangements: Spouse/significant other   Type of Home: House Home Access: Level entry     Home Layout: One level Home Equipment: Wheelchair - power;Grab bars - toilet;Grab bars - tub/shower;Tub bench       Prior Function Level of Independence: Independent with assistive device(s)         Comments: uses power chair for mobility, able to stand pivot, no longer ambulating due to R LE hyperextension, has service dog     Hand Dominance        Extremity/Trunk Assessment               Lower Extremity Assessment: RLE deficits/detail RLE Deficits / Details: reports hx of hip flexion contracture release surgery as well as knee hyperextension with gait so now only transfers to w/c       Communication   Communication: No difficulties  Cognition Arousal/Alertness: Awake/alert Behavior During Therapy: WFL for tasks assessed/performed Overall Cognitive Status: Within Functional Limits for tasks assessed                      General Comments      Exercises        Assessment/Plan    PT Assessment Patent does not need any further PT services  PT Diagnosis     PT Problem List    PT Treatment Interventions     PT Goals (Current goals can be found in the Care Plan section) Acute Rehab PT Goals PT Goal Formulation: All assessment  and education complete, DC therapy    Frequency     Barriers to discharge        Co-evaluation               End of Session   Activity Tolerance: Patient tolerated treatment well Patient left: in chair;with call bell/phone within reach;with family/visitor present           Time: 6168-3729 PT Time Calculation (min) (ACUTE ONLY): 13 min   Charges:   PT Evaluation $Initial PT Evaluation Tier I: 1 Procedure     PT G Codes:        Kadia Abaya,KATHrine E 07/10/2014, 4:08 PM Carmelia Bake, PT, DPT 07/10/2014 Pager: [phone]

## 2014-07-11 LAB — CBC
HCT: 30.5 % — ABNORMAL LOW (ref 36.0–46.0)
Hemoglobin: 9.5 g/dL — ABNORMAL LOW (ref 12.0–15.0)
MCH: 28.6 pg (ref 26.0–34.0)
MCHC: 31.1 g/dL (ref 30.0–36.0)
MCV: 91.9 fL (ref 78.0–100.0)
Platelets: 325 10*3/uL (ref 150–400)
RBC: 3.32 MIL/uL — AB (ref 3.87–5.11)
RDW: 14.3 % (ref 11.5–15.5)
WBC: 9.2 10*3/uL (ref 4.0–10.5)

## 2014-07-11 LAB — GLUCOSE, CAPILLARY
GLUCOSE-CAPILLARY: 123 mg/dL — AB (ref 70–99)
Glucose-Capillary: 131 mg/dL — ABNORMAL HIGH (ref 70–99)
Glucose-Capillary: 128 mg/dL — ABNORMAL HIGH (ref 70–99)

## 2014-07-11 LAB — BASIC METABOLIC PANEL
Anion gap: 8 (ref 5–15)
BUN: 25 mg/dL — AB (ref 6–23)
CALCIUM: 8.7 mg/dL (ref 8.4–10.5)
CO2: 22 mmol/L (ref 19–32)
CREATININE: 0.98 mg/dL (ref 0.50–1.10)
Chloride: 109 mmol/L (ref 96–112)
GFR calc Af Amer: 66 mL/min — ABNORMAL LOW (ref >90)
GFR, EST NON AFRICAN AMERICAN: 57 mL/min — AB (ref >90)
Glucose, Bld: 141 mg/dL — ABNORMAL HIGH (ref 70–99)
Potassium: 4.2 mmol/L (ref 3.5–5.1)
Sodium: 139 mmol/L (ref 135–145)

## 2014-07-11 LAB — URINE CULTURE
COLONY COUNT: NO GROWTH
CULTURE: NO GROWTH

## 2014-07-11 MED ORDER — CIPROFLOXACIN HCL 500 MG PO TABS: 500.0000 mg | ORAL_TABLET | Freq: Two times a day (BID) | ORAL | Status: DC

## 2014-07-11 NOTE — Discharge Summary (Signed)
Physician Discharge Summary  Jordan [name] WLS:[id number] DOB: 12-16-1945 DOA: 07/08/2014  PCP: [name], MD  Admit date: 07/08/2014 Discharge date: 07/11/2014  Recommendations for Outpatient Follow-up:  1. Pt will need to follow up with PCP in 2-3 weeks post discharge 2. Please obtain BMP to evaluate electrolytes and kidney function 3. Please also check CBC to evaluate Hg and Hct levels 4. Continue Ciprofloxacin for 7 more days post discharge   Discharge Diagnoses:  Active Problems:   Essential hypertension   Infantile cerebral palsy   Type 2 diabetes mellitus   History of total cystectomy   Sepsis secondary to UTI   Pyelonephritis  Discharge Condition: Stable  Diet recommendation: Heart healthy diet discussed in details   History of present illness:  69 y.o. 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 This has been associated with low abd quadrants pain, throbbing and constant, 7/10 in severity, occasionally but now consistently radiating to mid back area bilaterally. Pt denies chest pain or shortness of breath. TR[facility] asked to admit for further evaluation and management of presumptive pyelonephritis.   Assessment/Plan:    Active Problems:  Sepsis secondary to UTI, Pyelonephritis - sepsis criteria met on admission with fever, tachycardia, RR>22, WBC 18K, source urinary based on UA - pt appears clinically stable this AM, reports feeling better, afebrile over the the past 24 hours  - please note that initial admission UA contaminated and recollection recommended, repeat Urine culture requested 1/25 - continue Rocephin day #4, transition to Cipro upon discharge for 7 more days - WBC trending down and is WNL this AM  - follow up on urine culture   Essential hypertension - reasonable inpatient control   - continue home regimen with Losartan upon discharge   Acute renal failure - appears to be pre renal - continued IVF and Cr is now WNL  Infantile cerebral palsy  Type 2 diabetes mellitus - taking Metformin at home - placed on SSI while inpatient  - A1C is 7   HLD - continue statin   Anemia of chronic disease, DM - drop in Hg likely dilutional from IVF pt has received - no sings of active bleeding  DVT prophylaxis: Lovenox SQ while inpatient   Obesity, Body mass index is 30  Code Status: Full.  Family Communication: plan of care discussed with the patient Disposition Plan: Home in am  IV access:  Peripheral IV Procedures and diagnostic studies:   CXR 06/29/2014 No active cardiopulmonary disease. Previously identified right base infiltrate on chest x-ray 11/29/2011 has cleared.  Medical Consultants:  None  Other Consultants:  None IAnti-Infectives:   Rocephin 1/23 --> 1/26 Cipro 1/27 --> for 7 more days    Discharge Exam: Filed Vitals:   07/11/14 1401  BP: 134/54  Pulse: 73  Temp: 99 F (37.2 C)  Resp: 20   Filed Vitals:   07/10/14 1355 07/10/14 2024 07/11/14 0446 07/11/14 1401  BP: 167/61 137/58 140/63 134/54  Pulse: 91 66 69 73  Temp: 99.2 F (37.3 C) 99.2 F (37.3 C) 99.4 F (37.4 C) 99 F (37.2 C)  TempSrc: Oral Oral Oral Oral  Resp: '19 20 20 20  ' Height:      Weight:      SpO2: 100% 98% 96% 100%    General: Pt is alert, follows commands appropriately, not in acute distress  Cardiovascular: Regular rate and rhythm, S1/S2 +, no rubs, no gallops Respiratory: Clear to auscultation bilaterally, no wheezing, no crackles, no rhonchi Abdominal: Soft, non tender, non distended, bowel sounds +, no guarding Extremities: no edema, no cyanosis, pulses palpable bilaterally DP and PT  Discharge Instructions  Discharge Instructions    Diet - low sodium heart healthy    Complete by:  As directed      Increase activity slowly     Complete by:  As directed             Medication List    TAKE these medications        ACCU-CHEK FASTCLIX LANCETS Misc  Test twice daily     acetaminophen 500 MG tablet  Commonly known as:  TYLENOL  - Take 500 mg by mouth every 6 (six) hours as needed for mild pain. Pain  -      aspirin 81 MG tablet  Take 81 mg by mouth daily.     b complex vitamins capsule  Take 1 capsule by mouth daily.     cetirizine 10 MG tablet  Commonly known as:  ZYRTEC  Take 10 mg by mouth daily as needed. allergies     ciprofloxacin 500 MG tablet  Commonly known as:  CIPRO  Take 1 tablet (500 mg total) by mouth 2 (two) times daily.     Fish Oil 1200 MG Caps  Take 1,200 mg by mouth 2 (two) times daily.     furosemide 80 MG tablet  Commonly known as:  LASIX  TAKE ONE TABLET BY MOUTH TWICE DAILY     glucose blood test strip  Commonly known as:  ACCU-CHEK SMARTVIEW  Test twice daily E11.9     losartan 100 MG tablet  Commonly known as:  COZAAR  Take one tablet by mouth one time daily     metFORMIN 500 MG tablet  Commonly known as:  GLUCOPHAGE  Take 2 tablets by mouth in the morning and 2 tablets every evening.     multivitamin capsule  Take 1 capsule by mouth daily.     polyethylene glycol powder powder  Commonly known as:  GLYCOLAX/MIRALAX  Take 17 g by mouth daily. One capfull daily by mouth with liquid     simvastatin 40 MG tablet  Commonly known as:  ZOCOR  TAKE ONE TABLET BY MOUTH ONE TIME DAILY     sodium chloride 0.65 % nasal spray  Commonly known as:  OCEAN  - Place 1-2 sprays into the nose as needed. Allergies  -      Vitamin D 2000 UNITS Caps  Take 2,000 Units by mouth See admin instructions. Takes only on days Monday through thursday           Follow-up Information    Follow up with [name], MD.   Specialty:  Family Medicine   Contact information:   [address] [place] [address] [phone]        The results of significant  diagnostics from this hospitalization (including imaging, microbiology, ancillary and laboratory) are listed below for reference.     Microbiology: Recent Results (from the past 240 hour(s))  Urine culture     Status: None   Collection Time: 07/08/14  7:05 PM  Result Value Ref Range Status   Specimen Description URINE, RANDOM  Final   Special Requests NONE  Final   Colony Count   Final    >=100,000 COLONIES/ML Performed at [facility]  Culture   Final    Multiple bacterial morphotypes present, none predominant. Suggest appropriate recollection if clinically indicated. Performed at [facility]    Report Status 07/10/2014 FINAL  Final  Blood culture (routine x 2)     Status: None (Preliminary result)   Collection Time: 07/08/14  7:30 PM  Result Value Ref Range Status   Specimen Description BLOOD RIGHT HAND  Final   Special Requests BOTTLES DRAWN AEROBIC AND ANAEROBIC 5CC EACH  Final   Culture   Final           BLOOD CULTURE RECEIVED NO GROWTH TO DATE CULTURE WILL BE HELD FOR 5 DAYS BEFORE ISSUING A FINAL NEGATIVE REPORT Performed at [facility]    Report Status PENDING  Incomplete  Blood culture (routine x 2)     Status: None (Preliminary result)   Collection Time: 07/08/14  7:31 PM  Result Value Ref Range Status   Specimen Description BLOOD RIGHT ANTECUBITAL  Final   Special Requests BOTTLES DRAWN AEROBIC AND ANAEROBIC 5CC EACH  Final   Culture   Final           BLOOD CULTURE RECEIVED NO GROWTH TO DATE CULTURE WILL BE HELD FOR 5 DAYS BEFORE ISSUING A FINAL NEGATIVE REPORT Performed at [facility]    Report Status PENDING  Incomplete     Labs: Basic Metabolic Panel:  Recent Labs Lab 07/08/14 1859 07/09/14 0555 07/10/14 0417 07/11/14 0434  NA 137 140 138 139  K 3.7 4.5 4.1 4.2  CL 107 107 108 109  CO2 '20 22 20 22  ' GLUCOSE 201* 180* 153* 141*  BUN 29* 26* 25* 25*  CREATININE 1.01 1.16* 1.09 0.98  CALCIUM 9.5 9.2 8.6 8.7  MG   --  1.5  --   --   PHOS  --  3.6  --   --    Liver Function Tests:  Recent Labs Lab 07/09/14 0555  AST 18  ALT 25  ALKPHOS 70  BILITOT 0.6  PROT 6.8  ALBUMIN 3.2*   No results for input(s): LIPASE, AMYLASE in the last 168 hours. No results for input(s): AMMONIA in the last 168 hours. CBC:  Recent Labs Lab 07/08/14 1859 07/09/14 0555 07/10/14 0417 07/11/14 0434  WBC 18.0* 20.2* 14.4* 9.2  NEUTROABS 15.3*  --   --   --   HGB 11.5* 11.5* 9.9* 9.5*  HCT 35.2* 35.3* 32.0* 30.5*  MCV 90.5 91.2 92.2 91.9  PLT 304 291 285 325   CBG:  Recent Labs Lab 07/10/14 1135 07/10/14 1716 07/10/14 2128 07/11/14 0751 07/11/14 1140  GLUCAP 132* 149* 130* 131* 123*   SIGNED: Time coordinating discharge: Over 30 minutes  Faye Ramsay, MD  [facility] 07/11/2014, 2:32 PM Pager [phone]  If 7PM-7AM, please contact night-coverage [email or web address] Password [email or web address]

## 2014-07-11 NOTE — Discharge Instructions (Signed)

## 2014-07-12 ENCOUNTER — Inpatient Hospital Stay (HOSPITAL_COMMUNITY): Payer: Medicare HMO

## 2014-07-12 DIAGNOSIS — G809 Cerebral palsy, unspecified: Secondary | ICD-10-CM

## 2014-07-12 LAB — GLUCOSE, CAPILLARY
GLUCOSE-CAPILLARY: 130 mg/dL — AB (ref 70–99)
GLUCOSE-CAPILLARY: 112 mg/dL — AB (ref 70–99)
Glucose-Capillary: 157 mg/dL — ABNORMAL HIGH (ref 70–99)
Glucose-Capillary: 147 mg/dL — ABNORMAL HIGH (ref 70–99)

## 2014-07-12 MED ORDER — FUROSEMIDE 80 MG PO TABS: 80.0000 mg | ORAL_TABLET | Freq: Every day | ORAL | Status: DC

## 2014-07-12 NOTE — Progress Notes (Signed)
Patient discharged home with husband, discharge instructions given and explained to patient/husband and they verbalized understanding, patient denies any pain/distress. No wound noted, skin intact. accompanied home by husband, transported to the car by staff.

## 2014-07-12 NOTE — Care Management Note (Signed)
    Page 1 of 1   07/12/2014     2:35:16 PM CARE MANAGEMENT NOTE 07/12/2014  Patient:  Jordan [name], Jordan [name]   Account Number:  [id number]  Date Initiated:  07/12/2014  Documentation initiated by:  Dessa Phi  Subjective/Objective Assessment:   69 y/o f admitted w/Pyelonephritis.     Action/Plan:   From home.Has pcp,pharmacy, transp.   Anticipated DC Date:  07/12/2014   Anticipated DC Plan:  [place]  CM consult      Choice offered to / List presented to:             Status of service:  In process, will continue to follow Medicare Important Message given?  YES (If response is "NO", the following Medicare IM given date fields will be blank) Date Medicare IM given:  07/12/2014 Medicare IM given by:  [facility] Date Additional Medicare IM given:   Additional Medicare IM given by:    Discharge Disposition:    Per UR Regulation:  Reviewed for med. necessity/level of care/duration of stay  If discussed at [place] of Stay Meetings, dates discussed:    Comments:  07/12/14 Dessa Phi RN BSN NCM [phone] For renal u/s today.PT-no f/u.Patient has own transp home. No d/c needs.

## 2014-07-12 NOTE — Progress Notes (Signed)
PROGRESS NOTE    Kory Panjwani ZOX:[id number] DOB: 1945/08/17 DOA: 07/08/2014 PCP: [name], MD   Outpatient follow-up recommendations 1. Dr. Carolann Littler, PCP in 1 week with repeat labs (CBC & BMP). 2. Dr. Stephan Minister, Urology in 2-3 weeks.  HPI/Brief narrative 69 y.o. female with HTN, HLD, cerebral palsy, wheel chair mobile, s/p ileal conduit with urostomy for recurrent UTI's (1992), DM2, presented to [facility] ED with main concern of 2 days duration of foul smelling urine, fevers, chills, malaise, nausea, poor oral intake. This had been associated with low abd quadrants pain. She was admitted for presumed UTI/pyelonephritis.   Assessment/Plan:  1. Sepsis secondary to UTI/pyelonephritis: Sepsis criteria met on admission. Sepsis physiology resolved. 2. Recurrent UTI/pyelonephritis: Initial urine culture from 1/23 was suggestive of contamination. She was treated empirically with IV Rocephin and completed 4 days. Repeat urine culture on 1/25 (after antibiotics) was negative. Discussed with patient's primary urologist Dr. Stephan Minister who recommended obtaining renal ultrasound given prior history of kidney stones. Renal ultrasound report as below was reviewed with him and he stated that this was unchanged compared to June 2015. He agreed with discharge on oral antibiotics and patient to follow-up with him in 2-3 weeks. Patient verbalized understanding. 3. Essential hypertension: Controlled. 4. Mild acute kidney injury: Resolved. 5. Cerebral palsy: Patient has been mobile using a wheelchair for decades and works as a Social worker. 6. Type II DM: Placed on SSI in hospital with reasonable control. Resume metformin at discharge. A1c 7 7. Hyperlipidemia: Continue statins 8. Anemia of chronic disease: Stable. Follow CBC as outpatient. 9. Obesity/BMI 30 10. Status post ileal Conduit   Code Status: Full Family Communication: None at bedside Disposition Plan: [place]   Consultants:  Discussed with urologist  Procedures:  None   Subjective: Patient states that she is back to her usual and denies any specific complaints.  Objective: Filed Vitals:   07/11/14 1401 07/11/14 2100 07/12/14 0602 07/12/14 1539  BP: 134/54 142/52 141/53 145/56  Pulse: 73 69 60 59  Temp: 99 F (37.2 C) 98 F (36.7 C) 97.7 F (36.5 C) 97.7 F (36.5 C)  TempSrc: Oral Oral Oral Oral  Resp: _0 Height:      Weight:      SpO2: 100% 98% 98% 98%    Intake/Output Summary (Last 24 hours) at 07/12/14 1650 Last data filed at 07/12/14 1300  Gross per 24 hour  Intake    770 ml  Output   3425 ml  Net  -2655 ml   Filed Weights   07/09/14 0940  Weight: 81.6 kg (179 lb 14.3 oz)     Exam:  General exam: Pleasant elderly female sitting up comfortably in bed. Respiratory system: Clear. No increased work of breathing. Cardiovascular system: S1 & S2 heard, RRR. No JVD, murmurs, gallops, clicks or pedal edema. Gastrointestinal system: Abdomen is nondistended, soft and nontender. Normal bowel sounds heard. Ideal conduit intact with straw-colored urine. Central nervous system: Alert and oriented. No focal neurological deficits. Extremities: Symmetric 5 x 5 power in upper extremities. Grade 1 x 5 power in lower extremities   Data Reviewed: Basic Metabolic Panel:  Recent Labs Lab 07/08/14 1859 07/09/14 0555 07/10/14 0417 07/11/14 0434  NA 137 140 138 139  K 3.7 4.5 4.1 4.2  CL 107 107 108 109  CO2 _1 GLUCOSE 201* 180* 153* 141*  BUN 29* 26* 25* 25*  CREATININE 1.01 1.16* 1.09 0.98  CALCIUM 9.5  9.2 8.6 8.7  MG  --  1.5  --   --   PHOS  --  3.6  --   --    Liver Function Tests:  Recent Labs Lab 07/09/14 0555  AST 18  ALT 25  ALKPHOS 70  BILITOT 0.6  PROT 6.8  ALBUMIN 3.2*   No results for input(s): LIPASE, AMYLASE in the last 168 hours. No results for input(s): AMMONIA in the last 168 hours. CBC:  Recent Labs Lab  07/08/14 1859 07/09/14 0555 07/10/14 0417 07/11/14 0434  WBC 18.0* 20.2* 14.4* 9.2  NEUTROABS 15.3*  --   --   --   HGB 11.5* 11.5* 9.9* 9.5*  HCT 35.2* 35.3* 32.0* 30.5*  MCV 90.5 91.2 92.2 91.9  PLT 304 291 285 325   Cardiac Enzymes: No results for input(s): CKTOTAL, CKMB, CKMBINDEX, TROPONINI in the last 168 hours. BNP (last 3 results) No results for input(s): PROBNP in the last 8760 hours. CBG:  Recent Labs Lab 07/11/14 1140 07/11/14 1711 07/11/14 2121 07/12/14 0743 07/12/14 1210  GLUCAP 123* 128* 157* 130* 147*    Recent Results (from the past 240 hour(s))  Urine culture     Status: None   Collection Time: 07/08/14  7:05 PM  Result Value Ref Range Status   Specimen Description URINE, RANDOM  Final   Special Requests NONE  Final   Colony Count   Final    >=100,000 COLONIES/ML Performed at [facility]    Culture   Final    Multiple bacterial morphotypes present, none predominant. Suggest appropriate recollection if clinically indicated. Performed at [facility]    Report Status 07/10/2014 FINAL  Final  Blood culture (routine x 2)     Status: None (Preliminary result)   Collection Time: 07/08/14  7:30 PM  Result Value Ref Range Status   Specimen Description BLOOD RIGHT HAND  Final   Special Requests BOTTLES DRAWN AEROBIC AND ANAEROBIC 5CC EACH  Final   Culture   Final           BLOOD CULTURE RECEIVED NO GROWTH TO DATE CULTURE WILL BE HELD FOR 5 DAYS BEFORE ISSUING A FINAL NEGATIVE REPORT Performed at [facility]    Report Status PENDING  Incomplete  Blood culture (routine x 2)     Status: None (Preliminary result)   Collection Time: 07/08/14  7:31 PM  Result Value Ref Range Status   Specimen Description BLOOD RIGHT ANTECUBITAL  Final   Special Requests BOTTLES DRAWN AEROBIC AND ANAEROBIC 5CC EACH  Final   Culture   Final           BLOOD CULTURE RECEIVED NO GROWTH TO DATE CULTURE WILL BE HELD FOR 5 DAYS BEFORE ISSUING A FINAL  NEGATIVE REPORT Performed at [facility]    Report Status PENDING  Incomplete  Culture, Urine     Status: None   Collection Time: 07/10/14  5:50 PM  Result Value Ref Range Status   Specimen Description URINE, CLEAN CATCH  Final   Special Requests NONE  Final   Colony Count NO GROWTH Performed at [facility]   Final   Culture NO GROWTH Performed at [facility]   Final   Report Status 07/11/2014 FINAL  Final         Studies: US Renal  07/12/2014   CLINICAL DATA:  69 year old female with urinary tract infection with current history of bladder resection and urinary diversion ileal conduit several years ago.  Initial encounter.  EXAM: RENAL/URINARY TRACT ULTRASOUND COMPLETE  COMPARISON:  [facility] Renal ultrasound 11/19/2012. CT Abdomen and Pelvis 11/30/2011.  FINDINGS: Right Kidney:  Length: 11.1 cm. Echogenicity within normal limits. No mass. Borderline to mild enlargement of the intra renal collecting system and right renal pelvis. Suspect renal cortical thinning since 2013.  Left Kidney:  Length: 12.0 cm. Echogenicity within normal limits. No mass or hydronephrosis visualized.  Bladder:  Surgically absent  IMPRESSION: 1. Mild right hydronephrosis. Chronic right renal cortical thinning. 2. Negative sonographic appearance of the left kidney.   Electronically Signed   By: Lars Pinks M.D.   On: 07/12/2014 14:23        Scheduled Meds: . aspirin EC  81 mg Oral Daily  . cefTRIAXone (ROCEPHIN) IVPB 1 gram/50 mL D5W  1 g Intravenous Q24H  . docusate sodium  100 mg Oral BID  . enoxaparin (LOVENOX) injection  40 mg Subcutaneous Q24H  . furosemide  80 mg Oral Daily  . insulin aspart  0-5 Units Subcutaneous QHS  . insulin aspart  0-9 Units Subcutaneous TID WC  . loratadine  10 mg Oral Daily  . losartan  25 mg Oral Daily  . polyethylene glycol  17 g Oral Daily  . simvastatin  40 mg Oral q1800  . sodium chloride  3 mL Intravenous Q12H    Continuous Infusions:   Active Problems:   Essential hypertension   Infantile cerebral palsy   Type 2 diabetes mellitus   History of total cystectomy   Sepsis secondary to UTI   Pyelonephritis    Time spent: 40 minutes.    Vernell Leep, MD, FACP, FHM. [facility] Pager [phone]  If 7PM-7AM, please contact night-coverage [email or web address] Password [email or web address] 07/12/2014, 4:50 PM    LOS: 4 days

## 2014-07-15 ENCOUNTER — Other Ambulatory Visit: Payer: Self-pay | Admitting: Family Medicine

## 2014-07-15 LAB — CULTURE, BLOOD (ROUTINE X 2)
Culture: NO GROWTH
Culture: NO GROWTH

## 2014-08-04 ENCOUNTER — Encounter: Payer: Self-pay | Admitting: Family Medicine

## 2014-08-04 ENCOUNTER — Ambulatory Visit (INDEPENDENT_AMBULATORY_CARE_PROVIDER_SITE_OTHER): Payer: Medicare HMO | Admitting: Family Medicine

## 2014-08-04 VITALS — BP 130/72 | HR 76 | Temp 98.2°F

## 2014-08-04 DIAGNOSIS — E1122 Type 2 diabetes mellitus with diabetic chronic kidney disease: Secondary | ICD-10-CM | POA: Insufficient documentation

## 2014-08-04 DIAGNOSIS — E119 Type 2 diabetes mellitus without complications: Secondary | ICD-10-CM

## 2014-08-04 DIAGNOSIS — N179 Acute kidney failure, unspecified: Secondary | ICD-10-CM

## 2014-08-04 DIAGNOSIS — D649 Anemia, unspecified: Secondary | ICD-10-CM

## 2014-08-04 LAB — BASIC METABOLIC PANEL
BUN: 35 mg/dL — ABNORMAL HIGH (ref 6–23)
CALCIUM: 10.3 mg/dL (ref 8.4–10.5)
CO2: 24 mEq/L (ref 19–32)
CREATININE: 0.82 mg/dL (ref 0.40–1.20)
Chloride: 107 mEq/L (ref 96–112)
GFR: 73.21 mL/min (ref >60.00)
GLUCOSE: 126 mg/dL — AB (ref 70–99)
Potassium: 4.7 mEq/L (ref 3.5–5.1)
SODIUM: 138 meq/L (ref 135–145)

## 2014-08-04 LAB — CBC WITH DIFFERENTIAL/PLATELET
BASOS PCT: 0.6 % (ref 0.0–3.0)
Basophils Absolute: 0 10*3/uL (ref 0.0–0.1)
EOS PCT: 2 % (ref 0.0–5.0)
Eosinophils Absolute: 0.1 10*3/uL (ref 0.0–0.7)
HEMATOCRIT: 36.7 % (ref 36.0–46.0)
HEMOGLOBIN: 12.2 g/dL (ref 12.0–15.0)
LYMPHS ABS: 2.3 10*3/uL (ref 0.7–4.0)
Lymphocytes Relative: 36.7 % (ref 12.0–46.0)
MCHC: 33.3 g/dL (ref 30.0–36.0)
MCV: 87.1 fl (ref 78.0–100.0)
Monocytes Absolute: 0.4 10*3/uL (ref 0.1–1.0)
Monocytes Relative: 6.9 % (ref 3.0–12.0)
Neutro Abs: 3.4 10*3/uL (ref 1.4–7.7)
Neutrophils Relative %: 53.8 % (ref 43.0–77.0)
PLATELETS: 291 10*3/uL (ref 150.0–400.0)
RBC: 4.21 Mil/uL (ref 3.87–5.11)
RDW: 14.6 % (ref 11.5–15.5)
WBC: 6.3 10*3/uL (ref 4.0–10.5)

## 2014-08-04 NOTE — Progress Notes (Signed)
Subjective:    Patient ID: Jordan [name], female    DOB: 19-Mar-1946, 69 y.o.   MRN: [id number]  HPI Patient seen for hospital follow-up. She was admitted on 07/08/2014 with urosepsis. She has history of cerebral palsy and long history of recurrent UTIs back to early 1990s. She had ileal conduit with urostomy for current UTIs 1992. She also type 2 diabetes. She did present to the emergency department with 2 day history of fever, chills, malaise, nausea, decreased appetite. She was diagnosed with sepsis and met criteria. Her white count was 18,000. She was initially treated with Rocephin. Her initial urine culture grew out greater than 100,000 colonies multiple morphotypes. Blood cultures were negative. Repeat urine culture was negative. She was transitioned to Cipro. Her white count improved during hospitalization. She had acute renal worsening during hospitalization and this was relatively mild. She was started back on losartan at discharge. She is also started back on her usual metformin. Her blood sugars been stable. Recent A1c 7.  Patient has history of anemia of chronic disease. Baseline hemoglobin around 11.5. She had decreased to 9.5 likely dilutional from IV fluids. No symptoms or signs of active bleeding. She has scheduled colonoscopy in March.  Past Medical History  Diagnosis Date  . HYPERLIPIDEMIA 11/29/2009  . HYPERTENSION 11/29/2009  . COLONIC POLYPS 07/11/2010  . CEREBRAL PALSY 07/11/2010    PT CAN TRANSFER BED TO CHAIR--CAN STAND BRIEFLY--BUT NOT ABLE TO WALK; RT LEG HYPEREXTENDS BACKWARD AND IS WEAK-HAS A POWER CHAIR  . Diabetes mellitus     ORAL MED FOR DIABETES  . Blood transfusion 1992    AFTER  SURGERY TO REMOVE BLADDER  . GERD (gastroesophageal reflux disease)     PAST HISTORY GERD--WATCHES DIET - NOT ON ANY MEDS FOR GERD  . Incisional hernia     NO PAIN-NOT CAUSING ANY DISCOMFORT  . Arthritis     OA  . PONV (postoperative nausea and vomiting)    Past Surgical History    Procedure Laterality Date  . Appendectomy    . Cholecystectomy    . Tonsillectomy    . Revision urostomy cutaneous  1992  . Abdominal hysterectomy  1990    partial  . Radical cystectomy  1992    neurogenic bladder sec CP  . Rt ankle fusion  1975    SEVERAL RT ANKLE SURGERIES PRIOR TO THE FUSION  . Left carpal tunnel release x 2    . Right carpal tunnel release    . Lumbar sympathetectomies x 2    . 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 Nephrolithotomy  10/01/2011    Procedure: NEPHROLITHOTOMY PERCUTANEOUS;  Surgeon: Franchot Gallo, MD;  Location: [facility];  Service: Urology;  Laterality: Right;         reports that she has never smoked. She has never used smokeless tobacco. She reports that she does not drink alcohol or use illicit drugs. family history includes Cancer in her father; Emphysema in her father; Heart disease in her father; Heart disease (age of onset: 87) in her mother. Allergies  Allergen Reactions  . Morphine Anaphylaxis  . Baclofen     GI upset  . Lisinopril Cough  . Metoclopramide Hcl     Reglan, irritable  . Penicillins     " MAJOR CASE OF HIVES"      Review of Systems  Constitutional: Negative for fever, chills and unexpected weight change.  Respiratory: Negative for cough and shortness of breath.  Cardiovascular: Negative for chest pain, palpitations and leg swelling.  Neurological: Negative for dizziness.  Hematological: Negative for adenopathy.       Objective:   Physical Exam  Constitutional: She appears well-developed and well-nourished. No distress.  HENT:  Mouth/Throat: Oropharynx is clear and moist.  Neck: Neck supple. No thyromegaly present.  Cardiovascular: Normal rate and regular rhythm.   Pulmonary/Chest: Effort normal and breath sounds normal. No respiratory distress. She has no wheezes. She has no rales.  Musculoskeletal: She exhibits no edema.  Neurological: She is alert.          Assessment & Plan:   #1 Patient seen for follow-up recent urosepsis. Clinically stable. She is doing well this time having completed full course of antibiotics. Follow-up promptly for any recurrent dysuria, fever, chills #2 type 2 diabetes which is been stable #3 normocytic anemia. History of anemia of chronic disease. Recent worsening probably dilutional from IV fluids. Recheck CBC.  Colonoscopy pending #4 history of acute renal failure with recent hospitalization. Her creatinine bumped up minimally. Recheck basic metabolic panel

## 2014-08-04 NOTE — Progress Notes (Signed)
Pre visit review using our clinic review tool, if applicable. No additional management support is needed unless otherwise documented below in the visit note. 

## 2014-08-14 ENCOUNTER — Telehealth: Payer: Self-pay | Admitting: *Deleted

## 2014-08-14 NOTE — Telephone Encounter (Signed)
Dr Carlean Purl, This Pt is scheduled for a PV on Friday 3-4 and then a colon with you 3-18 Friday. She has a HX of Cerebal Palsy, a family hx of colon cancer in her father and a hx of colon polyps. I cannot find a lab report on her polyps removed 04-05-2008. The colon  report is in epic however and she had two polyps, both pedunculated one at the hepatic flex and one ileum. She was admitted to [facility] 07-08-2014 with uro sepsis which she has recovered from and all her labs including her creatinine and hemoglobin as back to normal .   1. Her last Ov with PCP states she can transfer to chair to bed and vice versa  And briefly stand but she cannot ambulate, is she ok for [facility]?  2. Her last colon 04-05-2008 with Dr Weston Anna says poor prep but doesn't say which. Do you want her to have a 2 day prep if she's a go  For here?  Thanks for your time and please advise  Marijean Niemann

## 2014-08-15 NOTE — Telephone Encounter (Signed)
Let me see her in the office instead

## 2014-08-15 NOTE — Telephone Encounter (Signed)
I spoke with pt, who states she is currently moving and cannot do the procedure for "a little while."  OV made with Dr. Carlean Purl 10-17-14 at 2:45 p.m. Per his order.

## 2014-08-15 NOTE — Telephone Encounter (Signed)
Dr. Carlean Purl,  [facility] do not have any new pt appointments available for a while. Would it be ok if this pt saw an extender?  Thanks, [employer]

## 2014-09-01 ENCOUNTER — Encounter: Payer: Medicare HMO | Admitting: Internal Medicine

## 2014-10-17 ENCOUNTER — Ambulatory Visit (INDEPENDENT_AMBULATORY_CARE_PROVIDER_SITE_OTHER): Payer: Medicare HMO | Admitting: Internal Medicine

## 2014-10-17 ENCOUNTER — Encounter: Payer: Self-pay | Admitting: Internal Medicine

## 2014-10-17 VITALS — BP 130/60 | HR 64 | Ht 69.0 in | Wt 178.0 lb

## 2014-10-17 DIAGNOSIS — Z86010 Personal history of colonic polyps: Secondary | ICD-10-CM

## 2014-10-17 DIAGNOSIS — K5909 Other constipation: Secondary | ICD-10-CM

## 2014-10-17 DIAGNOSIS — K5900 Constipation, unspecified: Secondary | ICD-10-CM | POA: Diagnosis not present

## 2014-10-17 MED ORDER — MOVIPREP 100 G PO SOLR: ORAL | Status: DC

## 2014-10-17 NOTE — Patient Instructions (Addendum)
You have been scheduled for a colonoscopy. Please follow written instructions given to you at your visit today.  Please pick up your prep supplies at the pharmacy within the next 1-3 days. If you use inhalers (even only as needed), please bring them with you on the day of your procedure.   I appreciate the opportunity to care for you. Carl Gessner, MD, FACG 

## 2014-10-17 NOTE — Progress Notes (Signed)
Subjective:    Patient ID: Jordan [name], female    DOB: [birth year], 69 y.o.   MRN: [id number] Cc: hx polyps HPI The patient is a very nice elderly woman with a history of colon polyps. She had a colonoscopy elsewhere with a polyp removed and I do not have that pathology report. She had a colonoscopy in 2009 in [place]. The prep was reported to be poor. There was a small polyp removed. She has cerebral palsy, she is wheelchair-bound though she can transfer, hip surgery led to the wheelchair use. She is in a motorized scooter. She has a bowel regimen with daily MiraLAX and occasional Senokot and moves her bowels several times a week. She does have chronic constipation however. She is able to stand and transfer. She is responsible for her own activities of daily living. Number of years ago she had a ureterostomy created because of recurrent urinary tract infections. Allergies  Allergen Reactions  . Morphine Anaphylaxis  . Baclofen     GI upset  . Lisinopril Cough  . Metoclopramide Hcl     Reglan, irritable  . Penicillins     " MAJOR CASE OF HIVES"   Outpatient Prescriptions Prior to Visit  Medication Sig Dispense Refill  . ACCU-CHEK FASTCLIX LANCETS MISC Test twice daily 102 each 11  . acetaminophen (TYLENOL) 500 MG tablet Take 500 mg by mouth every 6 (six) hours as needed for mild pain. Pain     . aspirin 81 MG tablet Take 81 mg by mouth daily.     Marland Kitchen b complex vitamins capsule Take 1 capsule by mouth daily.     . cetirizine (ZYRTEC) 10 MG tablet Take 10 mg by mouth daily as needed. allergies    . Cholecalciferol (VITAMIN D) 2000 UNITS CAPS Take 2,000 Units by mouth See admin instructions. Takes only on days Monday through thursday    . furosemide (LASIX) 80 MG tablet Take 1 tablet (80 mg total) by mouth daily.    Marland Kitchen glucose blood (ACCU-CHEK SMARTVIEW) test strip Test twice daily E11.9 100 each 10  . losartan (COZAAR) 100 MG tablet TAKE ONE TABLET BY MOUTH ONE TIME DAILY  90 tablet 2  .  metFORMIN (GLUCOPHAGE) 500 MG tablet Take 2 tablets by mouth in the morning and 2 tablets every evening. 180 tablet 3  . Multiple Vitamin (MULTIVITAMIN) capsule Take 1 capsule by mouth daily.     . Omega-3 Fatty Acids (FISH OIL) 1200 MG CAPS Take 1,200 mg by mouth 2 (two) times daily.     . polyethylene glycol powder (GLYCOLAX/MIRALAX) powder Take 17 g by mouth daily. One capfull daily by mouth with liquid    . simvastatin (ZOCOR) 40 MG tablet TAKE ONE TABLET BY MOUTH ONE TIME DAILY 90 tablet 3  . sodium chloride (OCEAN) 0.65 % nasal spray Place 1-2 sprays into the nose as needed. Allergies     . ciprofloxacin (CIPRO) 500 MG tablet Take 1 tablet (500 mg total) by mouth 2 (two) times daily. 14 tablet 0   No facility-administered medications prior to visit.   Past Medical History  Diagnosis Date  . HYPERLIPIDEMIA 11/29/2009  . HYPERTENSION 11/29/2009  . COLONIC POLYPS 07/11/2010  . CEREBRAL PALSY 07/11/2010    PT CAN TRANSFER BED TO CHAIR--CAN STAND BRIEFLY--BUT NOT ABLE TO WALK; RT LEG HYPEREXTENDS BACKWARD AND IS WEAK-HAS A POWER CHAIR  . Diabetes mellitus     ORAL MED FOR DIABETES  . Blood transfusion 1992    AFTER  SURGERY TO REMOVE BLADDER  . GERD (gastroesophageal reflux disease)     PAST HISTORY GERD--WATCHES DIET - NOT ON ANY MEDS FOR GERD  . Incisional hernia     NO PAIN-NOT CAUSING ANY DISCOMFORT  . Arthritis     OA  . PONV (postoperative nausea and vomiting)    Past Surgical History  Procedure Laterality Date  . Appendectomy    . Cholecystectomy    . Tonsillectomy    . Revision urostomy cutaneous  1992  . Abdominal hysterectomy  1990    partial  . Radical cystectomy  1992    neurogenic bladder sec CP  . Rt ankle fusion  1975    SEVERAL RT ANKLE SURGERIES PRIOR TO THE FUSION  . Left carpal tunnel release x 2    . Right carpal tunnel release    . Lumbar sympathetectomies x 2    . 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 Nephrolithotomy  10/01/2011     Procedure: NEPHROLITHOTOMY PERCUTANEOUS;  Surgeon: Franchot Gallo, MD;  Location: [facility];  Service: Urology;  Laterality: Right;        History   Social History  . Marital Status: Married    Spouse Name: N/A  . Number of Children: 1  . Years of Education: N/A   Occupational History  . Counselor; [employer]    Social History Main Topics  . Smoking status: Never Smoker   . Smokeless tobacco: Never Used  . Alcohol Use: No  . Drug Use: No  . Sexual Activity: Not on file   Other Topics Concern  . 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 Heart disease Mother 36    COPD  . Heart disease Father     COPD   . Emphysema Father   . Colon cancer Father 51    Died at 32  . Bladder Cancer Father   . Colon polyps Sister   . Esophageal cancer Neg Hx   . Kidney disease Neg Hx      Review of Systems As per history of present illness. All other review of systems are negative at this time.    Objective:   Physical Exam @BP  130/60 mmHg  Pulse 64  Ht 5\' 9"  (1.753 m)  Wt 178 lb (80.74 kg)  BMI 26.27 kg/m2@  General:  NAD, obese in motorized scooter Eyes:   anicteric Lungs:  clear Heart:: S1S2 no rubs, murmurs or gallops Abdomen:  Obese soft and nontender, BS+ ureterostomy bag RLQ   Data Reviewed:  As per history of present illness I have reviewed basic metabolic panel, CBC studies from 2016 are in the chart as well as primary care notes.    Assessment & Plan:   1. Hx of colonic polyps   2. Chronic constipation    We discussed risks benefits and indications of colonoscopy. Her chronic constipation makes prepping an issue, and she took a standard prep with Go[facility] in 2009 it sounds like. I will have her use a double prep prior to colonoscopy. She was inclined to have an optical colonoscopy as opposed to any type of stool testing. Procedure will be  performed at [facility] risks and benefits as well as alternatives of endoscopic procedure(s) have been discussed and reviewed. All questions answered. The patient agrees to proceed.  I appreciate the opportunity to care for this patient. CC: [name], MD

## 2014-11-03 ENCOUNTER — Other Ambulatory Visit: Payer: Self-pay | Admitting: Family Medicine

## 2014-11-07 ENCOUNTER — Encounter: Payer: Self-pay | Admitting: *Deleted

## 2014-11-28 ENCOUNTER — Encounter (HOSPITAL_COMMUNITY): Payer: Self-pay | Admitting: *Deleted

## 2014-12-07 ENCOUNTER — Ambulatory Visit (HOSPITAL_COMMUNITY)
Admission: RE | Admit: 2014-12-07 | Discharge: 2014-12-07 | Disposition: A | Discharge status: Home / Self Care | Payer: Medicare HMO | Source: Ambulatory Visit | Attending: Internal Medicine | Admitting: Internal Medicine

## 2014-12-07 ENCOUNTER — Ambulatory Visit (HOSPITAL_COMMUNITY): Payer: Medicare HMO | Admitting: Anesthesiology

## 2014-12-07 ENCOUNTER — Encounter (HOSPITAL_COMMUNITY)
Admission: RE | Disposition: A | Discharge status: Home / Self Care | Payer: Self-pay | Source: Ambulatory Visit | Attending: Internal Medicine

## 2014-12-07 ENCOUNTER — Encounter (HOSPITAL_COMMUNITY): Payer: Self-pay | Admitting: Anesthesiology

## 2014-12-07 DIAGNOSIS — Z936 Other artificial openings of urinary tract status: Secondary | ICD-10-CM | POA: Diagnosis not present

## 2014-12-07 DIAGNOSIS — Z86010 Personal history of colonic polyps: Secondary | ICD-10-CM | POA: Insufficient documentation

## 2014-12-07 DIAGNOSIS — Z1211 Encounter for screening for malignant neoplasm of colon: Secondary | ICD-10-CM | POA: Insufficient documentation

## 2014-12-07 DIAGNOSIS — Z7982 Long term (current) use of aspirin: Secondary | ICD-10-CM | POA: Insufficient documentation

## 2014-12-07 DIAGNOSIS — G809 Cerebral palsy, unspecified: Secondary | ICD-10-CM | POA: Diagnosis not present

## 2014-12-07 DIAGNOSIS — D123 Benign neoplasm of transverse colon: Secondary | ICD-10-CM | POA: Diagnosis not present

## 2014-12-07 DIAGNOSIS — D125 Benign neoplasm of sigmoid colon: Secondary | ICD-10-CM | POA: Diagnosis not present

## 2014-12-07 DIAGNOSIS — Z79899 Other long term (current) drug therapy: Secondary | ICD-10-CM | POA: Insufficient documentation

## 2014-12-07 DIAGNOSIS — I10 Essential (primary) hypertension: Secondary | ICD-10-CM | POA: Insufficient documentation

## 2014-12-07 DIAGNOSIS — E785 Hyperlipidemia, unspecified: Secondary | ICD-10-CM | POA: Diagnosis not present

## 2014-12-07 DIAGNOSIS — M1990 Unspecified osteoarthritis, unspecified site: Secondary | ICD-10-CM | POA: Insufficient documentation

## 2014-12-07 DIAGNOSIS — Z800 Family history of malignant neoplasm of digestive organs: Secondary | ICD-10-CM | POA: Diagnosis not present

## 2014-12-07 DIAGNOSIS — K635 Polyp of colon: Secondary | ICD-10-CM | POA: Insufficient documentation

## 2014-12-07 DIAGNOSIS — E119 Type 2 diabetes mellitus without complications: Secondary | ICD-10-CM | POA: Insufficient documentation

## 2014-12-07 DIAGNOSIS — K219 Gastro-esophageal reflux disease without esophagitis: Secondary | ICD-10-CM | POA: Diagnosis not present

## 2014-12-07 HISTORY — DX: Other artificial openings of urinary tract status: Z93.6

## 2014-12-07 HISTORY — PX: COLONOSCOPY WITH PROPOFOL: SHX5780

## 2014-12-07 SURGERY — COLONOSCOPY WITH PROPOFOL
Anesthesia: Monitor Anesthesia Care

## 2014-12-07 MED ORDER — PROPOFOL 10 MG/ML IV BOLUS
INTRAVENOUS | Status: AC
  Filled 2014-12-07: qty 20

## 2014-12-07 MED ORDER — LIDOCAINE HCL (CARDIAC) 20 MG/ML IV SOLN
INTRAVENOUS | Status: DC | PRN
  Administered 2014-12-07: 100 mg via INTRAVENOUS

## 2014-12-07 MED ORDER — SODIUM CHLORIDE 0.9 % IV SOLN: INTRAVENOUS | Status: DC

## 2014-12-07 MED ORDER — LIDOCAINE HCL (CARDIAC) 20 MG/ML IV SOLN
INTRAVENOUS | Status: AC
  Filled 2014-12-07: qty 5

## 2014-12-07 MED ORDER — KETAMINE HCL 10 MG/ML IJ SOLN
INTRAMUSCULAR | Status: DC | PRN
  Administered 2014-12-07: 20 mg via INTRAVENOUS

## 2014-12-07 MED ORDER — ONDANSETRON HCL 4 MG/2ML IJ SOLN
INTRAMUSCULAR | Status: DC | PRN
  Administered 2014-12-07: 4 mg via INTRAVENOUS

## 2014-12-07 MED ORDER — LACTATED RINGERS IV SOLN
INTRAVENOUS | Status: DC
  Administered 2014-12-07: 1000 mL via INTRAVENOUS

## 2014-12-07 MED ORDER — PROPOFOL INFUSION 10 MG/ML OPTIME
INTRAVENOUS | Status: DC | PRN
  Administered 2014-12-07: 300 ug/kg/min via INTRAVENOUS

## 2014-12-07 MED ORDER — ONDANSETRON HCL 4 MG/2ML IJ SOLN
INTRAMUSCULAR | Status: AC
  Filled 2014-12-07: qty 2

## 2014-12-07 SURGICAL SUPPLY — 22 items

## 2014-12-07 NOTE — H&P (Signed)
[facility] History and Physical   Primary Care Physician:  [name], MD   Reason for Procedure:  Screening colonoscopy  Plan:    Colonoscopy The risks and benefits as well as alternatives of endoscopic procedure(s) have been discussed and reviewed. All questions answered. The patient agrees to proceed.    HPI: Jordan [name] is a 69 y.o. female here fopr CRCA screening with colonoscopy   Past Medical History  Diagnosis Date  . HYPERLIPIDEMIA 11/29/2009  . HYPERTENSION 11/29/2009  . COLONIC POLYPS 07/11/2010  . CEREBRAL PALSY 07/11/2010    PT CAN TRANSFER BED TO CHAIR--CAN STAND BRIEFLY--BUT NOT ABLE TO WALK; RT LEG HYPEREXTENDS BACKWARD AND IS WEAK-HAS A POWER CHAIR  . Diabetes mellitus     ORAL MED FOR DIABETES  . Blood transfusion 1992    AFTER  SURGERY TO REMOVE BLADDER  . GERD (gastroesophageal reflux disease)     PAST HISTORY GERD--WATCHES DIET - NOT ON ANY MEDS FOR GERD  . Incisional hernia     NO PAIN-NOT CAUSING ANY DISCOMFORT  . Arthritis     OA  . PONV (postoperative nausea and vomiting)   . Presence of urostomy     11-28-14 remains    Past Surgical History  Procedure Laterality Date  . Appendectomy    . Cholecystectomy    . Tonsillectomy    . Revision urostomy cutaneous  1992  . Abdominal hysterectomy  1990    partial  . Radical cystectomy  1992    neurogenic bladder sec CP  . Rt ankle fusion  1975    SEVERAL RT ANKLE SURGERIES PRIOR TO THE FUSION  . Left carpal tunnel release x 2    . Right carpal tunnel release    . Lumbar sympathetectomies x 2    . 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 Nephrolithotomy  10/01/2011    Procedure: NEPHROLITHOTOMY PERCUTANEOUS;  Surgeon: Franchot Gallo, MD;  Location: [facility];  Service: Urology;  Laterality: Right;         Prior to Admission medications   Medication Sig Start Date End Date Taking? Authorizing Provider  acetaminophen (TYLENOL) 500 MG tablet Take 500 mg by mouth  every 6 (six) hours as needed for mild pain. Pain    Yes Historical Provider, MD  aspirin 81 MG tablet Take 81 mg by mouth daily.    Yes Historical Provider, MD  b complex vitamins capsule Take 1 capsule by mouth daily.    Yes Historical Provider, MD  cetirizine (ZYRTEC) 10 MG tablet Take 10 mg by mouth daily as needed. allergies   Yes Historical Provider, MD  Cholecalciferol (VITAMIN D) 2000 UNITS CAPS Take 2,000 Units by mouth See admin instructions. Takes only on days Monday through thursday   Yes Historical Provider, MD  furosemide (LASIX) 80 MG tablet Take 1 tablet (80 mg total) by mouth daily. 07/12/14  Yes Modena Jansky, MD  losartan (COZAAR) 100 MG tablet TAKE ONE TABLET BY MOUTH ONE TIME DAILY  07/17/14  Yes [name], MD  metFORMIN (GLUCOPHAGE) 500 MG tablet Take 2 tablets by mouth in the morning and 2 tablets every evening. 06/29/14  Yes [name], MD  Multiple Vitamin (MULTIVITAMIN) capsule Take 1 capsule by mouth daily.    Yes Historical Provider, MD  Omega-3 Fatty Acids (FISH OIL) 1200 MG CAPS Take 1,200 mg by mouth 2 (two) times daily.    Yes Historical Provider, MD  polyethylene glycol powder (GLYCOLAX/MIRALAX) powder  Take 17 g by mouth daily. One capfull daily by mouth with liquid   Yes Historical Provider, MD  simvastatin (ZOCOR) 40 MG tablet TAKE ONE TABLET BY MOUTH ONE TIME DAILY 03/22/14  Yes [name], MD  sodium chloride (OCEAN) 0.65 % nasal spray Place 1-2 sprays into the nose as needed. Allergies    Yes Historical Provider, MD  ACCU-CHEK FASTCLIX LANCETS MISC Use to check blood sugar once a day 11/03/14   [name], MD  glucose blood (ACCU-CHEK SMARTVIEW) test strip Test twice daily E11.9 05/18/14   [name], MD  MOVIPREP 100 G SOLR Use per prep instructions 10/17/14   Gatha Mayer, MD    Current Facility-Administered Medications  Medication Dose Route Frequency Provider Last Rate Last Dose  . 0.9 %  sodium chloride infusion    Intravenous Continuous Gatha Mayer, MD      . lactated ringers infusion   Intravenous Continuous Gatha Mayer, MD 10 mL/hr at 12/07/14 0849 1,000 mL at 12/07/14 0849    Allergies as of 10/17/2014 - Review Complete 10/17/2014  Allergen Reaction Noted  . Morphine Anaphylaxis   . Baclofen  01/07/2011  . Lisinopril Cough 06/03/2011  . Metoclopramide hcl    . Penicillins      Family History  Problem Relation Age of Onset  . Heart disease Mother 64    COPD  . Heart disease Father     COPD   . Emphysema Father   . Colon cancer Father 92    Died at 43  . Bladder Cancer Father   . Colon polyps Sister   . Esophageal cancer Neg Hx   . Kidney disease Neg Hx     History   Social History  . Marital Status: Married    Spouse Name: N/A  . Number of Children: 1  . Years of Education: N/A   Occupational History  . Counselor; [employer]    Social History Main Topics  . Smoking status: Never Smoker   . Smokeless tobacco: Never Used  . Alcohol Use: No  . Drug Use: No  . Sexual Activity: Not on file   Other Topics Concern  . 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  Physical Exam: Vital signs in last 24 hours: Temp:  [98.2 F (36.8 C)] 98.2 F (36.8 C) (06/23 0830) Resp:  [16] 16 (06/23 0830) BP: (127)/(40) 127/40 mmHg (06/23 0830) SpO2:  [98 %] 98 % (06/23 0830) Weight:  [178 lb (80.74 kg)] 178 lb (80.74 kg) (06/23 0830)   General:   Alert,  Well-developed, well-nourished, pleasant and cooperative in NAD Lungs:  Clear throughout to auscultation.   Heart:  Regular rate and rhythm; no murmurs, clicks, rubs,  or gallops. Abdomen:  Soft, nontender and nondistended. Normal bowel sounds.  uertrostomy in LLQ Neuro/Psych:  Alert and cooperative. Normal mood and affect. A and O x 3   @Mahki Spikes  Simonne Maffucci,  MD, [facility] [phone] (pager) 12/07/2014 9:20 AM@

## 2014-12-07 NOTE — Op Note (Addendum)
[facility] [place], [address]   COLONOSCOPY PROCEDURE REPORT  PATIENT: Jordan [name], Jordan [name]  MR#: [id number] BIRTHDATE: 11-Oct-1945 , 48  yrs. old GENDER: female ENDOSCOPIST: Gatha Mayer, MD, [facility] PROCEDURE DATE:  12/07/2014 PROCEDURE:   Colonoscopy, surveillance First Screening Colonoscopy - Avg.  risk and is 50 yrs.  old or older - No.  Prior Negative Screening - Now for repeat screening. N/A  History of Adenoma - Now for follow-up colonoscopy & has been > or = to 3 yrs.  Yes hx of adenoma.  Has been 3 or more years since last colonoscopy.  Polyps removed today? No Polyps removed today? No ASA CLASS:   Class III INDICATIONS:Surveillance due to prior colonic neoplasia and PH Colon Adenoma. MEDICATIONS: Monitored anesthesia care and Per Anesthesia  DESCRIPTION OF PROCEDURE:   After the risks benefits and alternatives of the procedure were thoroughly explained, informed consent was obtained.  The digital rectal exam revealed no abnormalities of the rectum.   The Pentax Adult Colon [id number] endoscope was introduced through the anus and advanced to the cecum, which was identified by both the appendix and ileocecal valve. No adverse events experienced.   The quality of the prep was (MoviPrep was used) good.  The instrument was then slowly withdrawn as the colon was fully examined. Estimated blood loss is zero unless otherwise noted in this procedure report.  COLON FINDINGS: Two sessile polyps ranging from 3 to 68mm in size were found in the sigmoid colon.  Polypectomies were performed with a cold snare.  The resection was complete, the polyp tissue was completely retrieved and sent to histology.   The colon was redundant.  The patient was moved on to their back to reach the cecum.  Manual abdominal counter-pressure was used to reach the cecum and biopsy forcep manipulation.  The examination was otherwise normal.  Retroflexed views revealed no  abnormalities. The time to cecum = 14.3 Withdrawal time = 12.8   The scope was withdrawn and the procedure completed. COMPLICATIONS: There were no immediate complications.  ENDOSCOPIC IMPRESSION: Two sessile polyps ranging from 3 to 54mm in size were found in the sigmoid colon; polypectomies were performed with a cold snare redundnat otherwise normal colon - good prep  RECOMMENDATIONS: 1.  Await pathology results 2.  Consider interval cologuard vs colonoscopy given redundant colon   eSigned:  Gatha Mayer, MD, [facility] 12/07/2014 10:30 AM Revised: 12/07/2014 10:30 AM  cc: Carolann Littler, MD and The Patient

## 2014-12-07 NOTE — Anesthesia Postprocedure Evaluation (Signed)
  Anesthesia Post-op Note  Patient: Jordan [name]  Procedure(s) Performed: Procedure(s) (LRB): COLONOSCOPY WITH PROPOFOL (N/A)  Patient Location: PACU  Anesthesia Type: MAC  Level of Consciousness: awake and alert   Airway and Oxygen Therapy: Patient Spontanous Breathing  Post-op Pain: mild  Post-op Assessment: Post-op Vital signs reviewed, Patient's Cardiovascular Status Stable, Respiratory Function Stable, Patent Airway and No signs of Nausea or vomiting  Last Vitals:  Filed Vitals:   12/07/14 1030  BP: 122/66  Temp:   Resp:     Post-op Vital Signs: stable   Complications: No apparent anesthesia complications

## 2014-12-07 NOTE — Anesthesia Preprocedure Evaluation (Addendum)
Anesthesia Evaluation  Patient identified by MRN, date of birth, ID band Patient awake    Reviewed: Allergy & Precautions, NPO status , Patient's Chart, lab work & pertinent test results  History of Anesthesia Complications (+) PONV and history of anesthetic complications  Airway Mallampati: II  TM Distance: >3 FB Neck ROM: Full    Dental no notable dental hx.    Pulmonary neg pulmonary ROS,  breath sounds clear to auscultation  Pulmonary exam normal       Cardiovascular Exercise Tolerance: Good hypertension, Pt. on medications Normal cardiovascular examRhythm:Regular Rate:Normal     Neuro/Psych Cerebral palsy negative psych ROS   GI/Hepatic Neg liver ROS, GERD-  ,  Endo/Other  diabetes, Type 2, Oral Hypoglycemic Agents  Renal/GU Renal disease  negative genitourinary   Musculoskeletal  (+) Arthritis -,   Abdominal   Peds negative pediatric ROS (+)  Hematology negative hematology ROS (+)   Anesthesia Other Findings   Reproductive/Obstetrics negative OB ROS                            Anesthesia Physical Anesthesia Plan  ASA: III  Anesthesia Plan: MAC   Post-op Pain Management:    Induction: Intravenous  Airway Management Planned:   Additional Equipment:   Intra-op Plan:   Post-operative Plan: Extubation in OR  Informed Consent: I have reviewed the patients History and Physical, chart, labs and discussed the procedure including the risks, benefits and alternatives for the proposed anesthesia with the patient or authorized representative who has indicated his/her understanding and acceptance.   Dental advisory given  Plan Discussed with: CRNA  Anesthesia Plan Comments:         Anesthesia Quick Evaluation

## 2014-12-07 NOTE — Discharge Instructions (Addendum)
I found and removed 2 small polyps. All else ok though you have a long floppy colon.  I will let you know pathology results and when to have another routine colonoscopy by mail.  I appreciate the opportunity to care for you. Gatha Mayer, MD, FACG  YOU HAD AN ENDOSCOPIC PROCEDURE TODAY: Refer to the procedure report and other information in the discharge instructions given to you for any specific questions about what was found during the examination. If this information does not answer your questions, please call Dr. Celesta Aver office at [phone] to clarify.   YOU SHOULD EXPECT: Some feelings of bloating in the abdomen. Passage of more gas than usual. Walking can help get rid of the air that was put into your GI tract during the procedure and reduce the bloating. If you had a lower endoscopy (such as a colonoscopy or flexible sigmoidoscopy) you may notice spotting of blood in your stool or on the toilet paper. Some abdominal soreness may be present for a day or two, also.  DIET: Your first meal following the procedure should be a light meal and then it is ok to progress to your normal diet. A half-sandwich or bowl of soup is an example of a good first meal. Heavy or fried foods are harder to digest and may make you feel nauseous or bloated. Drink plenty of fluids but you should avoid alcoholic beverages for 24 hours.   ACTIVITY: Your care partner should take you home directly after the procedure. You should plan to take it easy, moving slowly for the rest of the day. You can resume normal activity the day after the procedure however YOU SHOULD NOT DRIVE, use power tools, machinery or perform tasks that involve climbing or major physical exertion for 24 hours (because of the sedation medicines used during the test).   SYMPTOMS TO REPORT IMMEDIATELY: A gastroenterologist can be reached at any hour. Please call [phone]  for any of the following symptoms:  Following lower endoscopy  (colonoscopy, flexible sigmoidoscopy) Excessive amounts of blood in the stool  Significant tenderness, worsening of abdominal pains  Swelling of the abdomen that is new, acute  Fever of 100 or higher    FOLLOW UP:  If any biopsies were taken you will be contacted by phone or by letter within the next 1-3 weeks. Call [phone]  if you have not heard about the biopsies in 3 weeks.  Please also call with any specific questions about appointments or follow up tests.  Monitored Anesthesia Care Monitored anesthesia care is an anesthesia service for a medical procedure. Anesthesia is the loss of the ability to feel pain. It is produced by medicines called anesthetics. It may affect a small area of your body (local anesthesia), a large area of your body (regional anesthesia), or your entire body (general anesthesia). The need for monitored anesthesia care depends your procedure, your condition, and the potential need for regional or general anesthesia. It is often provided during procedures where:   General anesthesia may be needed if there are complications. This is because you need special care when you are under general anesthesia.   You will be under local or regional anesthesia. This is so that you are able to have higher levels of anesthesia if needed.   You will receive calming medicines (sedatives). This is especially the case if sedatives are given to put you in a semi-conscious state of relaxation (deep sedation). This is because the amount of sedative needed to produce  this state can be hard to predict. Too much of a sedative can produce general anesthesia. Monitored anesthesia care is performed by one or more health care providers who have special training in all types of anesthesia. You will need to meet with these health care providers before your procedure. During this meeting, they will ask you about your medical history. They will also give you instructions to follow. (For example,  you will need to stop eating and drinking before your procedure. You may also need to stop or change medicines you are taking.) During your procedure, your health care providers will stay with you. They will:   Watch your condition. This includes watching your blood pressure, breathing, and level of pain.   Diagnose and treat problems that occur.   Give medicines if they are needed. These may include calming medicines (sedatives) and anesthetics.   Make sure you are comfortable.  Having monitored anesthesia care does not necessarily mean that you will be under anesthesia. It does mean that your health care providers will be able to manage anesthesia if you need it or if it occurs. It also means that you will be able to have a different type of anesthesia than you are having if you need it. When your procedure is complete, your health care providers will continue to watch your condition. They will make sure any medicines wear off before you are allowed to go home.  Document Released: 02/26/2005 Document Revised: 10/17/2013 Document Reviewed: 07/14/2012 [facility] Patient Information 2015 [place], [place]. This information is not intended to replace advice given to you by your health care provider. Make sure you discuss any questions you have with your health care provider. Colonoscopy, Care After Refer to this sheet in the next few weeks. These instructions provide you with information on caring for yourself after your procedure. Your health care provider may also give you more specific instructions. Your treatment has been planned according to current medical practices, but problems sometimes occur. Call your health care provider if you have any problems or questions after your procedure. WHAT TO EXPECT AFTER THE PROCEDURE  After your procedure, it is typical to have the following:  A small amount of blood in your stool.  Moderate amounts of gas and mild abdominal cramping or bloating. HOME CARE  INSTRUCTIONS  Do not drive, operate machinery, or sign important documents for 24 hours.  You may shower and resume your regular physical activities, but move at a slower pace for the first 24 hours.  Take frequent rest periods for the first 24 hours.  Walk around or put a warm pack on your abdomen to help reduce abdominal cramping and bloating.  Drink enough fluids to keep your urine clear or pale yellow.  You may resume your normal diet as instructed by your health care provider. Avoid heavy or fried foods that are hard to digest.  Avoid drinking alcohol for 24 hours or as instructed by your health care provider.  Only take over-the-counter or prescription medicines as directed by your health care provider.  If a tissue sample (biopsy) was taken during your procedure:  Do not take aspirin or blood thinners for 7 days, or as instructed by your health care provider.  Do not drink alcohol for 7 days, or as instructed by your health care provider.  Eat soft foods for the first 24 hours. SEEK MEDICAL CARE IF: You have persistent spotting of blood in your stool 2-3 days after the procedure. [place]  IF:  You have more than a small spotting of blood in your stool.  You pass large blood clots in your stool.  Your abdomen is swollen (distended).  You have nausea or vomiting.  You have a fever.  You have increasing abdominal pain that is not relieved with medicine. Document Released: 01/15/2004 Document Revised: 03/23/2013 Document Reviewed: 02/07/2013 [facility] Patient Information 2015 [employer], [place]. This information is not intended to replace advice given to you by your health care provider. Make sure you discuss any questions you have with your health care provider.

## 2014-12-07 NOTE — Transfer of Care (Signed)
Immediate Anesthesia Transfer of Care Note  Patient: Jordan [name]  Procedure(s) Performed: Procedure(s): COLONOSCOPY WITH PROPOFOL (N/A)  Patient Location: PACU  Anesthesia Type:MAC  Level of Consciousness:  sedated, patient cooperative and responds to stimulation  Airway & Oxygen Therapy:Patient Spontanous Breathing and Patient connected to face mask oxgen  Post-op Assessment:  Report given to PACU RN and Post -op Vital signs reviewed and stable  Post vital signs:  Reviewed and stable  Last Vitals:  Filed Vitals:   12/07/14 0830  BP: 127/40  Temp: 36.8 C  Resp: 16    Complications: No apparent anesthesia complications

## 2014-12-08 ENCOUNTER — Encounter (HOSPITAL_COMMUNITY): Payer: Self-pay | Admitting: Internal Medicine

## 2014-12-12 ENCOUNTER — Ambulatory Visit: Payer: Medicare HMO | Admitting: Family Medicine

## 2014-12-13 ENCOUNTER — Encounter: Payer: Self-pay | Admitting: Internal Medicine

## 2014-12-13 DIAGNOSIS — Z86010 Personal history of colonic polyps: Secondary | ICD-10-CM

## 2014-12-13 NOTE — Progress Notes (Signed)
Quick Note:  1 adenoma 1 not Repeat colonoscopy vs fecal DNA testing 2021 ______

## 2014-12-20 ENCOUNTER — Encounter: Payer: Self-pay | Admitting: Family Medicine

## 2014-12-20 ENCOUNTER — Ambulatory Visit (INDEPENDENT_AMBULATORY_CARE_PROVIDER_SITE_OTHER): Payer: Medicare HMO | Admitting: Family Medicine

## 2014-12-20 VITALS — BP 130/70 | HR 70 | Temp 98.4°F

## 2014-12-20 DIAGNOSIS — M79601 Pain in right arm: Secondary | ICD-10-CM | POA: Diagnosis not present

## 2014-12-20 DIAGNOSIS — Z780 Asymptomatic menopausal state: Secondary | ICD-10-CM | POA: Diagnosis not present

## 2014-12-20 DIAGNOSIS — E119 Type 2 diabetes mellitus without complications: Secondary | ICD-10-CM | POA: Diagnosis not present

## 2014-12-20 DIAGNOSIS — I10 Essential (primary) hypertension: Secondary | ICD-10-CM

## 2014-12-20 LAB — HEMOGLOBIN A1C: Hgb A1c MFr Bld: 6.9 % — ABNORMAL HIGH (ref 4.6–6.5)

## 2014-12-20 LAB — HM DIABETES EYE EXAM

## 2014-12-20 LAB — HM DIABETES FOOT EXAM: HM Diabetic Foot Exam: NORMAL

## 2014-12-20 NOTE — Progress Notes (Signed)
Pre visit review using our clinic review tool, if applicable. No additional management support is needed unless otherwise documented below in the visit note.  Pt not able to weigh.

## 2014-12-20 NOTE — Progress Notes (Signed)
Subjective:    Patient ID: Jordan [name], female    DOB: 1946-05-30, 69 y.o.   MRN: [id number]  HPI  Medical follow-up  Type 2 diabetes. Last A1c 7.0%. Fasting blood sugars usually run 120-130. No symptoms of polydipsia or polyuria. Remains on metformin  Hypertension treated with losartan. She also takes furosemide. Blood pressure stable. No dizziness.  New problem of recent right arm pain. Distal right arm. No redness, warmth, edema, or ecchymosis. No reported injury. Symptoms worse at night when she rolls over on it. No right shoulder pain. No right neck pain.  Questions bone density scan. Last was many years ago. No known history of osteopenia or osteoporosis.  Past Medical History  Diagnosis Date  . HYPERLIPIDEMIA 11/29/2009  . HYPERTENSION 11/29/2009  . COLONIC POLYPS 07/11/2010  . CEREBRAL PALSY 07/11/2010    PT CAN TRANSFER BED TO CHAIR--CAN STAND BRIEFLY--BUT NOT ABLE TO WALK; RT LEG HYPEREXTENDS BACKWARD AND IS WEAK-HAS A POWER CHAIR  . Diabetes mellitus     ORAL MED FOR DIABETES  . Blood transfusion 1992    AFTER  SURGERY TO REMOVE BLADDER  . GERD (gastroesophageal reflux disease)     PAST HISTORY GERD--WATCHES DIET - NOT ON ANY MEDS FOR GERD  . Incisional hernia     NO PAIN-NOT CAUSING ANY DISCOMFORT  . Arthritis     OA  . PONV (postoperative nausea and vomiting)   . Presence of urostomy     11-28-14 remains   Past Surgical History  Procedure Laterality Date  . Appendectomy    . Cholecystectomy    . Tonsillectomy    . Revision urostomy cutaneous  1992  . Abdominal hysterectomy  1990    partial  . Radical cystectomy  1992    neurogenic bladder sec CP  . Rt ankle fusion  1975    SEVERAL RT ANKLE SURGERIES PRIOR TO THE FUSION  . Left carpal tunnel release x 2    . Right carpal tunnel release    . Lumbar sympathetectomies x 2    . 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 Nephrolithotomy  10/01/2011    Procedure: NEPHROLITHOTOMY PERCUTANEOUS;   Surgeon: Franchot Gallo, MD;  Location: [facility];  Service: Urology;  Laterality: Right;       . Colonoscopy with propofol N/A 12/07/2014    Procedure: COLONOSCOPY WITH PROPOFOL;  Surgeon: Gatha Mayer, MD;  Location: [facility];  Service: Endoscopy;  Laterality: N/A;    reports that she has never smoked. She has never used smokeless tobacco. She reports that she does not drink alcohol or use illicit drugs. family history includes Bladder Cancer in her father; Colon cancer (age of onset: 80) in her father; Colon polyps in her sister; Emphysema in her father; Heart disease in her father; Heart disease (age of onset: 77) in her mother. There is no history of Esophageal cancer or Kidney disease. Allergies  Allergen Reactions  . Morphine Anaphylaxis  . Baclofen     GI upset  . Lisinopril Cough  . Metoclopramide Hcl     Reglan, irritable  . Penicillins     " MAJOR CASE OF HIVES"     Review of Systems  Constitutional: Negative for fatigue.  Eyes: Negative for visual disturbance.  Respiratory: Negative for cough, chest tightness, shortness of breath and wheezing.   Cardiovascular: Negative for chest pain, palpitations and leg swelling.  Neurological: Negative for dizziness, seizures, syncope, weakness, light-headedness and headaches.  Objective:   Physical Exam  Constitutional: She appears well-developed and well-nourished. No distress.  Neck: Neck supple. No thyromegaly present.  Cardiovascular: Normal rate and regular rhythm.   Pulmonary/Chest: Effort normal and breath sounds normal. No respiratory distress. She has no wheezes. She has no rales.  Musculoskeletal: She exhibits edema.  Trace edema legs bilaterally  Neurological: She is alert.          Assessment & Plan:  #1 type 2 diabetes. History of recent fair control. Recheck A1c. She is encouraged to lose some weight #2 hypertension. Stable and well controlled #3 right arm pain. Nonfocal exam. We recommend  observation and if not improving over the next couple weeks consider plain films as a starting point #4 postmenopausal. Schedule DEXA scan. We discussed adequate calcium and vitamin D

## 2014-12-24 ENCOUNTER — Other Ambulatory Visit: Payer: Self-pay | Admitting: Family Medicine

## 2015-01-11 ENCOUNTER — Other Ambulatory Visit: Payer: Self-pay | Admitting: Family Medicine

## 2015-01-11 MED ORDER — METFORMIN HCL 500 MG PO TABS: ORAL_TABLET | ORAL | Status: DC

## 2015-02-14 LAB — HM DIABETES EYE EXAM

## 2015-02-27 ENCOUNTER — Other Ambulatory Visit: Payer: Self-pay | Admitting: Family Medicine

## 2015-04-02 ENCOUNTER — Ambulatory Visit (INDEPENDENT_AMBULATORY_CARE_PROVIDER_SITE_OTHER): Payer: Medicare HMO | Admitting: Family Medicine

## 2015-04-02 ENCOUNTER — Encounter: Payer: Self-pay | Admitting: Family Medicine

## 2015-04-02 VITALS — BP 122/68 | HR 82 | Temp 99.4°F

## 2015-04-02 DIAGNOSIS — J069 Acute upper respiratory infection, unspecified: Secondary | ICD-10-CM

## 2015-04-02 MED ORDER — BENZONATATE 200 MG PO CAPS: 200.0000 mg | ORAL_CAPSULE | Freq: Two times a day (BID) | ORAL | Status: DC | PRN

## 2015-04-02 NOTE — Progress Notes (Signed)
Pre visit review using our clinic review tool, if applicable. No additional management support is needed unless otherwise documented below in the visit note. Pt unable to weigh 

## 2015-04-02 NOTE — Progress Notes (Signed)
   Subjective:    Patient ID: Jordan [name], female    DOB: [birth year], 70 y.o.   MRN: [id number]  HPI Here for 3 days of fever, runny nose, stuffy head, PND, and a dry cough. Drinking fluids and taking Tylenol. The fever was 101 degrees at first but this has been steadily coming down.    Review of Systems  Constitutional: Positive for fever.  HENT: Positive for congestion and postnasal drip.   Eyes: Negative.   Respiratory: Positive for cough. Negative for shortness of breath.   Cardiovascular: Negative.        Objective:   Physical Exam  Constitutional: She appears well-developed and well-nourished.  HENT:  Right Ear: External ear normal.  Left Ear: External ear normal.  Nose: Nose normal.  Mouth/Throat: Oropharynx is clear and moist.  Eyes: Conjunctivae are normal.  Neck: No thyromegaly present.  Cardiovascular: Normal rate, regular rhythm, normal heart sounds and intact distal pulses.   Pulmonary/Chest: Effort normal and breath sounds normal. No respiratory distress. She has no wheezes.  Lymphadenopathy:    She has no cervical adenopathy.          Assessment & Plan:  Viral URI. Use Benzonatate for cough. Add Mucinex. Recheck prn

## 2015-05-14 ENCOUNTER — Encounter: Payer: Self-pay | Admitting: Family Medicine

## 2015-05-14 ENCOUNTER — Ambulatory Visit (INDEPENDENT_AMBULATORY_CARE_PROVIDER_SITE_OTHER): Payer: Medicare HMO | Admitting: Family Medicine

## 2015-05-14 ENCOUNTER — Ambulatory Visit
Admission: RE | Admit: 2015-05-14 | Discharge: 2015-05-14 | Disposition: A | Discharge status: Home / Self Care | Payer: Medicare HMO | Source: Ambulatory Visit | Attending: Family Medicine | Admitting: Family Medicine

## 2015-05-14 VITALS — BP 120/66 | HR 80 | Temp 98.6°F | Resp 14

## 2015-05-14 DIAGNOSIS — M25521 Pain in right elbow: Secondary | ICD-10-CM

## 2015-05-14 DIAGNOSIS — Z23 Encounter for immunization: Secondary | ICD-10-CM

## 2015-05-14 MED ORDER — DICLOFENAC SODIUM 3 % TD GEL: 1.0000 "application " | Freq: Two times a day (BID) | TRANSDERMAL | Status: DC

## 2015-05-14 NOTE — Progress Notes (Signed)
Subjective:    Patient ID: Jordan [name], female    DOB: 10-Aug-1945, 69 y.o.   MRN: [id number]  HPI Persistent right elbow pain Onset around July of this year. Denies specific injury. Poorly localized pain. Mostly medial epicondylar region and distal triceps No associated swelling, erythema, or bruising. Right-hand dominant. Uses her right upper extremity significantly for steering knob for driving and also does lots of needing Worse with activities. No pain at rest. Heat helps. Has not tried any icing.  Past Medical History  Diagnosis Date  . HYPERLIPIDEMIA 11/29/2009  . HYPERTENSION 11/29/2009  . COLONIC POLYPS 07/11/2010  . CEREBRAL PALSY 07/11/2010    PT CAN TRANSFER BED TO CHAIR--CAN STAND BRIEFLY--BUT NOT ABLE TO WALK; RT LEG HYPEREXTENDS BACKWARD AND IS WEAK-HAS A POWER CHAIR  . Diabetes mellitus     ORAL MED FOR DIABETES  . Blood transfusion 1992    AFTER  SURGERY TO REMOVE BLADDER  . GERD (gastroesophageal reflux disease)     PAST HISTORY GERD--WATCHES DIET - NOT ON ANY MEDS FOR GERD  . Incisional hernia     NO PAIN-NOT CAUSING ANY DISCOMFORT  . Arthritis     OA  . PONV (postoperative nausea and vomiting)   . Presence of urostomy [facility])     11-28-14 remains   Past Surgical History  Procedure Laterality Date  . Appendectomy    . Cholecystectomy    . Tonsillectomy    . Revision urostomy cutaneous  1992  . Abdominal hysterectomy  1990    partial  . Radical cystectomy  1992    neurogenic bladder sec CP  . Rt ankle fusion  1975    SEVERAL RT ANKLE SURGERIES PRIOR TO THE FUSION  . Left carpal tunnel release x 2    . Right carpal tunnel release    . Lumbar sympathetectomies x 2    . 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 Nephrolithotomy  10/01/2011    Procedure: NEPHROLITHOTOMY PERCUTANEOUS;  Surgeon: Franchot Gallo, MD;  Location: [facility];  Service: Urology;  Laterality: Right;       . Colonoscopy with propofol N/A 12/07/2014    Procedure:  COLONOSCOPY WITH PROPOFOL;  Surgeon: Gatha Mayer, MD;  Location: [facility];  Service: Endoscopy;  Laterality: N/A;    reports that she has never smoked. She has never used smokeless tobacco. She reports that she does not drink alcohol or use illicit drugs. family history includes Bladder Cancer in her father; Colon cancer (age of onset: 52) in her father; Colon polyps in her sister; Emphysema in her father; Heart disease in her father; Heart disease (age of onset: 53) in her mother. There is no history of Esophageal cancer or Kidney disease. Allergies  Allergen Reactions  . Morphine Anaphylaxis  . Baclofen     GI upset  . Lisinopril Cough  . Metoclopramide Hcl     Reglan, irritable  . Penicillins     " MAJOR CASE OF HIVES"      Review of Systems  Constitutional: Negative for fever and chills.  Neurological: Negative for weakness and numbness.       Objective:   Physical Exam  Constitutional: She appears well-developed and well-nourished.  Cardiovascular: Normal rate and regular rhythm.   Pulmonary/Chest: Effort normal and breath sounds normal. No respiratory distress. She has no wheezes. She has no rales.  Musculoskeletal:  Right elbow no visible swelling. No ecchymosis. Full range of motion. No pain with suppuration  or pronation. She has some mild tenderness medial epicondylar region and also distal triceps. No lateral epicondylar tenderness. Mild pain with wrist flexion against resistance          Assessment & Plan:  Right elbow pain. Suspect element of medial epicondylitis and triceps tendinitis. Given duration of symptoms obtain x-rays to further assess. Recommend icing regularly 2-3 times daily 15-20 minutes. Consider diclofenac topical gel 2-3 times daily. Handout given on medial epicondylitis

## 2015-05-14 NOTE — Patient Instructions (Signed)
Medial Epicondylitis With Rehab Medial epicondylitis involves inflammation and pain around the inner (medial) portion of the elbow. This pain is caused by inflammation of the tendons in the forearm that flex (bring down) the wrist. Medial epicondylitis is also called golfer's elbow, because it is common among golfers. However, it may occur in any individual who flexes the wrist regularly. If medial epicondylitis is left untreated, it may become a chronic problem. SYMPTOMS   Pain, tenderness, or inflammation over the inner (medial) side of the elbow.  Pain or weakness with gripping activities.  Pain that increases with wrist twisting motions (using a screwdriver, playing golf, bowling). CAUSES  Medial epicondylitis is caused by inflammation of the tendons that flex the wrist. Causes of injury may include:  Chronic, repetitive stress and strain to the tendons that run from the wrist and forearm to the elbow.  Sudden strain on the forearm, including wrist snap when serving balls with racquet sports, or throwing a baseball. RISK INCREASES WITH:  Sports or occupations that require repetitive and/or strenuous forearm and wrist movements (pitching a baseball, golfing, carpentry).  Poor wrist and forearm strength and flexibility.  Failure to warm up properly before activity.  Resuming activity before healing, rehabilitation, and conditioning are complete. PREVENTION   Warm up and stretch properly before activity.  Maintain physical fitness:  Strength, flexibility, and endurance.  Cardiovascular fitness.  Wear and use properly fitted equipment.  Learn and use proper technique and have a coach correct improper technique.  Wear a tennis elbow (counterforce) brace. PROGNOSIS  The course of this condition depends on the degree of the injury. If treated properly, acute cases (symptoms lasting less than 4 weeks) are often resolved in 2 to 6 weeks. Chronic (longer lasting cases) often resolve  in 3 to 6 months, but may require physical therapy. RELATED COMPLICATIONS   Frequently recurring symptoms, resulting in a chronic problem. Properly treating the problem the first time decreases frequency of recurrence.  Chronic inflammation, scarring, and partial tendon tear, requiring surgery.  Delayed healing or resolution of symptoms. TREATMENT  Treatment first involves the use of ice and medicine, to reduce pain and inflammation. Strengthening and stretching exercises may reduce discomfort, if performed regularly. These exercises may be performed at home, if the condition is an acute injury. Chronic cases may require a referral to a physical therapist for evaluation and treatment. Your caregiver may advise a corticosteroid injection to help reduce inflammation. Rarely, surgery is needed. MEDICATION  If pain medicine is needed, nonsteroidal anti-inflammatory medicines (aspirin and ibuprofen), or other minor pain relievers (acetaminophen), are often advised.  Do not take pain medicine for 7 days before surgery.  Prescription pain relievers may be given, if your caregiver thinks they are needed. Use only as directed and only as much as you need.  Corticosteroid injections may be recommended. These injections should be reserved only for the most severe cases, because they can only be given a certain number of times. HEAT AND COLD  Cold treatment (icing) should be applied for 10 to 15 minutes every 2 to 3 hours for inflammation and pain, and immediately after activity that aggravates your symptoms. Use ice packs or an ice massage.  Heat treatment may be used before performing stretching and strengthening activities prescribed by your caregiver, physical therapist, or athletic trainer. Use a heat pack or a warm water soak. SEEK MEDICAL CARE IF: Symptoms get worse or do not improve in 2 weeks, despite treatment. EXERCISES  RANGE OF MOTION (ROM) AND   STRETCHING EXERCISES - Epicondylitis, Medial  (Golfer's Elbow) These exercises may help you when beginning to rehabilitate your injury. Your symptoms may go away with or without further involvement from your physician, physical therapist or athletic trainer. While completing these exercises, remember:   Restoring tissue flexibility helps normal motion to return to the joints. This allows healthier, less painful movement and activity.  An effective stretch should be held for at least 30 seconds.  A stretch should never be painful. You should only feel a gentle lengthening or release in the stretched tissue. RANGE OF MOTION - Wrist Flexion, Active-Assisted  Extend your right / left elbow with your fingers pointing down.*  Gently pull the back of your hand towards you, until you feel a gentle stretch on the top of your forearm.  Hold this position for __________ seconds. Repeat __________ times. Complete this exercise __________ times per day.  *If directed by your physician, physical therapist or athletic trainer, complete this stretch with your elbow bent, rather than extended. RANGE OF MOTION - Wrist Extension, Active-Assisted  Extend your right / left elbow and turn your palm upwards.*  Gently pull your palm and fingertips back, so your wrist extends and your fingers point more toward the ground.  You should feel a gentle stretch on the inside of your forearm.  Hold this position for __________ seconds. Repeat __________ times. Complete this exercise __________ times per day. *If directed by your physician, physical therapist or athletic trainer, complete this stretch with your elbow bent, rather than extended. STRETCH - Wrist Extension   Place your right / left fingertips on a tabletop leaving your elbow slightly bent. Your fingers should point backwards.  Gently press your fingers and palm down onto the table, by straightening your elbow. You should feel a stretch on the inside of your forearm.  Hold this position for  __________ seconds. Repeat __________ times. Complete this stretch __________ times per day.  STRENGTHENING EXERCISES - Epicondylitis, Medial (Golfer's Elbow) These exercises may help you when beginning to rehabilitate your injury. They may resolve your symptoms with or without further involvement from your physician, physical therapist or athletic trainer. While completing these exercises, remember:   Muscles can gain both the endurance and the strength needed for everyday activities through controlled exercises.  Complete these exercises as instructed by your physician, physical therapist or athletic trainer. Increase the resistance and repetitions only as guided.  You may experience muscle soreness or fatigue, but the pain or discomfort you are trying to eliminate should never worsen during these exercises. If this pain does get worse, stop and make sure you are following the directions exactly. If the pain is still present after adjustments, discontinue the exercise until you can discuss the trouble with your caregiver. STRENGTH - Wrist Flexors  Sit with your right / left forearm palm-up, and fully supported on a table or countertop. Your elbow should be resting below the height of your shoulder. Allow your wrist to extend over the edge of the surface.  Loosely holding a __________ weight, or a piece of rubber exercise band or tubing, slowly curl your hand up toward your forearm.  Hold this position for __________ seconds. Slowly lower the wrist back to the starting position in a controlled manner. Repeat __________ times. Complete this exercise __________ times per day.  STRENGTH - Wrist Extensors  Sit with your right / left forearm palm-down and fully supported. Your elbow should be resting below the height of your shoulder. Allow your   wrist to extend over the edge of the surface.  Loosely holding a __________ weight, or a piece of rubber exercise band or tubing, slowly curl your hand up  toward your forearm.  Hold this position for __________ seconds. Slowly lower the wrist back to the starting position in a controlled manner. Repeat __________ times. Complete this exercise __________ times per day.  STRENGTH - Ulnar Deviators  Stand with a ____________________ weight in your right / left hand, or sit while holding a rubber exercise band or tubing, with your healthy arm supported on a table or countertop.  Move your wrist so that your pinkie travels toward your forearm and your thumb moves away from your forearm.  Hold this position for __________ seconds and then slowly lower the wrist back to the starting position. Repeat __________ times. Complete this exercise __________ times per day STRENGTH - Grip   Grasp a tennis ball, a dense sponge, or a large, rolled sock in your hand.  Squeeze as hard as you can, without increasing any pain.  Hold this position for __________ seconds. Release your grip slowly. Repeat __________ times. Complete this exercise __________ times per day.  STRENGTH - Forearm Supinators   Sit with your right / left forearm supported on a table, keeping your elbow below shoulder height. Rest your hand over the edge, palm down.  Gently grip a hammer or a soup ladle.  Without moving your elbow, slowly turn your palm and hand upward to a "thumbs-up" position.  Hold this position for __________ seconds. Slowly return to the starting position. Repeat __________ times. Complete this exercise __________ times per day.  STRENGTH - Forearm Pronators  Sit with your right / left forearm supported on a table, keeping your elbow below shoulder height. Rest your hand over the edge, palm up.  Gently grip a hammer or a soup ladle.  Without moving your elbow, slowly turn your palm and hand upward to a "thumbs-up" position.  Hold this position for __________ seconds. Slowly return to the starting position. Repeat __________ times. Complete this exercise  __________ times per day.    This information is not intended to replace advice given to you by your health care provider. Make sure you discuss any questions you have with your health care provider.   Document Released: 06/02/2005 Document Revised: 06/23/2014 Document Reviewed: 09/14/2008 Elsevier Interactive Patient Education 2016 [employer] Inc.  

## 2015-05-14 NOTE — Progress Notes (Signed)
Pre visit review using our clinic review tool, if applicable. No additional management support is needed unless otherwise documented below in the visit note. 

## 2015-05-29 ENCOUNTER — Other Ambulatory Visit: Payer: Self-pay | Admitting: Family Medicine

## 2015-06-12 ENCOUNTER — Other Ambulatory Visit: Payer: Self-pay | Admitting: Family Medicine

## 2015-06-12 MED ORDER — ACCU-CHEK FASTCLIX LANCETS MISC: Status: DC

## 2015-06-12 MED ORDER — SIMVASTATIN 40 MG PO TABS: 40.0000 mg | ORAL_TABLET | Freq: Every day | ORAL | Status: DC

## 2015-06-12 MED ORDER — FUROSEMIDE 80 MG PO TABS: 80.0000 mg | ORAL_TABLET | Freq: Two times a day (BID) | ORAL | Status: DC

## 2015-06-12 MED ORDER — LOSARTAN POTASSIUM 100 MG PO TABS: 100.0000 mg | ORAL_TABLET | Freq: Every day | ORAL | Status: DC

## 2015-06-12 MED ORDER — METFORMIN HCL 500 MG PO TABS: ORAL_TABLET | ORAL | Status: DC

## 2015-06-12 MED ORDER — GLUCOSE BLOOD VI STRP: ORAL_STRIP | Status: DC

## 2015-06-13 ENCOUNTER — Encounter: Payer: Self-pay | Admitting: Family Medicine

## 2015-06-13 ENCOUNTER — Ambulatory Visit (INDEPENDENT_AMBULATORY_CARE_PROVIDER_SITE_OTHER): Payer: Medicare HMO | Admitting: Family Medicine

## 2015-06-13 VITALS — BP 140/76 | HR 81 | Temp 97.3°F | Resp 16

## 2015-06-13 DIAGNOSIS — E785 Hyperlipidemia, unspecified: Secondary | ICD-10-CM

## 2015-06-13 DIAGNOSIS — I10 Essential (primary) hypertension: Secondary | ICD-10-CM | POA: Diagnosis not present

## 2015-06-13 DIAGNOSIS — E119 Type 2 diabetes mellitus without complications: Secondary | ICD-10-CM | POA: Diagnosis not present

## 2015-06-13 LAB — BASIC METABOLIC PANEL
BUN: 36 mg/dL — AB (ref 6–23)
CO2: 23 mEq/L (ref 19–32)
CREATININE: 0.99 mg/dL (ref 0.40–1.20)
Calcium: 10 mg/dL (ref 8.4–10.5)
Chloride: 106 mEq/L (ref 96–112)
GFR: 58.76 mL/min — AB (ref >60.00)
Glucose, Bld: 186 mg/dL — ABNORMAL HIGH (ref 70–99)
Potassium: 4 mEq/L (ref 3.5–5.1)
Sodium: 140 mEq/L (ref 135–145)

## 2015-06-13 LAB — LIPID PANEL
CHOLESTEROL: 151 mg/dL (ref 0–200)
HDL: 39.4 mg/dL (ref >39.00)
NonHDL: 111.11
Total CHOL/HDL Ratio: 4
Triglycerides: 257 mg/dL — ABNORMAL HIGH (ref 0.0–149.0)
VLDL: 51.4 mg/dL — ABNORMAL HIGH (ref 0.0–40.0)

## 2015-06-13 LAB — HEPATIC FUNCTION PANEL
ALK PHOS: 87 U/L (ref 39–117)
ALT: 25 U/L (ref 0–35)
AST: 15 U/L (ref 0–37)
Albumin: 4.1 g/dL (ref 3.5–5.2)
BILIRUBIN DIRECT: 0 mg/dL (ref 0.0–0.3)
TOTAL PROTEIN: 7.2 g/dL (ref 6.0–8.3)
Total Bilirubin: 0.3 mg/dL (ref 0.2–1.2)

## 2015-06-13 LAB — LDL CHOLESTEROL, DIRECT: Direct LDL: 85 mg/dL

## 2015-06-13 LAB — HEMOGLOBIN A1C: Hgb A1c MFr Bld: 6.4 % (ref 4.6–6.5)

## 2015-06-13 NOTE — Patient Instructions (Signed)
Monitor blood pressure and be in touch if consistently > 140/90.   

## 2015-06-13 NOTE — Progress Notes (Signed)
Pre visit review using our clinic review tool, if applicable. No additional management support is needed unless otherwise documented below in the visit note. 

## 2015-06-13 NOTE — Progress Notes (Signed)
Subjective:    Patient ID: Jordan [name], female    DOB: [birth year], 69 y.o.   MRN: [id number]  HPI Patient here for medical follow-up  Type 2 diabetes. Last A1c 6.9%. Recent fasting blood sugars mostly ranging 125 to 140 Patient denies any polyuria or polydipsia. No hypoglycemia.  Hyperlipidemia. No recent lipids. She remains on simvastatin. No history of recent chest pains. No history of CAD.  Hypertension. She monitors infrequently. Blood pressure stable by home readings. She has some chronic peripheral edema which is probably largely venous stasis. She does take furosemide and also does compression hose regularly. No recent edema issues. No orthopnea.  Past Medical History  Diagnosis Date  . HYPERLIPIDEMIA 11/29/2009  . HYPERTENSION 11/29/2009  . COLONIC POLYPS 07/11/2010  . CEREBRAL PALSY 07/11/2010    PT CAN TRANSFER BED TO CHAIR--CAN STAND BRIEFLY--BUT NOT ABLE TO WALK; RT LEG HYPEREXTENDS BACKWARD AND IS WEAK-HAS A POWER CHAIR  . Diabetes mellitus     ORAL MED FOR DIABETES  . Blood transfusion 1992    AFTER  SURGERY TO REMOVE BLADDER  . GERD (gastroesophageal reflux disease)     PAST HISTORY GERD--WATCHES DIET - NOT ON ANY MEDS FOR GERD  . Incisional hernia     NO PAIN-NOT CAUSING ANY DISCOMFORT  . Arthritis     OA  . PONV (postoperative nausea and vomiting)   . Presence of urostomy [facility])     11-28-14 remains   Past Surgical History  Procedure Laterality Date  . Appendectomy    . Cholecystectomy    . Tonsillectomy    . Revision urostomy cutaneous  1992  . Abdominal hysterectomy  1990    partial  . Radical cystectomy  1992    neurogenic bladder sec CP  . Rt ankle fusion  1975    SEVERAL RT ANKLE SURGERIES PRIOR TO THE FUSION  . Left carpal tunnel release x 2    . Right carpal tunnel release    . Lumbar sympathetectomies x 2    . 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 Nephrolithotomy  10/01/2011    Procedure: NEPHROLITHOTOMY PERCUTANEOUS;  Surgeon:  Franchot Gallo, MD;  Location: [facility];  Service: Urology;  Laterality: Right;       . Colonoscopy with propofol N/A 12/07/2014    Procedure: COLONOSCOPY WITH PROPOFOL;  Surgeon: Gatha Mayer, MD;  [facility];  Service: Endoscopy;  Laterality: N/A;    reports that she has never smoked. She has never used smokeless tobacco. She reports that she does not drink alcohol or use illicit drugs. family history includes Bladder Cancer in her father; Colon cancer (age of onset: 37) in her father; Colon polyps in her sister; Emphysema in her father; Heart disease in her father; Heart disease (age of onset: 55) in her mother. There is no history of Esophageal cancer or Kidney disease. Allergies  Allergen Reactions  . Morphine Anaphylaxis  . Baclofen     GI upset  . Lisinopril Cough  . Metoclopramide Hcl     Reglan, irritable  . Penicillins     " MAJOR CASE OF HIVES"      Review of Systems  Constitutional: Negative for fatigue and unexpected weight change.  Eyes: Negative for visual disturbance.  Respiratory: Negative for cough, chest tightness, shortness of breath and wheezing.   Cardiovascular: Positive for leg swelling. Negative for chest pain and palpitations.  Endocrine: Negative for polydipsia and polyuria.  Genitourinary: Negative for dysuria.  Neurological: Negative for dizziness, seizures, syncope, weakness, light-headedness and headaches.       Objective:   Physical Exam  Constitutional: She appears well-developed and well-nourished.  Neck: Neck supple. No JVD present. No thyromegaly present.  Cardiovascular: Normal rate and regular rhythm.   Pulmonary/Chest: Effort normal and breath sounds normal. No respiratory distress. She has no wheezes. She has no rales.  Musculoskeletal:  She has on support hose.  Neurological: She is alert.  Skin: No rash noted.  Psychiatric: She has a normal mood and affect. Her behavior is normal.          Assessment & Plan:    #1 type 2 diabetes. Well controlled by recent readings. Recheck A1c. Continue yearly eye exams  #2 hypertension. Borderline elevated today. She is encouraged to lose some weight. Continue close monitoring and be in touch if consistently greater than 140/80  #3 dyslipidemia. Recheck lipid and hepatic panel. Continue simvastatin

## 2015-07-24 ENCOUNTER — Telehealth: Payer: Self-pay | Admitting: Family Medicine

## 2015-07-24 NOTE — Telephone Encounter (Signed)
Patient said that [facility] is having a hard time getting a prescription for patient's pouches and drainage bags. Please send prescription.   She also wants to know if her maintenance prescriptions has been switched over to her mail order [place].

## 2015-07-26 MED ORDER — LOSARTAN POTASSIUM 100 MG PO TABS: 100.0000 mg | ORAL_TABLET | Freq: Every day | ORAL | Status: DC

## 2015-07-26 MED ORDER — ACCU-CHEK FASTCLIX LANCETS MISC: Status: DC

## 2015-07-26 MED ORDER — SIMVASTATIN 40 MG PO TABS: 40.0000 mg | ORAL_TABLET | Freq: Every day | ORAL | Status: DC

## 2015-07-26 MED ORDER — CETIRIZINE HCL 10 MG PO TABS: 10.0000 mg | ORAL_TABLET | Freq: Every day | ORAL | Status: DC | PRN

## 2015-07-26 MED ORDER — METFORMIN HCL 500 MG PO TABS: ORAL_TABLET | ORAL | Status: DC

## 2015-07-26 MED ORDER — GLUCOSE BLOOD VI STRP: ORAL_STRIP | Status: DC

## 2015-07-26 MED ORDER — FUROSEMIDE 80 MG PO TABS: 80.0000 mg | ORAL_TABLET | Freq: Two times a day (BID) | ORAL | Status: DC

## 2015-07-27 NOTE — Telephone Encounter (Signed)
Pt is aware that [facility] is currently waiting on the PA to be approved.

## 2015-08-22 NOTE — Telephone Encounter (Signed)
Mercedes from [facility] said we need to  call [phone] to start PA for DME for pouches and drainage bags. Pt has been waiting over a month.

## 2015-08-24 ENCOUNTER — Other Ambulatory Visit: Payer: Self-pay | Admitting: Family Medicine

## 2015-08-24 NOTE — Telephone Encounter (Signed)
Pt is aware that I have placed an order with [facility] so we are just waiting for them to run this through insurance to see if a PA is needed. The phone number to [facility] is [phone]. I did provide her with a manufacturer number for her to get free samples mailed to her until she receives her order. Sample number [phone].

## 2015-08-31 ENCOUNTER — Telehealth: Payer: Self-pay | Admitting: Family Medicine

## 2015-08-31 NOTE — Telephone Encounter (Signed)
Pt no longer  want her medications from [facility]. Pt will call us when she needs refills. Please send refill of  accu chek fastclix lancets to [facility]

## 2015-09-04 MED ORDER — ACCU-CHEK FASTCLIX LANCETS MISC: Status: DC

## 2015-09-04 NOTE — Telephone Encounter (Signed)
Medication sent into the new pharmacy.

## 2015-09-12 DIAGNOSIS — Z1231 Encounter for screening mammogram for malignant neoplasm of breast: Secondary | ICD-10-CM | POA: Diagnosis not present

## 2015-09-13 ENCOUNTER — Telehealth: Payer: Self-pay | Admitting: Family Medicine

## 2015-09-13 NOTE — Telephone Encounter (Signed)
Noted  

## 2015-09-13 NOTE — Telephone Encounter (Signed)
Pt will no longer be using [facility]. Pt will be using company call [facility] and phone # [phone] the company will fax order for her ostomy supplies

## 2015-09-27 ENCOUNTER — Encounter: Payer: Self-pay | Admitting: Family Medicine

## 2015-09-28 DIAGNOSIS — Z936 Other artificial openings of urinary tract status: Secondary | ICD-10-CM | POA: Diagnosis not present

## 2015-10-01 ENCOUNTER — Emergency Department (HOSPITAL_COMMUNITY)
Admission: EM | Admit: 2015-10-01 | Discharge: 2015-10-01 | Disposition: A | Discharge status: Home / Self Care | Payer: Commercial Managed Care - HMO | Attending: Emergency Medicine | Admitting: Emergency Medicine

## 2015-10-01 ENCOUNTER — Telehealth: Payer: Self-pay | Admitting: Family Medicine

## 2015-10-01 ENCOUNTER — Encounter (HOSPITAL_COMMUNITY): Payer: Self-pay | Admitting: Emergency Medicine

## 2015-10-01 DIAGNOSIS — A419 Sepsis, unspecified organism: Secondary | ICD-10-CM

## 2015-10-01 DIAGNOSIS — N390 Urinary tract infection, site not specified: Secondary | ICD-10-CM

## 2015-10-01 DIAGNOSIS — R509 Fever, unspecified: Secondary | ICD-10-CM | POA: Diagnosis not present

## 2015-10-01 DIAGNOSIS — Z906 Acquired absence of other parts of urinary tract: Secondary | ICD-10-CM

## 2015-10-01 DIAGNOSIS — R109 Unspecified abdominal pain: Secondary | ICD-10-CM | POA: Diagnosis not present

## 2015-10-01 DIAGNOSIS — I10 Essential (primary) hypertension: Secondary | ICD-10-CM | POA: Insufficient documentation

## 2015-10-01 DIAGNOSIS — N12 Tubulo-interstitial nephritis, not specified as acute or chronic: Secondary | ICD-10-CM

## 2015-10-01 DIAGNOSIS — E119 Type 2 diabetes mellitus without complications: Secondary | ICD-10-CM | POA: Insufficient documentation

## 2015-10-01 DIAGNOSIS — Z936 Other artificial openings of urinary tract status: Secondary | ICD-10-CM | POA: Diagnosis not present

## 2015-10-01 NOTE — Telephone Encounter (Signed)
°  Relation to [id number] Call back [phone] Pharmacy:  Reason for call: pt is needing a referral to [facility], states she already is a pt there, however based on her insurance she has to have the referral, pt has [place], states she can see the NP this week if the referral is faxed over.  Pt would like for you to call her when the referral is done.

## 2015-10-01 NOTE — Telephone Encounter (Signed)
Order entered. Please see message and refer pt. Thank you.

## 2015-10-01 NOTE — ED Notes (Signed)
Pt states that she has had R sided flank pain, fever, and N/V since Saturday. Thinks she may have a UTI or kidney stone. Alert and oriented.

## 2015-10-02 DIAGNOSIS — R5381 Other malaise: Secondary | ICD-10-CM | POA: Diagnosis not present

## 2015-10-02 DIAGNOSIS — R509 Fever, unspecified: Secondary | ICD-10-CM | POA: Diagnosis not present

## 2015-10-15 ENCOUNTER — Other Ambulatory Visit: Payer: Self-pay | Admitting: Family Medicine

## 2015-10-24 DIAGNOSIS — Z87440 Personal history of urinary (tract) infections: Secondary | ICD-10-CM | POA: Diagnosis not present

## 2015-10-24 DIAGNOSIS — N200 Calculus of kidney: Secondary | ICD-10-CM | POA: Diagnosis not present

## 2015-11-30 DIAGNOSIS — Z936 Other artificial openings of urinary tract status: Secondary | ICD-10-CM | POA: Diagnosis not present

## 2015-12-27 DIAGNOSIS — R1084 Generalized abdominal pain: Secondary | ICD-10-CM | POA: Diagnosis not present

## 2015-12-27 DIAGNOSIS — Z87442 Personal history of urinary calculi: Secondary | ICD-10-CM | POA: Diagnosis not present

## 2015-12-28 ENCOUNTER — Other Ambulatory Visit: Payer: Self-pay | Admitting: Family Medicine

## 2016-01-04 ENCOUNTER — Other Ambulatory Visit (INDEPENDENT_AMBULATORY_CARE_PROVIDER_SITE_OTHER): Payer: Commercial Managed Care - HMO

## 2016-01-04 ENCOUNTER — Ambulatory Visit (INDEPENDENT_AMBULATORY_CARE_PROVIDER_SITE_OTHER): Payer: Commercial Managed Care - HMO

## 2016-01-04 VITALS — BP 140/70 | HR 78 | Ht 69.5 in | Wt 179.0 lb

## 2016-01-04 DIAGNOSIS — Z780 Asymptomatic menopausal state: Secondary | ICD-10-CM

## 2016-01-04 DIAGNOSIS — Z729 Problem related to lifestyle, unspecified: Secondary | ICD-10-CM

## 2016-01-04 DIAGNOSIS — Z0000 Encounter for general adult medical examination without abnormal findings: Secondary | ICD-10-CM | POA: Diagnosis not present

## 2016-01-04 LAB — CBC WITH DIFFERENTIAL/PLATELET
BASOS PCT: 0.3 % (ref 0.0–3.0)
Basophils Absolute: 0 10*3/uL (ref 0.0–0.1)
EOS PCT: 1.7 % (ref 0.0–5.0)
Eosinophils Absolute: 0.1 10*3/uL (ref 0.0–0.7)
HCT: 35 % — ABNORMAL LOW (ref 36.0–46.0)
HEMOGLOBIN: 11.5 g/dL — AB (ref 12.0–15.0)
Lymphocytes Relative: 37.1 % (ref 12.0–46.0)
Lymphs Abs: 3.2 10*3/uL (ref 0.7–4.0)
MCHC: 33 g/dL (ref 30.0–36.0)
MCV: 86.8 fl (ref 78.0–100.0)
Monocytes Absolute: 0.4 10*3/uL (ref 0.1–1.0)
Monocytes Relative: 4.8 % (ref 3.0–12.0)
NEUTROS ABS: 4.8 10*3/uL (ref 1.4–7.7)
Neutrophils Relative %: 56.1 % (ref 43.0–77.0)
Platelets: 542 10*3/uL — ABNORMAL HIGH (ref 150.0–400.0)
RBC: 4.03 Mil/uL (ref 3.87–5.11)
RDW: 13.7 % (ref 11.5–15.5)
WBC: 8.6 10*3/uL (ref 4.0–10.5)

## 2016-01-04 LAB — BASIC METABOLIC PANEL
BUN: 47 mg/dL — ABNORMAL HIGH (ref 6–23)
CALCIUM: 9.6 mg/dL (ref 8.4–10.5)
CO2: 19 mEq/L (ref 19–32)
Chloride: 106 mEq/L (ref 96–112)
Creatinine, Ser: 1.4 mg/dL — ABNORMAL HIGH (ref 0.40–1.20)
GFR: 39.33 mL/min — AB (ref >60.00)
GLUCOSE: 120 mg/dL — AB (ref 70–99)
Potassium: 4.9 mEq/L (ref 3.5–5.1)
SODIUM: 136 meq/L (ref 135–145)

## 2016-01-04 LAB — HEPATIC FUNCTION PANEL
ALBUMIN: 4.1 g/dL (ref 3.5–5.2)
ALK PHOS: 86 U/L (ref 39–117)
ALT: 25 U/L (ref 0–35)
AST: 17 U/L (ref 0–37)
BILIRUBIN DIRECT: 0.1 mg/dL (ref 0.0–0.3)
TOTAL PROTEIN: 7.1 g/dL (ref 6.0–8.3)
Total Bilirubin: 0.3 mg/dL (ref 0.2–1.2)

## 2016-01-04 LAB — TSH: TSH: 1.27 u[IU]/mL (ref 0.35–4.50)

## 2016-01-04 LAB — LIPID PANEL
CHOL/HDL RATIO: 4
Cholesterol: 138 mg/dL (ref 0–200)
HDL: 33.7 mg/dL — ABNORMAL LOW (ref >39.00)
LDL Cholesterol: 68 mg/dL (ref 0–99)
NONHDL: 103.94
Triglycerides: 182 mg/dL — ABNORMAL HIGH (ref 0.0–149.0)
VLDL: 36.4 mg/dL (ref 0.0–40.0)

## 2016-01-04 LAB — HEMOGLOBIN A1C: Hgb A1c MFr Bld: 6.9 % — ABNORMAL HIGH (ref 4.6–6.5)

## 2016-01-04 NOTE — Patient Instructions (Addendum)
Jordan [name] , Thank you for taking time to come for your Medicare Wellness Visit. I appreciate your ongoing commitment to your health goals. Please review the following plan we discussed and let me know if I can assist you in the future.   Recommended continue to make exercise options a priority  Diabetes and weight loss; Diabetic and Nutritional counseling at [facility]; [phone] ask for number for the [place]; [phone]   Caregiver support group and information regarding [place] is at the; [facility] Address: [address], [place], [place] [address]  Phone: [phone]     These are the goals we discussed: Goals    None      This is a list of the screening recommended for you and due dates:  Health Maintenance  Topic Date Due  .  Hepatitis C: One time screening is recommended by Center for Disease Control  (CDC) for  adults born from [birth year] through 1967.   Jul 15, 1945  . DEXA scan (bone density measurement)  05/24/2009  . Hemoglobin A1C  12/12/2015  . Complete foot exam   12/20/2015  . Eye exam for diabetics  12/20/2015  . Flu Shot  01/15/2016  . Mammogram  09/11/2017  . Tetanus Vaccine  06/02/2021  . Colon Cancer Screening  12/06/2024  . Shingles Vaccine  Addressed  . Pneumonia vaccines  Completed     Bone Densitometry Bone densitometry is an imaging test that uses a special X-ray to measure the amount of calcium and other minerals in your bones (bone density). This test is also known as a bone mineral density test or dual-energy X-ray absorptiometry (DXA). The test can measure bone density at your hip and your spine. It is similar to having a regular X-ray. You may have this test to:  Diagnose a condition that causes weak or thin bones (osteoporosis).  Predict your risk of a broken bone (fracture).  Determine how well osteoporosis treatment is working. LET [facility]  CARE PROVIDER KNOW ABOUT:  Any allergies you have.  All medicines you are taking, including vitamins, herbs, eye drops, creams, and over-the-counter medicines.  Previous problems you or members of your family have had with the use of anesthetics.  Any blood disorders you have.  Previous surgeries you have had.  Medical conditions you have.  Possibility of pregnancy.  Any other medical test you had within the previous 14 days that used contrast material. RISKS AND COMPLICATIONS Generally, this is a safe procedure. However, problems can occur and may include the following:  This test exposes you to a very small amount of radiation.  The risks of radiation exposure may be greater to unborn children. BEFORE THE PROCEDURE  Do not take any calcium supplements for 24 hours before having the test. You can otherwise eat and drink what you usually do.  Take off all metal jewelry, eyeglasses, dental appliances, and any other metal objects. PROCEDURE  You may lie on an exam table. There will be an X-ray generator below you and an imaging device above you.  Other devices, such as boxes or braces, may be used to position your body properly for the scan.  You will need to lie still while the machine slowly scans your body.  The images will show up on a computer monitor. AFTER THE PROCEDURE You may need more testing at a later time.   This information is not intended to replace advice given  to you by your health care provider. Make sure you discuss any questions you have with your health care provider.   Document Released: 06/24/2004 Document Revised: 06/23/2014 Document Reviewed: 11/10/2013 Elsevier Interactive Patient Education 2016 [place] in the Home  Falls can cause injuries. They can happen to people of all ages. There are many things you can do to make your home safe and to help prevent falls.  WHAT CAN I DO ON THE OUTSIDE OF MY HOME?  Regularly fix the  edges of walkways and driveways and fix any cracks.  Remove anything that might make you trip as you walk through a door, such as a raised step or threshold.  Trim any bushes or trees on the path to your home.  Use bright outdoor lighting.  Clear any walking paths of anything that might make someone trip, such as rocks or tools.  Regularly check to see if handrails are loose or broken. Make sure that both sides of any steps have handrails.  Any raised decks and porches should have guardrails on the edges.  Have any leaves, snow, or ice cleared regularly.  Use sand or salt on walking paths during winter.  Clean up any spills in your garage right away. This includes oil or grease spills. WHAT CAN I DO IN THE BATHROOM?   Use night lights.  Install grab bars by the toilet and in the tub and shower. Do not use towel bars as grab bars.  Use non-skid mats or decals in the tub or shower.  If you need to sit down in the shower, use a plastic, non-slip stool.  Keep the floor dry. Clean up any water that spills on the floor as soon as it happens.  Remove soap buildup in the tub or shower regularly.  Attach bath mats securely with double-sided non-slip rug tape.  Do not have throw rugs and other things on the floor that can make you trip. WHAT CAN I DO IN THE BEDROOM?  Use night lights.  Make sure that you have a light by your bed that is easy to reach.  Do not use any sheets or blankets that are too big for your bed. They should not hang down onto the floor.  Have a firm chair that has side arms. You can use this for support while you get dressed.  Do not have throw rugs and other things on the floor that can make you trip. WHAT CAN I DO IN THE KITCHEN?  Clean up any spills right away.  Avoid walking on wet floors.  Keep items that you use a lot in easy-to-reach places.  If you need to reach something above you, use a strong step stool that has a grab bar.  Keep  electrical cords out of the way.  Do not use floor polish or wax that makes floors slippery. If you must use wax, use non-skid floor wax.  Do not have throw rugs and other things on the floor that can make you trip. WHAT CAN I DO WITH MY STAIRS?  Do not leave any items on the stairs.  Make sure that there are handrails on both sides of the stairs and use them. Fix handrails that are broken or loose. Make sure that handrails are as long as the stairways.  Check any carpeting to make sure that it is firmly attached to the stairs. Fix any carpet that is loose or worn.  Avoid having throw rugs at the top  or bottom of the stairs. If you do have throw rugs, attach them to the floor with carpet tape.  Make sure that you have a light switch at the top of the stairs and the bottom of the stairs. If you do not have them, ask someone to add them for you. WHAT ELSE CAN I DO TO HELP PREVENT FALLS?  Wear shoes that:  Do not have high heels.  Have rubber bottoms.  Are comfortable and fit you well.  Are closed at the toe. Do not wear sandals.  If you use a stepladder:  Make sure that it is fully opened. Do not climb a closed stepladder.  Make sure that both sides of the stepladder are locked into place.  Ask someone to hold it for you, if possible.  Clearly mark and make sure that you can see:  Any grab bars or handrails.  First and last steps.  Where the edge of each step is.  Use tools that help you move around (mobility aids) if they are needed. These include:  Canes.  Walkers.  Scooters.  Crutches.  Turn on the lights when you go into a dark area. Replace any light bulbs as soon as they burn out.  Set up your furniture so you have a clear path. Avoid moving your furniture around.  If any of your floors are uneven, fix them.  If there are any pets around you, be aware of where they are.  Review your medicines with your doctor. Some medicines can make you feel dizzy.  This can increase your chance of falling. Ask your doctor what other things that you can do to help prevent falls.   This information is not intended to replace advice given to you by your health care provider. Make sure you discuss any questions you have with your health care provider.   Document Released: 03/29/2009 Document Revised: 10/17/2014 Document Reviewed: 07/07/2014 Elsevier Interactive Patient Education 2016 [place] Maintenance, Female Adopting a healthy lifestyle and getting preventive care can go a long way to promote health and wellness. Talk with your health care provider about what schedule of regular examinations is right for you. This is a good chance for you to check in with your provider about disease prevention and staying healthy. In between checkups, there are plenty of things you can do on your own. Experts have done a lot of research about which lifestyle changes and preventive measures are most likely to keep you healthy. Ask your health care provider for more information. WEIGHT AND DIET  Eat a healthy diet  Be sure to include plenty of vegetables, fruits, low-fat dairy products, and lean protein.  Do not eat a lot of foods high in solid fats, added sugars, or salt.  Get regular exercise. This is one of the most important things you can do for your health.  Most adults should exercise for at least 150 minutes each week. The exercise should increase your heart rate and make you sweat (moderate-intensity exercise).  Most adults should also do strengthening exercises at least twice a week. This is in addition to the moderate-intensity exercise.  Maintain a healthy weight  Body mass index (BMI) is a measurement that can be used to identify possible weight problems. It estimates body fat based on height and weight. Your health care provider can help determine your BMI and help you achieve or maintain a healthy weight.  For females 71 years of age and  older:  A BMI below 18.5 is considered underweight.  A BMI of 18.5 to 24.9 is normal.  A BMI of 25 to 29.9 is considered overweight.  A BMI of 30 and above is considered obese.  Watch levels of cholesterol and blood lipids  You should start having your blood tested for lipids and cholesterol at 72 years of age, then have this test every 5 years.  You may need to have your cholesterol levels checked more often if:  Your lipid or cholesterol levels are high.  You are older than 72 years of age.  You are at high risk for heart disease.  CANCER SCREENING   Lung Cancer  Lung cancer screening is recommended for adults 21-54 years old who are at high risk for lung cancer because of a history of smoking.  A yearly low-dose CT scan of the lungs is recommended for people who:  Currently smoke.  Have quit within the past 15 years.  Have at least a 30-pack-year history of smoking. A pack year is smoking an average of one pack of cigarettes a day for 1 year.  Yearly screening should continue until it has been 15 years since you quit.  Yearly screening should stop if you develop a health problem that would prevent you from having lung cancer treatment.  Breast Cancer  Practice breast self-awareness. This means understanding how your breasts normally appear and feel.  It also means doing regular breast self-exams. Let your health care provider know about any changes, no matter how small.  If you are in your 20s or 30s, you should have a clinical breast exam (CBE) by a health care provider every 1-3 years as part of a regular health exam.  If you are 62 or older, have a CBE every year. Also consider having a breast X-ray (mammogram) every year.  If you have a family history of breast cancer, talk to your health care provider about genetic screening.  If you are at high risk for breast cancer, talk to your health care provider about having an MRI and a mammogram every  year.  Breast cancer gene (BRCA) assessment is recommended for women who have family members with BRCA-related cancers. BRCA-related cancers include:  Breast.  Ovarian.  Tubal.  Peritoneal cancers.  Results of the assessment will determine the need for genetic counseling and BRCA1 and BRCA2 testing. Cervical Cancer Your health care provider may recommend that you be screened regularly for cancer of the pelvic organs (ovaries, uterus, and vagina). This screening involves a pelvic examination, including checking for microscopic changes to the surface of your cervix (Pap test). You may be encouraged to have this screening done every 3 years, beginning at age 67.  For women ages 64-65, health care providers may recommend pelvic exams and Pap testing every 3 years, or they may recommend the Pap and pelvic exam, combined with testing for human papilloma virus (HPV), every 5 years. Some types of HPV increase your risk of cervical cancer. Testing for HPV may also be done on women of any age with unclear Pap test results.  Other health care providers may not recommend any screening for nonpregnant women who are considered low risk for pelvic cancer and who do not have symptoms. Ask your health care provider if a screening pelvic exam is right for you.  If you have had past treatment for cervical cancer or a condition that could lead to cancer, you need Pap tests and screening for cancer for at least 20  years after your treatment. If Pap tests have been discontinued, your risk factors (such as having a new sexual partner) need to be reassessed to determine if screening should resume. Some women have medical problems that increase the chance of getting cervical cancer. In these cases, your health care provider may recommend more frequent screening and Pap tests. Colorectal Cancer  This type of cancer can be detected and often prevented.  Routine colorectal cancer screening usually begins at 72 years of  age and continues through 72 years of age.  Your health care provider may recommend screening at an earlier age if you have risk factors for colon cancer.  Your health care provider may also recommend using home test kits to check for hidden blood in the stool.  A small camera at the end of a tube can be used to examine your colon directly (sigmoidoscopy or colonoscopy). This is done to check for the earliest forms of colorectal cancer.  Routine screening usually begins at age 42.  Direct examination of the colon should be repeated every 5-10 years through 72 years of age. However, you may need to be screened more often if early forms of precancerous polyps or small growths are found. Skin Cancer  Check your skin from head to toe regularly.  Tell your health care provider about any new moles or changes in moles, especially if there is a change in a mole's shape or color.  Also tell your health care provider if you have a mole that is larger than the size of a pencil eraser.  Always use sunscreen. Apply sunscreen liberally and repeatedly throughout the day.  Protect yourself by wearing long sleeves, pants, a wide-brimmed hat, and sunglasses whenever you are outside. HEART DISEASE, DIABETES, AND HIGH BLOOD PRESSURE   High blood pressure causes heart disease and increases the risk of stroke. High blood pressure is more likely to develop in:  People who have blood pressure in the high end of the normal range (130-139/85-89 mm Hg).  People who are overweight or obese.  People who are African American.  If you are 54-75 years of age, have your blood pressure checked every 3-5 years. If you are 35 years of age or older, have your blood pressure checked every year. You should have your blood pressure measured twice--once when you are at a hospital or clinic, and once when you are not at a hospital or clinic. Record the average of the two measurements. To check your blood pressure when you are  not at a hospital or clinic, you can use:  An automated blood pressure machine at a pharmacy.  A home blood pressure monitor.  If you are between 74 years and 64 years old, ask your health care provider if you should take aspirin to prevent strokes.  Have regular diabetes screenings. This involves taking a blood sample to check your fasting blood sugar level.  If you are at a normal weight and have a low risk for diabetes, have this test once every three years after 72 years of age.  If you are overweight and have a high risk for diabetes, consider being tested at a younger age or more often. PREVENTING INFECTION  Hepatitis B  If you have a higher risk for hepatitis B, you should be screened for this virus. You are considered at high risk for hepatitis B if:  You were born in a country where hepatitis B is common. Ask your health care provider which countries are considered  high risk.  Your parents were born in a high-risk country, and you have not been immunized against hepatitis B (hepatitis B vaccine).  You have HIV or AIDS.  You use needles to inject street drugs.  You live with someone who has hepatitis B.  You have had sex with someone who has hepatitis B.  You get hemodialysis treatment.  You take certain medicines for conditions, including cancer, organ transplantation, and autoimmune conditions. Hepatitis C  Blood testing is recommended for:  Everyone born from [birth year] through 1967.  Anyone with known risk factors for hepatitis C. Sexually transmitted infections (STIs)  You should be screened for sexually transmitted infections (STIs) including gonorrhea and chlamydia if:  You are sexually active and are younger than 72 years of age.  You are older than 72 years of age and your health care provider tells you that you are at risk for this type of infection.  Your sexual activity has changed since you were last screened and you are at an increased risk for  chlamydia or gonorrhea. Ask your health care provider if you are at risk.  If you do not have HIV, but are at risk, it may be recommended that you take a prescription medicine daily to prevent HIV infection. This is called pre-exposure prophylaxis (PrEP). You are considered at risk if:  You are sexually active and do not regularly use condoms or know the HIV status of your partner(s).  You take drugs by injection.  You are sexually active with a partner who has HIV. Talk with your health care provider about whether you are at high risk of being infected with HIV. If you choose to begin PrEP, you should first be tested for HIV. You should then be tested every 3 months for as long as you are taking PrEP.  PREGNANCY   If you are premenopausal and you may become pregnant, ask your health care provider about preconception counseling.  If you may become pregnant, take 400 to 800 micrograms (mcg) of folic acid every day.  If you want to prevent pregnancy, talk to your health care provider about birth control (contraception). OSTEOPOROSIS AND MENOPAUSE   Osteoporosis is a disease in which the bones lose minerals and strength with aging. This can result in serious bone fractures. Your risk for osteoporosis can be identified using a bone density scan.  If you are 29 years of age or older, or if you are at risk for osteoporosis and fractures, ask your health care provider if you should be screened.  Ask your health care provider whether you should take a calcium or vitamin D supplement to lower your risk for osteoporosis.  Menopause may have certain physical symptoms and risks.  Hormone replacement therapy may reduce some of these symptoms and risks. Talk to your health care provider about whether hormone replacement therapy is right for you.  HOME CARE INSTRUCTIONS   Schedule regular health, dental, and eye exams.  Stay current with your immunizations.   Do not use any tobacco products  including cigarettes, chewing tobacco, or electronic cigarettes.  If you are pregnant, do not drink alcohol.  If you are breastfeeding, limit how much and how often you drink alcohol.  Limit alcohol intake to no more than 1 drink per day for nonpregnant women. One drink equals 12 ounces of beer, 5 ounces of wine, or 1 ounces of hard liquor.  Do not use street drugs.  Do not share needles.  Ask your health care provider  for help if you need support or information about quitting drugs.  Tell your health care provider if you often feel depressed.  Tell your health care provider if you have ever been abused or do not feel safe at home.   This information is not intended to replace advice given to you by your health care provider. Make sure you discuss any questions you have with your health care provider.   Document Released: 12/16/2010 Document Revised: 06/23/2014 Document Reviewed: 05/04/2013 Elsevier Interactive Patient Education [employer].

## 2016-01-04 NOTE — Progress Notes (Addendum)
Subjective:   Jordan [name] is a 70 y.o. female who presents for Medicare Annual (Subsequent) preventive examination.   Cardiac Risk Factors include: advanced age (>18men, >65 women);diabetes mellitus;dyslipidemia;family history of premature cardiovascular disease;hypertension HRA assessment completed during this visit with Jordan [name]  The Patient was informed that the wellness visit is to identify future health risk and educate and initiate measures that can reduce risk for increased disease through the lifespan.    NO ROS; Medicare Wellness Visit Last OV:  06/13/2015 Labs completed: 06/13/2015  Health is good Has check up's and compliant with meds   Lifestyle review and risk: (Mother HD; Father HD; emphysema; colon cancer bladder cancer;  Lipids chol 151; Triglycerides; 257; HDL 39 and LDL 85;  A1c 6.4 / BS runs 130 in the am; down around 100 later / good control CP; w/c bound HX; was born premature; found out when she was 1 she had CP   Ambulatory until she had surgery to hip in 84 at [facility]  No feeling in right leg; and some sensation loss in left post op  Intensive rehab;  DM; A1c trended down 7.7 2015 to 6.4 05/2015 To note (has urostomy)   Psychosocial: rehab counselor; still works part time; loves work  Retail banker in [employer]  Sister lives in [place]; stage iv copd   Tobacco: never smoked   How many drinks do you have per week? 0  Medications; no issues. Did verbalize muscle soreness on statin; referred to MD to discuss; (actually made apt on the way out today) Also educated to talk to pharmacist about Co10 and already taking Vit D  BMI: 26  Diet;   Nutritionally sound/ A1c down to 6.4  Did not review specifics as she has her diabetic plan;  Watches her weight so as not to gain, as this would interfere with transfers;  BMI actually normal; but could not weight today; cannot get on scale   Exercise Has stationary bike at home; pedals with hands but feet move as well,  just slower;   Goes 15 minutes x 1 or 2 times per day Was going to Gym asso with [facility]; did machine "new stepper but closed this option.  Discussed the importance of continuing exercise program and the patient [facility] had; what machines they have that will benefit her;   [place]; lives with spouse and helps with some am care as necessary;  Transfers independent; regular tub/shower with bath bench; bars in the wrong place/ educated on community resource for home repair; "community home solutions"  Lives in apt; the apt is handicapped accessible  Age in place questionable ; has 2 service dogs which help out a lot   Fall hx; no Fall risk: Fear of falling? No  Gait: limited walking for tx  Given education on "Fall Prevention in the Home" for more safety tips the patient can apply as appropriate.  Long term goal is to "age in place"; undecided at present but doing well thus far  Safety features reviewed for safe community; firearms if in the home and keep in a safe place;  smoke alarms (yes); sun protection when outside reviewed; driving difficulties or accidents: no  Discussed handicapped accessibility in community;   Mental Health: Counselor by profession  Any emotional problems? Anxious, depressed, irritable, sad or blue? No Still working;  Denies feeling depressed or hopeless; voices pleasure in daily life  Pain: "not really"  Cognitive; no issues at present  Manages checkbook, medications; no failures of task Ad8 score reviewed for issues;  Issues making decisions; no  Less interest in hobbies / activities" no  Repeats questions, stories; family complaining: NO  Trouble using ordinary gadgets; microwave; computer: no  Forgets the month or year: no  Mismanaging finances: no  Missing apt: no but does write them down  Daily problems with thinking of memory NO Ad8 score is 0   Any dizziness when standing up?  no  Mobilization and Functional losses from last year to this year? Not really;   Sleep pattern changes; no; sometimes she wakes up but has no issue going back to sleep   Urinary or fecal incontinence reviewed: still has urostomy; bag external; no issues;   Advanced Directive addressed; has this; has at home/ discussed bringing a copy for the chart   Counseling Health Maintenance Gaps: Hep C:  Ordered for future draw. 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 Herbert Deaner / outreached today to get Jordan [name]'s report   Immunizations Due: (Vaccines reviewed and educated regarding any overdue)   Individual Goal: Her goal is to not gain weight; eat well and keep labs wnl   Health Recommendations and Referrals Risk for CV disease reviewed; including BP;  Discussed the importance of exercise for weight and strength;  Barriers to Success Is w/c bound but not an issue; fairly independent except with reaching her feet; spouse helps her with am care if needed, otherwise, fairly independent   Current Care Team reviewed and updated [facility] 10/24/2015 4 visits since 11/25/2013 [place], [place] [facility] [place] 06/13/2015 9 visits since 06/06/2013 [place], [place] at [facility] 04/02/2015 1 visits since 04/02/2015 [facility] [place] [facility] 10/17/2014 1 visits since 10/17/2014  Education provided and lifestyle  risk discussed   All Health Maintenance Gaps Reviewed for closure       Objective:     Vitals: BP 140/70 mmHg  Pulse 78  Ht 5' 9.5" (1.765 m)  Wt 179 lb (81.194 kg)  BMI 26.06 kg/m2  SpO2 97%  Body mass index is 26.06 kg/(m^2).   Tobacco History  Smoking status  . Never Smoker   Smokeless tobacco  . Never Used     Counseling given: Yes   Past Medical History  Diagnosis Date  . HYPERLIPIDEMIA 11/29/2009  . HYPERTENSION 11/29/2009  . COLONIC POLYPS 07/11/2010  . CEREBRAL PALSY 07/11/2010    PT CAN TRANSFER BED TO CHAIR--CAN STAND BRIEFLY--BUT NOT ABLE TO WALK; RT LEG HYPEREXTENDS BACKWARD AND IS WEAK-HAS A POWER CHAIR  . Diabetes mellitus     ORAL MED FOR DIABETES  . Blood transfusion 1992    AFTER  SURGERY TO REMOVE BLADDER  . GERD (gastroesophageal reflux disease)     PAST HISTORY GERD--WATCHES DIET - NOT ON ANY MEDS FOR GERD  . Incisional hernia     NO PAIN-NOT CAUSING ANY DISCOMFORT  . Arthritis  OA  . PONV (postoperative nausea and vomiting)   . Presence of urostomy [facility])     11-28-14 remains   Past Surgical History  Procedure Laterality Date  . Appendectomy    . Cholecystectomy    . Tonsillectomy    . Revision urostomy cutaneous  1992  . Abdominal hysterectomy  1990    partial  . Radical cystectomy  1992    neurogenic bladder sec CP  . Rt ankle fusion  1975    SEVERAL RT ANKLE SURGERIES PRIOR TO THE FUSION  . Left carpal tunnel release x 2    . Right carpal tunnel release    . Lumbar sympathetectomies x 2    . 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 Nephrolithotomy  10/01/2011    Procedure: NEPHROLITHOTOMY PERCUTANEOUS;  Surgeon: Franchot Gallo, MD;  Location: [facility];  Service: Urology;  Laterality: Right;       . Colonoscopy with propofol N/A 12/07/2014    Procedure: COLONOSCOPY WITH PROPOFOL;  Surgeon: Gatha Mayer, MD;  Location: [facility];  Service: Endoscopy;  Laterality: N/A;   Family History  Problem Relation Age of  Onset  . Heart disease Mother 48    COPD  . Heart disease Father     COPD   . Emphysema Father   . Colon cancer Father 13    Died at 4  . Bladder Cancer Father   . Colon polyps Sister   . Esophageal cancer Neg Hx   . Kidney disease Neg Hx    History  Sexual Activity  . Sexual Activity: Not on file    Outpatient Encounter Prescriptions as of 01/04/2016  Medication Sig  . ACCU-CHEK FASTCLIX LANCETS MISC Use to check blood sugar once a day  . acetaminophen (TYLENOL) 500 MG tablet Take 500 mg by mouth every 6 (six) hours as needed for mild pain. Pain   . aspirin 81 MG tablet Take 81 mg by mouth daily.   Marland Kitchen b complex vitamins capsule Take 1 capsule by mouth daily.   . cetirizine (ZYRTEC) 10 MG tablet Take 1 tablet (10 mg total) by mouth daily as needed. allergies  . Cholecalciferol (VITAMIN D) 2000 UNITS CAPS Take 2,000 Units by mouth See admin instructions. Takes only on days Monday through thursday  . furosemide (LASIX) 80 MG tablet Take 1 tablet (80 mg total) by mouth 2 (two) times daily.  Marland Kitchen glucose blood (ACCU-CHEK SMARTVIEW) test strip TEST TWICE DAILY AS DIRECTED  . losartan (COZAAR) 100 MG tablet Take 1 tablet (100 mg total) by mouth daily.  . metFORMIN (GLUCOPHAGE) 500 MG tablet Take 2 tablets by mouth in the morning and 2 tablets every evening.  . Multiple Vitamin (MULTIVITAMIN) capsule Take 1 capsule by mouth daily.   . Omega-3 Fatty Acids (FISH OIL) 1200 MG CAPS Take 1,200 mg by mouth 2 (two) times daily.   . polyethylene glycol powder (GLYCOLAX/MIRALAX) powder Take 17 g by mouth daily. One capfull daily by mouth with liquid  . simvastatin (ZOCOR) 40 MG tablet Take 1 tablet (40 mg total) by mouth daily.  . sodium chloride (OCEAN) 0.65 % nasal spray Place 1-2 sprays into the nose as needed. Allergies   . Diclofenac Sodium 3 % GEL Place 1 application onto the skin 2 (two) times daily. (Patient not taking: Reported on 01/04/2016)  . [DISCONTINUED] losartan (COZAAR) 100 MG  tablet TAKE 1 TABLET(100 MG) BY MOUTH DAILY  . [DISCONTINUED] simvastatin (ZOCOR) 40 MG tablet  TAKE 1 TABLET(40 MG) BY MOUTH DAILY   No facility-administered encounter medications on file as of 01/04/2016.    Activities of Daily Living In your present state of health, do you have any difficulty performing the following activities: 01/04/2016  Hearing? (No Data)  Vision? (No Data)  Difficulty concentrating or making decisions? N  Walking or climbing stairs? Y  Dressing or bathing? N  Doing errands, shopping? N  Preparing Food and eating ? N  Using the Toilet? N  In the past six months, have you accidently leaked urine? (No Data)  Do you have problems with loss of bowel control? N  Managing your Medications? N  Managing your Finances? N  Housekeeping or managing your Housekeeping? N    Patient Care Team: [name], MD as PCP - General (Family Medicine)    Assessment:     Exercise Activities and Dietary recommendations Current Exercise Habits: Home exercise routine, Time (Minutes): 20, Frequency (Times/Week): 4, Weekly Exercise (Minutes/Week): 80, Intensity: Mild  Goals    . patient     Continue with weight control measures Maintain labs with med and eating well;        Fall Risk Fall Risk  01/04/2016 06/12/2014  Falls in the past year? No No   Depression Screen PHQ 2/9 Scores 01/04/2016 06/12/2014 10/01/2011  PHQ - 2 Score 0 0 0    [place]  Cognitive Testing MMSE - Mini Mental State Exam 01/04/2016  Not completed: (No Data)  Ad8 score is 0   Immunization History  Administered Date(s) Administered  . Influenza Split 05/04/2011  . Influenza Whole 04/16/2010  . Influenza, High Dose Seasonal PF 04/15/2013, 05/14/2015  . Influenza, Seasonal, Injecte, Preservative Fre 06/01/2012  . Influenza,inj,Quad PF,36+ Mos 02/09/2014  . PPD Test 01/07/2011, 01/07/2011, 01/07/2011, 01/07/2011, 01/07/2011  . Pneumococcal Conjugate-13 06/12/2014  . Pneumococcal  Polysaccharide-23 06/16/2004, 01/07/2011  . Td 06/16/2005  . Tdap 06/03/2011   Screening Tests Health Maintenance  Topic Date Due  . Hepatitis C Screening  [birth year]  . DEXA SCAN  05/24/2009  . HEMOGLOBIN A1C  12/12/2015  . FOOT EXAM  12/20/2015  . OPHTHALMOLOGY EXAM  12/20/2015  . INFLUENZA VACCINE  01/15/2016  . MAMMOGRAM  09/11/2017  . TETANUS/TDAP  06/02/2021  . COLONOSCOPY  12/06/2024  . ZOSTAVAX  Addressed  . PNA vac Low Risk Adult  Completed      Plan:   Given information on long term care access via [employer]  Discussed going to the diabetes and nutritional management center if weight control becomes a problem. Can go x 2 visit every year.  Recommended exercise and explore at [employer] etc.   Resource for [employer] to fup on changing rails in bathroom or other as needed   During the course of the visit the patient was educated and counseled about the following appropriate screening and preventive services:   Vaccines to include Pneumoccal, Influenza, Hepatitis B, Td, Zostavax, HCV  Electrocardiogram  Cardiovascular Disease  Colorectal cancer screening  Bone density screening  Diabetes screening  Glaucoma screening  Mammography/PAP  Nutrition counseling   Patient Instructions (the written plan) was given to the patient.   Wynetta Fines, RN  01/04/2016  Agree with assessment and plan as above [name] MD [facility]

## 2016-01-08 ENCOUNTER — Encounter (HOSPITAL_BASED_OUTPATIENT_CLINIC_OR_DEPARTMENT_OTHER): Payer: Self-pay

## 2016-01-08 ENCOUNTER — Encounter: Payer: Self-pay | Admitting: Family Medicine

## 2016-01-08 ENCOUNTER — Ambulatory Visit (INDEPENDENT_AMBULATORY_CARE_PROVIDER_SITE_OTHER): Payer: Commercial Managed Care - HMO | Admitting: Family Medicine

## 2016-01-08 ENCOUNTER — Emergency Department (HOSPITAL_BASED_OUTPATIENT_CLINIC_OR_DEPARTMENT_OTHER)
Admission: EM | Admit: 2016-01-08 | Discharge: 2016-01-09 | Disposition: A | Discharge status: Home / Self Care | Payer: Commercial Managed Care - HMO | Attending: Emergency Medicine | Admitting: Emergency Medicine

## 2016-01-08 VITALS — BP 120/68 | HR 79 | Temp 98.8°F | Ht 69.5 in | Wt 180.0 lb

## 2016-01-08 DIAGNOSIS — D75839 Thrombocytosis, unspecified: Secondary | ICD-10-CM

## 2016-01-08 DIAGNOSIS — E785 Hyperlipidemia, unspecified: Secondary | ICD-10-CM | POA: Insufficient documentation

## 2016-01-08 DIAGNOSIS — Z7984 Long term (current) use of oral hypoglycemic drugs: Secondary | ICD-10-CM | POA: Insufficient documentation

## 2016-01-08 DIAGNOSIS — H6122 Impacted cerumen, left ear: Secondary | ICD-10-CM | POA: Diagnosis not present

## 2016-01-08 DIAGNOSIS — Z780 Asymptomatic menopausal state: Secondary | ICD-10-CM

## 2016-01-08 DIAGNOSIS — I10 Essential (primary) hypertension: Secondary | ICD-10-CM | POA: Insufficient documentation

## 2016-01-08 DIAGNOSIS — D473 Essential (hemorrhagic) thrombocythemia: Secondary | ICD-10-CM | POA: Diagnosis not present

## 2016-01-08 DIAGNOSIS — M1990 Unspecified osteoarthritis, unspecified site: Secondary | ICD-10-CM | POA: Diagnosis not present

## 2016-01-08 DIAGNOSIS — N390 Urinary tract infection, site not specified: Secondary | ICD-10-CM | POA: Insufficient documentation

## 2016-01-08 DIAGNOSIS — Z79899 Other long term (current) drug therapy: Secondary | ICD-10-CM | POA: Insufficient documentation

## 2016-01-08 DIAGNOSIS — E119 Type 2 diabetes mellitus without complications: Secondary | ICD-10-CM | POA: Insufficient documentation

## 2016-01-08 DIAGNOSIS — N289 Disorder of kidney and ureter, unspecified: Secondary | ICD-10-CM | POA: Diagnosis not present

## 2016-01-08 DIAGNOSIS — R0789 Other chest pain: Secondary | ICD-10-CM | POA: Diagnosis not present

## 2016-01-08 DIAGNOSIS — Z0001 Encounter for general adult medical examination with abnormal findings: Secondary | ICD-10-CM | POA: Diagnosis not present

## 2016-01-08 DIAGNOSIS — R109 Unspecified abdominal pain: Secondary | ICD-10-CM | POA: Diagnosis present

## 2016-01-08 DIAGNOSIS — Z0000 Encounter for general adult medical examination without abnormal findings: Secondary | ICD-10-CM

## 2016-01-08 DIAGNOSIS — Z7982 Long term (current) use of aspirin: Secondary | ICD-10-CM | POA: Diagnosis not present

## 2016-01-08 LAB — BASIC METABOLIC PANEL
ANION GAP: 11 (ref 5–15)
BUN: 46 mg/dL — ABNORMAL HIGH (ref 6–20)
CHLORIDE: 107 mmol/L (ref 101–111)
CO2: 17 mmol/L — AB (ref 22–32)
Calcium: 9.8 mg/dL (ref 8.9–10.3)
Creatinine, Ser: 1.39 mg/dL — ABNORMAL HIGH (ref 0.44–1.00)
GFR calc non Af Amer: 37 mL/min — ABNORMAL LOW (ref >60)
GFR, EST AFRICAN AMERICAN: 43 mL/min — AB (ref >60)
Glucose, Bld: 203 mg/dL — ABNORMAL HIGH (ref 65–99)
POTASSIUM: 4.4 mmol/L (ref 3.5–5.1)
Sodium: 135 mmol/L (ref 135–145)

## 2016-01-08 LAB — URINALYSIS, ROUTINE W REFLEX MICROSCOPIC
Bilirubin Urine: NEGATIVE
GLUCOSE, UA: NEGATIVE mg/dL
KETONES UR: NEGATIVE mg/dL
Nitrite: POSITIVE — AB
PH: 8 (ref 5.0–8.0)
Protein, ur: >300 mg/dL — AB
Specific Gravity, Urine: 1.013 (ref 1.005–1.030)

## 2016-01-08 LAB — CBC WITH DIFFERENTIAL/PLATELET
BASOS ABS: 0 10*3/uL (ref 0.0–0.1)
BASOS PCT: 0 %
Eosinophils Absolute: 0.1 10*3/uL (ref 0.0–0.7)
Eosinophils Relative: 0 %
HEMATOCRIT: 36.8 % (ref 36.0–46.0)
HEMOGLOBIN: 11.7 g/dL — AB (ref 12.0–15.0)
Lymphocytes Relative: 11 %
Lymphs Abs: 2.1 10*3/uL (ref 0.7–4.0)
MCH: 29.1 pg (ref 26.0–34.0)
MCHC: 31.8 g/dL (ref 30.0–36.0)
MCV: 91.5 fL (ref 78.0–100.0)
MONOS PCT: 3 %
Monocytes Absolute: 0.6 10*3/uL (ref 0.1–1.0)
NEUTROS ABS: 17.3 10*3/uL — AB (ref 1.7–7.7)
NEUTROS PCT: 86 %
Platelets: 483 10*3/uL — ABNORMAL HIGH (ref 150–400)
RBC: 4.02 MIL/uL (ref 3.87–5.11)
RDW: 13.8 % (ref 11.5–15.5)
WBC: 20.1 10*3/uL — AB (ref 4.0–10.5)

## 2016-01-08 LAB — URINE MICROSCOPIC-ADD ON

## 2016-01-08 MED ORDER — ONDANSETRON 4 MG PO TBDP: 4.0000 mg | ORAL_TABLET | Freq: Three times a day (TID) | ORAL | 0 refills | Status: DC | PRN

## 2016-01-08 MED ORDER — CIPROFLOXACIN HCL 500 MG PO TABS: 500.0000 mg | ORAL_TABLET | Freq: Two times a day (BID) | ORAL | 0 refills | Status: DC

## 2016-01-08 MED ORDER — HYDROCODONE-ACETAMINOPHEN 5-325 MG PO TABS: 2.0000 | ORAL_TABLET | ORAL | 0 refills | Status: DC | PRN

## 2016-01-08 MED ORDER — HYDROCODONE-ACETAMINOPHEN 5-325 MG PO TABS
1.0000 | ORAL_TABLET | Freq: Once | ORAL | Status: AC
  Administered 2016-01-09: 1 via ORAL
  Filled 2016-01-08: qty 1

## 2016-01-08 MED ORDER — SODIUM CHLORIDE 0.9 % IV BOLUS (SEPSIS)
1000.0000 mL | Freq: Once | INTRAVENOUS | Status: AC
  Administered 2016-01-08: 1000 mL via INTRAVENOUS

## 2016-01-08 MED ORDER — LEVOFLOXACIN IN D5W 750 MG/150ML IV SOLN
750.0000 mg | Freq: Once | INTRAVENOUS | Status: AC
  Administered 2016-01-08: 750 mg via INTRAVENOUS
  Filled 2016-01-08: qty 150

## 2016-01-08 NOTE — Progress Notes (Signed)
Pre visit review using our clinic review tool, if applicable. No additional management support is needed unless otherwise documented below in the visit note. 

## 2016-01-08 NOTE — ED Provider Notes (Signed)
[facility] DEPT [facility] Provider Note   CSN: [id number] Arrival date & time: 01/08/16  2031  First Provider Contact:  None  By signing my name below, I, Evelene Croon, attest that this documentation has been prepared under the direction and in the presence of Tanna Furry, MD . Electronically Signed: Evelene Croon, Scribe. 01/08/2016. 11:40 PM.  History   Chief Complaint Chief Complaint  Patient presents with  . Flank Pain   The history is provided by the patient. No language interpreter was used.   HPI Comments:  Jordan [name] is a 70 y.o. female who presents to the Emergency Department complaining of gradual onset, moderate right flank which began a few hours ago. She notes associated pain to her right mid abdomen. Pt states she saw PCP today for a physical and was told her BUN and creatinine were elevated; creatinine 6 months ago was 0.99 and today was 1.4. Pt denies pain during the office visit. Pt notes similar pain ~2 weeks ago; states she was diagnosed with a kidney infection on 12/27/15 and from 12/25/15 to 01/04/16 she finished course of septra with relief. Pt also reports h/o kidney stones; states she had one removed but believes she still still has another. No alleviating factors noted. She takes 80 mg lasix nightly. Her last CT was ~ 4 years ago  Past Medical History:  Diagnosis Date  . Arthritis    OA  . Blood transfusion 1992   AFTER  SURGERY TO REMOVE BLADDER  . 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 COLONIC POLYPS 07/11/2010  . Diabetes mellitus    ORAL MED FOR DIABETES  . GERD (gastroesophageal reflux disease)    PAST HISTORY GERD--WATCHES DIET - NOT ON ANY MEDS FOR GERD  . HYPERLIPIDEMIA 11/29/2009  . HYPERTENSION 11/29/2009  . Incisional hernia    NO PAIN-NOT CAUSING ANY DISCOMFORT  . PONV (postoperative nausea and vomiting)   . Presence of urostomy  [facility])    11-28-14 remains    Patient Active Problem List   Diagnosis Date Noted  . Hx of colonic polyps   . Type 2 diabetes mellitus, controlled ([place]) 08/04/2014  . Pyelonephritis 07/08/2014  . History of kidney stones 06/12/2014  . Hypercalcemia 06/12/2014  . Sepsis secondary to UTI ([place]) 12/03/2011  . 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 Type 2 diabetes mellitus ([place]) 10/09/2010  . Infantile cerebral palsy ([place]) 07/11/2010  . Hyperlipidemia 11/29/2009  . Essential hypertension 11/29/2009    Past Surgical History:  Procedure Laterality Date  . ABDOMINAL HYSTERECTOMY  1990   partial  . APPENDECTOMY    . CHOLECYSTECTOMY    . COLONOSCOPY WITH PROPOFOL N/A 12/07/2014   Procedure: COLONOSCOPY WITH PROPOFOL;  Surgeon: Gatha Mayer, MD;  Location: [facility] ENDOSCOPY;  Service: Endoscopy;  Laterality: N/A;  . LEFT CARPAL TUNNEL RELEASE X 2    . LUMBAR SYMPATHETECTOMIES X 2    . NEPHROLITHOTOMY  10/01/2011   Procedure: NEPHROLITHOTOMY PERCUTANEOUS;  Surgeon: Franchot Gallo, MD;  Location: [facility] ORS;  Service: Urology;  Laterality: Right;       . radical cystectomy  1992   neurogenic bladder sec CP  . REVISION UROSTOMY CUTANEOUS  1992  . RIGHT CARPAL TUNNEL RELEASE    . RT ANKLE FUSION  1975   SEVERAL RT ANKLE SURGERIES PRIOR TO THE FUSION  . 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 TONSILLECTOMY      OB History    No data available     Home Medications    Prior to Admission medications   Medication Sig Start Date End Date Taking? Authorizing Provider  ACCU-CHEK FASTCLIX LANCETS MISC Use to check blood sugar once a day 09/04/15   [name], MD  acetaminophen (TYLENOL) 500 MG tablet Take 500 mg by mouth every 6 (six) hours as needed for mild pain. Pain     Historical Provider, MD  aspirin 81 MG tablet Take 81 mg by mouth daily.     Historical Provider, MD  b complex vitamins capsule Take 1 capsule by mouth daily.     Historical Provider, MD  cetirizine  (ZYRTEC) 10 MG tablet Take 1 tablet (10 mg total) by mouth daily as needed. allergies 07/26/15   [name], MD  Cholecalciferol (VITAMIN D) 2000 UNITS CAPS Take 2,000 Units by mouth See admin instructions. Takes only on days Monday through thursday    Historical Provider, MD  ciprofloxacin (CIPRO) 500 MG tablet Take 1 tablet (500 mg total) by mouth every 12 (twelve) hours. 01/08/16   Tanna Furry, MD  Diclofenac Sodium 3 % GEL Place 1 application onto the skin 2 (two) times daily. 05/14/15   [name], MD  furosemide (LASIX) 80 MG tablet Take 1 tablet (80 mg total) by mouth 2 (two) times daily. 07/26/15   [name], MD  glucose blood (ACCU-CHEK SMARTVIEW) test strip TEST TWICE DAILY AS DIRECTED 07/26/15   [name], MD  HYDROcodone-acetaminophen (NORCO/VICODIN) 5-325 MG tablet Take 2 tablets by mouth every 4 (four) hours as needed. 01/08/16   Tanna Furry, MD  losartan (COZAAR) 100 MG tablet Take 1 tablet (100 mg total) by mouth daily. 07/26/15   [name], MD  metFORMIN (GLUCOPHAGE) 500 MG tablet Take 2 tablets by mouth in the morning and 2 tablets every evening. 07/26/15   [name], MD  Multiple Vitamin (MULTIVITAMIN) capsule Take 1 capsule by mouth daily.     Historical Provider, MD  Omega-3 Fatty Acids (FISH OIL) 1200 MG CAPS Take 1,200 mg by mouth 2 (two) times daily.     Historical Provider, MD  ondansetron (ZOFRAN ODT) 4 MG disintegrating tablet Take 1 tablet (4 mg total) by mouth every 8 (eight) hours as needed for nausea. 01/08/16   Tanna Furry, MD  polyethylene glycol powder (GLYCOLAX/MIRALAX) powder Take 17 g by mouth daily. One capfull daily by mouth with liquid    Historical Provider, MD  simvastatin (ZOCOR) 40 MG tablet Take 1 tablet (40 mg total) by mouth daily. 07/26/15   [name], MD  sodium chloride (OCEAN) 0.65 % nasal spray Place 1-2 sprays into the nose as needed. Allergies     Historical Provider, MD    Family History Family History    Problem Relation Age of Onset  . Heart disease Mother 2    COPD  . Heart disease Father     COPD   . Emphysema Father   . Colon cancer Father 25    Died at 18  . Bladder Cancer Father   . Colon polyps Sister   . Esophageal cancer Neg Hx   . Kidney disease Neg Hx     Social History Social History  Substance Use Topics  . Smoking status: Never Smoker  . Smokeless tobacco: Never Used  . Alcohol use No     Allergies  Morphine; Baclofen; Lisinopril; Metoclopramide hcl; and Penicillins   Review of Systems Review of Systems  Constitutional: Negative for appetite change, chills, diaphoresis, fatigue and fever.  HENT: Negative for mouth sores, sore throat and trouble swallowing.   Eyes: Negative for visual disturbance.  Respiratory: Negative for cough, chest tightness, shortness of breath and wheezing.   Cardiovascular: Negative for chest pain.  Gastrointestinal: Positive for abdominal pain. Negative for abdominal distention, diarrhea, nausea and vomiting.  Endocrine: Negative for polydipsia, polyphagia and polyuria.  Genitourinary: Positive for flank pain. Negative for dysuria, frequency and hematuria.  Musculoskeletal: Negative for gait problem.  Skin: Negative for color change, pallor and rash.  Neurological: Negative for dizziness, syncope, light-headedness and headaches.  Hematological: Does not bruise/bleed easily.  Psychiatric/Behavioral: Negative for behavioral problems and confusion.   Physical Exam Updated Vital Signs BP (!) 124/47 (BP Location: Left Wrist)   Pulse 72   Temp 98.3 F (36.8 C) (Oral)   Resp 18   Ht 5\' 9"  (1.753 m)   Wt 180 lb (81.6 kg)   SpO2 99%   BMI 26.58 kg/m   Physical Exam  Constitutional: She is oriented to person, place, and time. She appears well-developed and well-nourished. No distress.  HENT:  Head: Normocephalic.  Eyes: Conjunctivae are normal. Pupils are equal, round, and reactive to light. No scleral icterus.  Neck:  Normal range of motion. Neck supple. No thyromegaly present.  Cardiovascular: Normal rate and regular rhythm.  Exam reveals no gallop and no friction rub.   No murmur heard. Pulmonary/Chest: Effort normal and breath sounds normal. No respiratory distress. She has no wheezes. She has no rales.  Abdominal: Soft. Bowel sounds are normal. She exhibits no distension. There is tenderness. There is no rebound.  Tenderness to right mid abdomen urostomy left lower abdomen; stoma pink   Musculoskeletal: Normal range of motion. She exhibits edema.  Trace symmetric edema  Neurological: She is alert and oriented to person, place, and time.  Skin: Skin is warm and dry. No rash noted.  Psychiatric: She has a normal mood and affect. Her behavior is normal.  Nursing note and vitals reviewed.  ED Treatments / Results  DIAGNOSTIC STUDIES:  Oxygen Saturation is 99% on RA, normal by my interpretation.    COORDINATION OF CARE:  9:26 PM Discussed treatment plan with pt at bedside and pt agreed to plan.  Labs (all labs ordered are listed, but only abnormal results are displayed) Labs Reviewed  URINALYSIS, ROUTINE W REFLEX MICROSCOPIC (NOT AT [facility]) - Abnormal; Notable for the following:       Result Value   APPearance TURBID (*)    Hgb urine dipstick MODERATE (*)    Protein, ur >300 (*)    Nitrite POSITIVE (*)    Leukocytes, UA LARGE (*)    All other components within normal limits  URINE MICROSCOPIC-ADD ON - Abnormal; Notable for the following:    Squamous Epithelial / LPF 6-30 (*)    Bacteria, UA MANY (*)    All other components within normal limits  CBC WITH DIFFERENTIAL/PLATELET - Abnormal; Notable for the following:    WBC 20.1 (*)    Hemoglobin 11.7 (*)    Platelets 483 (*)    Neutro Abs 17.3 (*)    All other components within normal limits  BASIC METABOLIC PANEL - Abnormal; Notable for the following:    CO2 17 (*)    Glucose, Bld 203 (*)    BUN 46 (*)    Creatinine, Ser 1.39 (*)  GFR  calc non Af Amer 37 (*)    GFR calc Af Amer 43 (*)    All other components within normal limits  URINE CULTURE    EKG  EKG Interpretation None       Radiology No results found.  Procedures Procedures  Medications Ordered in ED Medications  levofloxacin (LEVAQUIN) IVPB 750 mg (750 mg Intravenous New Bag/Given 01/08/16 2312)  HYDROcodone-acetaminophen (NORCO/VICODIN) 5-325 MG per tablet 1 tablet (not administered)  sodium chloride 0.9 % bolus 1,000 mL (0 mLs Intravenous Stopped 01/08/16 2311)     Initial Impression / Assessment and Plan / ED Course  I have reviewed the triage vital signs and the nursing notes.  Pertinent labs & imaging results that were available during my care of the patient were reviewed by me and considered in my medical decision making (see chart for details).  Clinical Course    Leukocytosis of 20,000. Afebrile here. Continues to decline any pain medication as she states she does not want to be admitted and wants to drive herself home. After IV fluids and IV antibiotics and antiemetics she states she continues to feel well. Of offer her admission. She states concerned because she has her vehicle here which is negative Excess will for her chair when she also has with her here. She was dispensed 2 Vicodin to take at home. Prescription for Vicodin, Zofran, Cipro. Return here anytime with any worsening or today the [facility] facilities for probable admission if any changes or worsening.  Is not tachycardic, febrile. Has a normal lactate. Does not appear septic.  Final Clinical Impressions(s) / ED Diagnoses   Final diagnoses:  UTI (lower urinary tract infection)    New Prescriptions New Prescriptions   CIPROFLOXACIN (CIPRO) 500 MG TABLET    Take 1 tablet (500 mg total) by mouth every 12 (twelve) hours.   HYDROCODONE-ACETAMINOPHEN (NORCO/VICODIN) 5-325 MG TABLET    Take 2 tablets by mouth every 4 (four) hours as needed.   ONDANSETRON (ZOFRAN ODT) 4 MG  DISINTEGRATING TABLET    Take 1 tablet (4 mg total) by mouth every 8 (eight) hours as needed for nausea.  I personally performed the services described in this documentation, which was scribed in my presence. The recorded information has been reviewed and is accurate.     Tanna Furry, MD 01/08/16 2340

## 2016-01-08 NOTE — ED Triage Notes (Addendum)
C/o right flank pain, back pain x today-was treated approx 2 weeks ago for "kidney infection"-PCP today-had labs drawn 7/21 for annual visit-elevated BUN and creatinine-pt NAD-in own w/c

## 2016-01-08 NOTE — Patient Instructions (Signed)
We will schedule DEXA scan.  Return for labs next Tuesday.

## 2016-01-08 NOTE — ED Notes (Signed)
MD at bedside. 

## 2016-01-08 NOTE — Discharge Instructions (Signed)
Return anytime with any worsening of symptoms including pain, fever, vomiting, or any worsening symptoms.

## 2016-01-08 NOTE — Progress Notes (Signed)
Subjective:     Patient ID: Jordan [name], female   DOB: [birth year], 70 y.o.   MRN: [id number]  HPI Patient seen today for physical exam- and other medical issues as below:. She had recent Medicare subsequent annual wellness visit. Chronic problems include hypertension, type 2 diabetes, cerebral palsy, history of recurrent kidney stones, history of benign colon polyps, hyperlipidemia, and history of total cystectomy.  She's had previous shingles vaccine. Immunizations are up-to-date. Colonoscopy up-to-date. Mammogram up-to-date. Recent eye exam in February. Has not had recent bone density scan. Has not had previous hepatitis C screening. She thinks she had blood transfusion around 1992.  She's basically wheelchair bound. She has some chronic lower extremity edema which is unchanged.  She states she had recent urinary tract infection followed by urology. No recent fevers or chills.  Patient has had some recent fleeting episodes of tightness left arm and fullness left anterior chest with occasional radiation toward the neck. She is very sedentary. She states she had nuclear stress test several years ago which was unremarkable. She is basically requesting cardiology evaluation and referral. She's not had any recent discomfort over the past several days.  No orthopnea.  No exacerbating or alleviating factors.  Past Medical History:  Diagnosis Date  . Arthritis    OA  . Blood transfusion 1992   AFTER  SURGERY TO REMOVE BLADDER  . 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 COLONIC POLYPS 07/11/2010  . Diabetes mellitus    ORAL MED FOR DIABETES  . GERD (gastroesophageal reflux disease)    PAST HISTORY GERD--WATCHES DIET - NOT ON ANY MEDS FOR GERD  . HYPERLIPIDEMIA 11/29/2009  . HYPERTENSION 11/29/2009  . Incisional hernia    NO PAIN-NOT CAUSING ANY DISCOMFORT  . PONV  (postoperative nausea and vomiting)   . Presence of urostomy [facility])    11-28-14 remains   Past Surgical History:  Procedure Laterality Date  . ABDOMINAL HYSTERECTOMY  1990   partial  . APPENDECTOMY    . CHOLECYSTECTOMY    . COLONOSCOPY WITH PROPOFOL N/A 12/07/2014   Procedure: COLONOSCOPY WITH PROPOFOL;  Surgeon: Gatha Mayer, MD;  [facility];  Service: Endoscopy;  Laterality: N/A;  . LEFT CARPAL TUNNEL RELEASE X 2    . LUMBAR SYMPATHETECTOMIES X 2    . NEPHROLITHOTOMY  10/01/2011   Procedure: NEPHROLITHOTOMY PERCUTANEOUS;  Surgeon: Franchot Gallo, MD;  Location: [facility]S;  Service: Urology;  Laterality: Right;       . radical cystectomy  1992   neurogenic bladder sec CP  . REVISION UROSTOMY CUTANEOUS  1992  . RIGHT CARPAL TUNNEL RELEASE    . RT ANKLE FUSION  1975   SEVERAL RT ANKLE SURGERIES PRIOR TO THE FUSION  . 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 TONSILLECTOMY      reports that she has never smoked. She has never used smokeless tobacco. She reports that she does not drink alcohol or use drugs. family history includes Bladder Cancer in her father; Colon cancer (age of onset: 13) in her father; Colon polyps in her sister; Emphysema in her father; Heart disease in her father; Heart disease (age of onset: 10) in her mother. Allergies  Allergen Reactions  . Morphine Anaphylaxis  . Baclofen     GI upset  . Lisinopril Cough  . Metoclopramide Hcl  Reglan, irritable  . Penicillins     " MAJOR CASE OF HIVES"       Review of Systems  Constitutional: Negative for activity change, appetite change, fatigue, fever and unexpected weight change.  HENT: Negative for hearing loss, sore throat and trouble swallowing.   Respiratory: Negative for cough, shortness of breath and wheezing.   Cardiovascular: Negative for palpitations.  Gastrointestinal: Negative for abdominal distention, abdominal pain, blood in stool, nausea and vomiting.  Genitourinary:  Negative for dysuria and hematuria.  Musculoskeletal: Negative for gait problem and myalgias.  Skin: Negative for rash.  Neurological: Negative for dizziness, syncope, weakness and headaches.  Hematological: Negative for adenopathy.  Psychiatric/Behavioral: Negative for confusion and dysphoric mood.       Objective:   Physical Exam  Constitutional: She is oriented to person, place, and time.  Obese female who is pleasant and in no distress  HENT:  Mouth/Throat: Oropharynx is clear and moist.  Cerumen left ear canal which was removed with curette. Eardrums appear normal. Right ear canal very minimal cerumen  Eyes: Pupils are equal, round, and reactive to light.  Neck: Neck supple.  Cardiovascular: Normal rate and regular rhythm.  Exam reveals no gallop and no friction rub.   Pulmonary/Chest: Effort normal and breath sounds normal. No respiratory distress. She has no wheezes. She has no rales.  Abdominal: Soft. She exhibits no distension. There is no tenderness. There is no rebound and no guarding.  Musculoskeletal: She exhibits no edema.  Lymphadenopathy:    She has no cervical adenopathy.  Neurological: She is alert and oriented to person, place, and time. No cranial nerve deficit.  Skin: No rash noted.  Psychiatric: She has a normal mood and affect. Her behavior is normal. Judgment and thought content normal.       Assessment:     #1 physical exam. Patient has not had bone density scan in several years and we recommend setting this up. Immunizations up-to-date. Labs reviewed and significant for the following  #2 thrombocytosis. Platelet count 542,000. Question related to recent urinary infection  #3 acute renal insufficiency. Creatinine 1.4 which is up from her usual baseline around 0.9.  #4 cerumen impaction left ear canal  #5 patient has occasional fleeting chest and left upper extremity symptoms and is requesting cardiology referral    Plan:     -Repeat labs within the  next week including basic metabolic panel, CBC, and hepatitis C antibody -Set up DEXA scan -Stay well-hydrated -Continue yearly flu vaccination -Set up cardiology referral.  [name] MD [facility]

## 2016-01-09 NOTE — ED Notes (Signed)
levaquin infusing, alert, NAD, calm, interactive, no changes.

## 2016-01-09 NOTE — Progress Notes (Signed)
Thanks. Done.  For some reason, I don't think this chart was routed to me on Friday, so I was unaware Manuela Schwartz had seen her for [facility] visit.  I will discuss with her so our work flow is more efficient.

## 2016-01-10 ENCOUNTER — Telehealth: Payer: Self-pay | Admitting: *Deleted

## 2016-01-10 LAB — URINE CULTURE

## 2016-01-11 ENCOUNTER — Telehealth (HOSPITAL_BASED_OUTPATIENT_CLINIC_OR_DEPARTMENT_OTHER): Payer: Self-pay

## 2016-01-15 ENCOUNTER — Other Ambulatory Visit (INDEPENDENT_AMBULATORY_CARE_PROVIDER_SITE_OTHER): Payer: Commercial Managed Care - HMO

## 2016-01-15 DIAGNOSIS — D473 Essential (hemorrhagic) thrombocythemia: Secondary | ICD-10-CM

## 2016-01-15 DIAGNOSIS — N289 Disorder of kidney and ureter, unspecified: Secondary | ICD-10-CM

## 2016-01-15 DIAGNOSIS — D75839 Thrombocytosis, unspecified: Secondary | ICD-10-CM

## 2016-01-15 LAB — CBC WITH DIFFERENTIAL/PLATELET
BASOS ABS: 0 10*3/uL (ref 0.0–0.1)
Basophils Relative: 0.6 % (ref 0.0–3.0)
EOS ABS: 0.1 10*3/uL (ref 0.0–0.7)
Eosinophils Relative: 1.5 % (ref 0.0–5.0)
HCT: 34 % — ABNORMAL LOW (ref 36.0–46.0)
Hemoglobin: 11.2 g/dL — ABNORMAL LOW (ref 12.0–15.0)
LYMPHS ABS: 2.5 10*3/uL (ref 0.7–4.0)
LYMPHS PCT: 37.6 % (ref 12.0–46.0)
MCHC: 32.9 g/dL (ref 30.0–36.0)
MCV: 87.1 fl (ref 78.0–100.0)
MONOS PCT: 7.5 % (ref 3.0–12.0)
Monocytes Absolute: 0.5 10*3/uL (ref 0.1–1.0)
NEUTROS PCT: 52.8 % (ref 43.0–77.0)
Neutro Abs: 3.5 10*3/uL (ref 1.4–7.7)
Platelets: 379 10*3/uL (ref 150.0–400.0)
RBC: 3.91 Mil/uL (ref 3.87–5.11)
RDW: 14.3 % (ref 11.5–15.5)
WBC: 6.6 10*3/uL (ref 4.0–10.5)

## 2016-01-15 LAB — BASIC METABOLIC PANEL
BUN: 40 mg/dL — ABNORMAL HIGH (ref 6–23)
CALCIUM: 9.9 mg/dL (ref 8.4–10.5)
CO2: 20 mEq/L (ref 19–32)
CREATININE: 1.24 mg/dL — AB (ref 0.40–1.20)
Chloride: 107 mEq/L (ref 96–112)
GFR: 45.24 mL/min — AB (ref >60.00)
GLUCOSE: 133 mg/dL — AB (ref 70–99)
Potassium: 3.7 mEq/L (ref 3.5–5.1)
SODIUM: 139 meq/L (ref 135–145)

## 2016-01-16 LAB — HEPATITIS C ANTIBODY: HCV AB: NEGATIVE

## 2016-01-18 IMAGING — US US RENAL
1 series · 14 of 25 positions shown · non-contrast
Comparison: [HOSPITAL] Renal ultrasound
11/19/2012. CT Abdomen and Pelvis 11/30/2011.

CLINICAL DATA: 68-year-old female with urinary tract infection with
current history of bladder resection and urinary diversion ileal
conduit several years ago. Initial encounter.

EXAM:
RENAL/URINARY TRACT ULTRASOUND COMPLETE

[Series 1: us renal · 0.27mm/px · 14 of 26 slices shown]
[im 1/26]
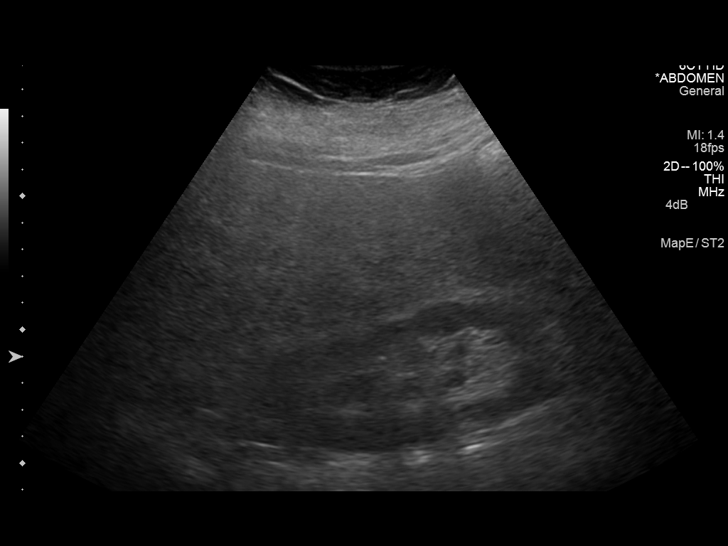
[im 3/26]
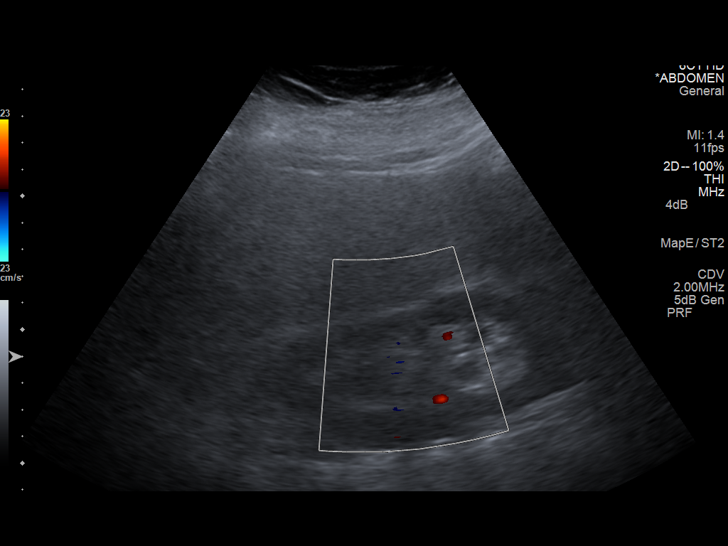
[im 5/26]
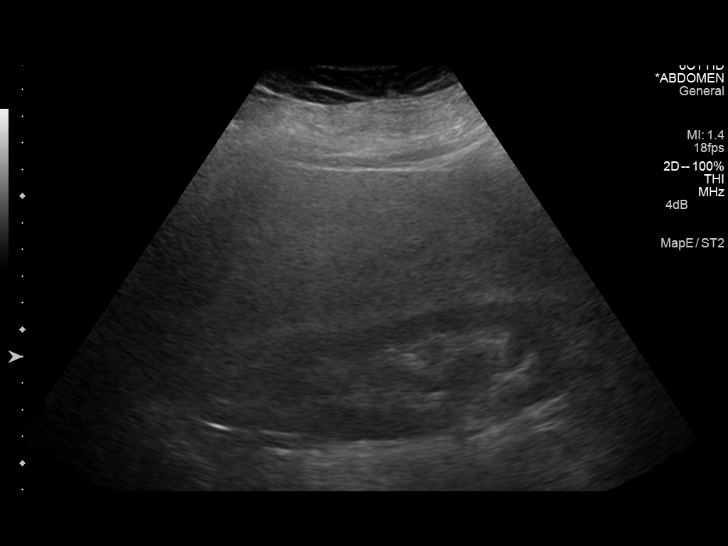
[im 7/26]
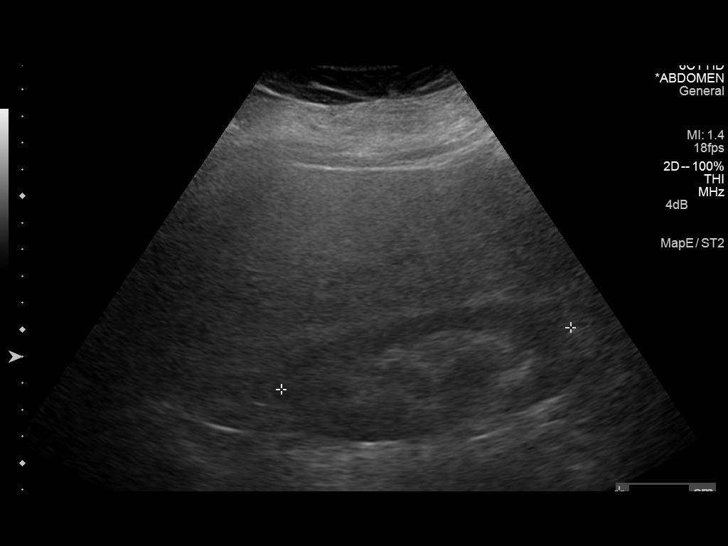
[im 9/26]
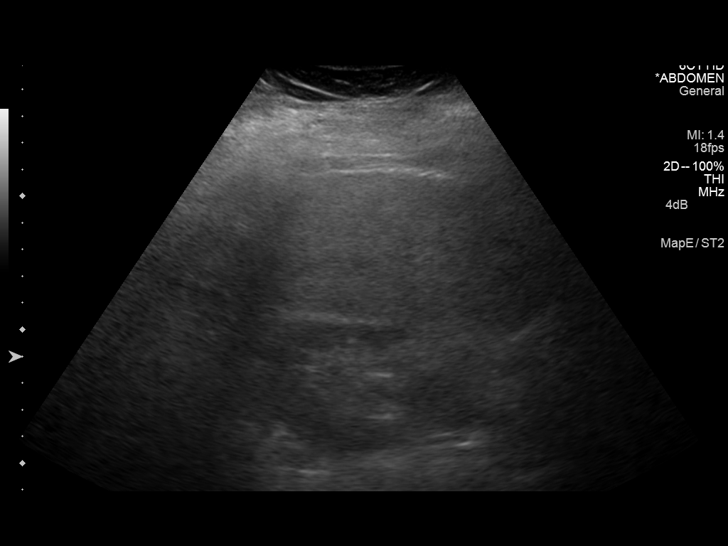
[im 10/26]
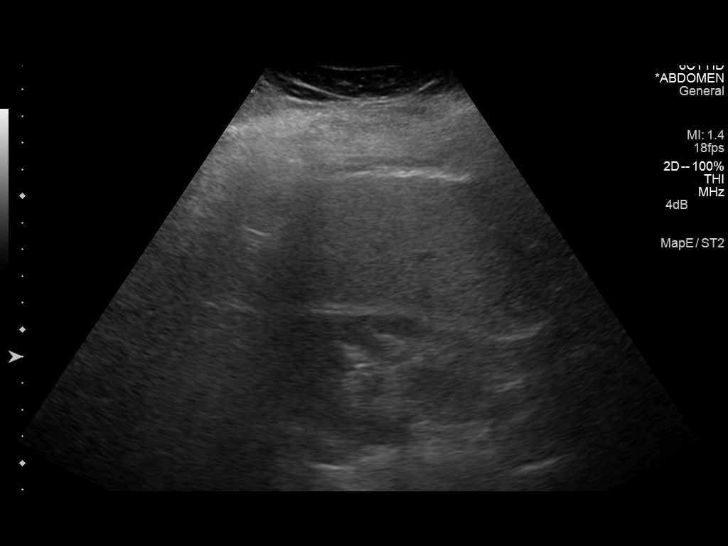
[im 12/26]
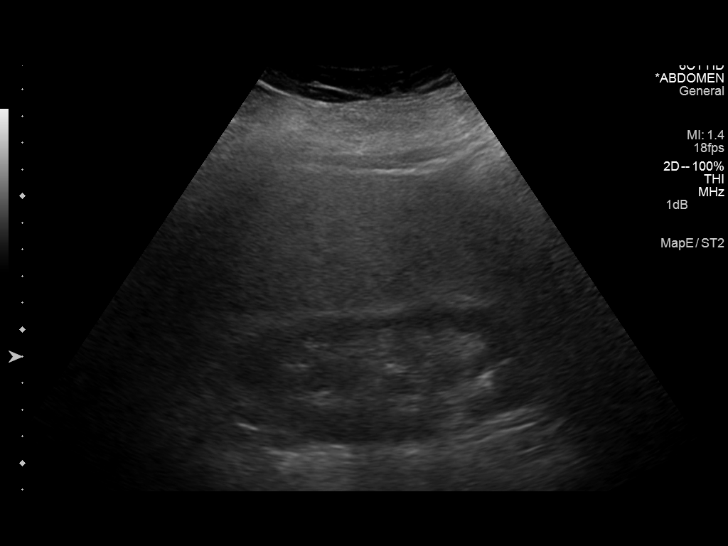
[im 14/26]
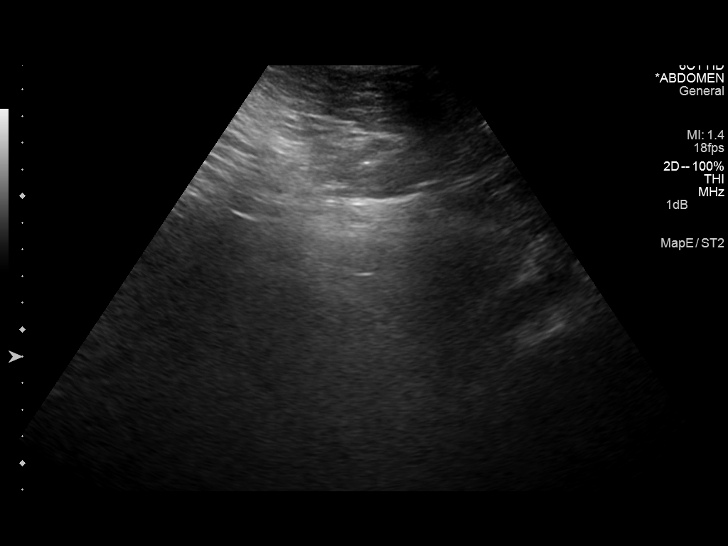
[im 16/26]
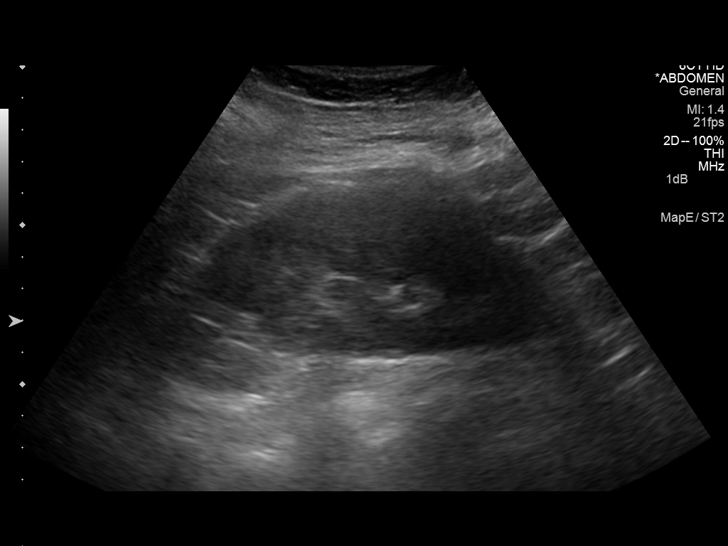
[im 17/26]
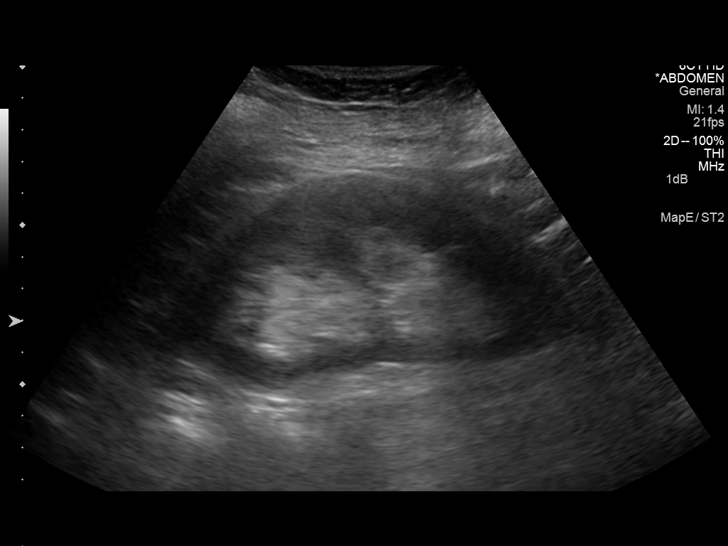
[im 19/26]
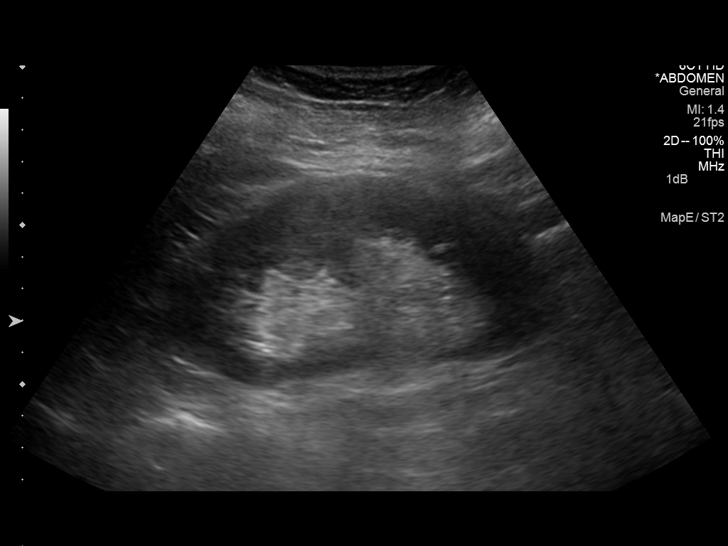
[im 21/26]
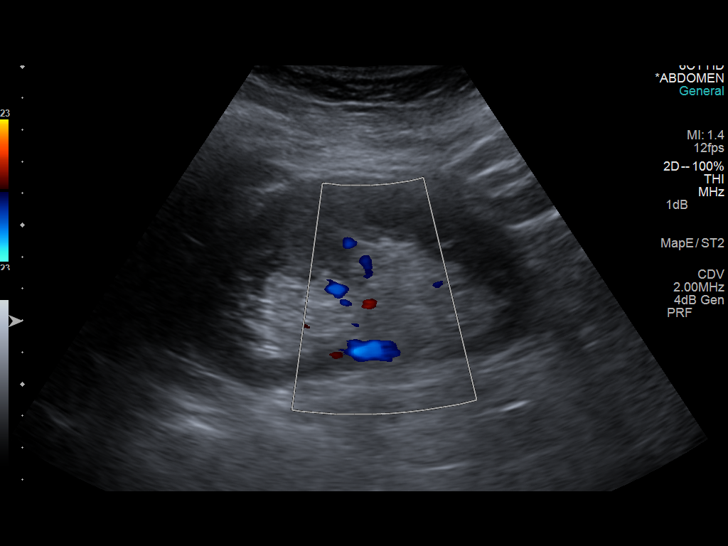
[im 23/26]
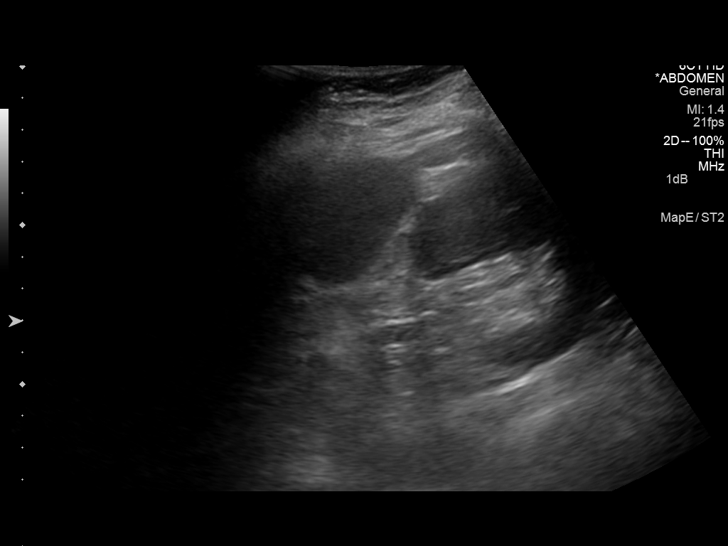
[im 26/26]
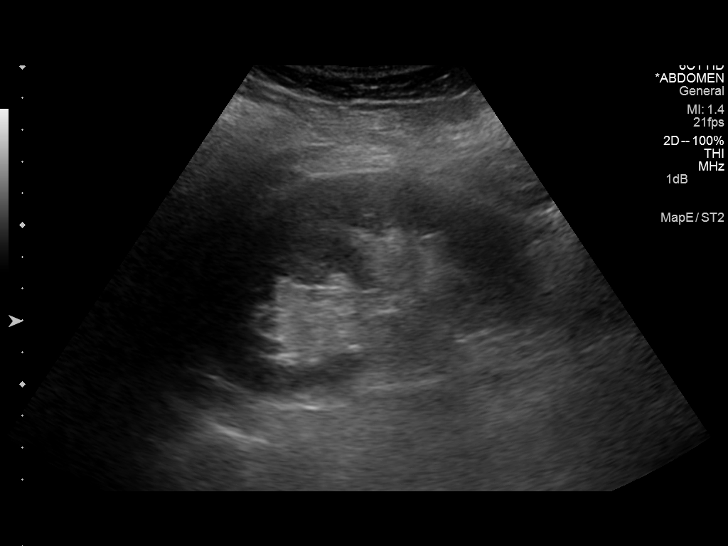

[14 of 25 positions shown; findings below may reference images not displayed]

FINDINGS: Right Kidney:

Length: 11.1 cm. Echogenicity within normal limits. No mass.
Borderline to mild enlargement of the intra renal collecting system
and right renal pelvis. Suspect renal cortical thinning since 6689.

Left Kidney:

Length: 12.0 cm. Echogenicity within normal limits. No mass or
hydronephrosis visualized.

Bladder:

Surgically absent
IMPRESSION: 1. Mild right hydronephrosis. Chronic right renal cortical thinning.
2. Negative sonographic appearance of the left kidney.

## 2016-01-21 ENCOUNTER — Other Ambulatory Visit: Payer: Self-pay | Admitting: Family Medicine

## 2016-01-29 DIAGNOSIS — Z936 Other artificial openings of urinary tract status: Secondary | ICD-10-CM | POA: Diagnosis not present

## 2016-01-30 NOTE — Progress Notes (Signed)
Electrophysiology Office Note   Date:  01/31/2016   ID:  Jordan [name], DOB 01-Apr-1946, MRN [id number]  PCP:  [name], MD  Cardiologist:   Constance Haw, MD    Chief Complaint  Patient presents with  . New Patient (Initial Visit)    chest discomfort, skipped heart beats     History of Present Illness: Jordan [name] is a 70 y.o. female who presents today for electrophysiology evaluation.   Has been having episodes of left arm tightness and shortness of breath with exertion.  She has a sedentary lifestyle. She says that she has been using a hand like for years and has been getting more short of breath than she feels like is appropriate. She's also says that she gets short of breath when she stands up and reaches for something high on a shelf. She is in a wheelchair due to her cerebral palsy. She has been in a wheelchair since 1986 after an operation on her back. She has no chest pain, but she does have left arm pain which is worsened with movement. She says that she feels like her left arm pain could potentially be due to overexertion.  Today, she denies symptoms of palpitations, chest pain,  orthopnea, PND, lower extremity edema, claudication, dizziness, presyncope, syncope, bleeding, or neurologic sequela. The patient is tolerating medications without difficulties and is otherwise without complaint today.    Past Medical History:  Diagnosis Date  . Arthritis    OA  . Blood transfusion 1992   AFTER  SURGERY TO REMOVE BLADDER  . 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 COLONIC POLYPS 07/11/2010  . Diabetes mellitus    ORAL MED FOR DIABETES  . GERD (gastroesophageal reflux disease)    PAST HISTORY GERD--WATCHES DIET - NOT ON ANY MEDS FOR GERD  . HYPERLIPIDEMIA 11/29/2009  . HYPERTENSION 11/29/2009  . Incisional hernia    NO PAIN-NOT CAUSING ANY  DISCOMFORT  . PONV (postoperative nausea and vomiting)   . Presence of urostomy [facility])    11-28-14 remains   Past Surgical History:  Procedure Laterality Date  . ABDOMINAL HYSTERECTOMY  1990   partial  . APPENDECTOMY    . CHOLECYSTECTOMY    . COLONOSCOPY WITH PROPOFOL N/A 12/07/2014   Procedure: COLONOSCOPY WITH PROPOFOL;  Surgeon: Gatha Mayer, MD;  [facility];  Service: Endoscopy;  Laterality: N/A;  . LEFT CARPAL TUNNEL RELEASE X 2    . LUMBAR SYMPATHETECTOMIES X 2    . NEPHROLITHOTOMY  10/01/2011   Procedure: NEPHROLITHOTOMY PERCUTANEOUS;  Surgeon: Franchot Gallo, MD;  Location: [facility];  Service: Urology;  Laterality: Right;       . radical cystectomy  1992   neurogenic bladder sec CP  . REVISION UROSTOMY CUTANEOUS  1992  . RIGHT CARPAL TUNNEL RELEASE    . RT ANKLE FUSION  1975   SEVERAL RT ANKLE SURGERIES PRIOR TO THE FUSION  . 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 TONSILLECTOMY       Current Outpatient Prescriptions  Medication Sig Dispense Refill  . ACCU-CHEK FASTCLIX LANCETS MISC Use to check blood sugar once a day 100 each 1  . acetaminophen (TYLENOL) 500 MG tablet Take 500 mg by mouth every 6 (six) hours as needed for mild pain. Pain     . aspirin 81 MG tablet Take  81 mg by mouth daily.     Marland Kitchen b complex vitamins capsule Take 1 capsule by mouth daily.     . cetirizine (ZYRTEC) 10 MG tablet Take 1 tablet (10 mg total) by mouth daily as needed. allergies 90 tablet 1  . Cholecalciferol (VITAMIN D) 2000 UNITS CAPS Take 2,000 Units by mouth See admin instructions. Takes only on days Monday through thursday    . furosemide (LASIX) 80 MG tablet Take 80 mg by mouth daily.    Marland Kitchen glucose blood (ACCU-CHEK SMARTVIEW) test strip TEST TWICE DAILY AS DIRECTED 100 each 1  . losartan (COZAAR) 100 MG tablet Take 1 tablet (100 mg total) by mouth daily. 90 tablet 1  . metFORMIN (GLUCOPHAGE) 500 MG tablet TAKE 2 TABLETS BY MOUTH EVERY MORNING AND 2 TABLETS BY  MOUTH EVERY EVENING 1080 tablet 0  . Multiple Vitamin (MULTIVITAMIN) capsule Take 1 capsule by mouth daily.     . Omega-3 Fatty Acids (FISH OIL) 1200 MG CAPS Take 1,200 mg by mouth 2 (two) times daily.     . ondansetron (ZOFRAN ODT) 4 MG disintegrating tablet Take 1 tablet (4 mg total) by mouth every 8 (eight) hours as needed for nausea. 6 tablet 0  . polyethylene glycol powder (GLYCOLAX/MIRALAX) powder Take 17 g by mouth daily. One capfull daily by mouth with liquid    . simvastatin (ZOCOR) 40 MG tablet Take 1 tablet (40 mg total) by mouth daily. 90 tablet 1  . sodium chloride (OCEAN) 0.65 % nasal spray Place 1-2 sprays into the nose as needed. Allergies      No current facility-administered medications for this visit.     Allergies:   Morphine; Baclofen; Lisinopril; Metoclopramide hcl; and Penicillins   Social History:  The patient  reports that she has never smoked. She has never used smokeless tobacco. She reports that she does not drink alcohol or use drugs.   Family History:  The patient's family history includes Bladder Cancer in her father; Colon cancer (age of onset: 76) in her father; Colon polyps in her sister; Emphysema in her father; Heart attack in her maternal grandfather; Heart disease in her father and maternal grandmother; Heart disease (age of onset: 13) in her mother.    ROS:  Please see the history of present illness.   Otherwise, review of systems is positive for DOE, palpitations, DOE.   All other systems are reviewed and negative.    PHYSICAL EXAM: VS:  BP 132/68   Pulse 62   Ht 5\' 9"  (1.753 m)   Wt 182 lb (82.6 kg)   BMI 26.88 kg/m  , BMI Body mass index is 26.88 kg/m. GEN: Well nourished, well developed, in no acute distress  HEENT: normal  Neck: no JVD, carotid bruits, or masses Cardiac: RRR; no murmurs, rubs, or gallops,no edema  Respiratory:  clear to auscultation bilaterally, normal work of breathing GI: soft, nontender, nondistended, + BS MS: no  deformity or atrophy  Skin: warm and dry Neuro:  Strength and sensation are intact Psych: euthymic mood, full affect  EKG:  EKG is ordered today. Personal review of the ekg ordered shows sinus rhythm, rate 62  Recent Labs: 01/04/2016: ALT 25; TSH 1.27 01/15/2016: BUN 40; Creatinine, Ser 1.24; Hemoglobin 11.2; Platelets 379.0; Potassium 3.7; Sodium 139    Lipid Panel     Component Value Date/Time   CHOL 138 01/04/2016 1120   TRIG 182.0 (H) 01/04/2016 1120   HDL 33.70 (L) 01/04/2016 1120   CHOLHDL 4  01/04/2016 1120   VLDL 36.4 01/04/2016 1120   LDLCALC 68 01/04/2016 1120   LDLDIRECT 85.0 06/13/2015 1007     Wt Readings from Last 3 Encounters:  01/31/16 182 lb (82.6 kg)  01/08/16 180 lb (81.6 kg)  01/08/16 180 lb (81.6 kg)      Other studies Reviewed: Additional studies/ records that were reviewed today include: PCP notes    ASSESSMENT AND PLAN:  1.  Dyspnea with exertion: Currently feeling well, but does have increased dyspnea on exertion when she exerts himself with her hand bike or is up and moving around her house. She says that this is abnormal for her. Due to the increased amount of dyspnea, we'll order a TTE to further determine if she has any cardiac causes.  2.  Hypertension: well controlled  3.  Hyperlipidemia: on zocor   Current medicines are reviewed at length with the patient today.   The patient does not have concerns regarding her medicines.  The following changes were made today:  none  Labs/ tests ordered today include:  No orders of the defined types were placed in this encounter.   Disposition:   FU with Kitiara Hintze PRN  Signed, Kanesha Cadle Meredith Leeds, MD  01/31/2016 9:32 AM     [facility] [address] [place] [address] [phone] (office) [phone] (fax)

## 2016-01-31 ENCOUNTER — Encounter: Payer: Self-pay | Admitting: Cardiology

## 2016-01-31 ENCOUNTER — Ambulatory Visit (INDEPENDENT_AMBULATORY_CARE_PROVIDER_SITE_OTHER): Payer: Commercial Managed Care - HMO | Admitting: Cardiology

## 2016-01-31 VITALS — BP 132/68 | HR 62 | Ht 69.0 in | Wt 182.0 lb

## 2016-01-31 DIAGNOSIS — R079 Chest pain, unspecified: Secondary | ICD-10-CM

## 2016-01-31 NOTE — Patient Instructions (Signed)
Medication Instructions:  Your physician recommends that you continue on your current medications as directed. Please refer to the Current Medication list given to you today.   Labwork: None ordered   Testing/Procedures:Your physician has requested that you have an echocardiogram. Echocardiography is a painless test that uses sound waves to create images of your heart. It provides your doctor with information about the size and shape of your heart and how well your heart's chambers and valves are working. This procedure takes approximately one hour. There are no restrictions for this procedure.    Follow-Up: Your physician recommends that you schedule a follow-up appointment as needed   Any Other Special Instructions Will Be Listed Below (If Applicable).     If you need a refill on your cardiac medications before your next appointment, please call your pharmacy.   

## 2016-02-05 DIAGNOSIS — M8589 Other specified disorders of bone density and structure, multiple sites: Secondary | ICD-10-CM | POA: Diagnosis not present

## 2016-02-05 DIAGNOSIS — Z780 Asymptomatic menopausal state: Secondary | ICD-10-CM | POA: Diagnosis not present

## 2016-02-05 LAB — HM DEXA SCAN

## 2016-02-12 ENCOUNTER — Ambulatory Visit (HOSPITAL_COMMUNITY): Payer: Commercial Managed Care - HMO | Attending: Cardiology

## 2016-02-12 ENCOUNTER — Other Ambulatory Visit: Payer: Self-pay

## 2016-02-12 DIAGNOSIS — I340 Nonrheumatic mitral (valve) insufficiency: Secondary | ICD-10-CM | POA: Insufficient documentation

## 2016-02-12 DIAGNOSIS — E119 Type 2 diabetes mellitus without complications: Secondary | ICD-10-CM | POA: Insufficient documentation

## 2016-02-12 DIAGNOSIS — R079 Chest pain, unspecified: Secondary | ICD-10-CM | POA: Diagnosis not present

## 2016-02-12 DIAGNOSIS — E785 Hyperlipidemia, unspecified: Secondary | ICD-10-CM | POA: Diagnosis not present

## 2016-02-12 DIAGNOSIS — I071 Rheumatic tricuspid insufficiency: Secondary | ICD-10-CM | POA: Insufficient documentation

## 2016-02-12 DIAGNOSIS — I119 Hypertensive heart disease without heart failure: Secondary | ICD-10-CM | POA: Insufficient documentation

## 2016-02-13 ENCOUNTER — Encounter: Payer: Self-pay | Admitting: Family Medicine

## 2016-02-21 ENCOUNTER — Encounter: Payer: Self-pay | Admitting: Family Medicine

## 2016-03-03 ENCOUNTER — Other Ambulatory Visit: Payer: Self-pay | Admitting: Family Medicine

## 2016-03-12 DIAGNOSIS — H26492 Other secondary cataract, left eye: Secondary | ICD-10-CM | POA: Diagnosis not present

## 2016-03-12 DIAGNOSIS — Z961 Presence of intraocular lens: Secondary | ICD-10-CM | POA: Diagnosis not present

## 2016-03-12 DIAGNOSIS — H40013 Open angle with borderline findings, low risk, bilateral: Secondary | ICD-10-CM | POA: Diagnosis not present

## 2016-03-12 DIAGNOSIS — E119 Type 2 diabetes mellitus without complications: Secondary | ICD-10-CM | POA: Diagnosis not present

## 2016-03-31 DIAGNOSIS — Z936 Other artificial openings of urinary tract status: Secondary | ICD-10-CM | POA: Diagnosis not present

## 2016-05-30 ENCOUNTER — Telehealth: Payer: Self-pay | Admitting: Family Medicine

## 2016-05-30 DIAGNOSIS — Z021 Encounter for pre-employment examination: Secondary | ICD-10-CM

## 2016-05-30 NOTE — Telephone Encounter (Signed)
Pt is getting a new job and was required to do a drug urine sample. However, with pt's stoma, and her urine leg bag, the temp was too cold.  [facility] has now advised pt to go to her pcp to have the urine sample drawn form the stoma. Is that something we can do here?  Pt also ha mychart if you prefer to communicate that way.

## 2016-05-30 NOTE — Telephone Encounter (Signed)
Can we do this here at the office?

## 2016-06-02 NOTE — Telephone Encounter (Signed)
Employer told pt that since [employer] could not do the urine drug screen that we will need to order this for her and employer will reimburse. She will be here 10am on Wednesday 20th. How should this be ordered?

## 2016-06-02 NOTE — Telephone Encounter (Signed)
There are many different drug screens out there.  Without having information from her prospective employer we don't know how to order.  I would suggest that patient get specifics about what needs to be tested.

## 2016-06-02 NOTE — Telephone Encounter (Signed)
How would you like me to set up this appt? I would like to observe as well if its okay with you and the patient to see how this is done. Thanks.

## 2016-06-02 NOTE — Telephone Encounter (Signed)
OK 

## 2016-06-02 NOTE — Telephone Encounter (Signed)
Please review note and advise.  

## 2016-06-02 NOTE — Telephone Encounter (Signed)
If Dr. Elease Hashimoto provides verbal orders to draw from ostomy bag, I can obtain sample.

## 2016-06-02 NOTE — Telephone Encounter (Signed)
I will be in Wednesday (20th) if she would like to come in then. Prefer her to come in the morning if able. An appointment is not warranted, just make me aware of what time is agreed upon and that Dr. Elease Hashimoto is here that day.

## 2016-06-03 NOTE — Telephone Encounter (Signed)
Pt states that her employer needs a 1-5 panel drug screening.

## 2016-06-03 NOTE — Telephone Encounter (Signed)
I would go ahead and get a basic urine drug screen (I think this is a 10- panel screen).

## 2016-06-03 NOTE — Telephone Encounter (Signed)
FYI: I need to ask lab how to order test.

## 2016-06-04 ENCOUNTER — Other Ambulatory Visit: Payer: Self-pay

## 2016-06-04 ENCOUNTER — Other Ambulatory Visit: Payer: Commercial Managed Care - HMO

## 2016-06-04 DIAGNOSIS — Z021 Encounter for pre-employment examination: Secondary | ICD-10-CM

## 2016-06-04 NOTE — Telephone Encounter (Signed)
Pt had urine test today.

## 2016-06-05 ENCOUNTER — Other Ambulatory Visit: Payer: Self-pay | Admitting: Family Medicine

## 2016-06-12 ENCOUNTER — Telehealth: Payer: Self-pay | Admitting: Family Medicine

## 2016-06-12 NOTE — Telephone Encounter (Signed)
Pt would like results of her urine drug screen

## 2016-06-13 ENCOUNTER — Encounter: Payer: Self-pay | Admitting: Family Medicine

## 2016-06-13 NOTE — Telephone Encounter (Signed)
Per chart UDS has not been resulted. Called [facility], spoke with Gwyndolyn Saxon. He states that the test was ordered with an old code. The UDS was not run. The sample is now 23 days old and no longer viable. He states they did send a fax with this information. He also gave me the new code.   Spoke with Jacquelynn Cree in our lab. He states that the day the fax was received he called [facility]. They advised him that the test had the wrong code. He gave verbal authorization to run the test. They told him they would run it.   He is calling pt to advise, explain and apologize. She will need to come in for recollection of sample.

## 2016-06-13 NOTE — Telephone Encounter (Signed)
Pt call to get the status of Drug test for her new employment.

## 2016-06-19 NOTE — Telephone Encounter (Signed)
Have you heard any further information about this pt coming in?

## 2016-06-19 NOTE — Telephone Encounter (Signed)
[name] -- Work with Jacquelynn Cree to confirm appropriate coding and steps in EPIC to ensure correct coding. Once steps are in place, contact patient today (06/19/16) and have her come in to obtain urine specimen.

## 2016-06-19 NOTE — Telephone Encounter (Signed)
Pt had drug screen at another facility. No further communication at this time. Thanks for your help.

## 2016-06-22 ENCOUNTER — Other Ambulatory Visit: Payer: Self-pay | Admitting: Family Medicine

## 2016-07-04 DIAGNOSIS — Z936 Other artificial openings of urinary tract status: Secondary | ICD-10-CM | POA: Diagnosis not present

## 2016-07-14 ENCOUNTER — Other Ambulatory Visit: Payer: Self-pay | Admitting: Family Medicine

## 2016-08-11 ENCOUNTER — Encounter: Payer: Self-pay | Admitting: Family Medicine

## 2016-08-11 ENCOUNTER — Ambulatory Visit (INDEPENDENT_AMBULATORY_CARE_PROVIDER_SITE_OTHER): Payer: Medicare Other | Admitting: Family Medicine

## 2016-08-11 VITALS — BP 130/50 | HR 80 | Temp 98.3°F

## 2016-08-11 DIAGNOSIS — J069 Acute upper respiratory infection, unspecified: Secondary | ICD-10-CM

## 2016-08-11 DIAGNOSIS — B9789 Other viral agents as the cause of diseases classified elsewhere: Secondary | ICD-10-CM

## 2016-08-11 MED ORDER — HYDROCODONE-HOMATROPINE 5-1.5 MG/5ML PO SYRP: 5.0000 mL | ORAL_SOLUTION | Freq: Four times a day (QID) | ORAL | 0 refills | Status: AC | PRN

## 2016-08-11 NOTE — Patient Instructions (Signed)
Follow up for any fever or increased shortness of breath. 

## 2016-08-11 NOTE — Progress Notes (Signed)
Subjective:     Patient ID: Jordan [name], female   DOB: 03-02-1946, 71 y.o.   MRN: [id number]  HPI Patient seen with 3 day history of productive cough, postnasal drip, fatigue, mild body aches and mild sore throat. Denies any nausea, vomiting, or diarrhea. She took some over-the-counter cough syrup with Delsym with minimal relief. Cough has been fairly severe at night. No obvious wheezing. Allergy list includes morphine but she apparently has taken hydrocodone without difficulty in the past. It is not clear at all she had any anaphylaxis with morphine even though this was initially listed. She denied any rash, angioedema symptoms, or blood pressure instability with morphine previously She has tried Tessalon for cough without improvement  Past Medical History:  Diagnosis Date  . Arthritis    OA  . Blood transfusion 1992   AFTER  SURGERY TO REMOVE BLADDER  . 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 COLONIC POLYPS 07/11/2010  . Diabetes mellitus    ORAL MED FOR DIABETES  . GERD (gastroesophageal reflux disease)    PAST HISTORY GERD--WATCHES DIET - NOT ON ANY MEDS FOR GERD  . HYPERLIPIDEMIA 11/29/2009  . HYPERTENSION 11/29/2009  . Incisional hernia    NO PAIN-NOT CAUSING ANY DISCOMFORT  . PONV (postoperative nausea and vomiting)   . Presence of urostomy [facility])    11-28-14 remains   Past Surgical History:  Procedure Laterality Date  . ABDOMINAL HYSTERECTOMY  1990   partial  . APPENDECTOMY    . CHOLECYSTECTOMY    . COLONOSCOPY WITH PROPOFOL N/A 12/07/2014   Procedure: COLONOSCOPY WITH PROPOFOL;  Surgeon: Gatha Mayer, MD;  [facility];  Service: Endoscopy;  Laterality: N/A;  . LEFT CARPAL TUNNEL RELEASE X 2    . LUMBAR SYMPATHETECTOMIES X 2    . NEPHROLITHOTOMY  10/01/2011   Procedure: NEPHROLITHOTOMY PERCUTANEOUS;  Surgeon: Franchot Gallo, MD;  [facility];  Service: Urology;  Laterality: Right;       . radical cystectomy  1992   neurogenic bladder sec CP  . REVISION UROSTOMY CUTANEOUS  1992  . RIGHT CARPAL TUNNEL RELEASE    . RT ANKLE FUSION  1975   SEVERAL RT ANKLE SURGERIES PRIOR TO THE FUSION  . 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 TONSILLECTOMY      reports that she has never smoked. She has never used smokeless tobacco. She reports that she does not drink alcohol or use drugs. family history includes Bladder Cancer in her father; Colon cancer (age of onset: 68) in her father; Colon polyps in her sister; Emphysema in her father; Heart attack in her maternal grandfather; Heart disease in her father and maternal grandmother; Heart disease (age of onset: 101) in her mother. Allergies  Allergen Reactions  . Morphine Shortness Of Breath  . Baclofen     GI upset  . Lisinopril Cough  . Metoclopramide Hcl     Reglan, irritable  . Penicillins     " MAJOR CASE OF HIVES"     Review of Systems  Constitutional: Positive for fatigue. Negative for chills.  HENT: Positive for postnasal drip.   Respiratory: Positive for cough. Negative for wheezing.        Objective:   Physical Exam  Constitutional: She appears well-developed and well-nourished.  HENT:  Right Ear: External ear normal.  Left Ear:  External ear normal.  Mouth/Throat: Oropharynx is clear and moist.  Neck: Neck supple.  Cardiovascular: Normal rate and regular rhythm.   Pulmonary/Chest: Effort normal and breath sounds normal. No respiratory distress. She has no wheezes. She has no rales.  Lymphadenopathy:    She has no cervical adenopathy.       Assessment:     Cough. Suspect acute viral URI with cough. Nonfocal exam    Plan:     -Stay well-hydrated -Hycodan cough syrup 1 teaspoon daily at bedtime for severe cough  [name] MD [facility]

## 2016-08-29 DIAGNOSIS — Z936 Other artificial openings of urinary tract status: Secondary | ICD-10-CM | POA: Diagnosis not present

## 2016-09-03 ENCOUNTER — Ambulatory Visit (INDEPENDENT_AMBULATORY_CARE_PROVIDER_SITE_OTHER): Payer: Medicare Other | Admitting: Family Medicine

## 2016-09-03 VITALS — BP 120/60 | HR 82 | Temp 98.5°F

## 2016-09-03 DIAGNOSIS — R05 Cough: Secondary | ICD-10-CM

## 2016-09-03 DIAGNOSIS — R059 Cough, unspecified: Secondary | ICD-10-CM

## 2016-09-03 MED ORDER — DOXYCYCLINE HYCLATE 100 MG PO CAPS: 100.0000 mg | ORAL_CAPSULE | Freq: Two times a day (BID) | ORAL | 0 refills | Status: DC

## 2016-09-03 NOTE — Progress Notes (Signed)
Subjective:     Patient ID: Jordan [name], female   DOB: [birth year], 71 y.o.   MRN: [id number]  HPI  Patient is a nonsmoker seen with about 4 week history of cough. She was seen February 26 and felt to have viral URI. We prescribed Hycodan cough syrup which has helped at night. Her cough currently tends to be worse during the day and at night. She now has cough productive of yellow sputum and also has had some frequent yellowish nasal discharge. Occasional headaches. No fever. No hemoptysis. No dyspnea. No wheezing. Frequent postnasal drip symptoms. No GERD symptoms. No ACE inhibitor use. No appetite or weight changes. Nasacort over-the-counter without relief.  Patient has never smoked but has had a long history of secondhand smoke  Past Medical History:  Diagnosis Date  . Arthritis    OA  . Blood transfusion 1992   AFTER  SURGERY TO REMOVE BLADDER  . 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 COLONIC POLYPS 07/11/2010  . Diabetes mellitus    ORAL MED FOR DIABETES  . GERD (gastroesophageal reflux disease)    PAST HISTORY GERD--WATCHES DIET - NOT ON ANY MEDS FOR GERD  . HYPERLIPIDEMIA 11/29/2009  . HYPERTENSION 11/29/2009  . Incisional hernia    NO PAIN-NOT CAUSING ANY DISCOMFORT  . PONV (postoperative nausea and vomiting)   . Presence of urostomy [facility])    11-28-14 remains   Past Surgical History:  Procedure Laterality Date  . ABDOMINAL HYSTERECTOMY  1990   partial  . APPENDECTOMY    . CHOLECYSTECTOMY    . COLONOSCOPY WITH PROPOFOL N/A 12/07/2014   Procedure: COLONOSCOPY WITH PROPOFOL;  Surgeon: Gatha Mayer, MD;  [facility];  Service: Endoscopy;  Laterality: N/A;  . LEFT CARPAL TUNNEL RELEASE X 2    . LUMBAR SYMPATHETECTOMIES X 2    . NEPHROLITHOTOMY  10/01/2011   Procedure: NEPHROLITHOTOMY PERCUTANEOUS;  Surgeon: Franchot Gallo, MD;  [facility];   Service: Urology;  Laterality: Right;       . radical cystectomy  1992   neurogenic bladder sec CP  . REVISION UROSTOMY CUTANEOUS  1992  . RIGHT CARPAL TUNNEL RELEASE    . RT ANKLE FUSION  1975   SEVERAL RT ANKLE SURGERIES PRIOR TO THE FUSION  . 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 TONSILLECTOMY      reports that she has never smoked. She has never used smokeless tobacco. She reports that she does not drink alcohol or use drugs. family history includes Bladder Cancer in her father; Colon cancer (age of onset: 13) in her father; Colon polyps in her sister; Emphysema in her father; Heart attack in her maternal grandfather; Heart disease in her father and maternal grandmother; Heart disease (age of onset: 77) in her mother. Allergies  Allergen Reactions  . Morphine Shortness Of Breath  . Baclofen     GI upset  . Lisinopril Cough  . Metoclopramide Hcl     Reglan, irritable  . Penicillins     " MAJOR CASE OF HIVES"    Review of Systems  Constitutional: Negative for chills and fever.  HENT: Positive for congestion and postnasal drip.   Respiratory: Positive for cough. Negative for shortness of breath and wheezing.   Cardiovascular: Negative for chest pain.  Neurological: Negative for dizziness.  Objective:   Physical Exam  Constitutional: She appears well-developed and well-nourished.  HENT:  Right Ear: External ear normal.  Left Ear: External ear normal.  Mouth/Throat: Oropharynx is clear and moist.  Neck: Neck supple.  Cardiovascular: Normal rate and regular rhythm.   Pulmonary/Chest: Effort normal and breath sounds normal. No respiratory distress. She has no wheezes. She has no rales.  Musculoskeletal: She exhibits no edema.  Lymphadenopathy:    She has no cervical adenopathy.       Assessment:     Cough. Suspect either post viral or postnasal drip related. May have acute sinusitis    Plan:     -Doxycycline 100 mg twice a day for 10  days -Recommend try over-the-counter chlorpheniramine 4 mg daily at bedtime for postnasal drip symptoms -Touch base in 2-3 weeks if cough not resolved -Consider chest x-ray if cough not better in 2 weeks  [name] MD [facility] at [facility]

## 2016-09-03 NOTE — Patient Instructions (Signed)
Try OTC Chlorpheniramine 4 mg at night for postnasal drip symptoms Let me know if cough not better in 2-3 weeks.

## 2016-09-03 NOTE — Progress Notes (Signed)
Pre visit review using our clinic review tool, if applicable. No additional management support is needed unless otherwise documented below in the visit note. 

## 2016-09-08 ENCOUNTER — Other Ambulatory Visit: Payer: Self-pay | Admitting: Family Medicine

## 2016-09-27 ENCOUNTER — Emergency Department (HOSPITAL_COMMUNITY)
Admission: EM | Admit: 2016-09-27 | Discharge: 2016-09-27 | Disposition: A | Discharge status: Home / Self Care | Payer: Medicare Other | Attending: Emergency Medicine | Admitting: Emergency Medicine

## 2016-09-27 ENCOUNTER — Encounter (HOSPITAL_COMMUNITY): Payer: Self-pay

## 2016-09-27 DIAGNOSIS — S81811A Laceration without foreign body, right lower leg, initial encounter: Secondary | ICD-10-CM

## 2016-09-27 DIAGNOSIS — Y929 Unspecified place or not applicable: Secondary | ICD-10-CM | POA: Diagnosis not present

## 2016-09-27 DIAGNOSIS — Z7984 Long term (current) use of oral hypoglycemic drugs: Secondary | ICD-10-CM | POA: Diagnosis not present

## 2016-09-27 DIAGNOSIS — T148XXA Other injury of unspecified body region, initial encounter: Secondary | ICD-10-CM | POA: Diagnosis not present

## 2016-09-27 DIAGNOSIS — S91011A Laceration without foreign body, right ankle, initial encounter: Secondary | ICD-10-CM | POA: Insufficient documentation

## 2016-09-27 DIAGNOSIS — I10 Essential (primary) hypertension: Secondary | ICD-10-CM | POA: Insufficient documentation

## 2016-09-27 DIAGNOSIS — Y999 Unspecified external cause status: Secondary | ICD-10-CM | POA: Diagnosis not present

## 2016-09-27 DIAGNOSIS — Y939 Activity, unspecified: Secondary | ICD-10-CM | POA: Insufficient documentation

## 2016-09-27 DIAGNOSIS — Z7982 Long term (current) use of aspirin: Secondary | ICD-10-CM | POA: Diagnosis not present

## 2016-09-27 DIAGNOSIS — E119 Type 2 diabetes mellitus without complications: Secondary | ICD-10-CM | POA: Insufficient documentation

## 2016-09-27 DIAGNOSIS — W228XXA Striking against or struck by other objects, initial encounter: Secondary | ICD-10-CM | POA: Diagnosis not present

## 2016-09-27 DIAGNOSIS — S99911A Unspecified injury of right ankle, initial encounter: Secondary | ICD-10-CM | POA: Diagnosis not present

## 2016-09-27 MED ORDER — BACITRACIN ZINC 500 UNIT/GM EX OINT
TOPICAL_OINTMENT | CUTANEOUS | Status: AC
  Filled 2016-09-27: qty 6.3

## 2016-09-27 MED ORDER — LIDOCAINE-EPINEPHRINE (PF) 2 %-1:200000 IJ SOLN
10.0000 mL | Freq: Once | INTRAMUSCULAR | Status: AC
  Administered 2016-09-27: 10 mL
  Filled 2016-09-27: qty 20

## 2016-09-27 MED ORDER — TETANUS-DIPHTH-ACELL PERTUSSIS 5-2.5-18.5 LF-MCG/0.5 IM SUSP
0.5000 mL | Freq: Once | INTRAMUSCULAR | Status: AC
Start: 2016-09-27 — End: 2016-09-27
  Administered 2016-09-27: 0.5 mL via INTRAMUSCULAR
  Filled 2016-09-27: qty 0.5

## 2016-09-27 NOTE — Discharge Instructions (Signed)
Please monitor for signs of infection: redness, swelling, pain, or fevers. Return to the emergency department if symptoms worsen; or you notice signs of infection or wound separation  Keep your wound dry for the next 48 hours.  After 48 hours you may rinse your wound with clean water and mild soap as needed.  Elevate your leg and ice to decrease inflammation.  Take tylenol 1000 mg for pain every 8 hours as needed for pain.   Contact your primary care provider as soon as possible and make an appointment in 2-3 days for a wound check.  You will need to get your sutures out by your primary care provider in 7-10 days.

## 2016-09-27 NOTE — ED Provider Notes (Signed)
[facility] DEPT Provider Note   CSN: [id number] Arrival date & time: 09/27/16  1344   By signing my name below, I, Eunice Blase, attest that this documentation has been prepared under the direction and in the presence of Carmon Sails, PA-C. Electronically Signed: Eunice Blase, Scribe. 09/27/16. 2:59 PM.   History   Chief Complaint Chief Complaint  Patient presents with  . Laceration   The history is provided by the patient and medical records. No language interpreter was used.    Jordan [name] is a 71 y.o. female with h/o a fused ankle, non insulin dependent T2DM, multiple surgeries on RLE and ankle, transported via EMS to the Emergency Department with medial R ankle laceration. She states she hit her ankle on the edge of a door that had a sharp object on it. Mild pain noted, worse with contact. Steady blood flow to the area reported initially. Bleeding controlled with dressing. Baseline R foot numbness and R ankle pain noted. No anticoagulant use noted.  Past Medical History:  Diagnosis Date  . Arthritis    OA  . Blood transfusion 1992   AFTER  SURGERY TO REMOVE BLADDER  . 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 COLONIC POLYPS 07/11/2010  . Diabetes mellitus    ORAL MED FOR DIABETES  . GERD (gastroesophageal reflux disease)    PAST HISTORY GERD--WATCHES DIET - NOT ON ANY MEDS FOR GERD  . HYPERLIPIDEMIA 11/29/2009  . HYPERTENSION 11/29/2009  . Incisional hernia    NO PAIN-NOT CAUSING ANY DISCOMFORT  . PONV (postoperative nausea and vomiting)   . Presence of urostomy [facility])    11-28-14 remains    Patient Active Problem List   Diagnosis Date Noted  . Hx of colonic polyps   . Type 2 diabetes mellitus, controlled ([place]) 08/04/2014  . Pyelonephritis 07/08/2014  . History of kidney stones 06/12/2014  . Hypercalcemia 06/12/2014  . Sepsis secondary to UTI  ([place]) 12/03/2011  . 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 Type 2 diabetes mellitus ([place]) 10/09/2010  . Infantile cerebral palsy ([place]) 07/11/2010  . Hyperlipidemia 11/29/2009  . Essential hypertension 11/29/2009    Past Surgical History:  Procedure Laterality Date  . ABDOMINAL HYSTERECTOMY  1990   partial  . APPENDECTOMY    . CHOLECYSTECTOMY    . COLONOSCOPY WITH PROPOFOL N/A 12/07/2014   Procedure: COLONOSCOPY WITH PROPOFOL;  Surgeon: Gatha Mayer, MD;  Location: [facility];  Service: Endoscopy;  Laterality: N/A;  . LEFT CARPAL TUNNEL RELEASE X 2    . LUMBAR SYMPATHETECTOMIES X 2    . NEPHROLITHOTOMY  10/01/2011   Procedure: NEPHROLITHOTOMY PERCUTANEOUS;  Surgeon: Franchot Gallo, MD;  Location: [facility];  Service: Urology;  Laterality: Right;       . radical cystectomy  1992   neurogenic bladder sec CP  . REVISION UROSTOMY CUTANEOUS  1992  . RIGHT CARPAL TUNNEL RELEASE    . RT ANKLE FUSION  1975   SEVERAL RT ANKLE SURGERIES PRIOR TO THE FUSION  . 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 TONSILLECTOMY      OB History    No data available       Home Medications    Prior to Admission medications   Medication Sig Start Date End Date Taking? Authorizing Provider  ACCU-CHEK FASTCLIX LANCETS MISC Use to check blood sugar once  a day 09/04/15   [name], MD  ACCU-CHEK FASTCLIX LANCETS MISC USE TO CHECK BLOOD SUGAR ONCE DAILY 06/05/16   Marletta Lor, MD  acetaminophen (TYLENOL) 500 MG tablet Take 500 mg by mouth every 6 (six) hours as needed for mild pain. Pain     Historical Provider, MD  aspirin 81 MG tablet Take 81 mg by mouth daily.     Historical Provider, MD  b complex vitamins capsule Take 1 capsule by mouth daily.     Historical Provider, MD  cetirizine (ZYRTEC) 10 MG tablet Take 1 tablet (10 mg total) by mouth daily as needed. allergies 07/26/15   [name], MD  Cholecalciferol (VITAMIN D) 2000 UNITS CAPS Take 2,000 Units by  mouth See admin instructions. Takes only on days Monday through thursday    Historical Provider, MD  doxycycline (VIBRAMYCIN) 100 MG capsule Take 1 capsule (100 mg total) by mouth 2 (two) times daily. 09/03/16   [name], MD  furosemide (LASIX) 80 MG tablet Take 80 mg by mouth daily.    Historical Provider, MD  furosemide (LASIX) 80 MG tablet TAKE 1 TABLET(80 MG) BY MOUTH TWICE DAILY 09/08/16   [name], MD  glucose blood (ACCU-CHEK SMARTVIEW) test strip Dx: 250.00 03/04/16   [name], MD  losartan (COZAAR) 100 MG tablet Take 1 tablet (100 mg total) by mouth daily. 07/26/15   [name], MD  metFORMIN (GLUCOPHAGE) 500 MG tablet TAKE 2 TABLETS BY MOUTH EVERY MORNING AND 2 TABLETS BY MOUTH EVERY EVENING 01/21/16   [name], MD  Multiple Vitamin (MULTIVITAMIN) capsule Take 1 capsule by mouth daily.     Historical Provider, MD  Omega-3 Fatty Acids (FISH OIL) 1200 MG CAPS Take 1,200 mg by mouth 2 (two) times daily.     Historical Provider, MD  ondansetron (ZOFRAN ODT) 4 MG disintegrating tablet Take 1 tablet (4 mg total) by mouth every 8 (eight) hours as needed for nausea. 01/08/16   Tanna Furry, MD  polyethylene glycol powder (GLYCOLAX/MIRALAX) powder Take 17 g by mouth daily. One capfull daily by mouth with liquid    Historical Provider, MD  simvastatin (ZOCOR) 40 MG tablet TAKE 1 TABLET(40 MG) BY MOUTH DAILY 06/23/16   [name], MD  sodium chloride (OCEAN) 0.65 % nasal spray Place 1-2 sprays into the nose as needed. Allergies     Historical Provider, MD    Family History Family History  Problem Relation Age of Onset  . Heart disease Mother 75    COPD  . Heart disease Father     COPD   . Emphysema Father   . Colon cancer Father 37    Died at 47  . Bladder Cancer Father   . Colon polyps Sister   . Heart disease Maternal Grandmother   . Heart attack Maternal Grandfather   . Esophageal cancer Neg Hx   . Kidney disease Neg Hx     Social  History Social History  Substance Use Topics  . Smoking status: Never Smoker  . Smokeless tobacco: Never Used  . Alcohol use No     Allergies   Morphine; Baclofen; Lisinopril; Metoclopramide hcl; and Penicillins   Review of Systems Review of Systems  Musculoskeletal: Positive for arthralgias (mild).  Skin: Positive for wound.  Hematological: Does not bruise/bleed easily.  All other systems reviewed and are negative.    Physical Exam Updated Vital Signs BP (!) 157/53 (BP Location: Left Arm)  Pulse 76   Resp 14   SpO2 98%   Physical Exam  Constitutional: She is oriented to person, place, and time. She appears well-developed and well-nourished.  HENT:  Head: Normocephalic and atraumatic.  Eyes: Conjunctivae are normal. Pupils are equal, round, and reactive to light. Right eye exhibits no discharge. Left eye exhibits no discharge. No scleral icterus.  Neck: Normal range of motion. No JVD present. No tracheal deviation present.  Cardiovascular:  Symmetric lower extremity edema, chronic  Pulmonary/Chest: Effort normal. No stridor.  Musculoskeletal:       Right ankle: She exhibits decreased range of motion and laceration.  Patient has decreased right ankle ROM (h/o ankle fusion) Patient able to wiggle right toes without pain No bony tenderness over lateral or medial malleoli or metatarsals   Neurological: She is alert and oriented to person, place, and time. She displays abnormal reflex. A sensory deficit is present.  Reflex Scores:      Achilles reflexes are 0 on the right side. Patient has baseline decreased sensation to light touch and strength to the right lower extremity 4/5 strength with ankle dorsiflexion and plantar flexion on R compared to L (unchanged from baseline).  Decreased sensation to light touch in the distribution of the obturator nerve, lateral cutaneous nerve, femoral nerve, common fibular nerve on the R compared to L (unchanged from baseline) No R ankle  DTR  Left foot: decreased sensation to light touch in the distribution of the saphenous nerve, medial plantar nerve, lateral plantar nerve (unchanged from baseline)  Skin: Laceration noted.  8 cm L shaped laceration to the medial right ankle, bleeding controlled with Surgicel and pressure  Psychiatric: She has a normal mood and affect. Her behavior is normal. Judgment and thought content normal.  Nursing note and vitals reviewed.    ED Treatments / Results  DIAGNOSTIC STUDIES:   COORDINATION OF CARE: 2:29 PM Discussed treatment plan with pt at bedside and pt agreed to plan. Will consult attending physician.  Labs (all labs ordered are listed, but only abnormal results are displayed) Labs Reviewed - No data to display  EKG  EKG Interpretation None       Radiology No results found.  Procedures .Marland KitchenLaceration Repair Date/Time: 09/27/2016 3:34 PM Performed by: Kinnie Feil Authorized by: Kinnie Feil   Consent:    Consent obtained:  Verbal   Consent given by:  Patient   Risks discussed:  Pain, poor wound healing and infection Anesthesia (see MAR for exact dosages):    Anesthesia method:  Local infiltration   Local anesthetic:  Lidocaine 1% w/o epi Laceration details:    Location:  Foot   Foot location:  R ankle   Length (cm):  8 Repair type:    Repair type:  Simple Pre-procedure details:    Preparation:  Patient was prepped and draped in usual sterile fashion Treatment:    Area cleansed with:  Saline   Amount of cleaning:  Standard   Irrigation solution: .5 L saline and iodine.   Irrigation method:  Syringe   Visualized foreign bodies/material removed: no   Skin repair:    Repair method:  Sutures   Suture size:  4-0   Wound skin closure material used: ethilon.   Suture technique:  Simple interrupted   Number of sutures:  17 Post-procedure details:    Dressing:  Bulky dressing and antibiotic ointment   Patient tolerance of procedure:  Tolerated  well, no immediate complications       (  including critical care time)  Medications Ordered in ED Medications  lidocaine-EPINEPHrine (XYLOCAINE W/EPI) 2 %-1:200000 (PF) injection 10 mL (10 mLs Infiltration Given 09/27/16 1647)  Tdap (BOOSTRIX) injection 0.5 mL (0.5 mLs Intramuscular Given 09/27/16 1456)     Initial Impression / Assessment and Plan / ED Course  I have reviewed the triage vital signs and the nursing notes.  Pertinent labs & imaging results that were available during my care of the patient were reviewed by me and considered in my medical decision making (see chart for details).    Patient is a 71 y.o. yo female that presents with laceration to right medial ankle. Tdap booster given. Pressure irrigation performed. Bottom of the wound visualized with bleeding controled, no foreign bodies seen.  No tendon injury. Patient notes that she's had multiple surgeries to her right lower extremity including a right ankle fusion. She notes she has decreased sensation to light touch and decreased range of motion in the right ankle at baseline. On exam patient is able to wiggle her right toes, has decreased sensation to light touch in the right foot and decreased strength with plantar flexion and dorsiflexion. Patient notes that this is her baseline and is unchanged.. Laceration occurred < 12 hours prior to repair which was well tolerated. Patient has h/o DM which may contribute to poor healing and higher risk for complications. Discussed suture home care with pt and answered questions. Given history of decreased sensation to the right lower extremity and history of diabetes I advised patient to present to her PCP office in 48-72 hours for a wound check to reassess and monitor for signs of infection or dehiscence. Patient verbalized understanding and will plan on seeing her PCP in 2-3 days.    Final Clinical Impressions(s) / ED Diagnoses   Final diagnoses:  Laceration of right lower  extremity, initial encounter    New Prescriptions Discharge Medication List as of 09/27/2016  5:16 PM     I personally performed the services described in this documentation, which was scribed in my presence. The recorded information has been reviewed and is accurate.    Kinnie Feil, PA-C 09/27/16 2036    Malvin Johns, MD 09/28/16 0700

## 2016-09-27 NOTE — ED Notes (Signed)
Supplies for suturing at bedside.

## 2016-09-27 NOTE — ED Triage Notes (Signed)
She caught her ankle/foot area on a door jam (her bathroom) this morning, thereby lac. Her ant. Right ankle area. She arrives with a dressing, which I do not remove.

## 2016-09-29 ENCOUNTER — Encounter: Payer: Self-pay | Admitting: Family Medicine

## 2016-09-29 ENCOUNTER — Ambulatory Visit (INDEPENDENT_AMBULATORY_CARE_PROVIDER_SITE_OTHER): Payer: Medicare Other | Admitting: Family Medicine

## 2016-09-29 VITALS — BP 160/64 | HR 100 | Temp 98.4°F

## 2016-09-29 DIAGNOSIS — R600 Localized edema: Secondary | ICD-10-CM

## 2016-09-29 DIAGNOSIS — S81811A Laceration without foreign body, right lower leg, initial encounter: Secondary | ICD-10-CM

## 2016-09-29 MED ORDER — POTASSIUM CHLORIDE ER 10 MEQ PO TBCR: 10.0000 meq | EXTENDED_RELEASE_TABLET | Freq: Every day | ORAL | 5 refills | Status: DC

## 2016-09-29 NOTE — Progress Notes (Signed)
Pre visit review using our clinic review tool, if applicable. No additional management support is needed unless otherwise documented below in the visit note. 

## 2016-09-29 NOTE — Progress Notes (Signed)
Subjective:     Patient ID: Jordan [name], female   DOB: 11/10/1945, 71 y.o.   MRN: [id number]  HPI Patient seen for ER follow-up. This past Saturday she was at a church event and went to the bathroom. She got her right foot caught up under a bathroom stall door and apparently when she went to move the leg there was a piece of sharp metal on the stall  door that caught against her lower leg region and she had a large flap laceration. She was taken by EMS to emergency room for further evaluation. She had 18 sutures placed. Tetanus booster was given. She was told to follow-up closely here especially with her history of type 2 diabetes and peripheral neuropathy.  She's had no fevers or chills. No drainage. No pain. Normally uses support hose daily. She took 80 mg of Lasix twice daily yesterday. Normally takes 80 mg once daily. Does not take any potassium supplement.  Past Medical History:  Diagnosis Date  . Arthritis    OA  . Blood transfusion 1992   AFTER  SURGERY TO REMOVE BLADDER  . 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 COLONIC POLYPS 07/11/2010  . Diabetes mellitus    ORAL MED FOR DIABETES  . GERD (gastroesophageal reflux disease)    PAST HISTORY GERD--WATCHES DIET - NOT ON ANY MEDS FOR GERD  . HYPERLIPIDEMIA 11/29/2009  . HYPERTENSION 11/29/2009  . Incisional hernia    NO PAIN-NOT CAUSING ANY DISCOMFORT  . PONV (postoperative nausea and vomiting)   . Presence of urostomy [facility])    11-28-14 remains   Past Surgical History:  Procedure Laterality Date  . ABDOMINAL HYSTERECTOMY  1990   partial  . APPENDECTOMY    . CHOLECYSTECTOMY    . COLONOSCOPY WITH PROPOFOL N/A 12/07/2014   Procedure: COLONOSCOPY WITH PROPOFOL;  Surgeon: Gatha Mayer, MD;  [facility];  Service: Endoscopy;  Laterality: N/A;  . LEFT CARPAL TUNNEL RELEASE X 2    . LUMBAR SYMPATHETECTOMIES  X 2    . NEPHROLITHOTOMY  10/01/2011   Procedure: NEPHROLITHOTOMY PERCUTANEOUS;  Surgeon: Franchot Gallo, MD;  [facility];  Service: Urology;  Laterality: Right;       . radical cystectomy  1992   neurogenic bladder sec CP  . REVISION UROSTOMY CUTANEOUS  1992  . RIGHT CARPAL TUNNEL RELEASE    . RT ANKLE FUSION  1975   SEVERAL RT ANKLE SURGERIES PRIOR TO THE FUSION  . 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 TONSILLECTOMY      reports that she has never smoked. She has never used smokeless tobacco. She reports that she does not drink alcohol or use drugs. family history includes Bladder Cancer in her father; Colon cancer (age of onset: 75) in her father; Colon polyps in her sister; Emphysema in her father; Heart attack in her maternal grandfather; Heart disease in her father and maternal grandmother; Heart disease (age of onset: 27) in her mother. Allergies  Allergen Reactions  . Morphine Shortness Of Breath  . Baclofen     GI upset  . Lisinopril Cough  . Metoclopramide Hcl     Reglan, irritable  . Penicillins     " MAJOR CASE OF HIVES"     Review of Systems  Constitutional: Negative for chills, fatigue and fever.  Eyes: Negative for  visual disturbance.  Respiratory: Negative for cough, chest tightness, shortness of breath and wheezing.   Cardiovascular: Positive for leg swelling. Negative for chest pain and palpitations.  Neurological: Negative for dizziness, seizures, syncope, weakness, light-headedness and headaches.       Objective:   Physical Exam  Constitutional: She appears well-developed and well-nourished.  Cardiovascular: Normal rate and regular rhythm.   Pulmonary/Chest: Effort normal and breath sounds normal. No respiratory distress. She has no wheezes. She has no rales.  Skin:  Patient has large flap laceration right lower medial leg. 18 sutures intact. Skin appears able. No necrosis. No drainage. No warmth.No skin erythema.        Assessment:     #1 large flap laceration right lower leg healing well no signs of secondary infection  #2 chronic bilateral leg edema which has worsened since she stopped her support hose.  #3 hypertension slightly elevated today but generally well-controlled     Plan:     -Elevate legs frequently  -Dressing reapplied to right lower leg  -Follow-up in 7 or 8 days for suture removal  -Check basic metabolic panel  -Add K-Lor 10 mEq 1 daily while she is on extra dose of Lasix  -Follow-up sooner for any signs of secondary infection   [name] MD [place] [facility]

## 2016-09-29 NOTE — Patient Instructions (Signed)
Keep clean with soap and water. Elevate foot/ankle frequently Follow up in 8 days and sooner for any signs of infection such as increased redness, heat, or drainage

## 2016-10-07 ENCOUNTER — Encounter: Payer: Self-pay | Admitting: Family Medicine

## 2016-10-07 ENCOUNTER — Ambulatory Visit (INDEPENDENT_AMBULATORY_CARE_PROVIDER_SITE_OTHER): Payer: Medicare Other | Admitting: Family Medicine

## 2016-10-07 VITALS — BP 128/62 | HR 84 | Temp 98.4°F | Wt 185.0 lb

## 2016-10-07 DIAGNOSIS — S81811D Laceration without foreign body, right lower leg, subsequent encounter: Secondary | ICD-10-CM | POA: Diagnosis not present

## 2016-10-07 NOTE — Progress Notes (Signed)
Pre visit review using our clinic review tool, if applicable. No additional management support is needed unless otherwise documented below in the visit note. 

## 2016-10-07 NOTE — Patient Instructions (Addendum)
   WE NOW OFFER   [facility] FAST TRACK!!!  SAME DAY Appointments for ACUTE CARE  Such as: Sprains, Injuries, cuts, abrasions, rashes, muscle pain, joint pain, back pain Colds, flu, sore throats, headache, allergies, cough, fever  Ear pain, sinus and eye infections Abdominal pain, nausea, vomiting, diarrhea, upset stomach Animal/insect bites  3 Easy Ways to Schedule: Walk-In Scheduling Call in scheduling Mychart Sign-up: [email or web address]    Leave steri-strips in place until they start to peel off Keep area around wound clean with soap and water.

## 2016-10-07 NOTE — Progress Notes (Signed)
Subjective:     Patient ID: Jordan [name], female   DOB: 1945/12/25, 71 y.o.   MRN: [id number]  HPI Patient here for follow-up regarding recent complex wound right lower leg. Injury occurred 10 days ago. She has been changing outer dressing daily. No signs of secondary infection. She's had some lower extremity swelling on the right leg secondary to not being able to apply her usual compression hose. She's had minimal drainage. No fevers or chills.  Past Medical History:  Diagnosis Date  . Arthritis    OA  . Blood transfusion 1992   AFTER  SURGERY TO REMOVE BLADDER  . 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 COLONIC POLYPS 07/11/2010  . Diabetes mellitus    ORAL MED FOR DIABETES  . GERD (gastroesophageal reflux disease)    PAST HISTORY GERD--WATCHES DIET - NOT ON ANY MEDS FOR GERD  . HYPERLIPIDEMIA 11/29/2009  . HYPERTENSION 11/29/2009  . Incisional hernia    NO PAIN-NOT CAUSING ANY DISCOMFORT  . PONV (postoperative nausea and vomiting)   . Presence of urostomy [facility])    11-28-14 remains   Past Surgical History:  Procedure Laterality Date  . ABDOMINAL HYSTERECTOMY  1990   partial  . APPENDECTOMY    . CHOLECYSTECTOMY    . COLONOSCOPY WITH PROPOFOL N/A 12/07/2014   Procedure: COLONOSCOPY WITH PROPOFOL;  Surgeon: Gatha Mayer, MD;  [facility];  Service: Endoscopy;  Laterality: N/A;  . LEFT CARPAL TUNNEL RELEASE X 2    . LUMBAR SYMPATHETECTOMIES X 2    . NEPHROLITHOTOMY  10/01/2011   Procedure: NEPHROLITHOTOMY PERCUTANEOUS;  Surgeon: Franchot Gallo, MD;  [facility];  Service: Urology;  Laterality: Right;       . radical cystectomy  1992   neurogenic bladder sec CP  . REVISION UROSTOMY CUTANEOUS  1992  . RIGHT CARPAL TUNNEL RELEASE    . RT ANKLE FUSION  1975   SEVERAL RT ANKLE SURGERIES PRIOR TO THE FUSION  . SURGERY TO CORRECT RT HIP  CONTRACTURE--AT [facility]     . TONSILLECTOMY      reports that she has never smoked. She has never used smokeless tobacco. She reports that she does not drink alcohol or use drugs. family history includes Bladder Cancer in her father; Colon cancer (age of onset: 74) in her father; Colon polyps in her sister; Emphysema in her father; Heart attack in her maternal grandfather; Heart disease in her father and maternal grandmother; Heart disease (age of onset: 51) in her mother. Allergies  Allergen Reactions  . Morphine Shortness Of Breath  . Baclofen     GI upset  . Lisinopril Cough  . Metoclopramide Hcl     Reglan, irritable  . Penicillins     " MAJOR CASE OF HIVES"     Review of Systems  Constitutional: Negative for chills and fever.       Objective:   Physical Exam  Constitutional: She appears well-developed and well-nourished.  Cardiovascular: Normal rate and regular rhythm.   Pulmonary/Chest: Effort normal and breath sounds normal. No respiratory distress. She has no wheezes. She has no rales.  Skin:  Patient has large flap laceration right anterior leg. Edges are somewhat dusky. There is no significant erythema. No drainage. No warmth. 18 sutures are removed.       Assessment:     Patient seen for follow-up regarding  large laceration right anterior leg. No signs of secondary infection    Plan:     -18 sutures removed -We applied some Steri-Strips for additional reinforcement until wound further heals -Keep clean around the wound with soap and water daily -Reassess in one week and sooner as needed -Elevate leg frequently  [name] MD [place] [facility] at [facility]

## 2016-10-14 ENCOUNTER — Ambulatory Visit (INDEPENDENT_AMBULATORY_CARE_PROVIDER_SITE_OTHER): Payer: Medicare Other | Admitting: Family Medicine

## 2016-10-14 VITALS — BP 120/84 | HR 76 | Temp 98.4°F

## 2016-10-14 DIAGNOSIS — S81811D Laceration without foreign body, right lower leg, subsequent encounter: Secondary | ICD-10-CM

## 2016-10-14 NOTE — Progress Notes (Signed)
Subjective:     Patient ID: Jordan [name], female   DOB: 1945/12/27, 71 y.o.   MRN: [id number]  HPI Follow-up large wound right leg from previous laceration. We removed sutures last visit and wound was not healing very well near the edges. We applied Steri-Strips. They're changing outer dressing daily. She's had no necrosis. No pain. Minimal drainage. No fevers or chills.  Past Medical History:  Diagnosis Date  . Arthritis    OA  . Blood transfusion 1992   AFTER  SURGERY TO REMOVE BLADDER  . 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 COLONIC POLYPS 07/11/2010  . Diabetes mellitus    ORAL MED FOR DIABETES  . GERD (gastroesophageal reflux disease)    PAST HISTORY GERD--WATCHES DIET - NOT ON ANY MEDS FOR GERD  . HYPERLIPIDEMIA 11/29/2009  . HYPERTENSION 11/29/2009  . Incisional hernia    NO PAIN-NOT CAUSING ANY DISCOMFORT  . PONV (postoperative nausea and vomiting)   . Presence of urostomy [facility])    11-28-14 remains   Past Surgical History:  Procedure Laterality Date  . ABDOMINAL HYSTERECTOMY  1990   partial  . APPENDECTOMY    . CHOLECYSTECTOMY    . COLONOSCOPY WITH PROPOFOL N/A 12/07/2014   Procedure: COLONOSCOPY WITH PROPOFOL;  Surgeon: Gatha Mayer, MD;  [facility];  Service: Endoscopy;  Laterality: N/A;  . LEFT CARPAL TUNNEL RELEASE X 2    . LUMBAR SYMPATHETECTOMIES X 2    . NEPHROLITHOTOMY  10/01/2011   Procedure: NEPHROLITHOTOMY PERCUTANEOUS;  Surgeon: Franchot Gallo, MD;  [facility];  Service: Urology;  Laterality: Right;       . radical cystectomy  1992   neurogenic bladder sec CP  . REVISION UROSTOMY CUTANEOUS  1992  . RIGHT CARPAL TUNNEL RELEASE    . RT ANKLE FUSION  1975   SEVERAL RT ANKLE SURGERIES PRIOR TO THE FUSION  . 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 TONSILLECTOMY      reports that she has never smoked.  She has never used smokeless tobacco. She reports that she does not drink alcohol or use drugs. family history includes Bladder Cancer in her father; Colon cancer (age of onset: 5) in her father; Colon polyps in her sister; Emphysema in her father; Heart attack in her maternal grandfather; Heart disease in her father and maternal grandmother; Heart disease (age of onset: 27) in her mother. Allergies  Allergen Reactions  . Morphine Shortness Of Breath  . Baclofen     GI upset  . Lisinopril Cough  . Metoclopramide Hcl     Reglan, irritable  . Penicillins     " MAJOR CASE OF HIVES"     Review of Systems  Constitutional: Negative for chills and fever.       Objective:   Physical Exam  Constitutional: She appears well-developed and well-nourished.  Cardiovascular: Normal rate and regular rhythm.   Pulmonary/Chest: Effort normal and breath sounds normal. No respiratory distress. She has no wheezes. She has no rales.  Skin:  Right leg reveals large flap laceration with Steri-Strips in place. No cellulitis changes. Somewhat dusky around the edges but no necrosis. No foul odor. She has some chronic leg edema which is unchanged       Assessment:     Slowly healing large flap laceration right leg without secondary infection  Plan:     -We'll leave Steri-Strips in place-until follow up. -Continue daily dressing changes -Reassess in 8 days and sooner for any signs of secondary infection  [name] MD [facility] at [facility]

## 2016-10-14 NOTE — Progress Notes (Signed)
Pre visit review using our clinic review tool, if applicable. No additional management support is needed unless otherwise documented below in the visit note. 

## 2016-10-23 ENCOUNTER — Other Ambulatory Visit: Payer: Self-pay | Admitting: Family Medicine

## 2016-10-24 ENCOUNTER — Ambulatory Visit (INDEPENDENT_AMBULATORY_CARE_PROVIDER_SITE_OTHER): Payer: Medicare Other | Admitting: Family Medicine

## 2016-10-24 ENCOUNTER — Encounter: Payer: Self-pay | Admitting: Family Medicine

## 2016-10-24 VITALS — BP 130/80 | HR 84 | Temp 98.6°F

## 2016-10-24 DIAGNOSIS — G809 Cerebral palsy, unspecified: Secondary | ICD-10-CM | POA: Diagnosis not present

## 2016-10-24 DIAGNOSIS — E119 Type 2 diabetes mellitus without complications: Secondary | ICD-10-CM

## 2016-10-24 DIAGNOSIS — S81801D Unspecified open wound, right lower leg, subsequent encounter: Secondary | ICD-10-CM | POA: Diagnosis not present

## 2016-10-24 LAB — POCT GLYCOSYLATED HEMOGLOBIN (HGB A1C): Hemoglobin A1C: 6.9

## 2016-10-24 NOTE — Progress Notes (Signed)
Subjective:     Patient ID: Jordan [name], female   DOB: 01-01-1946, 71 y.o.   MRN: [id number]  HPI Patient seen for a wound recheck. Initial injury occurred on April 14. She had large flap laceration left leg. She does have type diabetes. Steri-Strips are still in place. She's not had any redness or significant drainage. No foul odor. No pain. Blood sugars been stable. No leg pain.  Diabetes has been well controlled. She remains on metformin. Last A1c 6.4% Unable to get much exercise.  Patient has history of cerebral palsy. She has been in a wheelchair for years. Requesting order for new wheelchair as her current one is having multiple mechanical problems.  Past Medical History:  Diagnosis Date  . Arthritis    OA  . Blood transfusion 1992   AFTER  SURGERY TO REMOVE BLADDER  . 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 COLONIC POLYPS 07/11/2010  . Diabetes mellitus    ORAL MED FOR DIABETES  . GERD (gastroesophageal reflux disease)    PAST HISTORY GERD--WATCHES DIET - NOT ON ANY MEDS FOR GERD  . HYPERLIPIDEMIA 11/29/2009  . HYPERTENSION 11/29/2009  . Incisional hernia    NO PAIN-NOT CAUSING ANY DISCOMFORT  . PONV (postoperative nausea and vomiting)   . Presence of urostomy [facility])    11-28-14 remains   Past Surgical History:  Procedure Laterality Date  . ABDOMINAL HYSTERECTOMY  1990   partial  . APPENDECTOMY    . CHOLECYSTECTOMY    . COLONOSCOPY WITH PROPOFOL N/A 12/07/2014   Procedure: COLONOSCOPY WITH PROPOFOL;  Surgeon: Gatha Mayer, MD;  [facility];  Service: Endoscopy;  Laterality: N/A;  . LEFT CARPAL TUNNEL RELEASE X 2    . LUMBAR SYMPATHETECTOMIES X 2    . NEPHROLITHOTOMY  10/01/2011   Procedure: NEPHROLITHOTOMY PERCUTANEOUS;  Surgeon: Franchot Gallo, MD;  [facility];  Service: Urology;  Laterality: Right;       . radical cystectomy   1992   neurogenic bladder sec CP  . REVISION UROSTOMY CUTANEOUS  1992  . RIGHT CARPAL TUNNEL RELEASE    . RT ANKLE FUSION  1975   SEVERAL RT ANKLE SURGERIES PRIOR TO THE FUSION  . 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 TONSILLECTOMY      reports that she has never smoked. She has never used smokeless tobacco. She reports that she does not drink alcohol or use drugs. family history includes Bladder Cancer in her father; Colon cancer (age of onset: 8) in her father; Colon polyps in her sister; Emphysema in her father; Heart attack in her maternal grandfather; Heart disease in her father and maternal grandmother; Heart disease (age of onset: 60) in her mother. Allergies  Allergen Reactions  . Morphine Shortness Of Breath  . Baclofen     GI upset  . Lisinopril Cough  . Metoclopramide Hcl     Reglan, irritable  . Penicillins     " MAJOR CASE OF HIVES"     Review of Systems  Constitutional: Negative for chills, fatigue and fever.  Eyes: Negative for visual disturbance.  Respiratory: Negative for cough, chest tightness, shortness of breath and wheezing.   Cardiovascular: Negative for chest pain, palpitations and leg swelling.  Endocrine: Negative for polydipsia and polyuria.  Neurological: Negative for dizziness, seizures, syncope, weakness, light-headedness and headaches.  Objective:   Physical Exam  Constitutional: She appears well-developed and well-nourished.  Cardiovascular: Normal rate and regular rhythm.   Pulmonary/Chest: Effort normal and breath sounds normal. No respiratory distress. She has no wheezes. She has no rales.  Skin:  Patient has large laceration left leg which is healing well. No necrosis. No erythema. No warmth. Steri-Strips in place.       Assessment:     #1 type 2 diabetes. A1c today 6.9%  #2 large flap laceration left leg slowly healing with no signs of secondary infection  #3 history of cerebral palsy with chronic wheelchair  use. Patient requesting new wheelchair today. Will initiate orders    Plan:     -Wound is redressed and continue with daily dressing changes -Reassess once more in 2 weeks and sooner free signs of secondary infection -A1c 6.9% as above -Initiate orders for new wheel chair  [name] MD [facility] at [facility]

## 2016-11-03 ENCOUNTER — Telehealth: Payer: Self-pay | Admitting: Family Medicine

## 2016-11-03 NOTE — Telephone Encounter (Signed)
Left message on machine for patient.  Paperwork was re faxed and confirmed.

## 2016-11-03 NOTE — Telephone Encounter (Signed)
Pt is calling needing a seating evaluation with [facility].  Pt stated that she thought that it was going to be done the day of her appointment on 10/24/16.

## 2016-11-07 ENCOUNTER — Encounter: Payer: Self-pay | Admitting: Family Medicine

## 2016-11-07 ENCOUNTER — Ambulatory Visit (INDEPENDENT_AMBULATORY_CARE_PROVIDER_SITE_OTHER): Payer: Medicare Other | Admitting: Family Medicine

## 2016-11-07 VITALS — BP 120/74 | HR 96 | Temp 98.5°F

## 2016-11-07 DIAGNOSIS — S81801D Unspecified open wound, right lower leg, subsequent encounter: Secondary | ICD-10-CM

## 2016-11-07 NOTE — Patient Instructions (Signed)
Continue to keep wound clean with soap and water Follow up in 2 weeks and sooner for any evidence for infection.

## 2016-11-07 NOTE — Progress Notes (Signed)
Subjective:     Patient ID: Jordan [name], female   DOB: May 29, 1946, 71 y.o.   MRN: [id number]  HPI Patient seen for follow-up regarding leg wound with injury which occurred April 14. This has been slow to heal. She has type 2 diabetes which has been well controlled. No signs of secondary infection. Steri-Strips are finally falling off. No drainage. No redness. No fevers or chills.  Patient needs a new wheelchair. She needs physical therapy assessment for power wheelchair and is in process of getting that set up. Her current wheelchair has several mechanical problems  Past Medical History:  Diagnosis Date  . Arthritis    OA  . Blood transfusion 1992   AFTER  SURGERY TO REMOVE BLADDER  . 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 COLONIC POLYPS 07/11/2010  . Diabetes mellitus    ORAL MED FOR DIABETES  . GERD (gastroesophageal reflux disease)    PAST HISTORY GERD--WATCHES DIET - NOT ON ANY MEDS FOR GERD  . HYPERLIPIDEMIA 11/29/2009  . HYPERTENSION 11/29/2009  . Incisional hernia    NO PAIN-NOT CAUSING ANY DISCOMFORT  . PONV (postoperative nausea and vomiting)   . Presence of urostomy [facility])    11-28-14 remains   Past Surgical History:  Procedure Laterality Date  . ABDOMINAL HYSTERECTOMY  1990   partial  . APPENDECTOMY    . CHOLECYSTECTOMY    . COLONOSCOPY WITH PROPOFOL N/A 12/07/2014   Procedure: COLONOSCOPY WITH PROPOFOL;  Surgeon: Gatha Mayer, MD;  [facility];  Service: Endoscopy;  Laterality: N/A;  . LEFT CARPAL TUNNEL RELEASE X 2    . LUMBAR SYMPATHETECTOMIES X 2    . NEPHROLITHOTOMY  10/01/2011   Procedure: NEPHROLITHOTOMY PERCUTANEOUS;  Surgeon: Franchot Gallo, MD;  [facility];  Service: Urology;  Laterality: Right;       . radical cystectomy  1992   neurogenic bladder sec CP  . REVISION UROSTOMY CUTANEOUS  1992  . RIGHT CARPAL TUNNEL  RELEASE    . RT ANKLE FUSION  1975   SEVERAL RT ANKLE SURGERIES PRIOR TO THE FUSION  . 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 TONSILLECTOMY      reports that she has never smoked. She has never used smokeless tobacco. She reports that she does not drink alcohol or use drugs. family history includes Bladder Cancer in her father; Colon cancer (age of onset: 50) in her father; Colon polyps in her sister; Emphysema in her father; Heart attack in her maternal grandfather; Heart disease in her father and maternal grandmother; Heart disease (age of onset: 41) in her mother. Allergies  Allergen Reactions  . Morphine Shortness Of Breath  . Baclofen     GI upset  . Lisinopril Cough  . Metoclopramide Hcl     Reglan, irritable  . Penicillins     " MAJOR CASE OF HIVES"     Review of Systems  Constitutional: Negative for chills and fever.       Objective:   Physical Exam  Constitutional: She appears well-developed and well-nourished.  Cardiovascular: Normal rate and regular rhythm.   Skin:  Right anterior leg wound is slowly improving. Still not completely closed over. No redness. No necrosis. 2 Steri-Strips removed. No warmth.       Assessment:     Right leg wound following laceration April 14. Slow  to heal. 2 remaining Steri-Strips removed today.    Plan:     -Continue to clean gently around the wound daily with soap and water. -Would still leave off compression stockings at this time until fully healed Reassess in 2 weeks-sooner for signs of infection.  [name] MD [place] at [facility]

## 2016-11-25 ENCOUNTER — Ambulatory Visit (INDEPENDENT_AMBULATORY_CARE_PROVIDER_SITE_OTHER): Payer: Medicare Other | Admitting: Family Medicine

## 2016-11-25 ENCOUNTER — Encounter: Payer: Self-pay | Admitting: Family Medicine

## 2016-11-25 VITALS — BP 120/74 | HR 67 | Temp 97.8°F

## 2016-11-25 DIAGNOSIS — S81801D Unspecified open wound, right lower leg, subsequent encounter: Secondary | ICD-10-CM | POA: Diagnosis not present

## 2016-11-25 NOTE — Progress Notes (Signed)
Subjective:     Patient ID: Jordan [name], female   DOB: [birth year], 71 y.o.   MRN: [id number]  HPI   Patient seen for follow-up regarding right leg laceration which has been slow to heal. She does have type 2 diabetes but well controlled. No history of peripheral vascular disease. Nonsmoker. She's not had any drainage or erythema. Her slow healing is complicated by some chronic leg edema. Unable to wear compression stockings. Initial injury occurred around 416/18  Past Medical History:  Diagnosis Date  . Arthritis    OA  . Blood transfusion 1992   AFTER  SURGERY TO REMOVE BLADDER  . 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 COLONIC POLYPS 07/11/2010  . Diabetes mellitus    ORAL MED FOR DIABETES  . GERD (gastroesophageal reflux disease)    PAST HISTORY GERD--WATCHES DIET - NOT ON ANY MEDS FOR GERD  . HYPERLIPIDEMIA 11/29/2009  . HYPERTENSION 11/29/2009  . Incisional hernia    NO PAIN-NOT CAUSING ANY DISCOMFORT  . PONV (postoperative nausea and vomiting)   . Presence of urostomy [facility])    11-28-14 remains   Past Surgical History:  Procedure Laterality Date  . ABDOMINAL HYSTERECTOMY  1990   partial  . APPENDECTOMY    . CHOLECYSTECTOMY    . COLONOSCOPY WITH PROPOFOL N/A 12/07/2014   Procedure: COLONOSCOPY WITH PROPOFOL;  Surgeon: Gatha Mayer, MD;  [facility];  Service: Endoscopy;  Laterality: N/A;  . LEFT CARPAL TUNNEL RELEASE X 2    . LUMBAR SYMPATHETECTOMIES X 2    . NEPHROLITHOTOMY  10/01/2011   Procedure: NEPHROLITHOTOMY PERCUTANEOUS;  Surgeon: Franchot Gallo, MD;  [facility];  Service: Urology;  Laterality: Right;       . radical cystectomy  1992   neurogenic bladder sec CP  . REVISION UROSTOMY CUTANEOUS  1992  . RIGHT CARPAL TUNNEL RELEASE    . RT ANKLE FUSION  1975   SEVERAL RT ANKLE SURGERIES PRIOR TO THE FUSION  . SURGERY TO  CORRECT RT HIP CONTRACTURE--AT [facility]     . TONSILLECTOMY      reports that she has never smoked. She has never used smokeless tobacco. She reports that she does not drink alcohol or use drugs. family history includes Bladder Cancer in her father; Colon cancer (age of onset: 74) in her father; Colon polyps in her sister; Emphysema in her father; Heart attack in her maternal grandfather; Heart disease in her father and maternal grandmother; Heart disease (age of onset: 69) in her mother. Allergies  Allergen Reactions  . Morphine Shortness Of Breath  . Baclofen     GI upset  . Lisinopril Cough  . Metoclopramide Hcl     Reglan, irritable  . Penicillins     " MAJOR CASE OF HIVES"     Review of Systems  Constitutional: Negative for chills and fever.       Objective:   Physical Exam  Constitutional: She appears well-developed and well-nourished.  Cardiovascular: Normal rate and regular rhythm.   Skin:  Patient has flap laceration right lower leg with one component 3 cm in length and the other side 4 cm. This is much more superficial and slowly healing. No erythema. No drainage. No necrosis       Assessment:     Slowly healing laceration right leg in type II diabetic with chronic leg edema  Plan:     -Recommend trial of duoderm colloidal dressing and this was applied today and reassess in 6 days -elevate leg frequently.  [name] MD [facility] [facility] at [facility]

## 2016-12-01 ENCOUNTER — Ambulatory Visit (INDEPENDENT_AMBULATORY_CARE_PROVIDER_SITE_OTHER): Payer: Medicare Other | Admitting: Family Medicine

## 2016-12-01 ENCOUNTER — Encounter: Payer: Self-pay | Admitting: Family Medicine

## 2016-12-01 DIAGNOSIS — S81801D Unspecified open wound, right lower leg, subsequent encounter: Secondary | ICD-10-CM | POA: Diagnosis not present

## 2016-12-01 NOTE — Progress Notes (Signed)
Subjective:     Patient ID: John Giovanni, female   DOB: [birth year], 71 y.o.   MRN: [id number]  HPI Patient here regarding her right leg wound. She has type 2 diabetes as well as chronic lower extremity edema which are likely slowing healing. We applied Duoderm at her last visit. She has no pain. No fever. No drainage. Elevating leg as much as possible. She spends basically all of her time in a wheelchair.  Blood sugar fairly well controlled with A1c 6.9%  Past Medical History:  Diagnosis Date  . Arthritis    OA  . Blood transfusion 1992   AFTER  SURGERY TO REMOVE BLADDER  . 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 COLONIC POLYPS 07/11/2010  . Diabetes mellitus    ORAL MED FOR DIABETES  . GERD (gastroesophageal reflux disease)    PAST HISTORY GERD--WATCHES DIET - NOT ON ANY MEDS FOR GERD  . HYPERLIPIDEMIA 11/29/2009  . HYPERTENSION 11/29/2009  . Incisional hernia    NO PAIN-NOT CAUSING ANY DISCOMFORT  . PONV (postoperative nausea and vomiting)   . Presence of urostomy [facility])    11-28-14 remains   Past Surgical History:  Procedure Laterality Date  . ABDOMINAL HYSTERECTOMY  1990   partial  . APPENDECTOMY    . CHOLECYSTECTOMY    . COLONOSCOPY WITH PROPOFOL N/A 12/07/2014   Procedure: COLONOSCOPY WITH PROPOFOL;  Surgeon: Gatha Mayer, MD;  [facility];  Service: Endoscopy;  Laterality: N/A;  . LEFT CARPAL TUNNEL RELEASE X 2    . LUMBAR SYMPATHETECTOMIES X 2    . NEPHROLITHOTOMY  10/01/2011   Procedure: NEPHROLITHOTOMY PERCUTANEOUS;  Surgeon: Franchot Gallo, MD;  [facility];  Service: Urology;  Laterality: Right;       . radical cystectomy  1992   neurogenic bladder sec CP  . REVISION UROSTOMY CUTANEOUS  1992  . RIGHT CARPAL TUNNEL RELEASE    . RT ANKLE FUSION  1975   SEVERAL RT ANKLE SURGERIES PRIOR TO THE FUSION  . SURGERY TO CORRECT RT HIP  CONTRACTURE--AT [facility]     . TONSILLECTOMY      reports that she has never smoked. She has never used smokeless tobacco. She reports that she does not drink alcohol or use drugs. family history includes Bladder Cancer in her father; Colon cancer (age of onset: 53) in her father; Colon polyps in her sister; Emphysema in her father; Heart attack in her maternal grandfather; Heart disease in her father and maternal grandmother; Heart disease (age of onset: 37) in her mother. Allergies  Allergen Reactions  . Morphine Shortness Of Breath  . Baclofen     GI upset  . Lisinopril Cough  . Metoclopramide Hcl     Reglan, irritable  . Penicillins     " MAJOR CASE OF HIVES"     Review of Systems  Constitutional: Negative for chills and fever.       Objective:   Physical Exam  Constitutional: She appears well-developed and well-nourished.  Cardiovascular: Normal rate and regular rhythm.   Skin:  Leg wound not much change from last visit. No erythema. No drainage. No necrosis. Wound appears to be fairly superficial.       Assessment:     Slowly healing right leg wound from injury. She has chronic edema which is likely contributing to slow healing    Plan:     -  Reapply colloidal dressing and Ace wrap right lower extremity -Continue with frequent elevation Reassess in one week  [name] MD [place] [facility] at [facility]

## 2016-12-08 ENCOUNTER — Ambulatory Visit (INDEPENDENT_AMBULATORY_CARE_PROVIDER_SITE_OTHER): Payer: Medicare Other | Admitting: Family Medicine

## 2016-12-08 VITALS — BP 110/78 | HR 73 | Temp 98.7°F

## 2016-12-08 DIAGNOSIS — S81801D Unspecified open wound, right lower leg, subsequent encounter: Secondary | ICD-10-CM | POA: Diagnosis not present

## 2016-12-08 NOTE — Patient Instructions (Signed)
Try daily wet to dry dressing changes, as discussed.

## 2016-12-08 NOTE — Progress Notes (Signed)
Subjective:     Patient ID: Jordan [name], female   DOB: 1945/09/06, 71 y.o.   MRN: [id number]  HPI Seen for follow-up regarding right leg wound. Her injury occurred in April with laceration. Has been very slow to heal. Healing slowed by chronic leg edema. We recently tried colloidal dressing and she's not seen much improvement yet. No signs of infection.  Type 2 diabetes which has been well controlled  Past Medical History:  Diagnosis Date  . Arthritis    OA  . Blood transfusion 1992   AFTER  SURGERY TO REMOVE BLADDER  . 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 COLONIC POLYPS 07/11/2010  . Diabetes mellitus    ORAL MED FOR DIABETES  . GERD (gastroesophageal reflux disease)    PAST HISTORY GERD--WATCHES DIET - NOT ON ANY MEDS FOR GERD  . HYPERLIPIDEMIA 11/29/2009  . HYPERTENSION 11/29/2009  . Incisional hernia    NO PAIN-NOT CAUSING ANY DISCOMFORT  . PONV (postoperative nausea and vomiting)   . Presence of urostomy [facility])    11-28-14 remains   Past Surgical History:  Procedure Laterality Date  . ABDOMINAL HYSTERECTOMY  1990   partial  . APPENDECTOMY    . CHOLECYSTECTOMY    . COLONOSCOPY WITH PROPOFOL N/A 12/07/2014   Procedure: COLONOSCOPY WITH PROPOFOL;  Surgeon: Gatha Mayer, MD;  [facility];  Service: Endoscopy;  Laterality: N/A;  . LEFT CARPAL TUNNEL RELEASE X 2    . LUMBAR SYMPATHETECTOMIES X 2    . NEPHROLITHOTOMY  10/01/2011   Procedure: NEPHROLITHOTOMY PERCUTANEOUS;  Surgeon: Franchot Gallo, MD;  [facility];  Service: Urology;  Laterality: Right;       . radical cystectomy  1992   neurogenic bladder sec CP  . REVISION UROSTOMY CUTANEOUS  1992  . RIGHT CARPAL TUNNEL RELEASE    . RT ANKLE FUSION  1975   SEVERAL RT ANKLE SURGERIES PRIOR TO THE FUSION  . 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 TONSILLECTOMY      reports that she has never smoked. She has never used smokeless tobacco. She reports that she does not drink alcohol or use drugs. family history includes Bladder Cancer in her father; Colon cancer (age of onset: 73) in her father; Colon polyps in her sister; Emphysema in her father; Heart attack in her maternal grandfather; Heart disease in her father and maternal grandmother; Heart disease (age of onset: 47) in her mother. Allergies  Allergen Reactions  . Morphine Shortness Of Breath  . Baclofen     GI upset  . Lisinopril Cough  . Metoclopramide Hcl     Reglan, irritable  . Penicillins     " MAJOR CASE OF HIVES"     Review of Systems  Constitutional: Negative for chills and fever.       Objective:   Physical Exam  Constitutional: She appears well-developed and well-nourished.  Cardiovascular: Normal rate and regular rhythm.   Pulmonary/Chest: Effort normal and breath sounds normal. No respiratory distress. She has no wheezes. She has no rales.  Musculoskeletal: She exhibits edema.  Skin:  Patient has wound right leg which is minimally changed from last visit. She has some whitish exudative material near the center and this was debrided with surgical scissors without difficulty. No necrosis. No foul odor. No surrounding erythema  Assessment:     #1 slow to heal wound right leg from recent laceration  #2 type 2 diabetes well controlled.    Plan:     -Schedule follow-up in one month and obtain further labs including lipids, A1c, hepatic panel, basic metabolic panel -Surgical debridement as above of wound -Start saline wet-to-dry dressing changes daily -Set up referral to wound care center -Follow-up immediately for any signs of secondary infection such as redness, warmth, drainage.  [name] MD [place] [facility] at [facility]

## 2016-12-23 ENCOUNTER — Encounter (HOSPITAL_BASED_OUTPATIENT_CLINIC_OR_DEPARTMENT_OTHER): Payer: Medicare Other | Attending: Surgery

## 2016-12-23 DIAGNOSIS — Z981 Arthrodesis status: Secondary | ICD-10-CM | POA: Diagnosis not present

## 2016-12-23 DIAGNOSIS — Z7984 Long term (current) use of oral hypoglycemic drugs: Secondary | ICD-10-CM | POA: Insufficient documentation

## 2016-12-23 DIAGNOSIS — R600 Localized edema: Secondary | ICD-10-CM | POA: Diagnosis not present

## 2016-12-23 DIAGNOSIS — L97812 Non-pressure chronic ulcer of other part of right lower leg with fat layer exposed: Secondary | ICD-10-CM | POA: Insufficient documentation

## 2016-12-23 DIAGNOSIS — E11622 Type 2 diabetes mellitus with other skin ulcer: Secondary | ICD-10-CM | POA: Diagnosis not present

## 2016-12-23 DIAGNOSIS — G809 Cerebral palsy, unspecified: Secondary | ICD-10-CM | POA: Insufficient documentation

## 2016-12-23 DIAGNOSIS — E119 Type 2 diabetes mellitus without complications: Secondary | ICD-10-CM | POA: Insufficient documentation

## 2016-12-23 DIAGNOSIS — I87311 Chronic venous hypertension (idiopathic) with ulcer of right lower extremity: Secondary | ICD-10-CM | POA: Insufficient documentation

## 2016-12-23 DIAGNOSIS — I890 Lymphedema, not elsewhere classified: Secondary | ICD-10-CM | POA: Insufficient documentation

## 2016-12-30 DIAGNOSIS — E119 Type 2 diabetes mellitus without complications: Secondary | ICD-10-CM | POA: Diagnosis not present

## 2016-12-30 DIAGNOSIS — Z981 Arthrodesis status: Secondary | ICD-10-CM | POA: Diagnosis not present

## 2016-12-30 DIAGNOSIS — E11622 Type 2 diabetes mellitus with other skin ulcer: Secondary | ICD-10-CM | POA: Diagnosis not present

## 2016-12-30 DIAGNOSIS — I890 Lymphedema, not elsewhere classified: Secondary | ICD-10-CM | POA: Diagnosis not present

## 2016-12-30 DIAGNOSIS — Z7984 Long term (current) use of oral hypoglycemic drugs: Secondary | ICD-10-CM | POA: Diagnosis not present

## 2016-12-30 DIAGNOSIS — G809 Cerebral palsy, unspecified: Secondary | ICD-10-CM | POA: Diagnosis not present

## 2016-12-30 DIAGNOSIS — I87311 Chronic venous hypertension (idiopathic) with ulcer of right lower extremity: Secondary | ICD-10-CM | POA: Diagnosis not present

## 2016-12-30 DIAGNOSIS — L97812 Non-pressure chronic ulcer of other part of right lower leg with fat layer exposed: Secondary | ICD-10-CM | POA: Diagnosis not present

## 2017-01-01 DIAGNOSIS — R293 Abnormal posture: Secondary | ICD-10-CM | POA: Diagnosis not present

## 2017-01-01 DIAGNOSIS — S81801D Unspecified open wound, right lower leg, subsequent encounter: Secondary | ICD-10-CM | POA: Diagnosis not present

## 2017-01-01 DIAGNOSIS — G809 Cerebral palsy, unspecified: Secondary | ICD-10-CM | POA: Diagnosis not present

## 2017-01-01 DIAGNOSIS — R2689 Other abnormalities of gait and mobility: Secondary | ICD-10-CM | POA: Diagnosis not present

## 2017-01-06 DIAGNOSIS — E11622 Type 2 diabetes mellitus with other skin ulcer: Secondary | ICD-10-CM | POA: Diagnosis not present

## 2017-01-06 DIAGNOSIS — I890 Lymphedema, not elsewhere classified: Secondary | ICD-10-CM | POA: Diagnosis not present

## 2017-01-06 DIAGNOSIS — E119 Type 2 diabetes mellitus without complications: Secondary | ICD-10-CM | POA: Diagnosis not present

## 2017-01-06 DIAGNOSIS — Z981 Arthrodesis status: Secondary | ICD-10-CM | POA: Diagnosis not present

## 2017-01-06 DIAGNOSIS — Z936 Other artificial openings of urinary tract status: Secondary | ICD-10-CM | POA: Diagnosis not present

## 2017-01-06 DIAGNOSIS — Z7984 Long term (current) use of oral hypoglycemic drugs: Secondary | ICD-10-CM | POA: Diagnosis not present

## 2017-01-06 DIAGNOSIS — I87311 Chronic venous hypertension (idiopathic) with ulcer of right lower extremity: Secondary | ICD-10-CM | POA: Diagnosis not present

## 2017-01-06 DIAGNOSIS — G809 Cerebral palsy, unspecified: Secondary | ICD-10-CM | POA: Diagnosis not present

## 2017-01-06 DIAGNOSIS — L97812 Non-pressure chronic ulcer of other part of right lower leg with fat layer exposed: Secondary | ICD-10-CM | POA: Diagnosis not present

## 2017-01-08 ENCOUNTER — Other Ambulatory Visit: Payer: Self-pay | Admitting: Family Medicine

## 2017-01-08 NOTE — Progress Notes (Signed)
Subjective:   Jordan [name] is a 71 y.o. female who presents for Medicare Annual (Subsequent) preventive examination.  Review of Systems:  No ROS.  Medicare Wellness Visit. Additional risk factors are reflected in the social history.  Cardiac Risk Factors include: advanced age (>53men, >42 women);diabetes mellitus;dyslipidemia;hypertension;obesity (BMI >30kg/m2);sedentary lifestyle;family history of premature cardiovascular disease   Sleep patterns: Sleeps 7 hours, feels rested.  Home Safety/Smoke Alarms: Feels safe in home. Smoke alarms in place.  Living environment; residence and Firearm Safety: Lives with husband in Handicapped apartment on first floor. Seat Belt Safety/Bike Helmet: Wears seat belt.   Counseling:   Eye Exam-Last exam < 1 year. Dr. Herbert Deaner. Will call for report Dental-Last exam 10/2016, every 6 month. Dr. Johnn Hai  Female:   Pap-N/A    Mammo- 09/12/2015, negative. Will schedule    Dexa scan-02/05/2016, Osteopenia       [facility]S-Colonoscopy 12/07/14, polyp. Recall 5 years.      Objective:     Vitals: BP 120/60 (BP Location: Left Arm, Patient Position: Sitting, Cuff Size: Large)   Pulse 88   Temp 98.6 F (37 C) (Oral)   SpO2 97%   There is no height or weight on file to calculate BMI.   Tobacco History  Smoking Status  . Never Smoker  Smokeless Tobacco  . Never Used     Counseling given: Not Answered   Past Medical History:  Diagnosis Date  . Arthritis    OA  . Blood transfusion 1992   AFTER  SURGERY TO REMOVE BLADDER  . 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 COLONIC POLYPS 07/11/2010  . Diabetes mellitus    ORAL MED FOR DIABETES  . GERD (gastroesophageal reflux disease)    PAST HISTORY GERD--WATCHES DIET - NOT ON ANY MEDS FOR GERD  . HYPERLIPIDEMIA 11/29/2009  . HYPERTENSION 11/29/2009  . Incisional hernia    NO PAIN-NOT CAUSING  ANY DISCOMFORT  . PONV (postoperative nausea and vomiting)   . Presence of urostomy [facility])    11-28-14 remains   Past Surgical History:  Procedure Laterality Date  . ABDOMINAL HYSTERECTOMY  1990   partial  . APPENDECTOMY    . CHOLECYSTECTOMY    . COLONOSCOPY WITH PROPOFOL N/A 12/07/2014   Procedure: COLONOSCOPY WITH PROPOFOL;  Surgeon: Gatha Mayer, MD;  Location: [facility];  Service: Endoscopy;  Laterality: N/A;  . LEFT CARPAL TUNNEL RELEASE X 2    . LUMBAR SYMPATHETECTOMIES X 2    . NEPHROLITHOTOMY  10/01/2011   Procedure: NEPHROLITHOTOMY PERCUTANEOUS;  Surgeon: Franchot Gallo, MD;  Location: [facility];  Service: Urology;  Laterality: Right;       . radical cystectomy  1992   neurogenic bladder sec CP  . REVISION UROSTOMY CUTANEOUS  1992  . RIGHT CARPAL TUNNEL RELEASE    . RT ANKLE FUSION  1975   SEVERAL RT ANKLE SURGERIES PRIOR TO THE FUSION  . 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 TONSILLECTOMY     Family History  Problem Relation Age of Onset  . Heart disease Mother 58       COPD  . Heart disease Father        COPD   . Emphysema Father   . Colon cancer Father 7       Died at 33  . Bladder Cancer Father   .  Colon polyps Sister   . Heart disease Maternal Grandmother   . Heart attack Maternal Grandfather   . Esophageal cancer Neg Hx   . Kidney disease Neg Hx    History  Sexual Activity  . Sexual activity: Not on file    Outpatient Encounter Prescriptions as of 01/09/2017  Medication Sig  . ACCU-CHEK FASTCLIX LANCETS MISC Use to check blood sugar once a day  . acetaminophen (TYLENOL) 500 MG tablet Take 500 mg by mouth every 6 (six) hours as needed for mild pain. Pain   . aspirin 81 MG tablet Take 81 mg by mouth daily.   Marland Kitchen b complex vitamins capsule Take 1 capsule by mouth daily.   . cetirizine (ZYRTEC) 10 MG tablet Take 1 tablet (10 mg total) by mouth daily as needed. allergies  . Cholecalciferol (VITAMIN D) 2000 UNITS CAPS Take 2,000 Units  by mouth See admin instructions. Takes only on days Monday through thursday  . furosemide (LASIX) 80 MG tablet Take 80 mg by mouth daily.  Marland Kitchen glucose blood (ACCU-CHEK SMARTVIEW) test strip Dx: 250.00  . losartan (COZAAR) 100 MG tablet Take 1 tablet (100 mg total) by mouth daily.  . metFORMIN (GLUCOPHAGE) 500 MG tablet TAKE 2 TABLETS BY MOUTH EVERY MORNING AND 2 TABLETS BY MOUTH EVERY EVENING  . Multiple Vitamin (MULTIVITAMIN) capsule Take 1 capsule by mouth daily.   . Omega-3 Fatty Acids (FISH OIL) 1200 MG CAPS Take 1,200 mg by mouth 2 (two) times daily.   . ondansetron (ZOFRAN ODT) 4 MG disintegrating tablet Take 1 tablet (4 mg total) by mouth every 8 (eight) hours as needed for nausea.  . polyethylene glycol powder (GLYCOLAX/MIRALAX) powder Take 17 g by mouth daily. One capfull daily by mouth with liquid  . potassium chloride (KLOR-CON 10) 10 MEQ tablet Take 1 tablet (10 mEq total) by mouth daily.  . simvastatin (ZOCOR) 40 MG tablet TAKE 1 TABLET(40 MG) BY MOUTH DAILY  . sodium chloride (OCEAN) 0.65 % nasal spray Place 1-2 sprays into the nose as needed. Allergies   . [DISCONTINUED] simvastatin (ZOCOR) 40 MG tablet TAKE 1 TABLET(40 MG) BY MOUTH DAILY   No facility-administered encounter medications on file as of 01/09/2017.     Activities of Daily Living In your present state of health, do you have any difficulty performing the following activities: 01/09/2017  Hearing? N  Vision? N  Difficulty concentrating or making decisions? N  Walking or climbing stairs? Y  Dressing or bathing? N  Doing errands, shopping? N  Preparing Food and eating ? N  Using the Toilet? N  In the past six months, have you accidently leaked urine? N  Do you have problems with loss of bowel control? N  Managing your Medications? N  Managing your Finances? N  Housekeeping or managing your Housekeeping? N  Some recent data might be hidden    Patient Care Team: [name], MD as PCP - General (Family  Medicine) Franchot Gallo, MD as Consulting Physician (Urology) Jari Pigg, MD as Consulting Physician (Dermatology) Constance Haw, MD as Consulting Physician (Cardiology)    Assessment:    Physical assessment deferred to PCP.  Exercise Activities and Dietary recommendations Current Exercise Habits: The patient does not participate in regular exercise at present, Exercise limited by: Other - see comments   Diet (meal preparation, eat out, water intake, caffeinated beverages, dairy products, fruits and vegetables): Drinks coffee, tea and water  Breakfast: fruit, muffin; cereal; eggs/fruit Lunch: sandwich, soup, salad, sub Dinner:  protein, vegetables, fruit   Discussed heart healthy diet.  Chair/bed exercises emailed to patient at [email or web address] using EMMI tool at pts request.   Goals    . patient          Continue with weight control measures Maintain labs with med and eating well;      . Weight (lb) < 185 lb (83.9 kg)          Lose weight by changing diet.       Fall Risk Fall Risk  01/09/2017 09/03/2016 01/04/2016 06/12/2014  Falls in the past year? No No No No   Depression Screen PHQ 2/9 Scores 01/09/2017 09/03/2016 01/04/2016 06/12/2014  PHQ - 2 Score 0 0 0 0     Cognitive Function MMSE - Mini Mental State Exam 01/04/2016  Not completed: (No Data)       Ad8 score reviewed for issues:  Issues making decisions: no  Less interest in hobbies / activities: no  Repeats questions, stories (family complaining): no  Trouble using ordinary gadgets (microwave, computer, phone): no  Forgets the month or year: no  Mismanaging finances: no  Remembering appts: no  Daily problems with thinking and/or memory: no Ad8 score is=0     Immunization History  Administered Date(s) Administered  . Influenza Split 05/04/2011  . Influenza Whole 04/16/2010  . Influenza, High Dose Seasonal PF 04/15/2013, 05/14/2015  . Influenza, Seasonal, Injecte,  Preservative Fre 06/01/2012  . Influenza,inj,Quad PF,36+ Mos 02/09/2014  . Influenza-Unspecified 02/29/2016  . PPD Test 01/07/2011, 01/07/2011, 01/07/2011, 01/07/2011, 01/07/2011  . Pneumococcal Conjugate-13 06/12/2014  . Pneumococcal Polysaccharide-23 06/16/2004, 01/07/2011  . Td 06/16/2005  . Tdap 06/03/2011, 09/27/2016   Shingrix Rx sent to pharmacy.  Screening Tests Health Maintenance  Topic Date Due  . FOOT EXAM  12/20/2015  . OPHTHALMOLOGY EXAM  07/24/2016  . INFLUENZA VACCINE  01/14/2017  . HEMOGLOBIN A1C  04/26/2017  . MAMMOGRAM  09/11/2017  . COLONOSCOPY  12/06/2024  . TETANUS/TDAP  09/28/2026  . DEXA SCAN  Completed  . Hepatitis C Screening  Completed  . PNA vac Low Risk Adult  Completed      Plan:    Bring a copy of your advance directives to your next office visit.  Continue doing brain stimulating activities (puzzles, reading, adult coloring books, staying active) to keep memory sharp.   Shingles Vaccine at pharmacy.   Make appointment for mammogram.   I have personally reviewed and noted the following in the patient's chart:   . Medical and social history . Use of alcohol, tobacco or illicit drugs  . Current medications and supplements . Functional ability and status . Nutritional status . Physical activity . Advanced directives . List of other physicians . Hospitalizations, surgeries, and ER visits in previous 12 months . Vitals . Screenings to include cognitive, depression, and falls . Referrals and appointments  In addition, I have reviewed and discussed with patient certain preventive protocols, quality metrics, and best practice recommendations. A written personalized care plan for preventive services as well as general preventive health recommendations were provided to patient.     Gerilyn Nestle, RN  01/09/2017  Agree with assessment and recommendations as above.  [name] MD [facility] at [facility]

## 2017-01-09 ENCOUNTER — Encounter: Payer: Self-pay | Admitting: Family Medicine

## 2017-01-09 ENCOUNTER — Ambulatory Visit (INDEPENDENT_AMBULATORY_CARE_PROVIDER_SITE_OTHER): Payer: Medicare Other | Admitting: Family Medicine

## 2017-01-09 VITALS — BP 120/60 | HR 88 | Temp 98.6°F

## 2017-01-09 DIAGNOSIS — I10 Essential (primary) hypertension: Secondary | ICD-10-CM | POA: Diagnosis not present

## 2017-01-09 DIAGNOSIS — E119 Type 2 diabetes mellitus without complications: Secondary | ICD-10-CM | POA: Diagnosis not present

## 2017-01-09 DIAGNOSIS — E785 Hyperlipidemia, unspecified: Secondary | ICD-10-CM

## 2017-01-09 DIAGNOSIS — Z23 Encounter for immunization: Secondary | ICD-10-CM

## 2017-01-09 DIAGNOSIS — Z0000 Encounter for general adult medical examination without abnormal findings: Secondary | ICD-10-CM

## 2017-01-09 LAB — BASIC METABOLIC PANEL
BUN: 37 mg/dL — ABNORMAL HIGH (ref 6–23)
CALCIUM: 9.5 mg/dL (ref 8.4–10.5)
CO2: 21 meq/L (ref 19–32)
Chloride: 105 mEq/L (ref 96–112)
Creatinine, Ser: 1.17 mg/dL (ref 0.40–1.20)
GFR: 48.24 mL/min — AB (ref >60.00)
GLUCOSE: 225 mg/dL — AB (ref 70–99)
POTASSIUM: 4.4 meq/L (ref 3.5–5.1)
SODIUM: 136 meq/L (ref 135–145)

## 2017-01-09 LAB — LIPID PANEL
Cholesterol: 134 mg/dL (ref 0–200)
HDL: 36.3 mg/dL — AB (ref >39.00)
NONHDL: 97.55
TRIGLYCERIDES: 278 mg/dL — AB (ref 0.0–149.0)
Total CHOL/HDL Ratio: 4
VLDL: 55.6 mg/dL — AB (ref 0.0–40.0)

## 2017-01-09 LAB — HEPATIC FUNCTION PANEL
ALT: 25 U/L (ref 0–35)
AST: 17 U/L (ref 0–37)
Albumin: 3.9 g/dL (ref 3.5–5.2)
Alkaline Phosphatase: 79 U/L (ref 39–117)
BILIRUBIN TOTAL: 0.3 mg/dL (ref 0.2–1.2)
Bilirubin, Direct: 0.1 mg/dL (ref 0.0–0.3)
Total Protein: 6.7 g/dL (ref 6.0–8.3)

## 2017-01-09 LAB — LDL CHOLESTEROL, DIRECT: LDL DIRECT: 75 mg/dL

## 2017-01-09 MED ORDER — ZOSTER VAC RECOMB ADJUVANTED 50 MCG/0.5ML IM SUSR: 0.5000 mL | Freq: Once | INTRAMUSCULAR | 1 refills | Status: AC

## 2017-01-09 NOTE — Progress Notes (Signed)
Subjective:     Patient ID: Jordan [name], female   DOB: [birth year], 71 y.o.   MRN: [id number]  HPI Patient here today for medical follow-up- and Medicare Wellness exam with our health coach. She has history of morbid obesity, type 2 diabetes, cerebral palsy, hypertension, kidney stones. She's had recent complicated wound of the leg following laceration and this was couple months ago. She is followed at wound care center now. She is getting regular debridement along with compression wraps and her wound is slowly healing. She has been frustrated with the length of time for recovery. Last A1c was 6.9% 2 months ago.  Medications reviewed.  Compliant with all. 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 She has some chronic edema and wearing support hose daily  Past Medical History:  Diagnosis Date  . Arthritis    OA  . Blood transfusion 1992   AFTER  SURGERY TO REMOVE BLADDER  . 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 COLONIC POLYPS 07/11/2010  . Diabetes mellitus    ORAL MED FOR DIABETES  . GERD (gastroesophageal reflux disease)    PAST HISTORY GERD--WATCHES DIET - NOT ON ANY MEDS FOR GERD  . HYPERLIPIDEMIA 11/29/2009  . HYPERTENSION 11/29/2009  . Incisional hernia    NO PAIN-NOT CAUSING ANY DISCOMFORT  . PONV (postoperative nausea and vomiting)   . Presence of urostomy [facility])    11-28-14 remains   Past Surgical History:  Procedure Laterality Date  . ABDOMINAL HYSTERECTOMY  1990   partial  . APPENDECTOMY    . CHOLECYSTECTOMY    . COLONOSCOPY WITH PROPOFOL N/A 12/07/2014   Procedure: COLONOSCOPY WITH PROPOFOL;  Surgeon: Gatha Mayer, MD;  Location[facility];  Service: Endoscopy;  Laterality: N/A;  . LEFT CARPAL TUNNEL RELEASE X 2    . LUMBAR SYMPATHETECTOMIES X 2    . NEPHROLITHOTOMY  10/01/2011   Procedure: NEPHROLITHOTOMY PERCUTANEOUS;   Surgeon: Franchot Gallo, MD;  Location: [facility];  Service: Urology;  Laterality: Right;       . radical cystectomy  1992   neurogenic bladder sec CP  . REVISION UROSTOMY CUTANEOUS  1992  . RIGHT CARPAL TUNNEL RELEASE    . RT ANKLE FUSION  1975   SEVERAL RT ANKLE SURGERIES PRIOR TO THE FUSION  . 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 TONSILLECTOMY      reports that she has never smoked. She has never used smokeless tobacco. She reports that she does not drink alcohol or use drugs. family history includes Bladder Cancer in her father; Colon cancer (age of onset: 62) in her father; Colon polyps in her sister; Emphysema in her father; Heart attack in her maternal grandfather; Heart disease in her father and maternal grandmother; Heart disease (age of onset: 73) in her mother. Allergies  Allergen Reactions  . Morphine Shortness Of Breath  . Baclofen     GI upset  . Lisinopril Cough  . Metoclopramide Hcl     Reglan, irritable  . Penicillins     " MAJOR CASE OF HIVES"     Review of Systems  Constitutional: Negative for fatigue.  Eyes: Negative for visual disturbance.  Respiratory: Negative for cough, chest tightness, shortness of breath and wheezing.   Cardiovascular: Positive for leg swelling. Negative for chest pain and palpitations.  Neurological: Negative  for dizziness, seizures, syncope, weakness, light-headedness and headaches.       Objective:   Physical Exam  Constitutional: She appears well-developed and well-nourished.  Eyes: Pupils are equal, round, and reactive to light.  Neck: Neck supple. No JVD present. No thyromegaly present.  Cardiovascular: Normal rate and regular rhythm.  Exam reveals no gallop.   Pulmonary/Chest: Effort normal and breath sounds normal. No respiratory distress. She has no wheezes. She has no rales.  Musculoskeletal:  She has compression hose on the left leg and compression wrap on the right. These were not removed today   Neurological: She is alert.       Assessment:     #1 hypertension stable and at goal  #2 dyslipidemia  #3 type 2 diabetes fairly well controlled    Plan:     -Check further labs with basic metabolic panel, lipid panel, hepatic panel -A1c at follow-up in 3 months -Continue close follow-up with wound care center  [name] MD [facility] at [facility]

## 2017-01-09 NOTE — Patient Instructions (Addendum)
Jordan [name] , Thank you for taking time to come for your Medicare Wellness Visit. I appreciate your ongoing commitment to your health goals. Please review the following plan we discussed and let me know if I can assist you in the future.   These are the goals we discussed: Goals    . patient          Continue with weight control measures Maintain labs with med and eating well;      . Weight (lb) < 185 lb (83.9 kg)          Lose weight by changing diet.        This is a list of the screening recommended for you and due dates:  Health Maintenance  Topic Date Due  . Complete foot exam   12/20/2015  . Flu Shot  01/14/2017  . Hemoglobin A1C  04/26/2017  . Eye exam for diabetics  06/16/2017  . Mammogram  09/11/2017  . Colon Cancer Screening  12/06/2024  . Tetanus Vaccine  09/28/2026  . DEXA scan (bone density measurement)  Completed  .  Hepatitis C: One time screening is recommended by Center for Disease Control  (CDC) for  adults born from [birth year] through 1967.   Completed  . Pneumonia vaccines  Completed   Bring a copy of your advance directives to your next office visit.  Continue doing brain stimulating activities (puzzles, reading, adult coloring books, staying active) to keep memory sharp.   Shingles Vaccine at pharmacy.   Make appointment for mammogram.   Health Maintenance, Female Adopting a healthy lifestyle and getting preventive care can go a long way to promote health and wellness. Talk with your health care provider about what schedule of regular examinations is right for you. This is a good chance for you to check in with your provider about disease prevention and staying healthy. In between checkups, there are plenty of things you can do on your own. Experts have done a lot of research about which lifestyle changes and preventive measures are most likely to keep you healthy. Ask your health care provider for more information. Weight and diet Eat a healthy diet  Be  sure to include plenty of vegetables, fruits, low-fat dairy products, and lean protein.  Do not eat a lot of foods high in solid fats, added sugars, or salt.  Get regular exercise. This is one of the most important things you can do for your health. ? Most adults should exercise for at least 150 minutes each week. The exercise should increase your heart rate and make you sweat (moderate-intensity exercise). ? Most adults should also do strengthening exercises at least twice a week. This is in addition to the moderate-intensity exercise.  Maintain a healthy weight  Body mass index (BMI) is a measurement that can be used to identify possible weight problems. It estimates body fat based on height and weight. Your health care provider can help determine your BMI and help you achieve or maintain a healthy weight.  For females 40 years of age and older: ? A BMI below 18.5 is considered underweight. ? A BMI of 18.5 to 24.9 is normal. ? A BMI of 25 to 29.9 is considered overweight. ? A BMI of 30 and above is considered obese.  Watch levels of cholesterol and blood lipids  You should start having your blood tested for lipids and cholesterol at 73 years of age, then have this test every 5 years.  You may need to  have your cholesterol levels checked more often if: ? Your lipid or cholesterol levels are high. ? You are older than 73 years of age. ? You are at high risk for heart disease.  Cancer screening Lung Cancer  Lung cancer screening is recommended for adults 61-56 years old who are at high risk for lung cancer because of a history of smoking.  A yearly low-dose CT scan of the lungs is recommended for people who: ? Currently smoke. ? Have quit within the past 15 years. ? Have at least a 30-pack-year history of smoking. A pack year is smoking an average of one pack of cigarettes a day for 1 year.  Yearly screening should continue until it has been 15 years since you quit.  Yearly  screening should stop if you develop a health problem that would prevent you from having lung cancer treatment.  Breast Cancer  Practice breast self-awareness. This means understanding how your breasts normally appear and feel.  It also means doing regular breast self-exams. Let your health care provider know about any changes, no matter how small.  If you are in your 20s or 30s, you should have a clinical breast exam (CBE) by a health care provider every 1-3 years as part of a regular health exam.  If you are 72 or older, have a CBE every year. Also consider having a breast X-ray (mammogram) every year.  If you have a family history of breast cancer, talk to your health care provider about genetic screening.  If you are at high risk for breast cancer, talk to your health care provider about having an MRI and a mammogram every year.  Breast cancer gene (BRCA) assessment is recommended for women who have family members with BRCA-related cancers. BRCA-related cancers include: ? Breast. ? Ovarian. ? Tubal. ? Peritoneal cancers.  Results of the assessment will determine the need for genetic counseling and BRCA1 and BRCA2 testing.  Cervical Cancer Your health care provider may recommend that you be screened regularly for cancer of the pelvic organs (ovaries, uterus, and vagina). This screening involves a pelvic examination, including checking for microscopic changes to the surface of your cervix (Pap test). You may be encouraged to have this screening done every 3 years, beginning at age 58.  For women ages 62-65, health care providers may recommend pelvic exams and Pap testing every 3 years, or they may recommend the Pap and pelvic exam, combined with testing for human papilloma virus (HPV), every 5 years. Some types of HPV increase your risk of cervical cancer. Testing for HPV may also be done on women of any age with unclear Pap test results.  Other health care providers may not recommend  any screening for nonpregnant women who are considered low risk for pelvic cancer and who do not have symptoms. Ask your health care provider if a screening pelvic exam is right for you.  If you have had past treatment for cervical cancer or a condition that could lead to cancer, you need Pap tests and screening for cancer for at least 20 years after your treatment. If Pap tests have been discontinued, your risk factors (such as having a new sexual partner) need to be reassessed to determine if screening should resume. Some women have medical problems that increase the chance of getting cervical cancer. In these cases, your health care provider may recommend more frequent screening and Pap tests.  Colorectal Cancer  This type of cancer can be detected and often prevented.  Routine colorectal cancer screening usually begins at 73 years of age and continues through 73 years of age.  Your health care provider may recommend screening at an earlier age if you have risk factors for colon cancer.  Your health care provider may also recommend using home test kits to check for hidden blood in the stool.  A small camera at the end of a tube can be used to examine your colon directly (sigmoidoscopy or colonoscopy). This is done to check for the earliest forms of colorectal cancer.  Routine screening usually begins at age 30.  Direct examination of the colon should be repeated every 5-10 years through 73 years of age. However, you may need to be screened more often if early forms of precancerous polyps or small growths are found.  Skin Cancer  Check your skin from head to toe regularly.  Tell your health care provider about any new moles or changes in moles, especially if there is a change in a mole's shape or color.  Also tell your health care provider if you have a mole that is larger than the size of a pencil eraser.  Always use sunscreen. Apply sunscreen liberally and repeatedly throughout the  day.  Protect yourself by wearing long sleeves, pants, a wide-brimmed hat, and sunglasses whenever you are outside.  Heart disease, diabetes, and high blood pressure  High blood pressure causes heart disease and increases the risk of stroke. High blood pressure is more likely to develop in: ? People who have blood pressure in the high end of the normal range (130-139/85-89 mm Hg). ? People who are overweight or obese. ? People who are African American.  If you are 65-88 years of age, have your blood pressure checked every 3-5 years. If you are 1 years of age or older, have your blood pressure checked every year. You should have your blood pressure measured twice-once when you are at a hospital or clinic, and once when you are not at a hospital or clinic. Record the average of the two measurements. To check your blood pressure when you are not at a hospital or clinic, you can use: ? An automated blood pressure machine at a pharmacy. ? A home blood pressure monitor.  If you are between 47 years and 45 years old, ask your health care provider if you should take aspirin to prevent strokes.  Have regular diabetes screenings. This involves taking a blood sample to check your fasting blood sugar level. ? If you are at a normal weight and have a low risk for diabetes, have this test once every three years after 73 years of age. ? If you are overweight and have a high risk for diabetes, consider being tested at a younger age or more often. Preventing infection Hepatitis B  If you have a higher risk for hepatitis B, you should be screened for this virus. You are considered at high risk for hepatitis B if: ? You were born in a country where hepatitis B is common. Ask your health care provider which countries are considered high risk. ? Your parents were born in a high-risk country, and you have not been immunized against hepatitis B (hepatitis B vaccine). ? You have HIV or AIDS. ? You use needles to  inject street drugs. ? You live with someone who has hepatitis B. ? You have had sex with someone who has hepatitis B. ? You get hemodialysis treatment. ? You take certain medicines for conditions, including cancer, organ  transplantation, and autoimmune conditions.  Hepatitis C  Blood testing is recommended for: ? Everyone born from [birth year] through 1967. ? Anyone with known risk factors for hepatitis C.  Sexually transmitted infections (STIs)  You should be screened for sexually transmitted infections (STIs) including gonorrhea and chlamydia if: ? You are sexually active and are younger than 73 years of age. ? You are older than 73 years of age and your health care provider tells you that you are at risk for this type of infection. ? Your sexual activity has changed since you were last screened and you are at an increased risk for chlamydia or gonorrhea. Ask your health care provider if you are at risk.  If you do not have HIV, but are at risk, it may be recommended that you take a prescription medicine daily to prevent HIV infection. This is called pre-exposure prophylaxis (PrEP). You are considered at risk if: ? You are sexually active and do not regularly use condoms or know the HIV status of your partner(s). ? You take drugs by injection. ? You are sexually active with a partner who has HIV.  Talk with your health care provider about whether you are at high risk of being infected with HIV. If you choose to begin PrEP, you should first be tested for HIV. You should then be tested every 3 months for as long as you are taking PrEP. Pregnancy  If you are premenopausal and you may become pregnant, ask your health care provider about preconception counseling.  If you may become pregnant, take 400 to 800 micrograms (mcg) of folic acid every day.  If you want to prevent pregnancy, talk to your health care provider about birth control (contraception). Osteoporosis and  menopause  Osteoporosis is a disease in which the bones lose minerals and strength with aging. This can result in serious bone fractures. Your risk for osteoporosis can be identified using a bone density scan.  If you are 57 years of age or older, or if you are at risk for osteoporosis and fractures, ask your health care provider if you should be screened.  Ask your health care provider whether you should take a calcium or vitamin D supplement to lower your risk for osteoporosis.  Menopause may have certain physical symptoms and risks.  Hormone replacement therapy may reduce some of these symptoms and risks. Talk to your health care provider about whether hormone replacement therapy is right for you. Follow these instructions at home:  Schedule regular health, dental, and eye exams.  Stay current with your immunizations.  Do not use any tobacco products including cigarettes, chewing tobacco, or electronic cigarettes.  If you are pregnant, do not drink alcohol.  If you are breastfeeding, limit how much and how often you drink alcohol.  Limit alcohol intake to no more than 1 drink per day for nonpregnant women. One drink equals 12 ounces of beer, 5 ounces of wine, or 1 ounces of hard liquor.  Do not use street drugs.  Do not share needles.  Ask your health care provider for help if you need support or information about quitting drugs.  Tell your health care provider if you often feel depressed.  Tell your health care provider if you have ever been abused or do not feel safe at home. This information is not intended to replace advice given to you by your health care provider. Make sure you discuss any questions you have with your health care provider. Document Released: 12/16/2010 Document  Revised: 11/08/2015 Document Reviewed: 03/06/2015 Elsevier Interactive Patient Education  [employer].

## 2017-01-11 ENCOUNTER — Encounter: Payer: Self-pay | Admitting: Family Medicine

## 2017-01-13 DIAGNOSIS — E11622 Type 2 diabetes mellitus with other skin ulcer: Secondary | ICD-10-CM | POA: Diagnosis not present

## 2017-01-13 DIAGNOSIS — G809 Cerebral palsy, unspecified: Secondary | ICD-10-CM | POA: Diagnosis not present

## 2017-01-13 DIAGNOSIS — I87311 Chronic venous hypertension (idiopathic) with ulcer of right lower extremity: Secondary | ICD-10-CM | POA: Diagnosis not present

## 2017-01-13 DIAGNOSIS — Z981 Arthrodesis status: Secondary | ICD-10-CM | POA: Diagnosis not present

## 2017-01-13 DIAGNOSIS — L97812 Non-pressure chronic ulcer of other part of right lower leg with fat layer exposed: Secondary | ICD-10-CM | POA: Diagnosis not present

## 2017-01-13 DIAGNOSIS — Z7984 Long term (current) use of oral hypoglycemic drugs: Secondary | ICD-10-CM | POA: Diagnosis not present

## 2017-01-13 DIAGNOSIS — E119 Type 2 diabetes mellitus without complications: Secondary | ICD-10-CM | POA: Diagnosis not present

## 2017-01-13 DIAGNOSIS — I890 Lymphedema, not elsewhere classified: Secondary | ICD-10-CM | POA: Diagnosis not present

## 2017-01-20 ENCOUNTER — Encounter (HOSPITAL_BASED_OUTPATIENT_CLINIC_OR_DEPARTMENT_OTHER): Payer: Medicare Other | Attending: Surgery

## 2017-01-20 DIAGNOSIS — Z7984 Long term (current) use of oral hypoglycemic drugs: Secondary | ICD-10-CM | POA: Diagnosis not present

## 2017-01-20 DIAGNOSIS — L97812 Non-pressure chronic ulcer of other part of right lower leg with fat layer exposed: Secondary | ICD-10-CM | POA: Diagnosis not present

## 2017-01-20 DIAGNOSIS — Z981 Arthrodesis status: Secondary | ICD-10-CM | POA: Insufficient documentation

## 2017-01-20 DIAGNOSIS — I890 Lymphedema, not elsewhere classified: Secondary | ICD-10-CM | POA: Diagnosis not present

## 2017-01-20 DIAGNOSIS — E11622 Type 2 diabetes mellitus with other skin ulcer: Secondary | ICD-10-CM | POA: Diagnosis not present

## 2017-01-20 DIAGNOSIS — E119 Type 2 diabetes mellitus without complications: Secondary | ICD-10-CM | POA: Insufficient documentation

## 2017-01-20 DIAGNOSIS — I87311 Chronic venous hypertension (idiopathic) with ulcer of right lower extremity: Secondary | ICD-10-CM | POA: Insufficient documentation

## 2017-01-27 DIAGNOSIS — E119 Type 2 diabetes mellitus without complications: Secondary | ICD-10-CM | POA: Diagnosis not present

## 2017-01-27 DIAGNOSIS — L97812 Non-pressure chronic ulcer of other part of right lower leg with fat layer exposed: Secondary | ICD-10-CM | POA: Diagnosis not present

## 2017-01-27 DIAGNOSIS — Z981 Arthrodesis status: Secondary | ICD-10-CM | POA: Diagnosis not present

## 2017-01-27 DIAGNOSIS — Z7984 Long term (current) use of oral hypoglycemic drugs: Secondary | ICD-10-CM | POA: Diagnosis not present

## 2017-01-27 DIAGNOSIS — I890 Lymphedema, not elsewhere classified: Secondary | ICD-10-CM | POA: Diagnosis not present

## 2017-01-27 DIAGNOSIS — E11622 Type 2 diabetes mellitus with other skin ulcer: Secondary | ICD-10-CM | POA: Diagnosis not present

## 2017-01-27 DIAGNOSIS — I87311 Chronic venous hypertension (idiopathic) with ulcer of right lower extremity: Secondary | ICD-10-CM | POA: Diagnosis not present

## 2017-02-03 DIAGNOSIS — I87311 Chronic venous hypertension (idiopathic) with ulcer of right lower extremity: Secondary | ICD-10-CM | POA: Diagnosis not present

## 2017-02-03 DIAGNOSIS — Z981 Arthrodesis status: Secondary | ICD-10-CM | POA: Diagnosis not present

## 2017-02-03 DIAGNOSIS — S81811A Laceration without foreign body, right lower leg, initial encounter: Secondary | ICD-10-CM | POA: Diagnosis not present

## 2017-02-03 DIAGNOSIS — Z7984 Long term (current) use of oral hypoglycemic drugs: Secondary | ICD-10-CM | POA: Diagnosis not present

## 2017-02-03 DIAGNOSIS — L97812 Non-pressure chronic ulcer of other part of right lower leg with fat layer exposed: Secondary | ICD-10-CM | POA: Diagnosis not present

## 2017-02-03 DIAGNOSIS — E119 Type 2 diabetes mellitus without complications: Secondary | ICD-10-CM | POA: Diagnosis not present

## 2017-02-03 DIAGNOSIS — I890 Lymphedema, not elsewhere classified: Secondary | ICD-10-CM | POA: Diagnosis not present

## 2017-02-10 DIAGNOSIS — E119 Type 2 diabetes mellitus without complications: Secondary | ICD-10-CM | POA: Diagnosis not present

## 2017-02-10 DIAGNOSIS — Z7984 Long term (current) use of oral hypoglycemic drugs: Secondary | ICD-10-CM | POA: Diagnosis not present

## 2017-02-10 DIAGNOSIS — S81811D Laceration without foreign body, right lower leg, subsequent encounter: Secondary | ICD-10-CM | POA: Diagnosis not present

## 2017-02-10 DIAGNOSIS — I87311 Chronic venous hypertension (idiopathic) with ulcer of right lower extremity: Secondary | ICD-10-CM | POA: Diagnosis not present

## 2017-02-10 DIAGNOSIS — L97812 Non-pressure chronic ulcer of other part of right lower leg with fat layer exposed: Secondary | ICD-10-CM | POA: Diagnosis not present

## 2017-02-10 DIAGNOSIS — Z981 Arthrodesis status: Secondary | ICD-10-CM | POA: Diagnosis not present

## 2017-02-10 DIAGNOSIS — I890 Lymphedema, not elsewhere classified: Secondary | ICD-10-CM | POA: Diagnosis not present

## 2017-02-20 DIAGNOSIS — N200 Calculus of kidney: Secondary | ICD-10-CM | POA: Diagnosis not present

## 2017-02-20 DIAGNOSIS — N319 Neuromuscular dysfunction of bladder, unspecified: Secondary | ICD-10-CM | POA: Diagnosis not present

## 2017-03-05 ENCOUNTER — Encounter: Payer: Self-pay | Admitting: Family Medicine

## 2017-03-23 DIAGNOSIS — H26493 Other secondary cataract, bilateral: Secondary | ICD-10-CM | POA: Diagnosis not present

## 2017-03-23 DIAGNOSIS — E119 Type 2 diabetes mellitus without complications: Secondary | ICD-10-CM | POA: Diagnosis not present

## 2017-03-23 DIAGNOSIS — H35033 Hypertensive retinopathy, bilateral: Secondary | ICD-10-CM | POA: Diagnosis not present

## 2017-03-23 DIAGNOSIS — H40013 Open angle with borderline findings, low risk, bilateral: Secondary | ICD-10-CM | POA: Diagnosis not present

## 2017-03-23 DIAGNOSIS — H26491 Other secondary cataract, right eye: Secondary | ICD-10-CM | POA: Diagnosis not present

## 2017-03-23 LAB — HM DIABETES EYE EXAM

## 2017-03-24 ENCOUNTER — Telehealth: Payer: Self-pay | Admitting: *Deleted

## 2017-03-24 NOTE — Telephone Encounter (Signed)
Andria Rhein from [employer] dropped off forms that needs to be completed for the patient with instructions. Debbie request for this to be faxed to [phone]. Debbie's cell-- [phone].  Forms placed in Dr Erick Blinks folder

## 2017-03-28 ENCOUNTER — Other Ambulatory Visit: Payer: Self-pay | Admitting: Family Medicine

## 2017-04-02 ENCOUNTER — Encounter: Payer: Self-pay | Admitting: Adult Health

## 2017-04-02 ENCOUNTER — Ambulatory Visit (INDEPENDENT_AMBULATORY_CARE_PROVIDER_SITE_OTHER): Payer: Medicare Other | Admitting: Adult Health

## 2017-04-02 VITALS — BP 140/70 | HR 81 | Temp 98.8°F

## 2017-04-02 DIAGNOSIS — N12 Tubulo-interstitial nephritis, not specified as acute or chronic: Secondary | ICD-10-CM

## 2017-04-02 LAB — POCT URINALYSIS DIPSTICK
BILIRUBIN UA: NEGATIVE
Blood, UA: 2
GLUCOSE UA: NEGATIVE
Ketones, UA: NEGATIVE
NITRITE UA: NEGATIVE
PH UA: 8.5 — AB (ref 5.0–8.0)
Protein, UA: 1
Spec Grav, UA: 1.01 (ref 1.010–1.025)
UROBILINOGEN UA: 0.2 U/dL

## 2017-04-02 MED ORDER — CIPROFLOXACIN HCL 500 MG PO TABS: 500.0000 mg | ORAL_TABLET | Freq: Two times a day (BID) | ORAL | 0 refills | Status: DC

## 2017-04-02 NOTE — Patient Instructions (Signed)
It was great meeting you today!  I have sent in a prescription for Cipro to help get rid of your kidney infection   I am going to send a urine culture and will follow up with you when that comes back

## 2017-04-02 NOTE — Progress Notes (Signed)
Subjective:    Patient ID: Jordan [name], female    DOB: Oct 03, 1945, 71 y.o.   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  She is a patient of Dr. Elease Hashimoto, who I am seeing today for the acute complaint of possible pyelonephritis reports that her symptoms have been present for the last 24 hours. Her symptoms include that right flank/back pain, hematuria, and foul odor.   She has had a urostomy since 1992   She denies any nausea, vomiting, or fevers   Review of Systems See HPI   Past Medical History:  Diagnosis Date  . Arthritis    OA  . Blood transfusion 1992   AFTER  SURGERY TO REMOVE BLADDER  . 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 COLONIC POLYPS 07/11/2010  . Diabetes mellitus    ORAL MED FOR DIABETES  . GERD (gastroesophageal reflux disease)    PAST HISTORY GERD--WATCHES DIET - NOT ON ANY MEDS FOR GERD  . HYPERLIPIDEMIA 11/29/2009  . HYPERTENSION 11/29/2009  . Incisional hernia    NO PAIN-NOT CAUSING ANY DISCOMFORT  . PONV (postoperative nausea and vomiting)   . Presence of urostomy [facility])    11-28-14 remains    Social History   Social History  . Marital status: Married    Spouse name: N/A  . Number of children: 1  . Years of education: N/A   Occupational History  . Counselor; [employer]    Social History Main Topics  . Smoking status: Never Smoker  . Smokeless tobacco: Never Used  . Alcohol use No  . Drug use: No  . Sexual activity: Not on file   Other Topics Concern  . 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 ABDOMINAL HYSTERECTOMY  1990   partial  . APPENDECTOMY    . CHOLECYSTECTOMY    . COLONOSCOPY WITH PROPOFOL N/A 12/07/2014   Procedure: COLONOSCOPY WITH PROPOFOL;  Surgeon: Gatha Mayer, MD;  Location: [facility];  Service: Endoscopy;  Laterality: N/A;  . LEFT CARPAL TUNNEL RELEASE X 2    . LUMBAR SYMPATHETECTOMIES X 2    . NEPHROLITHOTOMY  10/01/2011   Procedure: NEPHROLITHOTOMY PERCUTANEOUS;  Surgeon: Franchot Gallo, MD;  Location: [facility];  Service: Urology;  Laterality: Right;       . radical cystectomy  1992   neurogenic bladder sec CP  . REVISION UROSTOMY CUTANEOUS  1992  . RIGHT CARPAL TUNNEL RELEASE    . RT ANKLE FUSION  1975   SEVERAL RT ANKLE SURGERIES PRIOR TO THE FUSION  . 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 TONSILLECTOMY      Family History  Problem Relation Age of Onset  . Heart disease Mother 80       COPD  . Heart disease Father        COPD   . Emphysema Father   . Colon cancer Father 69       Died at  80  . Bladder Cancer Father   . Colon polyps Sister   . Heart disease Maternal Grandmother   . Heart attack Maternal Grandfather   . Esophageal cancer Neg Hx   . Kidney disease Neg Hx     Allergies  Allergen Reactions  . Morphine Shortness Of Breath  . Baclofen     GI upset  . Lisinopril Cough  . Metoclopramide Hcl     Reglan, irritable  . Penicillins     " MAJOR CASE OF HIVES"    Current Outpatient Prescriptions on File Prior to Visit  Medication Sig Dispense Refill  . ACCU-CHEK FASTCLIX LANCETS MISC Use to check blood sugar once a day 100 each 1  . acetaminophen (TYLENOL) 500 MG tablet Take 500 mg by mouth every 6 (six) hours as needed for mild pain. Pain     . aspirin 81 MG tablet Take 81 mg by mouth daily.     Marland Kitchen b complex vitamins capsule Take 1 capsule by mouth daily.     .  cetirizine (ZYRTEC) 10 MG tablet Take 1 tablet (10 mg total) by mouth daily as needed. allergies 90 tablet 1  . Cholecalciferol (VITAMIN D) 2000 UNITS CAPS Take 2,000 Units by mouth See admin instructions. Takes only on days Monday through thursday    . furosemide (LASIX) 80 MG tablet TAKE 1 TABLET(80 MG) BY MOUTH TWICE DAILY (Patient taking differently: TAKE 1 TABLET(80 MG) BY MOUTH  DAILY) 180 tablet 1  . glucose blood (ACCU-CHEK SMARTVIEW) test strip Dx: 250.00 100 each 5  . losartan (COZAAR) 100 MG tablet Take 1 tablet (100 mg total) by mouth daily. 90 tablet 1  . metFORMIN (GLUCOPHAGE) 500 MG tablet TAKE 2 TABLETS BY MOUTH EVERY MORNING AND 2 TABLETS BY MOUTH EVERY EVENING 360 tablet 1  . Multiple Vitamin (MULTIVITAMIN) capsule Take 1 capsule by mouth daily.     . Omega-3 Fatty Acids (FISH OIL) 1200 MG CAPS Take 1,200 mg by mouth 2 (two) times daily.     . ondansetron (ZOFRAN ODT) 4 MG disintegrating tablet Take 1 tablet (4 mg total) by mouth every 8 (eight) hours as needed for nausea. 6 tablet 0  . polyethylene glycol powder (GLYCOLAX/MIRALAX) powder Take 17 g by mouth daily. One capfull daily by mouth with liquid    . potassium chloride (KLOR-CON 10) 10 MEQ tablet Take 1 tablet (10 mEq total) by mouth daily. 30 tablet 5  . simvastatin (ZOCOR) 40 MG tablet TAKE 1 TABLET(40 MG) BY MOUTH DAILY 90 tablet 1  . sodium chloride (OCEAN) 0.65 % nasal spray Place 1-2 sprays into the nose as needed. Allergies      No current facility-administered medications on file prior to visit.     BP 140/70 (BP Location: Left Arm)   Pulse 81   Temp 98.8 F (37.1 C) (Oral)   SpO2 97%       Objective:   Physical Exam  Constitutional: She is oriented to person, place, and time. She appears well-developed and well-nourished. No distress.  Cardiovascular: Normal rate, regular rhythm, normal heart sounds and intact distal pulses.  Exam reveals no gallop and no friction rub.   No murmur  heard. Pulmonary/Chest: Effort normal and breath sounds normal. No respiratory distress. She has no wheezes. She has no rales. She exhibits no tenderness.  Abdominal: There is CVA tenderness (right side).  Musculoskeletal:  In motorized scooter   Neurological: She is alert and oriented to person, place, and time.  Skin:  Skin is warm and dry. She is not diaphoretic.  Psychiatric: She has a normal mood and affect. Her behavior is normal. Judgment and thought content normal.  Nursing note and vitals reviewed.     Assessment & Plan:  1. Pyelonephritis - ciprofloxacin (CIPRO) 500 MG tablet; Take 1 tablet (500 mg total) by mouth 2 (two) times daily.  Dispense: 6 tablet; Refill: 0 - POC Urinalysis Dipstick + leuks, protein, and blood  - Culture, Urine - Follow up if no improvement in the next 2-3 days or sooner if fever develops   Dorothyann Peng, NP

## 2017-04-03 LAB — URINE CULTURE
MICRO NUMBER:: 81166182
SPECIMEN QUALITY:: ADEQUATE

## 2017-04-06 ENCOUNTER — Telehealth: Payer: Self-pay | Admitting: Family Medicine

## 2017-04-06 NOTE — Telephone Encounter (Signed)
Pt saw cory on 04/02/17 for kidney infection and was given only 6 abx pills. Pt would like more abx call into [facility] [address]. Pt also needs something for pain.

## 2017-04-06 NOTE — Telephone Encounter (Signed)
Urine culture has come back and did not show a UTI. If she is still having symptoms then she should follow up

## 2017-04-07 NOTE — Telephone Encounter (Signed)
Since her cx was negative, no clear indication for more antibiotics- as per Cory's recommendations.  Has she tried OTC AZO?

## 2017-04-07 NOTE — Telephone Encounter (Signed)
Patient states that when she usually has a UTI she gets cipro for 10 days.  She only got 6 tabs.  She tried to get in with urology but could not get an appointment.  She would like to know if she can have her cipro for a few more days?   [facility]

## 2017-04-07 NOTE — Telephone Encounter (Signed)
Patient is aware 

## 2017-04-08 DIAGNOSIS — H26492 Other secondary cataract, left eye: Secondary | ICD-10-CM | POA: Diagnosis not present

## 2017-04-10 ENCOUNTER — Ambulatory Visit (INDEPENDENT_AMBULATORY_CARE_PROVIDER_SITE_OTHER): Payer: Medicare Other | Admitting: Family Medicine

## 2017-04-10 VITALS — BP 110/70 | HR 71 | Temp 98.2°F

## 2017-04-10 DIAGNOSIS — Z23 Encounter for immunization: Secondary | ICD-10-CM

## 2017-04-10 DIAGNOSIS — E785 Hyperlipidemia, unspecified: Secondary | ICD-10-CM | POA: Diagnosis not present

## 2017-04-10 DIAGNOSIS — E119 Type 2 diabetes mellitus without complications: Secondary | ICD-10-CM | POA: Diagnosis not present

## 2017-04-10 DIAGNOSIS — I10 Essential (primary) hypertension: Secondary | ICD-10-CM | POA: Diagnosis not present

## 2017-04-10 LAB — POCT GLYCOSYLATED HEMOGLOBIN (HGB A1C): Hemoglobin A1C: 7.7

## 2017-04-10 MED ORDER — SITAGLIPTIN PHOSPHATE 100 MG PO TABS: 100.0000 mg | ORAL_TABLET | Freq: Every day | ORAL | 5 refills | Status: DC

## 2017-04-10 NOTE — Progress Notes (Signed)
Subjective:     Patient ID: Jordan [name], female   DOB: 1945/08/27, 71 y.o.   MRN: [id number]  HPI Patient seen for medical follow-up  Type 2 diabetes. History of good control. Last A1c 6.9%. Fasting blood sugars have been about 20 points higher over the past couple months. No recent dietary changes. Takes metformin regularly. Good compliance with medication.  Hypertension. Well controlled currently. No headaches. No chest pains.  Dyslipidemia. She has history of high triglycerides by recent lipids but cholesterol and LDL were well controlled. She has history of chronic daily disease and recent creatinine clearance was 48.  Past Medical History:  Diagnosis Date  . Arthritis    OA  . Blood transfusion 1992   AFTER  SURGERY TO REMOVE BLADDER  . 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 COLONIC POLYPS 07/11/2010  . Diabetes mellitus    ORAL MED FOR DIABETES  . GERD (gastroesophageal reflux disease)    PAST HISTORY GERD--WATCHES DIET - NOT ON ANY MEDS FOR GERD  . HYPERLIPIDEMIA 11/29/2009  . HYPERTENSION 11/29/2009  . Incisional hernia    NO PAIN-NOT CAUSING ANY DISCOMFORT  . PONV (postoperative nausea and vomiting)   . Presence of urostomy [facility])    11-28-14 remains   Past Surgical History:  Procedure Laterality Date  . ABDOMINAL HYSTERECTOMY  1990   partial  . APPENDECTOMY    . CHOLECYSTECTOMY    . COLONOSCOPY WITH PROPOFOL N/A 12/07/2014   Procedure: COLONOSCOPY WITH PROPOFOL;  Surgeon: Gatha Mayer, MD;  Location: [facility];  Service: Endoscopy;  Laterality: N/A;  . LEFT CARPAL TUNNEL RELEASE X 2    . LUMBAR SYMPATHETECTOMIES X 2    . NEPHROLITHOTOMY  10/01/2011   Procedure: NEPHROLITHOTOMY PERCUTANEOUS;  Surgeon: Franchot Gallo, MD;  Location: [facility];  Service: Urology;  Laterality: Right;       . radical cystectomy  1992   neurogenic bladder sec CP   . REVISION UROSTOMY CUTANEOUS  1992  . RIGHT CARPAL TUNNEL RELEASE    . RT ANKLE FUSION  1975   SEVERAL RT ANKLE SURGERIES PRIOR TO THE FUSION  . 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 TONSILLECTOMY      reports that she has never smoked. She has never used smokeless tobacco. She reports that she does not drink alcohol or use drugs. family history includes Bladder Cancer in her father; Colon cancer (age of onset: 24) in her father; Colon polyps in her sister; Emphysema in her father; Heart attack in her maternal grandfather; Heart disease in her father and maternal grandmother; Heart disease (age of onset: 58) in her mother. Allergies  Allergen Reactions  . Morphine Shortness Of Breath  . Baclofen     GI upset  . Lisinopril Cough  . Metoclopramide Hcl     Reglan, irritable  . Penicillins     " MAJOR CASE OF HIVES"     Review of Systems  Constitutional: Negative for fatigue.  Eyes: Negative for visual disturbance.  Respiratory: Negative for cough, chest tightness, shortness of breath and wheezing.   Cardiovascular: Positive for leg swelling. Negative for chest pain and palpitations.  Endocrine: Negative for polydipsia and polyuria.  Neurological: Negative for dizziness, seizures, syncope, weakness, light-headedness and headaches.       Objective:   Physical Exam  Constitutional: She is oriented to  person, place, and time. She appears well-developed and well-nourished.  Cardiovascular: Normal rate and regular rhythm.   Pulmonary/Chest: Effort normal and breath sounds normal. No respiratory distress. She has no wheezes. She has no rales.  Musculoskeletal: She exhibits edema.  Neurological: She is alert and oriented to person, place, and time.       Assessment:     #1 hypertension stable and at goal  #2 type 2 diabetes poorly controlled with A1c today up to 7.7%  #3 dyslipidemia    Plan:     -Discussed dietary management of high triglycerides -Start  Januvia 100 mg once daily and continue metformin -She is encouraged try to lose some weight and reduce high glycemic foods and plan 3 month follow-up -Flu vaccine given  [name] MD [facility] at [facility]

## 2017-04-11 ENCOUNTER — Other Ambulatory Visit: Payer: Self-pay | Admitting: Family Medicine

## 2017-04-13 ENCOUNTER — Telehealth: Payer: Self-pay | Admitting: Family Medicine

## 2017-04-13 NOTE — Telephone Encounter (Signed)
Pt states she cannot afford the sitaGLIPtin (JANUVIA) 100 MG tablet which is $128 /mo. (dr Elease Hashimoto just put her on)  Pt states her BS was 170 today so it is still running high with metformin  Please advise

## 2017-04-14 NOTE — Telephone Encounter (Signed)
Patient is aware 

## 2017-04-14 NOTE — Telephone Encounter (Signed)
With await PCP response to this. Please forward to PCP. In the interim, she may want to check the cost of this at other pharmacies. Sometimes [facility] or the [employer] is cheaper.Thank you.

## 2017-04-19 NOTE — Telephone Encounter (Signed)
Unfortunately, unless with go with sulfonylurea class (which could cause weight gain and hypoglycemia risk) all others are likely to be more costly than older generics.   She is not a good candidate for SGLT-2 class b/o her hx of frequent UTI.  Would be helpful if she can check with her insurance to see what they will cover at lower co-pay (other than Metformin).  Onglyza is in same class as Januvia- wonder if they would cover it any better than Januvia?

## 2017-04-20 NOTE — Telephone Encounter (Signed)
Spoke with patient and she will call her insurance company then call us back.

## 2017-04-21 ENCOUNTER — Telehealth: Payer: Self-pay | Admitting: Family Medicine

## 2017-04-21 NOTE — Telephone Encounter (Signed)
Pt has check with her insurance company and they will cover onglyza after 170 dollars the copay will be 45.00 for 30 day supply. [facility] [address]

## 2017-04-21 NOTE — Telephone Encounter (Signed)
Start Onglyza 2.5 mg once daily #30 with 5 refills

## 2017-04-22 MED ORDER — SAXAGLIPTIN HCL 2.5 MG PO TABS: 2.5000 mg | ORAL_TABLET | Freq: Every day | ORAL | 5 refills | Status: DC

## 2017-04-22 NOTE — Telephone Encounter (Signed)
Rx sent and patient is aware. 

## 2017-04-25 ENCOUNTER — Other Ambulatory Visit: Payer: Self-pay | Admitting: Family Medicine

## 2017-05-16 ENCOUNTER — Other Ambulatory Visit: Payer: Self-pay | Admitting: Family Medicine

## 2017-06-11 DIAGNOSIS — Z936 Other artificial openings of urinary tract status: Secondary | ICD-10-CM | POA: Diagnosis not present

## 2017-06-24 ENCOUNTER — Other Ambulatory Visit: Payer: Self-pay | Admitting: Family Medicine

## 2017-07-06 ENCOUNTER — Encounter: Payer: Self-pay | Admitting: Family Medicine

## 2017-07-08 DIAGNOSIS — H40013 Open angle with borderline findings, low risk, bilateral: Secondary | ICD-10-CM | POA: Diagnosis not present

## 2017-07-08 DIAGNOSIS — H04123 Dry eye syndrome of bilateral lacrimal glands: Secondary | ICD-10-CM | POA: Diagnosis not present

## 2017-07-08 DIAGNOSIS — H11153 Pinguecula, bilateral: Secondary | ICD-10-CM | POA: Diagnosis not present

## 2017-07-08 DIAGNOSIS — H16223 Keratoconjunctivitis sicca, not specified as Sjogren's, bilateral: Secondary | ICD-10-CM | POA: Diagnosis not present

## 2017-07-10 ENCOUNTER — Ambulatory Visit (INDEPENDENT_AMBULATORY_CARE_PROVIDER_SITE_OTHER): Payer: Medicare Other | Admitting: Family Medicine

## 2017-07-10 VITALS — HR 72 | Temp 98.5°F

## 2017-07-10 DIAGNOSIS — E118 Type 2 diabetes mellitus with unspecified complications: Secondary | ICD-10-CM

## 2017-07-10 DIAGNOSIS — Z794 Long term (current) use of insulin: Secondary | ICD-10-CM

## 2017-07-10 LAB — POCT GLYCOSYLATED HEMOGLOBIN (HGB A1C): Hemoglobin A1C: 6.6

## 2017-07-10 NOTE — Patient Instructions (Signed)
Stop the Onglyza We will set up diabetes education Let's plan on 3 month follow up.

## 2017-07-10 NOTE — Progress Notes (Signed)
Subjective:     Patient ID: Jordan [name], female   DOB: 12-Oct-1945, 72 y.o.   MRN: [id number]  HPI   Patient seen for follow-up regarding type 2 diabetes. Recent A1c 7.7%. We added Onglyza 2.5 mgs daily. However, she has consistent headaches and when she left off the Onglyza the headaches resolved. She has made some positive dietary changes since then. A1c today down to 6.6%.  Past Medical History:  Diagnosis Date  . Arthritis    OA  . Blood transfusion 1992   AFTER  SURGERY TO REMOVE BLADDER  . 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 COLONIC POLYPS 07/11/2010  . Diabetes mellitus    ORAL MED FOR DIABETES  . GERD (gastroesophageal reflux disease)    PAST HISTORY GERD--WATCHES DIET - NOT ON ANY MEDS FOR GERD  . HYPERLIPIDEMIA 11/29/2009  . HYPERTENSION 11/29/2009  . Incisional hernia    NO PAIN-NOT CAUSING ANY DISCOMFORT  . PONV (postoperative nausea and vomiting)   . Presence of urostomy [facility])    11-28-14 remains   Past Surgical History:  Procedure Laterality Date  . ABDOMINAL HYSTERECTOMY  1990   partial  . APPENDECTOMY    . CHOLECYSTECTOMY    . COLONOSCOPY WITH PROPOFOL N/A 12/07/2014   Procedure: COLONOSCOPY WITH PROPOFOL;  Surgeon: Gatha Mayer, MD;  [facility];  Service: Endoscopy;  Laterality: N/A;  . LEFT CARPAL TUNNEL RELEASE X 2    . LUMBAR SYMPATHETECTOMIES X 2    . NEPHROLITHOTOMY  10/01/2011   Procedure: NEPHROLITHOTOMY PERCUTANEOUS;  Surgeon: Franchot Gallo, MD;  [facility];  Service: Urology;  Laterality: Right;       . radical cystectomy  1992   neurogenic bladder sec CP  . REVISION UROSTOMY CUTANEOUS  1992  . RIGHT CARPAL TUNNEL RELEASE    . RT ANKLE FUSION  1975   SEVERAL RT ANKLE SURGERIES PRIOR TO THE FUSION  . 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 TONSILLECTOMY      reports that  has never smoked.  she has never used smokeless tobacco. She reports that she does not drink alcohol or use drugs. family history includes Bladder Cancer in her father; Colon cancer (age of onset: 31) in her father; Colon polyps in her sister; Emphysema in her father; Heart attack in her maternal grandfather; Heart disease in her father and maternal grandmother; Heart disease (age of onset: 64) in her mother. Allergies  Allergen Reactions  . Morphine Shortness Of Breath  . Baclofen     GI upset  . Lisinopril Cough  . Metoclopramide Hcl     Reglan, irritable  . Penicillins     " MAJOR CASE OF HIVES"     Review of Systems  Constitutional: Negative for fatigue.  Eyes: Negative for visual disturbance.  Respiratory: Negative for cough, chest tightness, shortness of breath and wheezing.   Cardiovascular: Negative for chest pain, palpitations and leg swelling.  Endocrine: Negative for polydipsia and polyuria.  Neurological: Negative for dizziness, seizures, syncope, weakness, light-headedness and headaches.       Objective:   Physical Exam  Constitutional: She appears well-developed and well-nourished.  Eyes: Pupils are equal, round, and reactive to light.  Neck: Neck supple. No JVD present. No thyromegaly present.  Cardiovascular: Normal rate and regular rhythm. Exam reveals no gallop.  Pulmonary/Chest: Effort normal  and breath sounds normal. No respiratory distress. She has no wheezes. She has no rales.  Musculoskeletal: She exhibits no edema.  Neurological: She is alert.       Assessment:     Type 2 diabetes improved with A1c today 6.6%. Probable side effect from Onglyza with headache    Plan:     -Discontinue Onglyza -Set up diabetes education -We discussed options including potential alternative medications (consider GLP-1 class) versus 3 month trial of continued lifestyle modification and reassess A1c and she prefers the latter  [name] MD [facility] at [facility]

## 2017-07-22 ENCOUNTER — Other Ambulatory Visit: Payer: Self-pay | Admitting: Family Medicine

## 2017-07-24 ENCOUNTER — Encounter: Payer: Medicare Other | Attending: Family Medicine | Admitting: Dietician

## 2017-07-24 ENCOUNTER — Encounter: Payer: Self-pay | Admitting: Dietician

## 2017-07-24 DIAGNOSIS — E118 Type 2 diabetes mellitus with unspecified complications: Secondary | ICD-10-CM | POA: Insufficient documentation

## 2017-07-24 DIAGNOSIS — Z713 Dietary counseling and surveillance: Secondary | ICD-10-CM | POA: Insufficient documentation

## 2017-07-24 DIAGNOSIS — Z794 Long term (current) use of insulin: Secondary | ICD-10-CM | POA: Diagnosis not present

## 2017-07-24 NOTE — Patient Instructions (Signed)
Continue to make mindful food choices. Continue with portion control Increase your non starchy vegetables. Consider armchair exercise.  Aim for 30 minutes per day.  Little bits add up.  Consider chair yoga. Eat slowly.    Aim for 2-3 Carb Choices per meal (30-45 grams) +/- 1 either way  Aim for 0-1 Carbs per snack if hungry  Include protein in moderation with your meals and snacks Consider reading food labels for Total Carbohydrate and Fat Grams of foods Consider checking BG at alternate times per day as directed by MD  Continue taking medication as directed by MD

## 2017-07-24 NOTE — Progress Notes (Signed)
Diabetes Self-Management Education  Visit Type: First/Initial  Appt. Start Time: 1015 Appt. End Time: 1140  07/24/2017  Ms. Jordan [name], identified by name and date of birth, is a 72 y.o. female with a diagnosis of Diabetes: Type 2. Other history includes HTN, GERD, hyperlipidemia, urostomy, Cerebral Palsy.  She has been in a wheel chair since 1986 after hip surgery.  She has poor use of one leg.  Patient was last seen by our department in 2012.  She has gotten out her old information and has been following this most recently.  She has reduced her A1C from 7.7% 03/2017 to 6.6 06/2017.   Medications include Metformin.  She tried Onglyza but did not tolerate this due to headache.   Her wieght has been stable recently between 185 lbs and 190 lbs.  Patient lives with her husband.  She drives.  They share shopping and cooking.  They live in a handicapped accessible apartment.  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  She is going next week to get a new service dog.  Her husband also has a Neurosurgeon.    ASSESSMENT  Height 5' 9.5" (1.765 m), weight 190 lb (86.2 kg). Body mass index is 27.66 kg/m.  Diabetes Self-Management Education - 07/24/17 1044      Visit Information   Visit Type  First/Initial      Initial Visit   Diabetes Type  Type 2    Are you currently following a meal plan?  Yes    What type of meal plan do you follow?  diabetic    Are you taking your medications as prescribed?  Yes    Date Diagnosed  2006      Health Coping   How would you rate your overall health?  Fair      Psychosocial Assessment   Patient Belief/Attitude about Diabetes  Motivated to manage diabetes    Self-care barriers  Debilitated state due to current medical condition    Self-management support  Doctor's office;Family    Other persons present  Patient    Patient Concerns   Nutrition/Meal planning;Glycemic Control;Weight Control    Special Needs  None    Preferred Learning Style  No preference indicated    Learning Readiness  Ready    How often do you need to have someone help you when you read instructions, pamphlets, or other written materials from your doctor or pharmacy?  1 - Never    What is the last grade level you completed in school?  6 years college      Pre-Education Assessment   Patient understands the diabetes disease and treatment process.  Needs Review    Patient understands incorporating nutritional management into lifestyle.  Needs Review    Patient undertands incorporating physical activity into lifestyle.  Needs Review    Patient understands using medications safely.  Needs Review    Patient understands monitoring blood glucose, interpreting and using results  Needs Review    Patient understands prevention, detection, and treatment of acute complications.  Needs Review    Patient understands prevention, detection, and treatment of chronic complications.  Needs Review    Patient understands how to develop strategies to address psychosocial issues.  Needs Review    Patient understands how to develop strategies to promote health/change behavior.  Needs Review      Complications   Last  HgB A1C per patient/outside source  2012 %    How often do you check your blood sugar?  1-2 times/day    Fasting Blood glucose range (mg/dL)  70-129;130-179 140    Postprandial Blood glucose range (mg/dL)  130-179;180-200    Number of hypoglycemic episodes per month  0    Number of hyperglycemic episodes per week  0    Have you had a dilated eye exam in the past 12 months?  Yes    Have you had a dental exam in the past 12 months?  Yes    Are you checking your feet?  Yes    How many days per week are you checking your feet?  7      Dietary Intake   Breakfast  Egg, 1/2 slice cheese, lite mayo, English muffin OR fruit, cottage cheese,, 1/2 English muffin  6:30-8:15    Snack (morning)  occasional fruit or wheat thins and cream cheese (2)    Lunch  sandwich OR soup OR fast food (chick fillet sandwich or salad)    Snack (afternoon)  occasional fruit and occasional cottage cheese or PB and celery or cream cheese and celery    Dinner  meat (lean 2-3 ounces), vegetables, sometimes sweet potato, occasional maccaroni, pasta, rice 6    Snack (evening)  crackers and cheese or fruit    Beverage(s)  water , unsweetened tea with stevia, occasional diet coke or diet gingerale  (80 plus ounces of fluice per day)      Exercise   Exercise Type  ADL's used to use a hand bike or hand weights      Patient Education   Previous Diabetes Education  Yes (please comment) 2012 and Nutrition and Diabetes Education    Disease state   Other (comment) review of insulin resistance    Nutrition management   Role of diet in the treatment of diabetes and the relationship between the three main macronutrients and blood glucose level;Food label reading, portion sizes and measuring food.;Meal options for control of blood glucose level and chronic complications.;Information on hints to eating out and maintain blood glucose control.    Physical activity and exercise   Role of exercise on diabetes management, blood pressure control and cardiac health.    Medications  Reviewed patients medication for diabetes, action, purpose, timing of dose and side effects.    Monitoring  Purpose and frequency of SMBG.;Identified appropriate SMBG and/or A1C goals.;Daily foot exams;Yearly dilated eye exam    Chronic complications  Relationship between chronic complications and blood glucose control    Psychosocial adjustment  Worked with patient to identify barriers to care and solutions    Personal strategies to promote health  Lifestyle issues that need to be addressed for better diabetes care      Individualized Goals (developed by patient)   Nutrition  General guidelines for healthy choices and  portions discussed    Physical Activity  Exercise 3-5 times per week;15 minutes per day    Medications  take my medication as prescribed    Monitoring   test my blood glucose as discussed    Reducing Risk  examine blood glucose patterns    Health Coping  discuss diabetes with (comment) MD, RD, CDE      Post-Education Assessment   Patient understands the diabetes disease and treatment process.  Demonstrates understanding / competency    Patient understands incorporating nutritional management into lifestyle.  Demonstrates understanding / competency    Patient undertands  incorporating physical activity into lifestyle.  Demonstrates understanding / competency    Patient understands using medications safely.  Demonstrates understanding / competency    Patient understands monitoring blood glucose, interpreting and using results  Demonstrates understanding / competency    Patient understands prevention, detection, and treatment of acute complications.  Demonstrates understanding / competency    Patient understands prevention, detection, and treatment of chronic complications.  Demonstrates understanding / competency    Patient understands how to develop strategies to address psychosocial issues.  Demonstrates understanding / competency    Patient understands how to develop strategies to promote health/change behavior.  Demonstrates understanding / competency      Outcomes   Expected Outcomes  Demonstrated interest in learning. Expect positive outcomes    Future DMSE  PRN    Program Status  Completed       Individualized Plan for Diabetes Self-Management Training:   Learning Objective:  Patient will have a greater understanding of diabetes self-management. Patient education plan is to attend individual and/or group sessions per assessed needs and concerns.   Plan:   Patient Instructions  Continue to make mindful food choices. Continue with portion control Increase your non starchy  vegetables. Consider armchair exercise.  Aim for 30 minutes per day.  Little bits add up.  Consider chair yoga. Eat slowly.    Aim for 2-3 Carb Choices per meal (30-45 grams) +/- 1 either way  Aim for 0-1 Carbs per snack if hungry  Include protein in moderation with your meals and snacks Consider reading food labels for Total Carbohydrate and Fat Grams of foods Consider checking BG at alternate times per day as directed by MD  Continue taking medication as directed by MD      Expected Outcomes:  Demonstrated interest in learning. Expect positive outcomes  Education material provided: Living Well with Diabetes, Food label handouts, A1C conversion sheet, Meal plan card, My Plate and Snack sheet  If problems or questions, patient to contact team via:  Phone  Future DSME appointment: PRN

## 2017-08-06 DIAGNOSIS — G809 Cerebral palsy, unspecified: Secondary | ICD-10-CM | POA: Diagnosis not present

## 2017-08-06 DIAGNOSIS — S81801D Unspecified open wound, right lower leg, subsequent encounter: Secondary | ICD-10-CM | POA: Diagnosis not present

## 2017-08-06 DIAGNOSIS — E119 Type 2 diabetes mellitus without complications: Secondary | ICD-10-CM | POA: Diagnosis not present

## 2017-08-07 DIAGNOSIS — Z936 Other artificial openings of urinary tract status: Secondary | ICD-10-CM | POA: Diagnosis not present

## 2017-09-02 DIAGNOSIS — M793 Panniculitis, unspecified: Secondary | ICD-10-CM | POA: Diagnosis not present

## 2017-09-02 DIAGNOSIS — I872 Venous insufficiency (chronic) (peripheral): Secondary | ICD-10-CM | POA: Diagnosis not present

## 2017-09-02 DIAGNOSIS — I8310 Varicose veins of unspecified lower extremity with inflammation: Secondary | ICD-10-CM | POA: Diagnosis not present

## 2017-09-10 ENCOUNTER — Other Ambulatory Visit: Payer: Self-pay | Admitting: Internal Medicine

## 2017-09-19 ENCOUNTER — Ambulatory Visit (INDEPENDENT_AMBULATORY_CARE_PROVIDER_SITE_OTHER): Payer: Medicare Other | Admitting: Family Medicine

## 2017-09-19 ENCOUNTER — Encounter: Payer: Self-pay | Admitting: Family Medicine

## 2017-09-19 VITALS — BP 114/78 | HR 77 | Temp 98.2°F

## 2017-09-19 DIAGNOSIS — J329 Chronic sinusitis, unspecified: Secondary | ICD-10-CM

## 2017-09-19 DIAGNOSIS — B9689 Other specified bacterial agents as the cause of diseases classified elsewhere: Secondary | ICD-10-CM | POA: Diagnosis not present

## 2017-09-19 MED ORDER — DOXYCYCLINE HYCLATE 100 MG PO TABS: 100.0000 mg | ORAL_TABLET | Freq: Two times a day (BID) | ORAL | 0 refills | Status: DC

## 2017-09-19 NOTE — Progress Notes (Signed)
   Subjective:    Patient ID: Jordan [name], female    DOB: May 18, 1946, 72 y.o.   MRN: [id number]  HPI 'i've had this sinus stuff for about a month'- recently it has worsened, nasal congestion is thicker and yellow, +PND, cough productive of yellow sputum.  No fever.  Using Nasacort w/o relief, no improvement w/ Zyrtec.  + maxillary sinus pressure.  + intermittent HA.  No known sick contacts. Hx of sinus infxn previously.   Review of Systems For ROS see HPI     Objective:   Physical Exam  Constitutional: She appears well-developed and well-nourished. No distress.  HENT:  Head: Normocephalic and atraumatic.  Right Ear: Tympanic membrane normal.  Left Ear: Tympanic membrane normal.  Nose: Mucosal edema and rhinorrhea present. Right sinus exhibits maxillary sinus tenderness. Right sinus exhibits no frontal sinus tenderness. Left sinus exhibits maxillary sinus tenderness. Left sinus exhibits no frontal sinus tenderness.  Mouth/Throat: Uvula is midline and mucous membranes are normal. Posterior oropharyngeal erythema present. No oropharyngeal exudate.  Eyes: Pupils are equal, round, and reactive to light. Conjunctivae and EOM are normal.  Neck: Normal range of motion. Neck supple.  Cardiovascular: Normal rate, regular rhythm and normal heart sounds.  Pulmonary/Chest: Effort normal and breath sounds normal. No respiratory distress. She has no wheezes.  Lymphadenopathy:    She has no cervical adenopathy.  Vitals reviewed.         Assessment & Plan:  Sinusitis- pt's sxs and PE consistent w/ infxn.  Given the recent change in her ongoing 'sinus' symptoms, I suspect bacterial infxn.  Start abx.  Reviewed supportive care and red flags that should prompt return.  Pt expressed understanding and is in agreement w/ plan.

## 2017-09-19 NOTE — Patient Instructions (Signed)
Follow up as needed or as scheduled START the Doxycycline twice daily- take w/ food Drink plenty of fluids Continue daily Zyrtec- the pollen is terrible! Call with any questions or concerns Hang in there!

## 2017-10-05 DIAGNOSIS — Z936 Other artificial openings of urinary tract status: Secondary | ICD-10-CM | POA: Diagnosis not present

## 2017-10-09 ENCOUNTER — Ambulatory Visit (INDEPENDENT_AMBULATORY_CARE_PROVIDER_SITE_OTHER): Payer: Medicare Other | Admitting: Family Medicine

## 2017-10-09 ENCOUNTER — Encounter: Payer: Self-pay | Admitting: Family Medicine

## 2017-10-09 VITALS — BP 122/78 | HR 67 | Temp 98.2°F

## 2017-10-09 DIAGNOSIS — I10 Essential (primary) hypertension: Secondary | ICD-10-CM | POA: Diagnosis not present

## 2017-10-09 DIAGNOSIS — E785 Hyperlipidemia, unspecified: Secondary | ICD-10-CM

## 2017-10-09 DIAGNOSIS — E118 Type 2 diabetes mellitus with unspecified complications: Secondary | ICD-10-CM

## 2017-10-09 DIAGNOSIS — Z794 Long term (current) use of insulin: Secondary | ICD-10-CM | POA: Diagnosis not present

## 2017-10-09 LAB — BASIC METABOLIC PANEL
BUN: 45 mg/dL — AB (ref 6–23)
CO2: 22 meq/L (ref 19–32)
CREATININE: 1.18 mg/dL (ref 0.40–1.20)
Calcium: 9.8 mg/dL (ref 8.4–10.5)
Chloride: 107 mEq/L (ref 96–112)
GFR: 47.67 mL/min — ABNORMAL LOW (ref >60.00)
GLUCOSE: 117 mg/dL — AB (ref 70–99)
Potassium: 4.2 mEq/L (ref 3.5–5.1)
Sodium: 141 mEq/L (ref 135–145)

## 2017-10-09 LAB — LIPID PANEL
CHOL/HDL RATIO: 3
Cholesterol: 140 mg/dL (ref 0–200)
HDL: 43.2 mg/dL (ref >39.00)
LDL CALC: 62 mg/dL (ref 0–99)
NONHDL: 96.89
Triglycerides: 174 mg/dL — ABNORMAL HIGH (ref 0.0–149.0)
VLDL: 34.8 mg/dL (ref 0.0–40.0)

## 2017-10-09 LAB — HEPATIC FUNCTION PANEL
ALT: 20 U/L (ref 0–35)
AST: 13 U/L (ref 0–37)
Albumin: 4 g/dL (ref 3.5–5.2)
Alkaline Phosphatase: 72 U/L (ref 39–117)
BILIRUBIN DIRECT: 0.1 mg/dL (ref 0.0–0.3)
BILIRUBIN TOTAL: 0.3 mg/dL (ref 0.2–1.2)
TOTAL PROTEIN: 6.9 g/dL (ref 6.0–8.3)

## 2017-10-09 LAB — POCT GLYCOSYLATED HEMOGLOBIN (HGB A1C): Hemoglobin A1C: 6.3

## 2017-10-09 NOTE — Progress Notes (Signed)
Subjective:     Patient ID: Jordan [name], female   DOB: 1946-05-29, 72 y.o.   MRN: [id number]  HPI Patient seen for medical follow-up. She has history of morbid obesity, hypertension, type 2 diabetes, cerebral palsy, hyperlipidemia, history of total cystectomy.  Her blood sugars been stable. Fasting blood sugars usually consistently less than 130. She remains on metformin. She takes simvastatin for hyperlipidemia. No myalgias. She is on losartan for hypertension. Has chronic leg edema which is controlled with compression garments and also takes furosemide 80 mg once daily. Denies any chest pains or dyspnea. Compliant with all medications.  Past Medical History:  Diagnosis Date  . Arthritis    OA  . Blood transfusion 1992   AFTER  SURGERY TO REMOVE BLADDER  . 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 COLONIC POLYPS 07/11/2010  . Diabetes mellitus    ORAL MED FOR DIABETES  . GERD (gastroesophageal reflux disease)    PAST HISTORY GERD--WATCHES DIET - NOT ON ANY MEDS FOR GERD  . HYPERLIPIDEMIA 11/29/2009  . HYPERTENSION 11/29/2009  . Incisional hernia    NO PAIN-NOT CAUSING ANY DISCOMFORT  . PONV (postoperative nausea and vomiting)   . Presence of urostomy [facility])    11-28-14 remains   Past Surgical History:  Procedure Laterality Date  . ABDOMINAL HYSTERECTOMY  1990   partial  . APPENDECTOMY    . CHOLECYSTECTOMY    . COLONOSCOPY WITH PROPOFOL N/A 12/07/2014   Procedure: COLONOSCOPY WITH PROPOFOL;  Surgeon: Gatha Mayer, MD;  [facility];  Service: Endoscopy;  Laterality: N/A;  . LEFT CARPAL TUNNEL RELEASE X 2    . LUMBAR SYMPATHETECTOMIES X 2    . NEPHROLITHOTOMY  10/01/2011   Procedure: NEPHROLITHOTOMY PERCUTANEOUS;  Surgeon: Franchot Gallo, MD;  [facility]: [facility];  Service: Urology;  Laterality: Right;       . radical cystectomy  1992   neurogenic  bladder sec CP  . REVISION UROSTOMY CUTANEOUS  1992  . RIGHT CARPAL TUNNEL RELEASE    . RT ANKLE FUSION  1975   SEVERAL RT ANKLE SURGERIES PRIOR TO THE FUSION  . 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 TONSILLECTOMY      reports that she has never smoked. She has never used smokeless tobacco. She reports that she does not drink alcohol or use drugs. family history includes Bladder Cancer in her father; Colon cancer (age of onset: 66) in her father; Colon polyps in her sister; Emphysema in her father; Heart attack in her maternal grandfather; Heart disease in her father and maternal grandmother; Heart disease (age of onset: 92) in her mother. Allergies  Allergen Reactions  . Morphine Shortness Of Breath  . Onglyza [Saxagliptin] Other (See Comments)    Headache within 45 minutes of taking this.  . Baclofen     GI upset  . Lisinopril Cough  . Metoclopramide Hcl     Reglan, irritable  . Penicillins     " MAJOR CASE OF HIVES"     Review of Systems  Constitutional: Negative for chills, fatigue and fever.  Eyes: Negative for visual disturbance.  Respiratory: Negative for cough, chest tightness, shortness of breath and wheezing.   Cardiovascular: Negative for chest pain, palpitations and leg swelling.  Gastrointestinal: Negative for abdominal pain.  Endocrine: Negative for polydipsia and polyuria.  Neurological: Negative for dizziness,  seizures, syncope, weakness, light-headedness and headaches.       Objective:   Physical Exam  Constitutional: She is oriented to person, place, and time. She appears well-developed and well-nourished.  Neck: Neck supple.  Cardiovascular: Normal rate and regular rhythm.  Pulmonary/Chest: Effort normal and breath sounds normal. No respiratory distress. She has no wheezes. She has no rales.  Musculoskeletal:  Trace nonpitting edema lower legs bilaterally  Neurological: She is alert and oriented to person, place, and time.        Assessment:     #1 hypertension stable and at goal  #2 type 2 diabetes well controlled A1c today 6.3%  #3 dyslipidemia with predominantly high triglycerides. Her cholesterols been fairly well-controlled the past  #4 chronic bilateral leg edema. Suspect largely venous stasis. Controlled with compression garments    Plan:     -Check labs with basic metabolic panel, lipid panel, hepatic panel -We'll plan routine follow-up in 6 months and sooner as needed -She will continue close monitoring of her glucose  [name] MD [facility] at [facility]

## 2017-10-09 NOTE — Patient Instructions (Signed)

## 2017-10-14 ENCOUNTER — Other Ambulatory Visit: Payer: Self-pay | Admitting: Family Medicine

## 2017-10-16 DIAGNOSIS — H40013 Open angle with borderline findings, low risk, bilateral: Secondary | ICD-10-CM | POA: Diagnosis not present

## 2017-10-16 DIAGNOSIS — H35033 Hypertensive retinopathy, bilateral: Secondary | ICD-10-CM | POA: Diagnosis not present

## 2017-12-06 ENCOUNTER — Other Ambulatory Visit: Payer: Self-pay | Admitting: Internal Medicine

## 2018-01-01 ENCOUNTER — Ambulatory Visit (INDEPENDENT_AMBULATORY_CARE_PROVIDER_SITE_OTHER): Payer: Medicare Other | Admitting: Family Medicine

## 2018-01-01 ENCOUNTER — Encounter: Payer: Self-pay | Admitting: Family Medicine

## 2018-01-01 VITALS — BP 120/52 | HR 75 | Temp 98.8°F

## 2018-01-01 DIAGNOSIS — B029 Zoster without complications: Secondary | ICD-10-CM | POA: Diagnosis not present

## 2018-01-01 NOTE — Progress Notes (Signed)
Subjective:     Patient ID: Jordan [name], female   DOB: 04/04/1946, 72 y.o.   MRN: [id number]  HPI Patient seen with skin rash which appeared about week and half ago left forehead and left scalp. Slightly painful. Initially blistery. Scabbing over. No eye involvement. Has minimal about 4 out of 10. She had previous shingles vaccine 2007  Past Medical History:  Diagnosis Date  . Arthritis    OA  . Blood transfusion 1992   AFTER  SURGERY TO REMOVE BLADDER  . 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 COLONIC POLYPS 07/11/2010  . Diabetes mellitus    ORAL MED FOR DIABETES  . GERD (gastroesophageal reflux disease)    PAST HISTORY GERD--WATCHES DIET - NOT ON ANY MEDS FOR GERD  . HYPERLIPIDEMIA 11/29/2009  . HYPERTENSION 11/29/2009  . Incisional hernia    NO PAIN-NOT CAUSING ANY DISCOMFORT  . PONV (postoperative nausea and vomiting)   . Presence of urostomy [facility])    11-28-14 remains   Past Surgical History:  Procedure Laterality Date  . ABDOMINAL HYSTERECTOMY  1990   partial  . APPENDECTOMY    . CHOLECYSTECTOMY    . COLONOSCOPY WITH PROPOFOL N/A 12/07/2014   Procedure: COLONOSCOPY WITH PROPOFOL;  Surgeon: Gatha Mayer, MD;  [facility];  Service: Endoscopy;  Laterality: N/A;  . LEFT CARPAL TUNNEL RELEASE X 2    . LUMBAR SYMPATHETECTOMIES X 2    . NEPHROLITHOTOMY  10/01/2011   Procedure: NEPHROLITHOTOMY PERCUTANEOUS;  Surgeon: Franchot Gallo, MD;  [facility];  Service: Urology;  Laterality: Right;       . radical cystectomy  1992   neurogenic bladder sec CP  . REVISION UROSTOMY CUTANEOUS  1992  . RIGHT CARPAL TUNNEL RELEASE    . RT ANKLE FUSION  1975   SEVERAL RT ANKLE SURGERIES PRIOR TO THE FUSION  . 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 TONSILLECTOMY      reports that she has never smoked. She has never used smokeless  tobacco. She reports that she does not drink alcohol or use drugs. family history includes Bladder Cancer in her father; Colon cancer (age of onset: 15) in her father; Colon polyps in her sister; Emphysema in her father; Heart attack in her maternal grandfather; Heart disease in her father and maternal grandmother; Heart disease (age of onset: 36) in her mother. Allergies  Allergen Reactions  . Morphine Shortness Of Breath  . Onglyza [Saxagliptin] Other (See Comments)    Headache within 45 minutes of taking this.  . Baclofen     GI upset  . Lisinopril Cough  . Metoclopramide Hcl     Reglan, irritable  . Penicillins     " MAJOR CASE OF HIVES"     Review of Systems  Constitutional: Negative for chills and fever.  Eyes: Negative for visual disturbance.  Skin: Positive for rash.  Neurological: Negative for headaches.       Objective:   Physical Exam  Constitutional: She appears well-developed and well-nourished.  Cardiovascular: Normal rate and regular rhythm.  Pulmonary/Chest: Effort normal and breath sounds normal.  Skin: Rash noted.  Patient has slightly erythematous crusted rash left forehead. No eye involvement. Nontender. She has a couple small patches       Assessment:     Shingles involving left forehead. Fortunately, she has  minimal pain    Plan:     -No indication for Valtrex since this is a week and a half old -Follow-up for any progressive pain or other concerns -Consider new shingles vaccine in 1-2 years  [name] MD [facility] at [facility]

## 2018-01-01 NOTE — Patient Instructions (Signed)
Shingles Shingles, which is also known as herpes zoster, is an infection that causes a painful skin rash and fluid-filled blisters. Shingles is not related to genital herpes, which is a sexually transmitted infection. Shingles only develops in people who:  Have had chickenpox.  Have received the chickenpox vaccine. (This is rare.)  What are the causes? Shingles is caused by varicella-zoster virus (VZV). This is the same virus that causes chickenpox. After exposure to VZV, the virus stays in the body in an inactive (dormant) state. Shingles develops if the virus reactivates. This can happen many years after the initial exposure to VZV. It is not known what causes this virus to reactivate. What increases the risk? People who have had chickenpox or received the chickenpox vaccine are at risk for shingles. Infection is more common in people who:  Are older than age 50.  Have a weakened defense (immune) system, such as those with HIV, AIDS, or cancer.  Are taking medicines that weaken the immune system, such as transplant medicines.  Are under great stress.  What are the signs or symptoms? Early symptoms of this condition include itching, tingling, and pain in an area on your skin. Pain may be described as burning, stabbing, or throbbing. A few days or weeks after symptoms start, a painful red rash appears, usually on one side of the body in a bandlike or beltlike pattern. The rash eventually turns into fluid-filled blisters that break open, scab over, and dry up in about 2-3 weeks. At any time during the infection, you may also develop:  A fever.  Chills.  A headache.  An upset stomach.  How is this diagnosed? This condition is diagnosed with a skin exam. Sometimes, skin or fluid samples are taken from the blisters before a diagnosis is made. These samples are examined under a microscope or sent to a lab for testing. How is this treated? There is no specific cure for this condition.  Your health care provider will probably prescribe medicines to help you manage pain, recover more quickly, and avoid long-term problems. Medicines may include:  Antiviral drugs.  Anti-inflammatory drugs.  Pain medicines.  If the area involved is on your face, you may be referred to a specialist, such as an eye doctor (ophthalmologist) or an ear, nose, and throat (ENT) doctor to help you avoid eye problems, chronic pain, or disability. Follow these instructions at home: Medicines  Take medicines only as directed by your health care provider.  Apply an anti-itch or numbing cream to the affected area as directed by your health care provider. Blister and Rash Care  Take a cool bath or apply cool compresses to the area of the rash or blisters as directed by your health care provider. This may help with pain and itching.  Keep your rash covered with a loose bandage (dressing). Wear loose-fitting clothing to help ease the pain of material rubbing against the rash.  Keep your rash and blisters clean with mild soap and cool water or as directed by your health care provider.  Check your rash every day for signs of infection. These include redness, swelling, and pain that lasts or increases.  Do not pick your blisters.  Do not scratch your rash. General instructions  Rest as directed by your health care provider.  Keep all follow-up visits as directed by your health care provider. This is important.  Until your blisters scab over, your infection can cause chickenpox in people who have never had it or been vaccinated   against it. To prevent this from happening, avoid contact with other people, especially: ? Babies. ? Pregnant women. ? Children who have eczema. ? Elderly people who have transplants. ? People who have chronic illnesses, such as leukemia or AIDS. Contact a health care provider if:  Your pain is not relieved with prescribed medicines.  Your pain does not get better after  the rash heals.  Your rash looks infected. Signs of infection include redness, swelling, and pain that lasts or increases. Get help right away if:  The rash is on your face or nose.  You have facial pain, pain around your eye area, or loss of feeling on one side of your face.  You have ear pain or you have ringing in your ear.  You have loss of taste.  Your condition gets worse. This information is not intended to replace advice given to you by your health care provider. Make sure you discuss any questions you have with your health care provider. Document Released: 06/02/2005 Document Revised: 01/27/2016 Document Reviewed: 04/13/2014 Elsevier Interactive Patient Education  2018 Elsevier Inc.  

## 2018-01-15 ENCOUNTER — Ambulatory Visit (INDEPENDENT_AMBULATORY_CARE_PROVIDER_SITE_OTHER): Payer: Medicare Other | Admitting: Family Medicine

## 2018-01-15 ENCOUNTER — Encounter: Payer: Self-pay | Admitting: Family Medicine

## 2018-01-15 VITALS — BP 110/80 | HR 69 | Temp 98.1°F

## 2018-01-15 DIAGNOSIS — S80811A Abrasion, right lower leg, initial encounter: Secondary | ICD-10-CM | POA: Diagnosis not present

## 2018-01-15 DIAGNOSIS — R600 Localized edema: Secondary | ICD-10-CM

## 2018-01-15 NOTE — Patient Instructions (Signed)
Elevate leg as much as possible  Let's plan on one week follow up.  This should heal with good compression,

## 2018-01-15 NOTE — Progress Notes (Signed)
Subjective:     Patient ID: Jordan [name], female   DOB: Feb 12, 1946, 72 y.o.   MRN: [id number]  HPI Patient is seen with what appears to be small abrasion right lower leg. Over year ago she had very complicated wound which was very slow to heal and complicated by her chronic edema. She was followed by wound management and eventually this fully healed over. Her current wound is near the site of her previous scar. She is not aware of any recent injury. She first noted current skin lesion yesterday  Past Medical History:  Diagnosis Date  . Arthritis    OA  . Blood transfusion 1992   AFTER  SURGERY TO REMOVE BLADDER  . 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 COLONIC POLYPS 07/11/2010  . Diabetes mellitus    ORAL MED FOR DIABETES  . GERD (gastroesophageal reflux disease)    PAST HISTORY GERD--WATCHES DIET - NOT ON ANY MEDS FOR GERD  . HYPERLIPIDEMIA 11/29/2009  . HYPERTENSION 11/29/2009  . Incisional hernia    NO PAIN-NOT CAUSING ANY DISCOMFORT  . PONV (postoperative nausea and vomiting)   . Presence of urostomy [facility])    11-28-14 remains   Past Surgical History:  Procedure Laterality Date  . ABDOMINAL HYSTERECTOMY  1990   partial  . APPENDECTOMY    . CHOLECYSTECTOMY    . COLONOSCOPY WITH PROPOFOL N/A 12/07/2014   Procedure: COLONOSCOPY WITH PROPOFOL;  Surgeon: Gatha Mayer, MD;  [facility];  Service: Endoscopy;  Laterality: N/A;  . LEFT CARPAL TUNNEL RELEASE X 2    . LUMBAR SYMPATHETECTOMIES X 2    . NEPHROLITHOTOMY  10/01/2011   Procedure: NEPHROLITHOTOMY PERCUTANEOUS;  Surgeon: Franchot Gallo, MD;  [facility];  Service: Urology;  Laterality: Right;       . radical cystectomy  1992   neurogenic bladder sec CP  . REVISION UROSTOMY CUTANEOUS  1992  . RIGHT CARPAL TUNNEL RELEASE    . RT ANKLE FUSION  1975   SEVERAL RT ANKLE SURGERIES PRIOR TO  THE FUSION  . 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 TONSILLECTOMY      reports that she has never smoked. She has never used smokeless tobacco. She reports that she does not drink alcohol or use drugs. family history includes Bladder Cancer in her father; Colon cancer (age of onset: 65) in her father; Colon polyps in her sister; Emphysema in her father; Heart attack in her maternal grandfather; Heart disease in her father and maternal grandmother; Heart disease (age of onset: 50) in her mother. Allergies  Allergen Reactions  . Morphine Shortness Of Breath  . Onglyza [Saxagliptin] Other (See Comments)    Headache within 45 minutes of taking this.  . Baclofen     GI upset  . Lisinopril Cough  . Metoclopramide Hcl     Reglan, irritable  . Penicillins     " MAJOR CASE OF HIVES"     Review of Systems  Constitutional: Negative for chills and fever.       Objective:   Physical Exam  Constitutional: She appears well-developed and well-nourished.  Cardiovascular: Normal rate and regular rhythm.  Pulmonary/Chest: Effort normal and breath sounds normal.  Skin:  Superficial abraded area 0.5 x 1 cm right lower leg anteriorly. This is in the general vicinity of where she had complex  wound year ago. No surrounding cellulitis changes. No drainage.       Assessment:     Small abrasion-type lesion right leg.  High risk for poor healing because of her chronic edema    Plan:     -Compression wrap applied and elevate legs frequently -Return in one week to reassess  [name] MD [facility] at [facility]

## 2018-01-18 ENCOUNTER — Telehealth: Payer: Self-pay | Admitting: Family Medicine

## 2018-01-18 ENCOUNTER — Other Ambulatory Visit: Payer: Self-pay | Admitting: *Deleted

## 2018-01-18 ENCOUNTER — Other Ambulatory Visit: Payer: Self-pay

## 2018-01-18 MED ORDER — SIMVASTATIN 40 MG PO TABS: ORAL_TABLET | ORAL | 0 refills | Status: DC

## 2018-01-18 NOTE — Patient Outreach (Signed)
[facility]) Care Management  01/18/2018  Skylor Hughson Oct 20, 1945 [id number]   Medication Adherence call to [name] spoke with patient she said she is no longer using [facility] any more she is [facility] she ask if we can call her rerills into new pharmacy they said patient does not have refills on Simvastatin 40 mg she does have refills on Losartan 100 mg, call doctor's office left a message for doctor to send in a prescription to [facility]. Mrs. Durrett is showing past due under [place].  [facility] Direct Dial [phone]  Fax [phone] [email or web address] '

## 2018-01-18 NOTE — Telephone Encounter (Signed)
Rx refilled per protocol.   PHX:10/567- with f/u due in 03/2018

## 2018-01-18 NOTE — Telephone Encounter (Signed)
Copied from [place] [id number]. Topic: Quick Communication - See Telephone Encounter >> Jan 18, 2018 11:54 AM Mylinda Latina, NT wrote: CRM for notification. See Telephone encounter for: 01/18/18. Ana calling from [facility] is requesting a refill of her simvastatin (ZOCOR) 40 MG tablet  [id number] [place], [place] [phone] (Phone) [phone] (Fax)

## 2018-01-22 ENCOUNTER — Ambulatory Visit (INDEPENDENT_AMBULATORY_CARE_PROVIDER_SITE_OTHER): Payer: Medicare Other | Admitting: Family Medicine

## 2018-01-22 ENCOUNTER — Encounter: Payer: Self-pay | Admitting: Family Medicine

## 2018-01-22 VITALS — BP 110/60 | HR 76 | Temp 98.2°F

## 2018-01-22 DIAGNOSIS — S80811D Abrasion, right lower leg, subsequent encounter: Secondary | ICD-10-CM | POA: Diagnosis not present

## 2018-01-22 NOTE — Patient Instructions (Signed)
Elevate legs frequently  Remove the Tegaderm in 5 days and then reapply after cleaning with soap and water  Continue daily compression to leg.

## 2018-01-22 NOTE — Progress Notes (Signed)
Subjective:     Patient ID: Jordan [name], female   DOB: December 02, 1945, 72 y.o.   MRN: [id number]  HPI Patient her for follow-up right leg wound. No recent injury. History of slow healing. She has type 2 diabetes which has been well controlled. No history of peripheral vascular disease.  We applied compression wrap week ago and patient states wound is no better and no worse. No drainage.  Past Medical History:  Diagnosis Date  . Arthritis    OA  . Blood transfusion 1992   AFTER  SURGERY TO REMOVE BLADDER  . 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 COLONIC POLYPS 07/11/2010  . Diabetes mellitus    ORAL MED FOR DIABETES  . GERD (gastroesophageal reflux disease)    PAST HISTORY GERD--WATCHES DIET - NOT ON ANY MEDS FOR GERD  . HYPERLIPIDEMIA 11/29/2009  . HYPERTENSION 11/29/2009  . Incisional hernia    NO PAIN-NOT CAUSING ANY DISCOMFORT  . PONV (postoperative nausea and vomiting)   . Presence of urostomy [facility])    11-28-14 remains   Past Surgical History:  Procedure Laterality Date  . ABDOMINAL HYSTERECTOMY  1990   partial  . APPENDECTOMY    . CHOLECYSTECTOMY    . COLONOSCOPY WITH PROPOFOL N/A 12/07/2014   Procedure: COLONOSCOPY WITH PROPOFOL;  Surgeon: Gatha Mayer, MD;  [facility];  Service: Endoscopy;  Laterality: N/A;  . LEFT CARPAL TUNNEL RELEASE X 2    . LUMBAR SYMPATHETECTOMIES X 2    . NEPHROLITHOTOMY  10/01/2011   Procedure: NEPHROLITHOTOMY PERCUTANEOUS;  Surgeon: Franchot Gallo, MD;  [facility];  Service: Urology;  Laterality: Right;       . radical cystectomy  1992   neurogenic bladder sec CP  . REVISION UROSTOMY CUTANEOUS  1992  . RIGHT CARPAL TUNNEL RELEASE    . RT ANKLE FUSION  1975   SEVERAL RT ANKLE SURGERIES PRIOR TO THE FUSION  . 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 TONSILLECTOMY      reports that she has  never smoked. She has never used smokeless tobacco. She reports that she does not drink alcohol or use drugs. family history includes Bladder Cancer in her father; Colon cancer (age of onset: 60) in her father; Colon polyps in her sister; Emphysema in her father; Heart attack in her maternal grandfather; Heart disease in her father and maternal grandmother; Heart disease (age of onset: 68) in her mother. Allergies  Allergen Reactions  . Morphine Shortness Of Breath  . Onglyza [Saxagliptin] Other (See Comments)    Headache within 45 minutes of taking this.  . Baclofen     GI upset  . Lisinopril Cough  . Metoclopramide Hcl     Reglan, irritable  . Penicillins     " MAJOR CASE OF HIVES"     Review of Systems  Constitutional: Negative for chills and fever.       Objective:   Physical Exam  Constitutional: She appears well-developed and well-nourished.  Cardiovascular: Normal rate.  Skin:  Superficial wound right lower anterior leg. No surrounding erythema. No drainage. No necrosis. Dimensions remain may remains 0.5 x 1 cm. Good granulation tissue       Assessment:     Superficial right leg wound. No signs of secondary infection    Plan:     -Continue frequent elevation  and compression -We decided to try Tegaderm: remove in 5 days and clean with soap and water gently and then reapply. Reassess here within 3 weeks  [name] MD [facility] at [facility]

## 2018-01-29 ENCOUNTER — Other Ambulatory Visit: Payer: Self-pay | Admitting: *Deleted

## 2018-01-29 ENCOUNTER — Telehealth: Payer: Self-pay | Admitting: Family Medicine

## 2018-01-29 MED ORDER — METFORMIN HCL 500 MG PO TABS: ORAL_TABLET | ORAL | 1 refills | Status: DC

## 2018-01-29 NOTE — Telephone Encounter (Signed)
Okay to give to patient?

## 2018-01-29 NOTE — Telephone Encounter (Signed)
Tegaderm ready for pick up and patient is aware.

## 2018-01-29 NOTE — Telephone Encounter (Signed)
Yes

## 2018-01-29 NOTE — Telephone Encounter (Signed)
Copied from [place] [id number]. Topic: Inquiry >> Jan 29, 2018  8:59 AM Oliver Pila B wrote: Reason for CRM: pt called to ask pcp/nurse about getting more tegaderm for her leg, pt states she was suppose to be given some to take home; contact pt to advise

## 2018-02-05 ENCOUNTER — Encounter: Payer: Self-pay | Admitting: Family Medicine

## 2018-02-05 ENCOUNTER — Ambulatory Visit (INDEPENDENT_AMBULATORY_CARE_PROVIDER_SITE_OTHER): Payer: Medicare Other | Admitting: Family Medicine

## 2018-02-05 VITALS — BP 110/60 | HR 72 | Temp 97.8°F

## 2018-02-05 DIAGNOSIS — S81801D Unspecified open wound, right lower leg, subsequent encounter: Secondary | ICD-10-CM | POA: Diagnosis not present

## 2018-02-05 NOTE — Patient Instructions (Signed)
Consider new shingles vaccine (Shingrix) and check with insurance for coverage.  Keep using compression hose daily.

## 2018-02-05 NOTE — Progress Notes (Signed)
Subjective:     Patient ID: Jordan [name], female   DOB: [birth year], 73 y.o.   MRN: [id number]  HPI   Patient here for follow-up regarding leg wound. We applied some Tegaderm last visit and wound is essentially healed . She continues to wear her compression garments and is also then using Ace wrap as well. She has diabetes which has been well controlled. Her A1c has consistently been low 6 range.  Past Medical History:  Diagnosis Date  . Arthritis    OA  . Blood transfusion 1992   AFTER  SURGERY TO REMOVE BLADDER  . 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 COLONIC POLYPS 07/11/2010  . Diabetes mellitus    ORAL MED FOR DIABETES  . GERD (gastroesophageal reflux disease)    PAST HISTORY GERD--WATCHES DIET - NOT ON ANY MEDS FOR GERD  . HYPERLIPIDEMIA 11/29/2009  . HYPERTENSION 11/29/2009  . Incisional hernia    NO PAIN-NOT CAUSING ANY DISCOMFORT  . PONV (postoperative nausea and vomiting)   . Presence of urostomy [facility])    11-28-14 remains   Past Surgical History:  Procedure Laterality Date  . ABDOMINAL HYSTERECTOMY  1990   partial  . APPENDECTOMY    . CHOLECYSTECTOMY    . COLONOSCOPY WITH PROPOFOL N/A 12/07/2014   Procedure: COLONOSCOPY WITH PROPOFOL;  Surgeon: Gatha Mayer, MD;  [facility];  Service: Endoscopy;  Laterality: N/A;  . LEFT CARPAL TUNNEL RELEASE X 2    . LUMBAR SYMPATHETECTOMIES X 2    . NEPHROLITHOTOMY  10/01/2011   Procedure: NEPHROLITHOTOMY PERCUTANEOUS;  Surgeon: Franchot Gallo, MD;  [facility];  Service: Urology;  Laterality: Right;       . radical cystectomy  1992   neurogenic bladder sec CP  . REVISION UROSTOMY CUTANEOUS  1992  . RIGHT CARPAL TUNNEL RELEASE    . RT ANKLE FUSION  1975   SEVERAL RT ANKLE SURGERIES PRIOR TO THE FUSION  . 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 TONSILLECTOMY      reports  that she has never smoked. She has never used smokeless tobacco. She reports that she does not drink alcohol or use drugs. family history includes Bladder Cancer in her father; Colon cancer (age of onset: 51) in her father; Colon polyps in her sister; Emphysema in her father; Heart attack in her maternal grandfather; Heart disease in her father and maternal grandmother; Heart disease (age of onset: 60) in her mother. Allergies  Allergen Reactions  . Morphine Shortness Of Breath  . Onglyza [Saxagliptin] Other (See Comments)    Headache within 45 minutes of taking this.  . Baclofen     GI upset  . Lisinopril Cough  . Metoclopramide Hcl     Reglan, irritable  . Penicillins     " MAJOR CASE OF HIVES"     Review of Systems  Constitutional: Negative for chills and fever.       Objective:   Physical Exam  Constitutional: She appears well-developed and well-nourished.  Cardiovascular: Normal rate.  Pulmonary/Chest: Effort normal and breath sounds normal.  Skin:  Leg wound has healed over at this point. She has very small eschar. No erythema. No warmth. No drainage       Assessment:     Right lower leg wound which is improving and healed at this point  Plan:     -continue daily compression hose -Patient has scheduled follow-up in October for routine medical follow-up. Repeat A1c then. Will address her flu vaccine at that visit  [name] MD [facility] Primary Care at [facility]

## 2018-03-15 DIAGNOSIS — Z936 Other artificial openings of urinary tract status: Secondary | ICD-10-CM | POA: Diagnosis not present

## 2018-03-17 ENCOUNTER — Other Ambulatory Visit: Payer: Self-pay

## 2018-03-17 ENCOUNTER — Ambulatory Visit (INDEPENDENT_AMBULATORY_CARE_PROVIDER_SITE_OTHER): Payer: Medicare Other | Admitting: Family Medicine

## 2018-03-17 ENCOUNTER — Encounter: Payer: Self-pay | Admitting: Family Medicine

## 2018-03-17 VITALS — BP 118/82 | HR 67 | Temp 98.2°F

## 2018-03-17 DIAGNOSIS — Z794 Long term (current) use of insulin: Secondary | ICD-10-CM | POA: Diagnosis not present

## 2018-03-17 DIAGNOSIS — E118 Type 2 diabetes mellitus with unspecified complications: Secondary | ICD-10-CM | POA: Diagnosis not present

## 2018-03-17 LAB — POCT GLYCOSYLATED HEMOGLOBIN (HGB A1C): Hemoglobin A1C: 6.4 % — AB (ref 4.0–5.6)

## 2018-03-17 NOTE — Progress Notes (Signed)
Subjective:     Patient ID: Jordan [name], female   DOB: [birth year], 72 y.o.   MRN: [id number]  HPI Patient is here for follow-up type 2 diabetes.  Her chronic problems include history of cerebral palsy, hypertension, diabetes type 2, hyperlipidemia, history of total cystectomy.  She has some chronic lower extremity edema related to venous stasis.  She has support hose on regularly.  Blood sugars been stable.  No hypoglycemia.  Remains on metformin.  Continues to work part-time  Past Medical History:  Diagnosis Date  . Arthritis    OA  . Blood transfusion 1992   AFTER  SURGERY TO REMOVE BLADDER  . 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 COLONIC POLYPS 07/11/2010  . Diabetes mellitus    ORAL MED FOR DIABETES  . GERD (gastroesophageal reflux disease)    PAST HISTORY GERD--WATCHES DIET - NOT ON ANY MEDS FOR GERD  . HYPERLIPIDEMIA 11/29/2009  . HYPERTENSION 11/29/2009  . Incisional hernia    NO PAIN-NOT CAUSING ANY DISCOMFORT  . PONV (postoperative nausea and vomiting)   . Presence of urostomy [facility])    11-28-14 remains   Past Surgical History:  Procedure Laterality Date  . ABDOMINAL HYSTERECTOMY  1990   partial  . APPENDECTOMY    . CHOLECYSTECTOMY    . COLONOSCOPY WITH PROPOFOL N/A 12/07/2014   Procedure: COLONOSCOPY WITH PROPOFOL;  Surgeon: Gatha Mayer, MD;  [facility];  Service: Endoscopy;  Laterality: N/A;  . LEFT CARPAL TUNNEL RELEASE X 2    . LUMBAR SYMPATHETECTOMIES X 2    . NEPHROLITHOTOMY  10/01/2011   Procedure: NEPHROLITHOTOMY PERCUTANEOUS;  Surgeon: Franchot Gallo, MD;  [facility];  Service: Urology;  Laterality: Right;       . radical cystectomy  1992   neurogenic bladder sec CP  . REVISION UROSTOMY CUTANEOUS  1992  . RIGHT CARPAL TUNNEL RELEASE    . RT ANKLE FUSION  1975   SEVERAL RT ANKLE SURGERIES PRIOR TO THE FUSION  .  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 TONSILLECTOMY      reports that she has never smoked. She has never used smokeless tobacco. She reports that she does not drink alcohol or use drugs. family history includes Bladder Cancer in her father; Colon cancer (age of onset: 33) in her father; Colon polyps in her sister; Emphysema in her father; Heart attack in her maternal grandfather; Heart disease in her father and maternal grandmother; Heart disease (age of onset: 37) in her mother. Allergies  Allergen Reactions  . Morphine Shortness Of Breath  . Onglyza [Saxagliptin] Other (See Comments)    Headache within 45 minutes of taking this.  . Baclofen     GI upset  . Lisinopril Cough  . Metoclopramide Hcl     Reglan, irritable  . Penicillins     " MAJOR CASE OF HIVES"     Review of Systems  Constitutional: Negative for chills and fever.  Respiratory: Negative for shortness of breath.   Cardiovascular: Negative for chest pain.  Endocrine: Negative for polydipsia and polyuria.       Objective:   Physical Exam  Constitutional: She appears well-developed and well-nourished.  Cardiovascular: Normal rate and regular rhythm.  Pulmonary/Chest: Breath sounds normal.  Musculoskeletal:  Has support hose on bilaterally.  These were not removed.  Assessment:     Type 2 diabetes well-controlled with A1c 6.4%    Plan:     -Continue current medications.  We will plan on routine follow-up in 3 months.  Flu vaccine already given.  [name] MD [place] [facility] at [facility]

## 2018-04-12 ENCOUNTER — Other Ambulatory Visit: Payer: Self-pay | Admitting: Family Medicine

## 2018-04-12 ENCOUNTER — Encounter: Payer: Self-pay | Admitting: Family Medicine

## 2018-04-12 NOTE — Telephone Encounter (Unsigned)
Copied from [place]. Topic: Quick Communication - Rx Refill/Question >> Apr 12, 2018  3:34 PM Mcneil, Ja-Kwan wrote: Medication: furosemide (LASIX) 80 MG tablet  Has the patient contacted their pharmacy? no  Preferred Pharmacy (with phone number or street name): [id number] [place], [place] [phone] (Phone) [phone] (Fax)  Agent: Please be advised that RX refills may take up to 3 business days. We ask that you follow-up with your pharmacy.

## 2018-04-12 NOTE — Telephone Encounter (Signed)
Requested medication (s) are due for refill today: Yes  Requested medication (s) are on the active medication list: Yes  Last refill:  03/30/17  Future visit scheduled: Yes  Notes to clinic:  Expired Rx     Requested Prescriptions  Pending Prescriptions Disp Refills   furosemide (LASIX) 80 MG tablet 180 tablet 1     Cardiovascular:  Diuretics - Loop Passed - 04/12/2018  5:13 PM      Passed - K in normal range and within 360 days    Potassium  Date Value Ref Range Status  10/09/2017 4.2 3.5 - 5.1 mEq/L Final         Passed - Ca in normal range and within 360 days    Calcium  Date Value Ref Range Status  10/09/2017 9.8 8.4 - 10.5 mg/dL Final         Passed - Na in normal range and within 360 days    Sodium  Date Value Ref Range Status  10/09/2017 141 135 - 145 mEq/L Final         Passed - Cr in normal range and within 360 days    Creatinine, Ser  Date Value Ref Range Status  10/09/2017 1.18 0.40 - 1.20 mg/dL Final         Passed - Last BP in normal range    BP Readings from Last 1 Encounters:  03/17/18 118/82         Passed - Valid encounter within last 6 months    Recent Outpatient Visits          3 weeks ago Type 2 diabetes mellitus with complication, with long-term current use of insulin ([place])   Therapist, music at [employer], Alinda Sierras, MD   2 months ago Wound of right lower extremity, subsequent encounter   Therapist, music at [employer], Alinda Sierras, MD   2 months ago Abrasion of right lower extremity, subsequent encounter   Therapist, music at [employer], Alinda Sierras, MD   2 months ago Abrasion of right lower extremity, initial encounter   Therapist, music at [employer], Alinda Sierras, MD   3 months ago Herpes zoster without complication   Therapist, music at [employer], Alinda Sierras, MD      Future Appointments            In 2 months Burchette, Alinda Sierras, MD [place] at [place], [facility]

## 2018-04-13 ENCOUNTER — Other Ambulatory Visit: Payer: Self-pay

## 2018-04-13 MED ORDER — ACCU-CHEK FASTCLIX LANCETS MISC: 0 refills | Status: DC

## 2018-04-13 MED ORDER — FUROSEMIDE 80 MG PO TABS: ORAL_TABLET | ORAL | 1 refills | Status: DC

## 2018-04-26 DIAGNOSIS — N200 Calculus of kidney: Secondary | ICD-10-CM | POA: Diagnosis not present

## 2018-05-31 ENCOUNTER — Other Ambulatory Visit: Payer: Self-pay | Admitting: Family Medicine

## 2018-05-31 MED ORDER — GLUCOSE BLOOD VI STRP: ORAL_STRIP | 3 refills | Status: AC

## 2018-05-31 NOTE — Telephone Encounter (Signed)
Copied from [place] [id number]. Topic: Quick Communication - Rx Refill/Question >> May 31, 2018  9:26 AM Alanda Slim E wrote: Medication: ACCU-CHEK SMARTVIEW test strip   Has the patient contacted their pharmacy? Yes.  (pharmacy called to get authorization for refill)   Preferred Pharmacy (with phone number or street name): [id number] [place], [place] [phone] (Phone) [phone] (Fax)    Agent: Please be advised that RX refills may take up to 3 business days. We ask that you follow-up with your pharmacy.

## 2018-06-01 ENCOUNTER — Other Ambulatory Visit: Payer: Self-pay

## 2018-06-18 ENCOUNTER — Encounter: Payer: Self-pay | Admitting: Family Medicine

## 2018-06-18 ENCOUNTER — Other Ambulatory Visit: Payer: Self-pay

## 2018-06-18 ENCOUNTER — Ambulatory Visit (INDEPENDENT_AMBULATORY_CARE_PROVIDER_SITE_OTHER): Payer: Medicare Other | Admitting: Family Medicine

## 2018-06-18 VITALS — BP 118/68 | HR 67 | Temp 98.0°F

## 2018-06-18 DIAGNOSIS — I10 Essential (primary) hypertension: Secondary | ICD-10-CM | POA: Diagnosis not present

## 2018-06-18 DIAGNOSIS — E119 Type 2 diabetes mellitus without complications: Secondary | ICD-10-CM | POA: Diagnosis not present

## 2018-06-18 DIAGNOSIS — Z794 Long term (current) use of insulin: Secondary | ICD-10-CM | POA: Diagnosis not present

## 2018-06-18 DIAGNOSIS — M792 Neuralgia and neuritis, unspecified: Secondary | ICD-10-CM

## 2018-06-18 DIAGNOSIS — E118 Type 2 diabetes mellitus with unspecified complications: Secondary | ICD-10-CM | POA: Diagnosis not present

## 2018-06-18 LAB — POCT GLYCOSYLATED HEMOGLOBIN (HGB A1C): HEMOGLOBIN A1C: 6.3 % — AB (ref 4.0–5.6)

## 2018-06-18 MED ORDER — GABAPENTIN 100 MG PO CAPS: ORAL_CAPSULE | ORAL | 5 refills | Status: DC

## 2018-06-18 NOTE — Patient Instructions (Signed)
Start out the Gabapentin 100 mg at night. If no relief,may increase gradually up to three at night as needed.

## 2018-06-18 NOTE — Progress Notes (Signed)
Subjective:     Patient ID: Jordan [name], female   DOB: 10-Nov-1945, 73 y.o.   MRN: [id number]  HPI Patient seen for medical follow-up  Type 2 diabetes.  History of good control.  Remains on metformin.  Not monitoring blood sugars regularly.  Last A1c 6.4%.  She also has hyperlipidemia treated with simvastatin.  She will be due for follow-up lipids in April.  Hypertension treated with losartan.  Her blood pressure has been stable.  She has some chronic bilateral leg edema which is felt to be largely venous stasis related.  She has been able to scale her Lasix back to 1/day.  She uses support hose regularly.  No recent orthopnea.  Recent issue of sharp pain which is in her right ankle and leg region.  This is a fleeting type sharp shooting pain which is worse at night.  Her husband had some gabapentin she took one which seemed to help.  Past Medical History:  Diagnosis Date  . Arthritis    OA  . Blood transfusion 1992   AFTER  SURGERY TO REMOVE BLADDER  . 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 COLONIC POLYPS 07/11/2010  . Diabetes mellitus    ORAL MED FOR DIABETES  . GERD (gastroesophageal reflux disease)    PAST HISTORY GERD--WATCHES DIET - NOT ON ANY MEDS FOR GERD  . HYPERLIPIDEMIA 11/29/2009  . HYPERTENSION 11/29/2009  . Incisional hernia    NO PAIN-NOT CAUSING ANY DISCOMFORT  . PONV (postoperative nausea and vomiting)   . Presence of urostomy [facility])    11-28-14 remains   Past Surgical History:  Procedure Laterality Date  . ABDOMINAL HYSTERECTOMY  1990   partial  . APPENDECTOMY    . CHOLECYSTECTOMY    . COLONOSCOPY WITH PROPOFOL N/A 12/07/2014   Procedure: COLONOSCOPY WITH PROPOFOL;  Surgeon: Gatha Mayer, MD;  [facility];  Service: Endoscopy;  Laterality: N/A;  . LEFT CARPAL TUNNEL RELEASE X 2    . LUMBAR SYMPATHETECTOMIES X 2    .  NEPHROLITHOTOMY  10/01/2011   Procedure: NEPHROLITHOTOMY PERCUTANEOUS;  Surgeon: Franchot Gallo, MD;  [facility];  Service: Urology;  Laterality: Right;       . radical cystectomy  1992   neurogenic bladder sec CP  . REVISION UROSTOMY CUTANEOUS  1992  . RIGHT CARPAL TUNNEL RELEASE    . RT ANKLE FUSION  1975   SEVERAL RT ANKLE SURGERIES PRIOR TO THE FUSION  . 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 TONSILLECTOMY      reports that she has never smoked. She has never used smokeless tobacco. She reports that she does not drink alcohol or use drugs. family history includes Bladder Cancer in her father; Colon cancer (age of onset: 84) in her father; Colon polyps in her sister; Emphysema in her father; Heart attack in her maternal grandfather; Heart disease in her father and maternal grandmother; Heart disease (age of onset: 25) in her mother. Allergies  Allergen Reactions  . Morphine Shortness Of Breath  . Onglyza [Saxagliptin] Other (See Comments)    Headache within 45 minutes of taking this.  . Baclofen     GI upset  . Lisinopril Cough  . Metoclopramide Hcl     Reglan, irritable  . Penicillins     " MAJOR CASE OF HIVES"  Review of Systems  Constitutional: Negative for fatigue.  Eyes: Negative for visual disturbance.  Respiratory: Negative for cough, chest tightness, shortness of breath and wheezing.   Cardiovascular: Positive for leg swelling. Negative for chest pain and palpitations.  Endocrine: Negative for polydipsia and polyuria.  Neurological: Negative for dizziness, seizures, syncope, weakness, light-headedness and headaches.       Objective:   Physical Exam Constitutional:      Appearance: She is well-developed.  Eyes:     Pupils: Pupils are equal, round, and reactive to light.  Neck:     Musculoskeletal: Neck supple.     Thyroid: No thyromegaly.     Vascular: No JVD.  Cardiovascular:     Rate and Rhythm: Normal rate and regular rhythm.      Heart sounds: No gallop.   Pulmonary:     Effort: Pulmonary effort is normal. No respiratory distress.     Breath sounds: Normal breath sounds. No wheezing or rales.  Musculoskeletal:     Comments: She has support hose on bilaterally.  These were not removed today  Neurological:     Mental Status: She is alert.        Assessment:     #1 hypertension stable and at goal  #2 controlled type 2 diabetes.  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  Symptoms worse at night    Plan:     -We will plan 43-month follow-up and get full labs at that point including lipids and chemistries -Continue current dose of metformin -Recommend trial gabapentin 100 mg nightly and may titrate up toward 3 mg gradually as needed for nighttime neuropathic pain.  We reviewed potential side effects including peripheral edema to watch out for  [name] MD [place] [facility] at [facility]

## 2018-07-04 ENCOUNTER — Other Ambulatory Visit: Payer: Self-pay | Admitting: Family Medicine

## 2018-07-09 ENCOUNTER — Other Ambulatory Visit: Payer: Self-pay | Admitting: Family Medicine

## 2018-07-10 ENCOUNTER — Other Ambulatory Visit: Payer: Self-pay | Admitting: Family Medicine

## 2018-07-20 ENCOUNTER — Other Ambulatory Visit: Payer: Self-pay | Admitting: Family Medicine

## 2018-07-30 ENCOUNTER — Other Ambulatory Visit: Payer: Self-pay | Admitting: Family Medicine

## 2018-08-04 DIAGNOSIS — Z936 Other artificial openings of urinary tract status: Secondary | ICD-10-CM | POA: Diagnosis not present

## 2018-08-10 DIAGNOSIS — N200 Calculus of kidney: Secondary | ICD-10-CM | POA: Diagnosis not present

## 2018-08-26 ENCOUNTER — Other Ambulatory Visit: Payer: Self-pay

## 2018-08-26 ENCOUNTER — Encounter (INDEPENDENT_AMBULATORY_CARE_PROVIDER_SITE_OTHER): Payer: Self-pay

## 2018-09-06 ENCOUNTER — Ambulatory Visit (INDEPENDENT_AMBULATORY_CARE_PROVIDER_SITE_OTHER): Payer: Medicare Other | Admitting: Bariatrics

## 2018-09-06 ENCOUNTER — Encounter (INDEPENDENT_AMBULATORY_CARE_PROVIDER_SITE_OTHER): Payer: Self-pay

## 2018-09-17 ENCOUNTER — Other Ambulatory Visit: Payer: Self-pay

## 2018-09-17 ENCOUNTER — Ambulatory Visit (INDEPENDENT_AMBULATORY_CARE_PROVIDER_SITE_OTHER): Payer: Medicare Other | Admitting: Family Medicine

## 2018-09-17 DIAGNOSIS — Z87442 Personal history of urinary calculi: Secondary | ICD-10-CM | POA: Diagnosis not present

## 2018-09-17 DIAGNOSIS — E119 Type 2 diabetes mellitus without complications: Secondary | ICD-10-CM | POA: Diagnosis not present

## 2018-09-17 DIAGNOSIS — E785 Hyperlipidemia, unspecified: Secondary | ICD-10-CM

## 2018-09-17 DIAGNOSIS — I10 Essential (primary) hypertension: Secondary | ICD-10-CM | POA: Diagnosis not present

## 2018-09-17 NOTE — Progress Notes (Signed)
Patient ID: Jordan [name], female   DOB: September 08, 1945, 73 y.o.   MRN: [id number]  Virtual Visit via Video Note  I connected with Jordan [name] on 09/17/18 at  8:30 AM EDT by a video enabled telemedicine application and verified that I am speaking with the correct person using two identifiers.  Location patient: home Location provider:work or home office Persons participating in the virtual visit: patient, provider  I discussed the limitations of evaluation and management by telemedicine and the availability of in person appointments. The patient expressed understanding and agreed to proceed.   HPI: Medical follow-up.  Patient has multiple chronic problems including history of type 2 diabetes, dyslipidemia, hypertension, cerebral palsy, recurrent kidney stones.  She recently had 24-hour urine per urology and was placed on potassium citrate.  She has chronic bilateral leg edema which is stable.  She is on Lasix once daily.  No dyspnea.  Denies any recent fevers or chills.  Medications reviewed.  She is due for follow-up lipids but we are trying to avoid office visits currently because of COVID-19 crisis.  Her blood sugars been stable.  Recent fasting blood sugar 115-125 range.  Last A1c 6.3%.  Postprandial blood sugars consistently around 140 or less   ROS: See pertinent positives and negatives per HPI.  Past Medical History:  Diagnosis Date  . Arthritis    OA  . Blood transfusion 1992   AFTER  SURGERY TO REMOVE BLADDER  . 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 COLONIC POLYPS 07/11/2010  . Diabetes mellitus    ORAL MED FOR DIABETES  . GERD (gastroesophageal reflux disease)    PAST HISTORY GERD--WATCHES DIET - NOT ON ANY MEDS FOR GERD  . HYPERLIPIDEMIA 11/29/2009  . HYPERTENSION 11/29/2009  . Incisional hernia    NO PAIN-NOT CAUSING ANY DISCOMFORT  . PONV (postoperative  nausea and vomiting)   . Presence of urostomy [facility])    11-28-14 remains    Past Surgical History:  Procedure Laterality Date  . ABDOMINAL HYSTERECTOMY  1990   partial  . APPENDECTOMY    . CHOLECYSTECTOMY    . COLONOSCOPY WITH PROPOFOL N/A 12/07/2014   Procedure: COLONOSCOPY WITH PROPOFOL;  Surgeon: Gatha Mayer, MD;  [facility];  Service: Endoscopy;  Laterality: N/A;  . LEFT CARPAL TUNNEL RELEASE X 2    . LUMBAR SYMPATHETECTOMIES X 2    . NEPHROLITHOTOMY  10/01/2011   Procedure: NEPHROLITHOTOMY PERCUTANEOUS;  Surgeon: Franchot Gallo, MD;  [facility];  Service: Urology;  Laterality: Right;       . radical cystectomy  1992   neurogenic bladder sec CP  . REVISION UROSTOMY CUTANEOUS  1992  . RIGHT CARPAL TUNNEL RELEASE    . RT ANKLE FUSION  1975   SEVERAL RT ANKLE SURGERIES PRIOR TO THE FUSION  . 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 TONSILLECTOMY      Family History  Problem Relation Age of Onset  . Heart disease Mother 30       COPD  . Heart disease Father        COPD   . Emphysema Father   . Colon cancer Father 34       Died at 22  . Bladder Cancer Father   . Colon polyps Sister   . Heart disease Maternal Grandmother   . Heart attack  Maternal Grandfather   . Esophageal cancer Neg Hx   . Kidney disease Neg Hx     SOCIAL HX: Non-smoker.  Lives with husband.  No alcohol.   Current Outpatient Medications:  .  ACCU-CHEK FASTCLIX LANCETS MISC, USE TO CHECK BLOOD SUGAR ONCE DAILY, Disp: 102 each, Rfl: 0 .  acetaminophen (TYLENOL) 500 MG tablet, Take 500 mg by mouth every 6 (six) hours as needed for mild pain. Pain , Disp: , Rfl:  .  aspirin 81 MG tablet, Take 81 mg by mouth daily. , Disp: , Rfl:  .  b complex vitamins capsule, Take 1 capsule by mouth daily. , Disp: , Rfl:  .  cetirizine (ZYRTEC) 10 MG tablet, Take 1 tablet (10 mg total) by mouth daily as needed. allergies, Disp: 90 tablet, Rfl: 1 .  Cholecalciferol (VITAMIN D) 2000  UNITS CAPS, Take 2,000 Units by mouth See admin instructions. Takes only on days Monday through thursday, Disp: , Rfl:  .  furosemide (LASIX) 80 MG tablet, TAKE 1 TABLET(80 MG) BY MOUTH TWICE DAILY, Disp: 180 tablet, Rfl: 1 .  gabapentin (NEURONTIN) 100 MG capsule, TAKE ONE TO TWO CAPSULES BY MOUTH AT NIGHT AS NEEDED FOR NEUROPATHIC PAIN., Disp: 180 capsule, Rfl: 2 .  glucose blood (ACCU-CHEK SMARTVIEW) test strip, USE TO CHECK BLOOD GLUCOSE TWICE DAILY AS DIRECTED, Disp: 100 each, Rfl: 3 .  losartan (COZAAR) 100 MG tablet, TAKE 1 TABLET BY MOUTH EVERY DAY, Disp: 90 tablet, Rfl: 0 .  metFORMIN (GLUCOPHAGE) 500 MG tablet, TAKE 2 TABLETS BY MOUTH EVERY MORNING AND 2 TABLETS EVERY EVENING, Disp: 360 tablet, Rfl: 1 .  Multiple Vitamin (MULTIVITAMIN) capsule, Take 1 capsule by mouth daily. , Disp: , Rfl:  .  Omega-3 Fatty Acids (FISH OIL) 1200 MG CAPS, Take 1,200 mg by mouth 2 (two) times daily. , Disp: , Rfl:  .  polyethylene glycol powder (GLYCOLAX/MIRALAX) powder, Take 17 g by mouth daily. One capfull daily by mouth with liquid, Disp: , Rfl:  .  potassium chloride (KLOR-CON 10) 10 MEQ tablet, Take 1 tablet (10 mEq total) by mouth daily., Disp: 30 tablet, Rfl: 5 .  senna (SENOKOT) 8.6 MG tablet, Take 1 tablet by mouth daily., Disp: , Rfl:  .  simvastatin (ZOCOR) 40 MG tablet, TAKE 1 TABLET BY MOUTH EVERY DAY, Disp: 90 tablet, Rfl: 0 .  sodium chloride (OCEAN) 0.65 % nasal spray, Place 1-2 sprays into the nose as needed. Allergies , Disp: , Rfl:   EXAM:  VITALS per patient if applicable:  GENERAL: alert, oriented, appears well and in no acute distress  HEENT: atraumatic, conjunttiva clear, no obvious abnormalities on inspection of external nose and ears  NECK: normal movements of the head and neck  LUNGS: on inspection no signs of respiratory distress, breathing rate appears normal, no obvious gross SOB, gasping or wheezing  CV: no obvious cyanosis  MS: moves all visible extremities without  noticeable abnormality  PSYCH/NEURO: pleasant and cooperative, no obvious depression or anxiety, speech and thought processing grossly intact  ASSESSMENT AND PLAN:  Discussed the following assessment and plan:  #1 type 2 diabetes well controlled by home readings -Plan recheck A1c at 83-month follow-up  #2 hypertension.  This has been stable -Continue current medication regimen  #3 dyslipidemia.  Patient remains on simvastatin -We will plan lipids and hepatic panel at 28-month follow-up     I discussed the assessment and treatment plan with the patient. The patient was provided an opportunity to ask questions and  all were answered. The patient agreed with the plan and demonstrated an understanding of the instructions.   The patient was advised to call back or seek an in-person evaluation if the symptoms worsen or if the condition fails to improve as anticipated.   Carolann Littler, MD

## 2018-09-20 ENCOUNTER — Ambulatory Visit (INDEPENDENT_AMBULATORY_CARE_PROVIDER_SITE_OTHER): Payer: Self-pay | Admitting: Bariatrics

## 2018-09-26 ENCOUNTER — Other Ambulatory Visit: Payer: Self-pay | Admitting: Family Medicine

## 2018-09-27 ENCOUNTER — Telehealth: Payer: Self-pay | Admitting: Family Medicine

## 2018-09-27 MED ORDER — FUROSEMIDE 80 MG PO TABS: ORAL_TABLET | ORAL | 1 refills | Status: DC

## 2018-09-27 NOTE — Telephone Encounter (Signed)
Patient had called requesting refill of furosemide.  We will go ahead and refill for 6 months.

## 2018-10-12 ENCOUNTER — Other Ambulatory Visit: Payer: Self-pay | Admitting: Family Medicine

## 2018-10-12 ENCOUNTER — Other Ambulatory Visit: Payer: Self-pay

## 2018-10-12 NOTE — Patient Outreach (Signed)
[place] [facility]) Care Management  10/12/2018  Jordan [name] 02-17-1946 [id number]   Medication Adherence call to Jordan [name]  HIPPA Compliant Voice message left with a call back number. [name] is showing past due under [name].   [place] Management Direct Dial [phone]  Fax [phone] [email or web address]

## 2018-10-25 ENCOUNTER — Other Ambulatory Visit: Payer: Self-pay

## 2018-10-25 NOTE — Patient Outreach (Signed)
[place] [facility]) Care Management  10/25/2018  Jordan [name] Sep 05, 1945 [id number]   Medication Adherence call to [name] [facility] Identifiers Verify spoke with patient she is due on Simvastatin 40 mg,Losartan 100 mg and Metformin 500 mg patient explain she has switch pharmacy she was going to [facility] but now she Switch to [facility] she does not have refills and is waiting for doctor to call back with refills .[facility] have all of her medications ready for patient to pick up. Jordan [name] is showing past due under [place].    [place] Management Direct Dial [phone]  Fax [phone] [email or web address]

## 2018-12-10 DIAGNOSIS — Z936 Other artificial openings of urinary tract status: Secondary | ICD-10-CM | POA: Diagnosis not present

## 2018-12-20 ENCOUNTER — Ambulatory Visit (INDEPENDENT_AMBULATORY_CARE_PROVIDER_SITE_OTHER): Payer: Medicare Other | Admitting: Family Medicine

## 2018-12-20 ENCOUNTER — Other Ambulatory Visit: Payer: Self-pay

## 2018-12-20 DIAGNOSIS — E119 Type 2 diabetes mellitus without complications: Secondary | ICD-10-CM

## 2018-12-20 DIAGNOSIS — I10 Essential (primary) hypertension: Secondary | ICD-10-CM | POA: Diagnosis not present

## 2018-12-20 DIAGNOSIS — E785 Hyperlipidemia, unspecified: Secondary | ICD-10-CM

## 2018-12-20 MED ORDER — GABAPENTIN 100 MG PO CAPS: ORAL_CAPSULE | ORAL | 2 refills | Status: DC

## 2018-12-20 NOTE — Progress Notes (Signed)
Patient ID: Jordan [name], female   DOB: [birth year], 73 y.o.   MRN: [id number]  This visit type was conducted due to national recommendations for restrictions regarding the COVID-19 pandemic in an effort to limit this patient's exposure and mitigate transmission in our community.   Virtual Visit via Video Note  I connected with Jordan [name] on 12/20/18 at 10:00 AM EDT by a video enabled telemedicine application and verified that I am speaking with the correct person using two identifiers.  Location patient: home Location provider:work or home office Persons participating in the virtual visit: patient, provider  I discussed the limitations of evaluation and management by telemedicine and the availability of in person appointments. The patient expressed understanding and agreed to proceed.   HPI: Medical follow-up.  She has been working from home and staying fairly isolated.  She has hypertension, type 2 diabetes, history of cerebral palsy, hyperlipidemia, history of bilateral leg edema.  Currently controlled with furosemide 80 mg once daily.  She uses support stockings consistently  No recent complaints.  Compliant with medications.  Recent fasting blood sugars averaging around 130 with late afternoon blood sugars averaging around 100.  A1c has been consistently controlled.  Recent blood pressures around 409 systolic and 70 diastolic.  Overdue for labs  Past Medical History:  Diagnosis Date  . Arthritis    OA  . Blood transfusion 1992   AFTER  SURGERY TO REMOVE BLADDER  . 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 COLONIC POLYPS 07/11/2010  . Diabetes mellitus    ORAL MED FOR DIABETES  . GERD (gastroesophageal reflux disease)    PAST HISTORY GERD--WATCHES DIET - NOT ON ANY MEDS FOR GERD  . HYPERLIPIDEMIA 11/29/2009  . HYPERTENSION 11/29/2009  . Incisional hernia    NO  PAIN-NOT CAUSING ANY DISCOMFORT  . PONV (postoperative nausea and vomiting)   . Presence of urostomy [facility])    11-28-14 remains   Past Surgical History:  Procedure Laterality Date  . ABDOMINAL HYSTERECTOMY  1990   partial  . APPENDECTOMY    . CHOLECYSTECTOMY    . COLONOSCOPY WITH PROPOFOL N/A 12/07/2014   Procedure: COLONOSCOPY WITH PROPOFOL;  Surgeon: Gatha Mayer, MD;  Location: [facility];  Service: Endoscopy;  Laterality: N/A;  . LEFT CARPAL TUNNEL RELEASE X 2    . LUMBAR SYMPATHETECTOMIES X 2    . NEPHROLITHOTOMY  10/01/2011   Procedure: NEPHROLITHOTOMY PERCUTANEOUS;  Surgeon: Franchot Gallo, MD;  Location: [facility];  Service: Urology;  Laterality: Right;       . radical cystectomy  1992   neurogenic bladder sec CP  . REVISION UROSTOMY CUTANEOUS  1992  . RIGHT CARPAL TUNNEL RELEASE    . RT ANKLE FUSION  1975   SEVERAL RT ANKLE SURGERIES PRIOR TO THE FUSION  . 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 TONSILLECTOMY      reports that she has never smoked. She has never used smokeless tobacco. She reports that she does not drink alcohol or use drugs. family history includes Bladder Cancer in her father; Colon cancer (age of onset: 77) in her father; Colon polyps in her sister; Emphysema in her father; Heart attack in her maternal grandfather; Heart disease in her father and maternal grandmother; Heart disease (age of onset: 49) in her mother. Allergies  Allergen Reactions  . Morphine Shortness Of  Breath  . Onglyza [Saxagliptin] Other (See Comments)    Headache within 45 minutes of taking this.  . Baclofen     GI upset  . Lisinopril Cough  . Metoclopramide Hcl     Reglan, irritable  . Penicillins     " MAJOR CASE OF HIVES"      ROS: See pertinent positives and negatives per HPI.  Past Medical History:  Diagnosis Date  . Arthritis    OA  . Blood transfusion 1992   AFTER  SURGERY TO REMOVE BLADDER  . CEREBRAL PALSY 07/11/2010   PT CAN TRANSFER BED TO  CHAIR--CAN STAND BRIEFLY--BUT NOT ABLE TO WALK; RT LEG HYPEREXTENDS BACKWARD AND IS WEAK-HAS A POWER CHAIR  . COLONIC POLYPS 07/11/2010  . Diabetes mellitus    ORAL MED FOR DIABETES  . GERD (gastroesophageal reflux disease)    PAST HISTORY GERD--WATCHES DIET - NOT ON ANY MEDS FOR GERD  . HYPERLIPIDEMIA 11/29/2009  . HYPERTENSION 11/29/2009  . Incisional hernia    NO PAIN-NOT CAUSING ANY DISCOMFORT  . PONV (postoperative nausea and vomiting)   . Presence of urostomy [facility])    11-28-14 remains    Past Surgical History:  Procedure Laterality Date  . ABDOMINAL HYSTERECTOMY  1990   partial  . APPENDECTOMY    . CHOLECYSTECTOMY    . COLONOSCOPY WITH PROPOFOL N/A 12/07/2014   Procedure: COLONOSCOPY WITH PROPOFOL;  Surgeon: Gatha Mayer, MD;  [facility];  Service: Endoscopy;  Laterality: N/A;  . LEFT CARPAL TUNNEL RELEASE X 2    . LUMBAR SYMPATHETECTOMIES X 2    . NEPHROLITHOTOMY  10/01/2011   Procedure: NEPHROLITHOTOMY PERCUTANEOUS;  Surgeon: Franchot Gallo, MD;  [facility];  Service: Urology;  Laterality: Right;       . radical cystectomy  1992   neurogenic bladder sec CP  . REVISION UROSTOMY CUTANEOUS  1992  . RIGHT CARPAL TUNNEL RELEASE    . RT ANKLE FUSION  1975   SEVERAL RT ANKLE SURGERIES PRIOR TO THE FUSION  . 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 TONSILLECTOMY      Family History  Problem Relation Age of Onset  . Heart disease Mother 21       COPD  . Heart disease Father        COPD   . Emphysema Father   . Colon cancer Father 2       Died at 25  . Bladder Cancer Father   . Colon polyps Sister   . Heart disease Maternal Grandmother   . Heart attack Maternal Grandfather   . Esophageal cancer Neg Hx   . Kidney disease Neg Hx     SOCIAL HX: Non-smoker   Current Outpatient Medications:  .  ACCU-CHEK FASTCLIX LANCETS MISC, USE TO CHECK BLOOD SUGAR ONCE DAILY, Disp: 102 each, Rfl: 0 .  acetaminophen (TYLENOL) 500 MG tablet, Take  500 mg by mouth every 6 (six) hours as needed for mild pain. Pain , Disp: , Rfl:  .  aspirin 81 MG tablet, Take 81 mg by mouth daily. , Disp: , Rfl:  .  b complex vitamins capsule, Take 1 capsule by mouth daily. , Disp: , Rfl:  .  cetirizine (ZYRTEC) 10 MG tablet, Take 1 tablet (10 mg total) by mouth daily as needed. allergies, Disp: 90 tablet, Rfl: 1 .  Cholecalciferol (VITAMIN D) 2000 UNITS CAPS, Take 2,000 Units by mouth See admin instructions. Takes only on days Monday  through thursday, Disp: , Rfl:  .  furosemide (LASIX) 80 MG tablet, TAKE 1 TABLET(80 MG) BY MOUTH TWICE DAILY, Disp: 180 tablet, Rfl: 1 .  gabapentin (NEURONTIN) 100 MG capsule, TAKE ONE TO TWO CAPSULES BY MOUTH AT NIGHT AS NEEDED FOR NEUROPATHIC PAIN., Disp: 180 capsule, Rfl: 2 .  glucose blood (ACCU-CHEK SMARTVIEW) test strip, USE TO CHECK BLOOD GLUCOSE TWICE DAILY AS DIRECTED, Disp: 100 each, Rfl: 3 .  losartan (COZAAR) 100 MG tablet, TAKE 1 TABLET BY MOUTH EVERY DAY, Disp: 90 tablet, Rfl: 0 .  metFORMIN (GLUCOPHAGE) 500 MG tablet, TAKE 2 TABLETS BY MOUTH EVERY MORNING AND 2 TABLETS EVERY EVENING, Disp: 360 tablet, Rfl: 1 .  Multiple Vitamin (MULTIVITAMIN) capsule, Take 1 capsule by mouth daily. , Disp: , Rfl:  .  Omega-3 Fatty Acids (FISH OIL) 1200 MG CAPS, Take 1,200 mg by mouth 2 (two) times daily. , Disp: , Rfl:  .  polyethylene glycol powder (GLYCOLAX/MIRALAX) powder, Take 17 g by mouth daily. One capfull daily by mouth with liquid, Disp: , Rfl:  .  potassium chloride (KLOR-CON 10) 10 MEQ tablet, Take 1 tablet (10 mEq total) by mouth daily., Disp: 30 tablet, Rfl: 5 .  senna (SENOKOT) 8.6 MG tablet, Take 1 tablet by mouth daily., Disp: , Rfl:  .  simvastatin (ZOCOR) 40 MG tablet, TAKE 1 TABLET BY MOUTH EVERY DAY, Disp: 90 tablet, Rfl: 0 .  sodium chloride (OCEAN) 0.65 % nasal spray, Place 1-2 sprays into the nose as needed. Allergies , Disp: , Rfl:   EXAM:  VITALS per patient if applicable:  GENERAL: alert, oriented,  appears well and in no acute distress  HEENT: atraumatic, conjunttiva clear, no obvious abnormalities on inspection of external nose and ears  NECK: normal movements of the head and neck  LUNGS: on inspection no signs of respiratory distress, breathing rate appears normal, no obvious gross SOB, gasping or wheezing  CV: no obvious cyanosis  MS: moves all visible extremities without noticeable abnormality  PSYCH/NEURO: pleasant and cooperative, no obvious depression or anxiety, speech and thought processing grossly intact  ASSESSMENT AND PLAN:  Discussed the following assessment and plan:  Hyperlipidemia, unspecified hyperlipidemia type  Essential hypertension  Controlled type 2 diabetes mellitus without complication, without long-term current use of insulin ([place])   -Patient scheduled for future labs with lipids, A1c, hepatic panel, basic metabolic panel -We will plan routine follow-up in 6 months unless indicated sooner by need     I discussed the assessment and treatment plan with the patient. The patient was provided an opportunity to ask questions and all were answered. The patient agreed with the plan and demonstrated an understanding of the instructions.   The patient was advised to call back or seek an in-person evaluation if the symptoms worsen or if the condition fails to improve as anticipated.   Carolann Littler, MD

## 2018-12-22 ENCOUNTER — Other Ambulatory Visit: Payer: Self-pay

## 2018-12-22 ENCOUNTER — Other Ambulatory Visit (INDEPENDENT_AMBULATORY_CARE_PROVIDER_SITE_OTHER): Payer: Medicare Other

## 2018-12-22 DIAGNOSIS — E119 Type 2 diabetes mellitus without complications: Secondary | ICD-10-CM | POA: Diagnosis not present

## 2018-12-22 LAB — BASIC METABOLIC PANEL
BUN: 45 mg/dL — ABNORMAL HIGH (ref 6–23)
CO2: 19 mEq/L (ref 19–32)
Calcium: 9.1 mg/dL (ref 8.4–10.5)
Chloride: 108 mEq/L (ref 96–112)
Creatinine, Ser: 1.52 mg/dL — ABNORMAL HIGH (ref 0.40–1.20)
GFR: 33.38 mL/min — ABNORMAL LOW (ref >60.00)
Glucose, Bld: 149 mg/dL — ABNORMAL HIGH (ref 70–99)
Potassium: 4.2 mEq/L (ref 3.5–5.1)
Sodium: 141 mEq/L (ref 135–145)

## 2018-12-22 LAB — HEPATIC FUNCTION PANEL
ALT: 13 U/L (ref 0–35)
AST: 8 U/L (ref 0–37)
Albumin: 4 g/dL (ref 3.5–5.2)
Alkaline Phosphatase: 109 U/L (ref 39–117)
Bilirubin, Direct: 0.1 mg/dL (ref 0.0–0.3)
Total Bilirubin: 0.5 mg/dL (ref 0.2–1.2)
Total Protein: 7 g/dL (ref 6.0–8.3)

## 2018-12-22 LAB — LIPID PANEL
Cholesterol: 142 mg/dL (ref 0–200)
HDL: 45.6 mg/dL (ref >39.00)
LDL Cholesterol: 75 mg/dL (ref 0–99)
NonHDL: 96.65
Total CHOL/HDL Ratio: 3
Triglycerides: 106 mg/dL (ref 0.0–149.0)
VLDL: 21.2 mg/dL (ref 0.0–40.0)

## 2018-12-22 LAB — HEMOGLOBIN A1C: Hgb A1c MFr Bld: 6.8 % — ABNORMAL HIGH (ref 4.6–6.5)

## 2018-12-28 ENCOUNTER — Other Ambulatory Visit: Payer: Self-pay

## 2018-12-28 DIAGNOSIS — R7989 Other specified abnormal findings of blood chemistry: Secondary | ICD-10-CM

## 2019-01-05 DIAGNOSIS — Z936 Other artificial openings of urinary tract status: Secondary | ICD-10-CM | POA: Diagnosis not present

## 2019-01-11 ENCOUNTER — Other Ambulatory Visit: Payer: Self-pay

## 2019-01-11 ENCOUNTER — Other Ambulatory Visit (INDEPENDENT_AMBULATORY_CARE_PROVIDER_SITE_OTHER): Payer: Medicare Other

## 2019-01-11 DIAGNOSIS — R7989 Other specified abnormal findings of blood chemistry: Secondary | ICD-10-CM

## 2019-01-11 LAB — BASIC METABOLIC PANEL
BUN: 49 mg/dL — ABNORMAL HIGH (ref 6–23)
CO2: 18 mEq/L — ABNORMAL LOW (ref 19–32)
Calcium: 9.4 mg/dL (ref 8.4–10.5)
Chloride: 108 mEq/L (ref 96–112)
Creatinine, Ser: 1.53 mg/dL — ABNORMAL HIGH (ref 0.40–1.20)
GFR: 33.12 mL/min — ABNORMAL LOW (ref >60.00)
Glucose, Bld: 141 mg/dL — ABNORMAL HIGH (ref 70–99)
Potassium: 4 mEq/L (ref 3.5–5.1)
Sodium: 138 mEq/L (ref 135–145)

## 2019-01-17 DIAGNOSIS — R1084 Generalized abdominal pain: Secondary | ICD-10-CM | POA: Diagnosis not present

## 2019-01-17 DIAGNOSIS — Z87442 Personal history of urinary calculi: Secondary | ICD-10-CM | POA: Diagnosis not present

## 2019-01-17 DIAGNOSIS — B9620 Unspecified Escherichia coli [E. coli] as the cause of diseases classified elsewhere: Secondary | ICD-10-CM | POA: Diagnosis not present

## 2019-01-17 DIAGNOSIS — N390 Urinary tract infection, site not specified: Secondary | ICD-10-CM | POA: Diagnosis not present

## 2019-01-18 ENCOUNTER — Ambulatory Visit (INDEPENDENT_AMBULATORY_CARE_PROVIDER_SITE_OTHER): Payer: Medicare Other | Admitting: Family Medicine

## 2019-01-18 ENCOUNTER — Other Ambulatory Visit: Payer: Self-pay

## 2019-01-18 DIAGNOSIS — I10 Essential (primary) hypertension: Secondary | ICD-10-CM

## 2019-01-18 DIAGNOSIS — N289 Disorder of kidney and ureter, unspecified: Secondary | ICD-10-CM

## 2019-01-18 NOTE — Progress Notes (Signed)
Patient ID: Jordan [name], female   DOB: 1945/08/13, 73 y.o.   MRN: [id number]   This visit type was conducted due to national recommendations for restrictions regarding the COVID-19 pandemic in an effort to limit this patient's exposure and mitigate transmission in our community.   Virtual Visit via Video Note  I connected with Jordan [name] on 01/18/19 at  1:15 PM EDT by a video enabled telemedicine application and verified that I am speaking with the correct person using two identifiers.  Location patient: home Location provider:work or home office Persons participating in the virtual visit: patient, provider  I discussed the limitations of evaluation and management by telemedicine and the availability of in person appointments. The patient expressed understanding and agreed to proceed.   HPI: Patient had recent lab work.  This came back significant for creatinine of 1.52.  Her baseline is usually around 1.1.  She had been taking Lasix 80 mg twice daily for quite some time for chronic lower extremity edema.  She has been faithfully wearing support hose for the past couple years and edema has been controlled with that.  She got repeat basic metabolic panel and creatinine essentially unchanged.  At that point, we reduced her Lasix to 40 mg twice daily.  She is taken 1/2 tablet of 80 twice daily.  So far, she has not noticed any increase in edema and no dyspnea.  She went to urology yesterday.  Reportedly had renal ultrasound but does not have results back.  She has had prior history of pyelonephritis and also history of recurrent kidney stones.  Does not use any nonsteroidals.  She feels she is staying fairly well-hydrated.  Blood pressure yesterday at urologist 120/62  Chronic problems include type 2 diabetes, obesity, hypertension, history of cerebral palsy, hyperlipidemia.   ROS: See pertinent positives and negatives per HPI.  Past Medical History:  Diagnosis Date  . Arthritis    OA  .  Blood transfusion 1992   AFTER  SURGERY TO REMOVE BLADDER  . 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 COLONIC POLYPS 07/11/2010  . Diabetes mellitus    ORAL MED FOR DIABETES  . GERD (gastroesophageal reflux disease)    PAST HISTORY GERD--WATCHES DIET - NOT ON ANY MEDS FOR GERD  . HYPERLIPIDEMIA 11/29/2009  . HYPERTENSION 11/29/2009  . Incisional hernia    NO PAIN-NOT CAUSING ANY DISCOMFORT  . PONV (postoperative nausea and vomiting)   . Presence of urostomy [facility])    11-28-14 remains    Past Surgical History:  Procedure Laterality Date  . ABDOMINAL HYSTERECTOMY  1990   partial  . APPENDECTOMY    . CHOLECYSTECTOMY    . COLONOSCOPY WITH PROPOFOL N/A 12/07/2014   Procedure: COLONOSCOPY WITH PROPOFOL;  Surgeon: Gatha Mayer, MD;  [facility];  Service: Endoscopy;  Laterality: N/A;  . LEFT CARPAL TUNNEL RELEASE X 2    . LUMBAR SYMPATHETECTOMIES X 2    . NEPHROLITHOTOMY  10/01/2011   Procedure: NEPHROLITHOTOMY PERCUTANEOUS;  Surgeon: Franchot Gallo, MD;  [facility];  Service: Urology;  Laterality: Right;       . radical cystectomy  1992   neurogenic bladder sec CP  . REVISION UROSTOMY CUTANEOUS  1992  . RIGHT CARPAL TUNNEL RELEASE    . RT ANKLE FUSION  1975   SEVERAL RT ANKLE SURGERIES PRIOR TO THE FUSION  .  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 TONSILLECTOMY      Family History  Problem Relation Age of Onset  . Heart disease Mother 44       COPD  . Heart disease Father        COPD   . Emphysema Father   . Colon cancer Father 43       Died at 36  . Bladder Cancer Father   . Colon polyps Sister   . Heart disease Maternal Grandmother   . Heart attack Maternal Grandfather   . Esophageal cancer Neg Hx   . Kidney disease Neg Hx     SOCIAL HX: Non-smoker   Current Outpatient Medications:  .  ACCU-CHEK FASTCLIX LANCETS  MISC, USE TO CHECK BLOOD SUGAR ONCE DAILY, Disp: 102 each, Rfl: 0 .  acetaminophen (TYLENOL) 500 MG tablet, Take 500 mg by mouth every 6 (six) hours as needed for mild pain. Pain , Disp: , Rfl:  .  aspirin 81 MG tablet, Take 81 mg by mouth daily. , Disp: , Rfl:  .  b complex vitamins capsule, Take 1 capsule by mouth daily. , Disp: , Rfl:  .  cetirizine (ZYRTEC) 10 MG tablet, Take 1 tablet (10 mg total) by mouth daily as needed. allergies, Disp: 90 tablet, Rfl: 1 .  Cholecalciferol (VITAMIN D) 2000 UNITS CAPS, Take 2,000 Units by mouth See admin instructions. Takes only on days Monday through thursday, Disp: , Rfl:  .  furosemide (LASIX) 80 MG tablet, TAKE 1 TABLET(80 MG) BY MOUTH TWICE DAILY, Disp: 180 tablet, Rfl: 1 .  gabapentin (NEURONTIN) 100 MG capsule, TAKE ONE TO TWO CAPSULES BY MOUTH AT NIGHT AS NEEDED FOR NEUROPATHIC PAIN., Disp: 180 capsule, Rfl: 2 .  glucose blood (ACCU-CHEK SMARTVIEW) test strip, USE TO CHECK BLOOD GLUCOSE TWICE DAILY AS DIRECTED, Disp: 100 each, Rfl: 3 .  losartan (COZAAR) 100 MG tablet, TAKE 1 TABLET BY MOUTH EVERY DAY, Disp: 90 tablet, Rfl: 0 .  metFORMIN (GLUCOPHAGE) 500 MG tablet, TAKE 2 TABLETS BY MOUTH EVERY MORNING AND 2 TABLETS EVERY EVENING, Disp: 360 tablet, Rfl: 1 .  Multiple Vitamin (MULTIVITAMIN) capsule, Take 1 capsule by mouth daily. , Disp: , Rfl:  .  Omega-3 Fatty Acids (FISH OIL) 1200 MG CAPS, Take 1,200 mg by mouth 2 (two) times daily. , Disp: , Rfl:  .  polyethylene glycol powder (GLYCOLAX/MIRALAX) powder, Take 17 g by mouth daily. One capfull daily by mouth with liquid, Disp: , Rfl:  .  potassium chloride (KLOR-CON 10) 10 MEQ tablet, Take 1 tablet (10 mEq total) by mouth daily., Disp: 30 tablet, Rfl: 5 .  senna (SENOKOT) 8.6 MG tablet, Take 1 tablet by mouth daily., Disp: , Rfl:  .  simvastatin (ZOCOR) 40 MG tablet, TAKE 1 TABLET BY MOUTH EVERY DAY, Disp: 90 tablet, Rfl: 0 .  sodium chloride (OCEAN) 0.65 % nasal spray, Place 1-2 sprays into the nose  as needed. Allergies , Disp: , Rfl:   EXAM:  VITALS per patient if applicable:  GENERAL: alert, oriented, appears well and in no acute distress  HEENT: atraumatic, conjunttiva clear, no obvious abnormalities on inspection of external nose and ears  NECK: normal movements of the head and neck  LUNGS: on inspection no signs of respiratory distress, breathing rate appears normal, no obvious gross SOB, gasping or wheezing  CV: no obvious cyanosis  MS: moves all visible extremities without noticeable abnormality  PSYCH/NEURO: pleasant and cooperative, no obvious depression  or anxiety, speech and thought processing grossly intact  ASSESSMENT AND PLAN:  Discussed the following assessment and plan:  Acute renal insufficiency-question prerenal from high-dose Lasix.  We recently reduce her Lasix as above.  -We recommended future lab with basic metabolic panel in 2 weeks -If creatinine not improving at that point consider reduction in losartan dose -Strict avoidance of all non-steroidals -Follow-up results from her recent ultrasound per urology  Hypertension-stable by home readings.     I discussed the assessment and treatment plan with the patient. The patient was provided an opportunity to ask questions and all were answered. The patient agreed with the plan and demonstrated an understanding of the instructions.   The patient was advised to call back or seek an in-person evaluation if the symptoms worsen or if the condition fails to improve as anticipated.   Carolann Littler, MD

## 2019-01-24 ENCOUNTER — Other Ambulatory Visit: Payer: Self-pay

## 2019-01-24 NOTE — Patient Outreach (Signed)
[facility]) Care Management  01/24/2019  [name] 03-19-1946 [id number]   Medication Adherence call to Mrs. Jordan [name] Identifiers Verify spoke with patient she is past due on Losartan 100 mg,Simvastatin 40 mg and Metformin 500 mg patient explain she is still taking all three medication but will pick up on Wednesday because  she gets paid on Wednesday, she explain her husband takes the same medication patient use his medication until she pick up from the pharmacy and replace what she borrow.Mr. Hatchell is showing past due under [place].   [facility] Management Direct Dial [phone]  Fax [phone] [email or web address]

## 2019-01-25 ENCOUNTER — Other Ambulatory Visit: Payer: Self-pay | Admitting: Family Medicine

## 2019-01-25 DIAGNOSIS — N200 Calculus of kidney: Secondary | ICD-10-CM | POA: Diagnosis not present

## 2019-01-26 ENCOUNTER — Ambulatory Visit (INDEPENDENT_AMBULATORY_CARE_PROVIDER_SITE_OTHER): Payer: Medicare Other | Admitting: Family Medicine

## 2019-01-26 ENCOUNTER — Other Ambulatory Visit: Payer: Self-pay

## 2019-01-26 ENCOUNTER — Encounter (INDEPENDENT_AMBULATORY_CARE_PROVIDER_SITE_OTHER): Payer: Self-pay | Admitting: Family Medicine

## 2019-01-26 VITALS — BP 117/70 | HR 87 | Temp 98.0°F | Ht 67.0 in | Wt 212.0 lb

## 2019-01-26 DIAGNOSIS — E669 Obesity, unspecified: Secondary | ICD-10-CM | POA: Diagnosis not present

## 2019-01-26 DIAGNOSIS — Z1331 Encounter for screening for depression: Secondary | ICD-10-CM

## 2019-01-26 DIAGNOSIS — I10 Essential (primary) hypertension: Secondary | ICD-10-CM

## 2019-01-26 DIAGNOSIS — Z0289 Encounter for other administrative examinations: Secondary | ICD-10-CM

## 2019-01-26 DIAGNOSIS — R0602 Shortness of breath: Secondary | ICD-10-CM

## 2019-01-26 DIAGNOSIS — R5383 Other fatigue: Secondary | ICD-10-CM | POA: Diagnosis not present

## 2019-01-26 DIAGNOSIS — Z6833 Body mass index (BMI) 33.0-33.9, adult: Secondary | ICD-10-CM | POA: Diagnosis not present

## 2019-01-26 DIAGNOSIS — E1165 Type 2 diabetes mellitus with hyperglycemia: Secondary | ICD-10-CM

## 2019-01-27 ENCOUNTER — Ambulatory Visit (INDEPENDENT_AMBULATORY_CARE_PROVIDER_SITE_OTHER): Payer: Medicare Other | Admitting: Psychology

## 2019-01-27 ENCOUNTER — Telehealth (INDEPENDENT_AMBULATORY_CARE_PROVIDER_SITE_OTHER): Payer: Self-pay | Admitting: Psychology

## 2019-01-27 DIAGNOSIS — F418 Other specified anxiety disorders: Secondary | ICD-10-CM

## 2019-01-27 LAB — CBC WITH DIFFERENTIAL
Basophils Absolute: 0.1 10*3/uL (ref 0.0–0.2)
Basos: 1 %
EOS (ABSOLUTE): 0.1 10*3/uL (ref 0.0–0.4)
Eos: 1 %
Hematocrit: 36.2 % (ref 34.0–46.6)
Hemoglobin: 11.2 g/dL (ref 11.1–15.9)
Immature Grans (Abs): 0 10*3/uL (ref 0.0–0.1)
Immature Granulocytes: 0 %
Lymphocytes Absolute: 2.8 10*3/uL (ref 0.7–3.1)
Lymphs: 30 %
MCH: 27.1 pg (ref 26.6–33.0)
MCHC: 30.9 g/dL — ABNORMAL LOW (ref 31.5–35.7)
MCV: 87 fL (ref 79–97)
Monocytes Absolute: 0.5 10*3/uL (ref 0.1–0.9)
Monocytes: 5 %
Neutrophils Absolute: 5.8 10*3/uL (ref 1.4–7.0)
Neutrophils: 63 %
RBC: 4.14 x10E6/uL (ref 3.77–5.28)
RDW: 14.5 % (ref 11.7–15.4)
WBC: 9.3 10*3/uL (ref 3.4–10.8)

## 2019-01-27 LAB — LIPID PANEL WITH LDL/HDL RATIO
Cholesterol, Total: 131 mg/dL (ref 100–199)
HDL: 37 mg/dL — ABNORMAL LOW (ref >39)
LDL Calculated: 60 mg/dL (ref 0–99)
LDl/HDL Ratio: 1.6 ratio (ref 0.0–3.2)
Triglycerides: 171 mg/dL — ABNORMAL HIGH (ref 0–149)
VLDL Cholesterol Cal: 34 mg/dL (ref 5–40)

## 2019-01-27 LAB — TSH: TSH: 1.13 u[IU]/mL (ref 0.450–4.500)

## 2019-01-27 LAB — T3: T3, Total: 90 ng/dL (ref 71–180)

## 2019-01-27 LAB — VITAMIN B12: Vitamin B-12: 231 pg/mL — ABNORMAL LOW (ref 232–1245)

## 2019-01-27 LAB — INSULIN, RANDOM: INSULIN: 23.5 u[IU]/mL (ref 2.6–24.9)

## 2019-01-27 LAB — T4, FREE: Free T4: 1.39 ng/dL (ref 0.82–1.77)

## 2019-01-27 NOTE — Progress Notes (Signed)
Office: [phone]  /  Fax: [phone]    Date: January 27, 2019    Appointment Start Time: 9:22am Duration: 34 minutes Provider: Glennie Isle, Psy.D. Type of Session: Intake for Individual Therapy  Location of Patient: Home Location of Provider: Provider's Home Type of Contact: Telepsychological Visit via [employer] WebEx  Informed Consent: This provider called Jordan [name] at 9:01am to assist with connecting to today's appointment. A HIPAA compliant voicemail was left requesting a call back. Jordan [name] reportedly called this provider's office at 9:13am stating she called a number and was waiting for this provider 10 minutes prior to today's appointment. This provider called Jordan [name] back and provided directions to connect to the [facility] appointment, which included re-sending the e-mail with the secure link for today's appointment. As such, today's appointment was initiated 22 minutes late. Prior to proceeding with today's appointment, two pieces of identifying information were obtained from Jordan [name] to verify identity. In addition, Jordan [name]'s physical location at the time of this appointment was obtained. Jordan [name] reported she was at home and provided the address. In the event of technical difficulties, Jordan [name] shared a phone number she could be reached at. Jordan [name] and this provider participated in today's telepsychological service. Also, Jordan [name] denied anyone else being present in the room or on the WebEx appointment.   The provider's role was explained to Jordan [name]. The provider reviewed and discussed issues of confidentiality, privacy, and limits therein (e.g., reporting obligations). In addition to verbal informed consent, written informed consent for psychological services was obtained from [place] [facility] prior to the initial intake interview. Written consent included information concerning the practice, financial arrangements, and confidentiality and patients' rights. Since the clinic is not a 24/7 crisis center, mental  health emergency resources were shared, and the provider explained MyChart, e-mail, voicemail, and/or other messaging systems should be utilized only for non-emergency reasons. This provider also explained that information obtained during appointments will be placed in [facility] record in a confidential manner and relevant information will be shared with other providers at [facility] that she meets with for coordination of care. Jordan [name] verbally acknowledged understanding of the aforementioned, and agreed to use mental health emergency resources discussed if needed. Moreover, Jordan [name] agreed information may be shared with other [facility] providers as needed for coordination of care. By signing the service agreement document, Jordan [name] provided written consent for coordination of care.   Prior to initiating telepsychological services, Jordan [name] was provided with an informed consent document, which included the development of a safety plan (i.e., an emergency contact and emergency resources) in the event of an emergency/crisis. Jordan [name] expressed understanding of the rationale of the safety plan and provided consent for this provider to reach out to her emergency contact in the event of an emergency/crisis. Jordan [name] returned the completed consent form prior to today's appointment. This provider verbally reviewed the consent form during today's appointment prior to proceeding with the appointment. Jordan [name] verbally acknowledged understanding that she is ultimately responsible for understanding her insurance benefits as it relates to reimbursement of telepsychological and in-person services. This provider also reviewed confidentiality, as it relates to telepsychological services, as well as the rationale for telepsychological services. More specifically, this provider's clinic is limiting in-person visits due to COVID-19. Therapeutic services will resume to in-person appointments once deemed  appropriate. Jordan [name] expressed understanding regarding the rationale for telepsychological services. In addition, this provider explained the telepsychological services informed consent document would be considered an addendum to the initial consent document/service agreement. Jordan [name] verbally consented  to proceed.   Chief Complaint/HPI: Jordan [name] was referred by Dr. Ilene Qua after her initial appointment with the clinic on January 27, 2019. During today's appointment, Jordan [name] was verbally administered a questionnaire assessing various behaviors related to emotional eating. Jordan [name] did not endorse any items. Jordan [name] shared she is "maintaining her weight" and wants to "maintain a better weight." She shared she enjoys pasta and she "used to like sweets." She denied a history of binge eating. Jordan [name] denied a history of restricting food intake, purging and engagement in other compensatory strategies, and has never been diagnosed with an eating disorder. She also denied a history of treatment for emotional eating. Moreover, Jordan [name] shared, "I usually eat 3 meals a day and I've been trying to eat healthier foods." She reported difficulty drinking enough water. Jordan [name] further reported that she engages in nighttime eating even when she is not physically hungry and noted she was previously eating chips and is now trying to eat celery or fruit. Furthermore, Jordan [name] denied other problems of concern.    Mental Status Examination:  Appearance: neat Behavior: cooperative Mood: euthymic after the appointment started; however, sounded irritable on the phone Affect: mood congruent Speech: normal in rate, volume, and tone Eye Contact: appropriate Psychomotor Activity: appropriate; shared she ambulates with a power wheelchair Thought Process: linear, logical, and goal directed  Content/Perceptual Disturbances: denies suicidal and homicidal ideation, plan, and intent and no hallucinations, delusions, bizarre thinking or  behavior reported or observed Orientation: time, person, place and purpose of appointment Cognition/Sensorium: memory, attention, language, and fund of knowledge intact  Insight: fair Judgment: fair  Family & Psychosocial History: Jordan [name] reported she is married and has one adult son. 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 Additionally, Jordan [name] shared her highest level of education obtained is a master's degree. Currently, Jordan [name] social support system consists of her husband, several friends, church affiliation, and son. Moreover, Jordan [name] stated she resides with her husband and her dog.  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   PT CAN TRANSFER BED TO CHAIR--CAN STAND BRIEFLY--BUT NOT ABLE TO WALK; RT LEG HYPEREXTENDS BACKWARD AND IS WEAK-HAS A POWER CHAIR   COLONIC POLYPS 07/11/2010   Constipation    Diabetes mellitus    ORAL MED FOR DIABETES   Gallbladder problem    GERD (gastroesophageal reflux disease)    PAST HISTORY GERD--WATCHES DIET - NOT ON ANY MEDS FOR GERD   HYPERLIPIDEMIA 11/29/2009   HYPERTENSION 11/29/2009   Incisional hernia    NO PAIN-NOT CAUSING ANY DISCOMFORT   Kidney infection    Osteoarthritis    Osteomyelitis (HCC)    PONV (postoperative nausea and vomiting)    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[place])    11-28-14 remains   Shortness of breath    Stroke (cerebrum) ([place])    Past Surgical History:  Procedure Laterality Date   [place]   partial   [place] WITH PROPOFOL N/A 12/07/2014   Procedure: COLONOSCOPY WITH PROPOFOL;  Surgeon: Ofilia Neas  Jordan Purl, Jordan [name];  Location: [facility];  Service: Endoscopy;  Laterality: N/A;   LEFT CARPAL TUNNEL RELEASE X 2     LUMBAR SYMPATHETECTOMIES X 2     MULTIPLE RIGHT ANKLE SURGERIES     NEPHROLITHOTOMY  10/01/2011   Procedure: NEPHROLITHOTOMY PERCUTANEOUS;  Surgeon: Franchot Gallo, Jordan [name];  Location: [facility];  Service: Urology;  Laterality: Right;        PARTIAL AMPUTATION OF LEFT GREAT TOE     1985   radical cystectomy  1992   neurogenic bladder sec CP   REPAIR OF NASAL FRACTURE  1985   REVISION UROSTOMY CUTANEOUS  1992   RIGHT CARPAL TUNNEL RELEASE     RIGHT HIP FLEXION CONTRACTURE REPAIR  1986   RT ANKLE FUSION  1975   SEVERAL RT ANKLE SURGERIES PRIOR TO THE FUSION   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--AT [place] EXTRACTION  1971   Current Outpatient Medications on File Prior to Visit  Medication Sig Dispense Refill   ACCU-CHEK FASTCLIX LANCETS MISC USE TO CHECK BLOOD SUGAR ONCE DAILY 102 each 0   acetaminophen (TYLENOL) 500 MG tablet Take 500 mg by mouth every 6 (six) hours as needed for mild pain. Pain      aspirin 81 MG tablet Take 81 mg by mouth daily.      b complex vitamins capsule Take 1 capsule by mouth daily.      cetirizine (ZYRTEC) 10 MG tablet Take 1 tablet (10 mg total) by mouth daily as needed. allergies 90 tablet 1   Cholecalciferol (VITAMIN D) 2000 UNITS CAPS Take 2,000 Units by mouth See admin instructions. Takes only on days Monday through thursday     ciprofloxacin (CIPRO) 250 MG tablet Take 250 mg by mouth 2 (two) times daily. 1 tablet per day for 10 days     furosemide (LASIX) 80 MG tablet TAKE 1 TABLET(80 MG) BY MOUTH TWICE DAILY 180 tablet 1   gabapentin (NEURONTIN) 100 MG capsule TAKE ONE TO TWO CAPSULES BY MOUTH AT NIGHT AS NEEDED FOR NEUROPATHIC PAIN. 180 capsule 2   glucose blood (ACCU-CHEK SMARTVIEW) test strip USE TO CHECK BLOOD GLUCOSE TWICE  DAILY AS DIRECTED 100 each 3   losartan (COZAAR) 100 MG tablet TAKE 1 TABLET BY MOUTH EVERY DAY 90 tablet 0   metFORMIN (GLUCOPHAGE) 500 MG tablet TAKE 2 TABLETS BY MOUTH EVERY MORNING AND 2 TABLETS BY MOUTH EVERY EVENING 360 tablet 1   Multiple Vitamin (MULTIVITAMIN) capsule Take 1 capsule by mouth daily.      Omega-3 Fatty Acids (FISH OIL) 1200 MG CAPS Take 1,200 mg by mouth 2 (two) times daily.      polyethylene glycol powder (GLYCOLAX/MIRALAX) powder Take 17 g by mouth daily. One capfull daily by mouth with liquid     potassium chloride (KLOR-CON 10) 10 MEQ tablet Take 1 tablet (10 mEq total) by mouth daily. (Patient taking differently: Take 10 mEq by mouth 3 (three) times daily. ) 30 tablet 5   senna (SENOKOT) 8.6 MG tablet Take 1 tablet by mouth daily.     simvastatin (ZOCOR) 40 MG tablet TAKE 1 TABLET BY MOUTH EVERY DAY 90 tablet 0   sodium chloride (OCEAN) 0.65 % nasal spray Place 1-2 sprays into the nose as needed. Allergies      No current facility-administered medications on file prior to visit.   Jordan [name]  denied a history of head injuries and loss of consciousness.   Mental Health History: Kassiah reported she attended individual therapy when going through her first divorce for a couple months in the 1970s. Sharicka denied a history of hospitalizations for psychiatric concerns, and has never met with a psychiatrist. Shereta has never been prescribed psychotropic medications. Domique denied a family history of mental health related concerns. Carlisa denied a trauma history, including psychological, physical  and sexual abuse, as well as neglect.   Simya described her typical mood as "even, stable." Aside from concerns noted above, Annaleigha reported "I would like to be lower in weight." She also discussed experiencing worry with her employment during the initial part of the pandemic. She further shared she had a "disconceting" visit yesterday with the clinic due to the process of weighing  contributing to anticipatory anxiety about future appointments. It was recommended she discuss the aforementioned further with Dr. Adair Patter; she agreed. Amara denied current alcohol use. She denied tobacco use. She denied illicit/recreational substance use. Regarding caffeine intake, Freya reported consuming a medium or large diet coke daily and 4-5 cups of hot and iced teas throughout the day. Furthermore, Jordan [name] denied experiencing the following: hopelessness, memory concerns, hallucinations and delusions, paranoia, mania, social withdrawal, crying spells and decreased motivation. She also denied history of and current suicidal ideation, plan, and intent; history of and current homicidal ideation, plan, and intent; and history of and current engagement in self-harm.  The following strengths were reported by Jordan [name]: organized, caring, and feel good. The following strengths were observed by this provider: ability to express thoughts and feelings during the therapeutic session, ability to establish and benefit from a therapeutic relationship, ability to learn and practice coping skills, willingness to work toward established goal(s) with the clinic and ability to engage in reciprocal conversation.  Legal History: Artie denied a history of legal involvement.   Structured Assessment Results: The Patient Health Questionnaire-9 (PHQ-9) is a self-report measure that assesses symptoms and severity of depression over the course of the last two weeks. Rissie obtained a score of 0. Little interest or pleasure in doing things 0  Feeling down, depressed, or hopeless 0  Trouble falling or staying asleep, or sleeping too much 0  Feeling tired or having little energy 0  Poor appetite or overeating 0  Feeling bad about yourself --- or that you are a failure or have let yourself or your family down 0  Trouble concentrating on things, such as reading the newspaper or watching television 0  Moving or speaking so slowly  that other people could have noticed? Or the opposite --- being so fidgety or restless that you have been moving around a lot more than usual 0  Thoughts that you would be better off dead or hurting yourself in some way 0  PHQ-9 Score 0    The Generalized Anxiety Disorder-7 (GAD-7) is a brief self-report measure that assesses symptoms of anxiety over the course of the last two weeks. Harriette obtained a score of 0. Feeling nervous, anxious, on edge 0  Not being able to stop or control worrying 0  Worrying too much about different things 0  Trouble relaxing 0  Being so restless that it's hard to sit still 0  Becoming easily annoyed or irritable 0  Feeling afraid as if something awful might happen 0  GAD-7 Score 0   Interventions: A chart review was conducted prior to the clinical intake interview. The PHQ-9, and GAD-7 were verbally administered  as well as a Mood and Food questionnaire to assess various behaviors related to emotional eating. Throughout session, empathic reflections and validation was provided. Psychoeducation regarding emotional versus physical hunger was provided. [place] was sent a handout via e-mail to increase awareness of hunger patterns and subsequent eating. Jordan [name] provided verbal consent during today's appointment for this provider to send the handout via e-mail.   Provisional DSM-5 Diagnosis: 300.09 (F41.8) Other Specified Anxiety Disorder, Emotional Eating Behaviors  Plan: Yadira declined future appointments. She acknowledged understanding that she may request a follow-up appointment with this provider in the future as long as she is still established with the clinic. No further follow-up planned by this provider.

## 2019-01-27 NOTE — Telephone Encounter (Addendum)
Entered in error

## 2019-02-02 NOTE — Progress Notes (Signed)
Office: [phone]  /  Fax: [phone]   HPI:   Chief Complaint: OBESITY  Jordan [name] (MR# [id number]) is a 73 y.o. female who presents on 01/26/2019 for obesity evaluation and treatment. Current BMI is Body mass index is 33.2 kg/m. Jordan [name] has struggled with obesity for years and has been unsuccessful in either losing weight or maintaining long term weight loss. Jordan [name] attended our information session and states she is currently in the action stage of change and ready to dedicate time achieving and maintaining a healthier weight.   Jordan [name] heard about our clinic from her hairdresser. For breakfast, she is doing fruit and an [place] muffin with butter on it. For lunch, she is doing salad or 1/2 sandwich-chicken, mayo, lettuce, 1/2 slice of cheese with iced tea water, or iced tea. Afternoon snack, she is doing chips or nuts. For dinner, she is doing meat (2.5 oz) and vegetables (1/2 cup), rice, or potatoes, or small baked potato.  Jordan [name] states her family eats meals together she thinks her family will eat healthier with  her her desired weight loss is 52 lbs she has significant food cravings issues  she is frequently drinking liquids with calories she frequently eats larger portions than normal  she struggles with emotional eating    Fatigue Jordan [name] feels her energy is lower than it should be. This has worsened with weight gain and has not worsened recently. Jordan [name] admits to daytime somnolence and  admits to waking up still tired. Patient is at risk for obstructive sleep apnea. Patent has a history of symptoms of daytime fatigue. Patient generally gets 6 hours of sleep per night, and states they generally have generally restful sleep. Snoring is present. Apneic episodes are not present. Epworth Sleepiness Score is 4.  Dyspnea on exertion Jordan [name] notes increasing shortness of breath with exercising and seems to be worsening over time with weight gain. She notes getting out of breath sooner  with activity than she used to. This has not gotten worse recently. Jordan [name] denies orthopnea.  Diabetes II with Hyperglycemia Jordan [name] has a diagnosis of diabetes type II. Jordan [name] 's last Hgb A1c was 6.8 on 12/22/2018 (this is elevated from 6.3). She is on metformin only. She was diagnosed around 2007/2008. She denies hypoglycemia. She has been working on intensive lifestyle modifications including diet, exercise, and weight loss to help control her blood glucose levels.  Hypertension Jordan [name] is a 73 y.o. female with hypertension. Jordan [name]'s blood pressure is controlled and she denies chest pain. She is on losartan (previously on lisinopril which was discontinued because of cough). She is working on weight loss to help control her blood pressure with the goal of decreasing her risk of heart attack and stroke.  Vitamin D Deficiency Jordan [name] has a diagnosis of vitamin D deficiency. She is currently taking OTC Vit D 2,000 IU daily. She notes fatigue and denies nausea, vomiting or muscle weakness.  Depression Screen Jordan [name]'s Food and Mood (modified PHQ-9) score was  Depression screen PHQ 2/9 01/26/2019  Decreased Interest 1  Down, Depressed, Hopeless 0  PHQ - 2 Score 1  Altered sleeping 0  Tired, decreased energy 1  Change in appetite 1  Feeling bad or failure about yourself  0  Trouble concentrating 0  Moving slowly or fidgety/restless 0  Suicidal thoughts 0  PHQ-9 Score 3  Difficult doing work/chores Not difficult at all    ASSESSMENT AND PLAN:  Other fatigue - Plan: EKG 12-Lead, CBC With Differential, Vitamin B12, T3, T4, free,  TSH  SOB (shortness of breath) on exertion - Plan: Lipid Panel With LDL/HDL Ratio  Type 2 diabetes mellitus with hyperglycemia, without long-term current use of insulin (HCC) - Plan: Insulin, random  Essential hypertension  Depression screening  Class 1 obesity with serious comorbidity and body mass index (BMI) of 33.0 to 33.9 in adult, unspecified obesity  type  PLAN:  Fatigue Jordan [name] was informed that her fatigue may be related to obesity, depression or many other causes. Labs will be ordered, and in the meanwhile Jordan [name] has agreed to work on diet, exercise and weight loss to help with fatigue. Proper sleep hygiene was discussed including the need for 7-8 hours of quality sleep each night. A sleep study was not ordered based on symptoms and Epworth score.  Dyspnea on exertion Jordan [name]'s shortness of breath appears to be obesity related and exercise induced. She has agreed to work on weight loss and gradually increase exercise to treat her exercise induced shortness of breath. If Wells follows our instructions and loses weight without improvement of her shortness of breath, we will plan to refer to pulmonology. We will monitor this condition regularly. Jordan [name] agrees to this plan.  Diabetes II with Hyperglycemia Jordan [name] has been given extensive diabetes education by myself today including ideal fasting and post-prandial blood glucose readings, individual ideal Hgb A1c goals and hypoglycemia prevention. We discussed the importance of good blood sugar control to decrease the likelihood of diabetic complications such as nephropathy, neuropathy, limb loss, blindness, coronary artery disease, and death. We discussed the importance of intensive lifestyle modification including diet, exercise and weight loss as the first line treatment for diabetes. Jordan [name] agrees to continue her diabetes medications, and we will check insulin level and B12 level today. Jordan [name] agrees to follow up with our clinic in 2 weeks.  Hypertension We discussed sodium restriction, working on healthy weight loss, and a regular exercise program as the means to achieve improved blood pressure control. Jordan [name] agreed with this plan and agreed to follow up as directed. We will continue to monitor her blood pressure as well as her progress with the above lifestyle modifications. She will continue  her medications as prescribed and will watch for signs of hypotension as she continues her lifestyle modifications. We will follow up on her blood pressure at her next appointment. EKG was done, and we will check CMP today. Jordan [name] agrees to follow up with our clinic in 2 weeks.  Vitamin D Deficiency Hinda was informed that low vitamin D levels contributes to fatigue and are associated with obesity, breast, and colon cancer. Jordan [name] agrees to continue OTC Vit D and will follow up for routine testing of vitamin D, at least 2-3 times per year. She was informed of the risk of over-replacement of vitamin D and agrees to not increase her dose unless she discusses this with [name] first. We will check Vit D level today. Jordan [name] agrees to follow up with our clinic in 2 weeks.  Depression Screen Jordan [name] had a negative depression screening. Depression is commonly associated with obesity and often results in emotional eating behaviors. We will monitor this closely and work on CBT to help improve the non-hunger eating patterns. Referral to Psychology may be required if no improvement is seen as she continues in our clinic.  Obesity Jordan [name] is currently in the action stage of change and her goal is to continue with weight loss efforts She has agreed to follow the Category 2 plan Jordan [name] has been instructed to work up to  a goal of 150 minutes of combined cardio and strengthening exercise per week for weight loss and overall health benefits. We discussed the following Behavioral Modification Strategies today: increasing lean protein intake, increasing vegetables and work on meal planning and easy cooking plans, keeping healthy foods in the home, and planning for success  Jordan [name] has agreed to follow up with our clinic in 2 weeks. She was informed of the importance of frequent follow up visits to maximize her success with intensive lifestyle modifications for her multiple health conditions. She was informed we would discuss  her lab results at her next visit unless there is a critical issue that needs to be addressed sooner. Jordan [name] agreed to keep her next visit at the agreed upon time to discuss these results.  ALLERGIES: Allergies  Allergen Reactions   Morphine Shortness Of Breath   Onglyza [Saxagliptin] Other (See Comments)    Headache within 45 minutes of taking this.   Baclofen     GI upset   Lisinopril Cough   Metoclopramide Hcl     Reglan, irritable   Penicillins     " MAJOR CASE OF HIVES"    MEDICATIONS: Current Outpatient Medications on File Prior to Visit  Medication Sig Dispense Refill   ACCU-CHEK FASTCLIX LANCETS MISC USE TO CHECK BLOOD SUGAR ONCE DAILY 102 each 0   acetaminophen (TYLENOL) 500 MG tablet Take 500 mg by mouth every 6 (six) hours as needed for mild pain. Pain      aspirin 81 MG tablet Take 81 mg by mouth daily.      b complex vitamins capsule Take 1 capsule by mouth daily.      cetirizine (ZYRTEC) 10 MG tablet Take 1 tablet (10 mg total) by mouth daily as needed. allergies 90 tablet 1   Cholecalciferol (VITAMIN D) 2000 UNITS CAPS Take 2,000 Units by mouth See admin instructions. Takes only on days Monday through thursday     ciprofloxacin (CIPRO) 250 MG tablet Take 250 mg by mouth 2 (two) times daily. 1 tablet per day for 10 days     furosemide (LASIX) 80 MG tablet TAKE 1 TABLET(80 MG) BY MOUTH TWICE DAILY 180 tablet 1   gabapentin (NEURONTIN) 100 MG capsule TAKE ONE TO TWO CAPSULES BY MOUTH AT NIGHT AS NEEDED FOR NEUROPATHIC PAIN. 180 capsule 2   glucose blood (ACCU-CHEK SMARTVIEW) test strip USE TO CHECK BLOOD GLUCOSE TWICE DAILY AS DIRECTED 100 each 3   losartan (COZAAR) 100 MG tablet TAKE 1 TABLET BY MOUTH EVERY DAY 90 tablet 0   metFORMIN (GLUCOPHAGE) 500 MG tablet TAKE 2 TABLETS BY MOUTH EVERY MORNING AND 2 TABLETS BY MOUTH EVERY EVENING 360 tablet 1   Multiple Vitamin (MULTIVITAMIN) capsule Take 1 capsule by mouth daily.      Omega-3 Fatty Acids (FISH  OIL) 1200 MG CAPS Take 1,200 mg by mouth 2 (two) times daily.      polyethylene glycol powder (GLYCOLAX/MIRALAX) powder Take 17 g by mouth daily. One capfull daily by mouth with liquid     potassium chloride (KLOR-CON 10) 10 MEQ tablet Take 1 tablet (10 mEq total) by mouth daily. (Patient taking differently: Take 10 mEq by mouth 3 (three) times daily. ) 30 tablet 5   simvastatin (ZOCOR) 40 MG tablet TAKE 1 TABLET BY MOUTH EVERY DAY 90 tablet 0   senna (SENOKOT) 8.6 MG tablet Take 1 tablet by mouth daily.     sodium chloride (OCEAN) 0.65 % nasal spray Place 1-2 sprays into the nose  as needed. Allergies      No current facility-administered medications on file prior to visit.     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   PT CAN TRANSFER BED TO CHAIR--CAN STAND BRIEFLY--BUT NOT ABLE TO WALK; RT LEG HYPEREXTENDS BACKWARD AND IS WEAK-HAS A POWER CHAIR   COLONIC POLYPS 07/11/2010   Constipation    Diabetes mellitus    ORAL MED FOR DIABETES   Gallbladder problem    GERD (gastroesophageal reflux disease)    PAST HISTORY GERD--WATCHES DIET - NOT ON ANY MEDS FOR GERD   HYPERLIPIDEMIA 11/29/2009   HYPERTENSION 11/29/2009   Incisional hernia    NO PAIN-NOT CAUSING ANY DISCOMFORT   Kidney infection    Osteoarthritis    Osteomyelitis (HCC)    PONV (postoperative nausea and vomiting)    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[place])    11-28-14 remains   Shortness of breath    Stroke (cerebrum) ([place])     PAST SURGICAL HISTORY: Past Surgical History:  Procedure Laterality Date   [place]   partial   [place]     COLONOSCOPY WITH PROPOFOL N/A 12/07/2014     Procedure: COLONOSCOPY WITH PROPOFOL;  Surgeon: Gatha Mayer, MD;  Location: [facility];  Service: Endoscopy;  Laterality: N/A;   LEFT CARPAL TUNNEL RELEASE X 2     LUMBAR SYMPATHETECTOMIES X 2     MULTIPLE RIGHT ANKLE SURGERIES     NEPHROLITHOTOMY  10/01/2011   Procedure: NEPHROLITHOTOMY PERCUTANEOUS;  Surgeon: Franchot Gallo, MD;  Location: [facility];  Service: Urology;  Laterality: Right;        PARTIAL AMPUTATION OF LEFT GREAT TOE     1985   radical cystectomy  1992   neurogenic bladder sec CP   REPAIR OF NASAL FRACTURE  1985   REVISION UROSTOMY CUTANEOUS  1992   RIGHT CARPAL TUNNEL RELEASE     RIGHT HIP FLEXION CONTRACTURE REPAIR  1986   RT ANKLE FUSION  1975   SEVERAL RT ANKLE SURGERIES PRIOR TO THE FUSION   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--AT [facility]      TONSILLECTOMY     TUBAL LIGATION  1975   VAGINAL HYSTERECTOMY  1990   WISDOM TOOTH EXTRACTION  1971    SOCIAL HISTORY: Social History   Tobacco Use   Smoking status: Never Smoker   Smokeless tobacco: Never Used  Substance Use Topics   Alcohol use: No    Alcohol/week: 0.0 standard drinks   Drug use: No    FAMILY HISTORY: Family History  Problem Relation Age of Onset   Heart disease Mother 52       COPD   Hypertension Mother    Hyperlipidemia Mother    Liver disease Mother    Alcoholism Mother    Heart disease Father        COPD    Emphysema Father    Colon cancer Father 83       Died at 29   Bladder Cancer Father    Diabetes Father    Hyperlipidemia Father  Cancer Father    Liver disease Father    Sleep apnea Father    Alcoholism Father    Colon polyps Sister    Heart disease Maternal Grandmother    Heart attack Maternal Grandfather    Esophageal cancer Neg Hx    Kidney disease Neg Hx     ROS: Review of Systems  Constitutional: Positive for malaise/fatigue. Negative for weight loss.  HENT: Positive for sinus pain (chronic).        +  Decreased hearing (wear 2 hearing aids) + Nasal stuffiness  Respiratory: Positive for shortness of breath (with exertion).   Cardiovascular: Negative for chest pain and orthopnea.       + Leg cramping (occasionally) + Very cold feet or hands  Gastrointestinal: Positive for constipation. Negative for nausea and vomiting.  Musculoskeletal: Positive for back pain.       Negative muscle weakness + Muscle or joint pain + Muscle stiffness  Skin:       + Hair or nail changes  Endo/Heme/Allergies:       Negative hypoglycemia    PHYSICAL EXAM: Blood pressure 117/70, pulse 87, temperature 98 F (36.7 C), temperature source Oral, height 5\' 7"  (1.702 m), weight 212 lb (96.2 kg), SpO2 97 %. Body mass index is 33.2 kg/m. Physical Exam Vitals signs reviewed.  Constitutional:      Appearance: Normal appearance. She is obese.  HENT:     Head: Normocephalic and atraumatic.     Nose: Nose normal.  Eyes:     General: No scleral icterus.    Extraocular Movements: Extraocular movements intact.  Neck:     Musculoskeletal: Normal range of motion and neck supple.     Comments: No thyromegaly present Cardiovascular:     Rate and Rhythm: Normal rate and regular rhythm.     Pulses: Normal pulses.     Heart sounds: Normal heart sounds.  Pulmonary:     Effort: Pulmonary effort is normal. No respiratory distress.     Breath sounds: Normal breath sounds.  Abdominal:     Palpations: Abdomen is soft.     Tenderness: There is no abdominal tenderness.     Comments: + Obesity  Musculoskeletal: Normal range of motion.     Right lower leg: No edema.     Left lower leg: No edema.  Skin:    General: Skin is warm and dry.  Neurological:     Mental Status: She is alert and oriented to person, place, and time.     Coordination: Coordination normal.  Psychiatric:        Mood and Affect: Mood normal.        Behavior: Behavior normal.     RECENT LABS AND TESTS: BMET    Component Value Date/Time   NA  138 01/11/2019 0922   K 4.0 01/11/2019 0922   CL 108 01/11/2019 0922   CO2 18 (L) 01/11/2019 0922   GLUCOSE 141 (H) 01/11/2019 0922   BUN 49 (H) 01/11/2019 0922   CREATININE 1.53 (H) 01/11/2019 0922   CALCIUM 9.4 01/11/2019 0922   GFRNONAA 37 (L) 01/08/2016 2138   GFRAA 43 (L) 01/08/2016 2138   Lab Results  Component Value Date   HGBA1C 6.8 (H) 12/22/2018   Lab Results  Component Value Date   INSULIN 23.5 01/26/2019   CBC    Component Value Date/Time   WBC 9.3 01/26/2019 1545   WBC 6.6 01/15/2016 1008   RBC 4.14 01/26/2019 1545   RBC 3.91  01/15/2016 1008   HGB 11.2 01/26/2019 1545   HCT 36.2 01/26/2019 1545   PLT 379.0 01/15/2016 1008   MCV 87 01/26/2019 1545   MCH 27.1 01/26/2019 1545   MCH 29.1 01/08/2016 2138   MCHC 30.9 (L) 01/26/2019 1545   MCHC 32.9 01/15/2016 1008   RDW 14.5 01/26/2019 1545   LYMPHSABS 2.8 01/26/2019 1545   MONOABS 0.5 01/15/2016 1008   EOSABS 0.1 01/26/2019 1545   BASOSABS 0.1 01/26/2019 1545   Iron/TIBC/Ferritin/ %Sat No results found for: IRON, TIBC, FERRITIN, IRONPCTSAT Lipid Panel     Component Value Date/Time   CHOL 131 01/26/2019 1545   TRIG 171 (H) 01/26/2019 1545   HDL 37 (L) 01/26/2019 1545   CHOLHDL 3 12/22/2018 0911   VLDL 21.2 12/22/2018 0911   LDLCALC 60 01/26/2019 1545   LDLDIRECT 75.0 01/09/2017 1448   Hepatic Function Panel     Component Value Date/Time   PROT 7.0 12/22/2018 0911   ALBUMIN 4.0 12/22/2018 0911   AST 8 12/22/2018 0911   ALT 13 12/22/2018 0911   ALKPHOS 109 12/22/2018 0911   BILITOT 0.5 12/22/2018 0911   BILIDIR 0.1 12/22/2018 0911      Component Value Date/Time   TSH 1.130 01/26/2019 1545   Vitamin D No recent labs  ECG  shows NSR with a rate of 84 BPM INDIRECT CALORIMETER done today shows a VO2 of 188 and a REE of 1308. Her calculated basal metabolic rate was unable to be obtained.       OBESITY BEHAVIORAL INTERVENTION VISIT  Today's visit was # 1   Starting weight: 212  lbs Starting date: 01/26/2019 Today's weight : 212 lbs Today's date: 01/26/2019 Total lbs lost to date: 0 At least 15 minutes were spent on discussing the following behavioral intervention visit.   ASK: We discussed the diagnosis of obesity with John Giovanni today and Kayleeann agreed to give [name] permission to discuss obesity behavioral modification therapy today.  ASSESS: Alaila has the diagnosis of obesity and her BMI today is 33.2 Tarita is in the action stage of change   ADVISE: Denisha was educated on the multiple health risks of obesity as well as the benefit of weight loss to improve her health. She was advised of the need for long term treatment and the importance of lifestyle modifications to improve her current health and to decrease her risk of future health problems.  AGREE: Multiple dietary modification options and treatment options were discussed and  Samariyah agreed to follow the recommendations documented in the above note.  ARRANGE: Starlyn was educated on the importance of frequent visits to treat obesity as outlined per CMS and USPSTF guidelines and agreed to schedule her next follow up appointment today.   I, Trixie Dredge, am acting as transcriptionist for Ilene Qua, MD    I have reviewed the above documentation for accuracy and completeness, and I agree with the above. - Ilene Qua, MD

## 2019-02-09 ENCOUNTER — Other Ambulatory Visit: Payer: Self-pay

## 2019-02-09 ENCOUNTER — Ambulatory Visit (INDEPENDENT_AMBULATORY_CARE_PROVIDER_SITE_OTHER): Payer: Medicare Other | Admitting: Family Medicine

## 2019-02-09 VITALS — BP 122/56 | HR 58 | Temp 98.0°F | Ht 67.0 in | Wt 214.0 lb

## 2019-02-09 DIAGNOSIS — E1165 Type 2 diabetes mellitus with hyperglycemia: Secondary | ICD-10-CM | POA: Diagnosis not present

## 2019-02-09 DIAGNOSIS — E538 Deficiency of other specified B group vitamins: Secondary | ICD-10-CM | POA: Diagnosis not present

## 2019-02-09 DIAGNOSIS — E669 Obesity, unspecified: Secondary | ICD-10-CM | POA: Diagnosis not present

## 2019-02-09 DIAGNOSIS — Z6833 Body mass index (BMI) 33.0-33.9, adult: Secondary | ICD-10-CM | POA: Diagnosis not present

## 2019-02-09 NOTE — Progress Notes (Signed)
Office: [phone]  /  Fax: [phone]   HPI:   Chief Complaint: OBESITY Jordan [name] is here to discuss her progress with her obesity treatment plan. She is on the Category 2 plan and is following her eating plan approximately 100% of the time. She states she is doing arm exercises 20 minutes 5 times per week. [name] felt surprised with the amount of bread on the meal plan. She weighed out her meat and measured her vegetables. She denies hunger or cravings. She used the snack list for snack suggestions.  Her weight is 214 lb (97.1 kg) today and has had a weight gain of 2 lbs since her last visit. She has lost 0 lbs since starting treatment with [name].  Diabetes II with hyperglycemia, not on insulin Jordan [name] has a diagnosis of diabetes type II and is on metformin only. Jordan [name] states fasting blood sugars range between 110 and 120. Last A1c was 6.8 on 12/22/2018. [name] voices she would like to be more aggressive with diabetes mellitus control.  Vitamin B12 deficiency Jordan [name] has a diagnosis of B12 insufficiency with a B12 level of 231 on 01/26/2019 and an MCV of 87. She states she has been on metformin for years.  ASSESSMENT AND PLAN:  Type 2 diabetes mellitus with hyperglycemia, without long-term current use of insulin (HCC)  B12 nutritional deficiency  Class 1 obesity with serious comorbidity and body mass index (BMI) of 33.0 to 33.9 in adult, unspecified obesity type  PLAN:  Diabetes II with hyperglycemia, not on insulin Jordan [name] has been given extensive diabetes education by myself today including ideal fasting and post-prandial blood glucose readings, individual ideal HgA1c goals  and hypoglycemia prevention. We discussed the importance of good blood sugar control to decrease the likelihood of diabetic complications such as nephropathy, neuropathy, limb loss, blindness, coronary artery disease, and death. We discussed the importance of intensive lifestyle modification including diet,  exercise and weight loss as the first line treatment for diabetes. [name] will have Hgb A1c repeated in 3 months. She will change her metformin dose as needed and will follow-up at the agreed upon time.  Vitamin B12 deficiency [name] will start OTC multivitamin. She will follow-up as directed to monitor her progress.  I spent > than 50% of the 25 minute visit on counseling as documented in the note.  Obesity [name] is currently in the action stage of change. As such, her goal is to continue with weight loss efforts. She has agreed to follow the Category 2 plan. [name] has been instructed to work up to a goal of 150 minutes of combined cardio and strengthening exercise per week for weight loss and overall health benefits. We discussed the following Behavioral Modification Strategies today: increasing lean protein intake, increasing vegetables, work on meal planning and easy cooking plans, keeping healthy foods in the home, and planning for success.  Jordan [name] has agreed to follow-up with our clinic in 2 weeks. She was informed of the importance of frequent follow-up visits to maximize her success with intensive lifestyle modifications for her multiple health conditions.  ALLERGIES: Allergies  Allergen Reactions  . Morphine Shortness Of Breath  . Onglyza [Saxagliptin] Other (See Comments)    Headache within 45 minutes of taking this.  . Baclofen     GI upset  . Lisinopril Cough  . Metoclopramide Hcl     Reglan, irritable  . Penicillins     " MAJOR CASE OF HIVES"    MEDICATIONS: Current Outpatient Medications on File Prior to Visit  Medication Sig Dispense Refill  . ACCU-CHEK FASTCLIX LANCETS MISC USE TO CHECK BLOOD SUGAR ONCE DAILY 102 each 0  . acetaminophen (TYLENOL) 500 MG tablet Take 500 mg by mouth every 6 (six) hours as needed for mild pain. Pain     . aspirin 81 MG tablet Take 81 mg by mouth daily.     Jordan [name] b complex vitamins capsule Take 1 capsule by mouth daily.     .  cetirizine (ZYRTEC) 10 MG tablet Take 1 tablet (10 mg total) by mouth daily as needed. allergies 90 tablet 1  . Cholecalciferol (VITAMIN D) 2000 UNITS CAPS Take 2,000 Units by mouth See admin instructions. Takes only on days Monday through thursday    . furosemide (LASIX) 80 MG tablet TAKE 1 TABLET(80 MG) BY MOUTH TWICE DAILY 180 tablet 1  . gabapentin (NEURONTIN) 100 MG capsule TAKE ONE TO TWO CAPSULES BY MOUTH AT NIGHT AS NEEDED FOR NEUROPATHIC PAIN. 180 capsule 2  . glucose blood (ACCU-CHEK SMARTVIEW) test strip USE TO CHECK BLOOD GLUCOSE TWICE DAILY AS DIRECTED 100 each 3  . losartan (COZAAR) 100 MG tablet TAKE 1 TABLET BY MOUTH EVERY DAY 90 tablet 0  . metFORMIN (GLUCOPHAGE) 500 MG tablet TAKE 2 TABLETS BY MOUTH EVERY MORNING AND 2 TABLETS BY MOUTH EVERY EVENING 360 tablet 1  . Multiple Vitamin (MULTIVITAMIN) capsule Take 1 capsule by mouth daily.     . Omega-3 Fatty Acids (FISH OIL) 1200 MG CAPS Take 1,200 mg by mouth 2 (two) times daily.     . polyethylene glycol powder (GLYCOLAX/MIRALAX) powder Take 17 g by mouth daily. One capfull daily by mouth with liquid    . potassium chloride (KLOR-CON 10) 10 MEQ tablet Take 1 tablet (10 mEq total) by mouth daily. (Patient taking differently: Take 10 mEq by mouth 3 (three) times daily. ) 30 tablet 5  . senna (SENOKOT) 8.6 MG tablet Take 1 tablet by mouth daily.    . simvastatin (ZOCOR) 40 MG tablet TAKE 1 TABLET BY MOUTH EVERY DAY 90 tablet 0  . sodium chloride (OCEAN) 0.65 % nasal spray Place 1-2 sprays into the nose as needed. Allergies      No current facility-administered medications on file prior to visit.     PAST MEDICAL HISTORY: Past Medical History:  Diagnosis Date  . Arthritis    OA  . Back pain   . Bilateral swelling of feet   . Blood transfusion 1992   AFTER  SURGERY TO REMOVE BLADDER  . Cataract   . 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 COLONIC POLYPS 07/11/2010  . Constipation   . Diabetes mellitus    ORAL MED FOR DIABETES  . Gallbladder problem   . GERD (gastroesophageal reflux disease)    PAST HISTORY GERD--WATCHES DIET - NOT ON ANY MEDS FOR GERD  . HYPERLIPIDEMIA 11/29/2009  . HYPERTENSION 11/29/2009  . Incisional hernia    NO PAIN-NOT CAUSING ANY DISCOMFORT  . Kidney infection   . Osteoarthritis   . Osteomyelitis ([place])   . PONV (postoperative nausea and vomiting)   . 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 Shortness of breath   . Stroke (cerebrum) ([place])     PAST SURGICAL HISTORY: Past Surgical History:  Procedure Laterality Date  . 1992    . ABDOMINAL HYSTERECTOMY  1990   partial  .  [place]  . ADENOIDECTOMY  1950  . APPENDECTOMY    . CARPAL TUNNEL RELEASE  1983  . CHOLECYSTECTOMY    . COLONOSCOPY WITH PROPOFOL N/A 12/07/2014   Procedure: COLONOSCOPY WITH PROPOFOL;  Surgeon: Gatha Mayer, MD;  Location: [facility];  Service: Endoscopy;  Laterality: N/A;  . LEFT CARPAL TUNNEL RELEASE X 2    . LUMBAR SYMPATHETECTOMIES X 2    . MULTIPLE RIGHT ANKLE SURGERIES    . NEPHROLITHOTOMY  10/01/2011   Procedure: NEPHROLITHOTOMY PERCUTANEOUS;  Surgeon: Franchot Gallo, MD;  Location: [facility];  Service: Urology;  Laterality: Right;       . PARTIAL AMPUTATION OF LEFT GREAT TOE     1985  . radical cystectomy  1992   neurogenic bladder sec CP  . REPAIR OF NASAL FRACTURE  1985  . REVISION UROSTOMY CUTANEOUS  1992  . RIGHT CARPAL TUNNEL RELEASE    . RIGHT HIP FLEXION CONTRACTURE REPAIR  1986  . RT ANKLE FUSION  1975   SEVERAL RT ANKLE SURGERIES PRIOR TO THE FUSION  . 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 TONSILLECTOMY    . TUBAL LIGATION  1975  . VAGINAL HYSTERECTOMY  1990  . WISDOM TOOTH EXTRACTION  1971    SOCIAL HISTORY: Social History   Tobacco Use  . Smoking status: Never Smoker  . Smokeless tobacco: Never Used   Substance Use Topics  . Alcohol use: No    Alcohol/week: 0.0 standard drinks  . Drug use: No    FAMILY HISTORY: Family History  Problem Relation Age of Onset  . Heart disease Mother 80       COPD  . Hypertension Mother   . Hyperlipidemia Mother   . Liver disease Mother   . Alcoholism Mother   . Heart disease Father        COPD   . Emphysema Father   . Colon cancer Father 67       Died at 33  . Bladder Cancer Father   . Diabetes Father   . Hyperlipidemia Father   . Cancer Father   . Liver disease Father   . Sleep apnea Father   . Alcoholism Father   . Colon polyps Sister   . Heart disease Maternal Grandmother   . Heart attack Maternal Grandfather   . Esophageal cancer Neg Hx   . Kidney disease Neg Hx    ROS: ROS none noted.  PHYSICAL EXAM: Blood pressure (!) 122/56, pulse (!) 58, temperature 98 F (36.7 C), temperature source Oral, height 5\' 7"  (1.702 m), weight 214 lb (97.1 kg), SpO2 100 %. Body mass index is 33.52 kg/m. Physical Exam Vitals signs reviewed.  Constitutional:      Appearance: Normal appearance. She is obese.  Cardiovascular:     Rate and Rhythm: Normal rate.     Pulses: Normal pulses.  Pulmonary:     Effort: Pulmonary effort is normal.     Breath sounds: Normal breath sounds.  Musculoskeletal: Normal range of motion.  Skin:    General: Skin is warm and dry.  Neurological:     Mental Status: She is alert and oriented to person, place, and time.  Psychiatric:        Behavior: Behavior normal.   RECENT LABS AND TESTS: BMET    Component Value Date/Time   NA 138 01/11/2019 0922   K 4.0 01/11/2019 0922   CL 108 01/11/2019 0922   CO2  18 (L) 01/11/2019 0922   GLUCOSE 141 (H) 01/11/2019 0922   BUN 49 (H) 01/11/2019 0922   CREATININE 1.53 (H) 01/11/2019 0922   CALCIUM 9.4 01/11/2019 0922   GFRNONAA 37 (L) 01/08/2016 2138   GFRAA 43 (L) 01/08/2016 2138   Lab Results  Component Value Date   HGBA1C 6.8 (H) 12/22/2018   HGBA1C 6.3 (A)  06/18/2018   HGBA1C 6.4 (A) 03/17/2018   HGBA1C 6.3 10/09/2017   HGBA1C 6.6 07/10/2017   Lab Results  Component Value Date   INSULIN 23.5 01/26/2019   CBC    Component Value Date/Time   WBC 9.3 01/26/2019 1545   WBC 6.6 01/15/2016 1008   RBC 4.14 01/26/2019 1545   RBC 3.91 01/15/2016 1008   HGB 11.2 01/26/2019 1545   HCT 36.2 01/26/2019 1545   PLT 379.0 01/15/2016 1008   MCV 87 01/26/2019 1545   MCH 27.1 01/26/2019 1545   MCH 29.1 01/08/2016 2138   MCHC 30.9 (L) 01/26/2019 1545   MCHC 32.9 01/15/2016 1008   RDW 14.5 01/26/2019 1545   LYMPHSABS 2.8 01/26/2019 1545   MONOABS 0.5 01/15/2016 1008   EOSABS 0.1 01/26/2019 1545   BASOSABS 0.1 01/26/2019 1545   Iron/TIBC/Ferritin/ %Sat No results found for: IRON, TIBC, FERRITIN, IRONPCTSAT Lipid Panel     Component Value Date/Time   CHOL 131 01/26/2019 1545   TRIG 171 (H) 01/26/2019 1545   HDL 37 (L) 01/26/2019 1545   CHOLHDL 3 12/22/2018 0911   VLDL 21.2 12/22/2018 0911   LDLCALC 60 01/26/2019 1545   LDLDIRECT 75.0 01/09/2017 1448   Hepatic Function Panel     Component Value Date/Time   PROT 7.0 12/22/2018 0911   ALBUMIN 4.0 12/22/2018 0911   AST 8 12/22/2018 0911   ALT 13 12/22/2018 0911   ALKPHOS 109 12/22/2018 0911   BILITOT 0.5 12/22/2018 0911   BILIDIR 0.1 12/22/2018 0911      Component Value Date/Time   TSH 1.130 01/26/2019 1545   TSH 1.27 01/04/2016 1120   TSH 0.740 07/09/2014 0555   No results found for: Vitamin D, 25-Hydroxy  OBESITY BEHAVIORAL INTERVENTION VISIT  Today's visit was #2  Starting weight: 212 lbs Starting date: 01/26/2019 Today's weight: 214 lbs Today's date: 02/09/2019 Total lbs lost to date: 0    02/09/2019  Height 5\' 7"  (1.702 m)  Weight 214 lb (97.1 kg)  BMI (Calculated) 33.51  BLOOD PRESSURE - SYSTOLIC [id number]  BLOOD PRESSURE - DIASTOLIC 56  Waist Measurement  43.5 inches   ASK: We discussed the diagnosis of obesity with John Giovanni today and [name] agreed to give [name]  permission to discuss obesity behavioral modification therapy today.  ASSESS: Luceal has the diagnosis of obesity and her BMI today is 33.51. Aadrika is in the action stage of change.   ADVISE: Ayania was educated on the multiple health risks of obesity as well as the benefit of weight loss to improve her health. She was advised of the need for long term treatment and the importance of lifestyle modifications to improve her current health and to decrease her risk of future health problems.  AGREE: Multiple dietary modification options and treatment options were discussed and  Brent agreed to follow the recommendations documented in the above note.  ARRANGE: Sarea was educated on the importance of frequent visits to treat obesity as outlined per CMS and USPSTF guidelines and agreed to schedule her next follow up appointment today.  IMichaelene Song, am acting as Location manager for [employer]  Adair Patter, MD  I have reviewed the above documentation for accuracy and completeness, and I agree with the above. - Ilene Qua, MD

## 2019-02-24 ENCOUNTER — Ambulatory Visit (INDEPENDENT_AMBULATORY_CARE_PROVIDER_SITE_OTHER): Payer: Medicare Other | Admitting: Family Medicine

## 2019-02-24 ENCOUNTER — Encounter (INDEPENDENT_AMBULATORY_CARE_PROVIDER_SITE_OTHER): Payer: Self-pay | Admitting: Family Medicine

## 2019-02-24 ENCOUNTER — Other Ambulatory Visit: Payer: Self-pay

## 2019-02-24 ENCOUNTER — Encounter (INDEPENDENT_AMBULATORY_CARE_PROVIDER_SITE_OTHER): Payer: Self-pay

## 2019-02-24 DIAGNOSIS — E669 Obesity, unspecified: Secondary | ICD-10-CM

## 2019-02-24 DIAGNOSIS — E1165 Type 2 diabetes mellitus with hyperglycemia: Secondary | ICD-10-CM | POA: Diagnosis not present

## 2019-02-24 DIAGNOSIS — E559 Vitamin D deficiency, unspecified: Secondary | ICD-10-CM | POA: Diagnosis not present

## 2019-02-24 DIAGNOSIS — Z6833 Body mass index (BMI) 33.0-33.9, adult: Secondary | ICD-10-CM | POA: Diagnosis not present

## 2019-02-28 NOTE — Progress Notes (Signed)
Office: [phone]  /  Fax: [phone] TeleHealth Visit:  Jordan [name] has verbally consented to this TeleHealth visit today. The patient is located at home, the provider is located at the [employer] [facility] office. The participants in this visit include the listed provider and patient. The visit was conducted today via webex.  HPI:   Chief Complaint: OBESITY Jordan [name] is here to discuss her progress with her obesity treatment plan. She is on the Category 2 plan and is following her eating plan approximately 100 % of the time. She states she is doing arm exercises and leg exercises in wheelchair for 15 minutes 6 times per week. [name] reports a weight of 180 lbs buts does not feel it was accurate. She is looking for different ways to prepare vegetable as she feels they are [employer]. She is looking for ways to incorporate former recipes she did to fit into the meal plan. She states her blood pressure was 120/76. We were unable to weigh the patient today for this TeleHealth visit. She feels as if she has lost weight since her last visit. She has lost 0 lbs since starting treatment with [name].  Diabetes II with Hyperglycemia [name] has a diagnosis of diabetes type II. [name] is on metformin 1,000 mg BID. She states her blood sugars are averaging at 130, no blood sugars below 115. She denies hypoglycemia. Last A1c was 6.8. She has been working on intensive lifestyle modifications including diet, exercise, and weight loss to help control her blood glucose levels.  Vitamin D Deficiency [name] has a diagnosis of vitamin D deficiency. She is currently taking OTC Vit D. She notes fatigue and denies nausea, vomiting or muscle weakness.  ASSESSMENT AND PLAN:  Type 2 diabetes mellitus with hyperglycemia, without long-term current use of insulin ([place])  PLAN:  Diabetes II with Hyperglycemia [name] has been given extensive diabetes education by myself today including ideal fasting and post-prandial  blood glucose readings, individual ideal Hgb A1c goals and hypoglycemia prevention. We discussed the importance of good blood sugar control to decrease the likelihood of diabetic complications such as nephropathy, neuropathy, limb loss, blindness, coronary artery disease, and death. We discussed the importance of intensive lifestyle modification including diet, exercise and weight loss as the first line treatment for diabetes. [name] agrees to continue taking metformin, no changes to medications doses. [name] agrees to follow up with our clinic in 2 weeks.  Vitamin D Deficiency [name] was informed that low vitamin D levels contributes to fatigue and are associated with obesity, breast, and colon cancer. [name] agrees to continue taking OTC Vit D replacement and will follow up for routine testing of vitamin D, at least 2-3 times per year. She was informed of the risk of over-replacement of vitamin D and agrees to not increase her dose unless she discusses this with [name] first. [name] agrees to follow up with our clinic in 2 weeks.  Obesity [name] is currently in the action stage of change. As such, her goal is to continue with weight loss efforts She has agreed to keep a food journal with 400-500 calories and 35+ grams of protein at supper daily and follow the Category 2 plan [name] has been instructed to work up to a goal of 150 minutes of combined cardio and strengthening exercise per week for weight loss and overall health benefits. We discussed the following Behavioral Modification Strategies today: increasing lean protein intake, increasing vegetables and work on meal planning and easy cooking plans, keeping healthy foods  in the home, and planning for success   [name] has agreed to follow up with our clinic in 2 weeks. She was informed of the importance of frequent follow up visits to maximize her success with intensive lifestyle modifications for her multiple health conditions.  ALLERGIES:  Allergies  Allergen Reactions  . Morphine Shortness Of Breath  . Onglyza [Saxagliptin] Other (See Comments)    Headache within 45 minutes of taking this.  . Baclofen     GI upset  . Lisinopril Cough  . Metoclopramide Hcl     Reglan, irritable  . Penicillins     " MAJOR CASE OF HIVES"    MEDICATIONS: Current Outpatient Medications on File Prior to Visit  Medication Sig Dispense Refill  . ACCU-CHEK FASTCLIX LANCETS MISC USE TO CHECK BLOOD SUGAR ONCE DAILY 102 each 0  . acetaminophen (TYLENOL) 500 MG tablet Take 500 mg by mouth every 6 (six) hours as needed for mild pain. Pain     . aspirin 81 MG tablet Take 81 mg by mouth daily.     Marland Kitchen b complex vitamins capsule Take 1 capsule by mouth daily.     . cetirizine (ZYRTEC) 10 MG tablet Take 1 tablet (10 mg total) by mouth daily as needed. allergies 90 tablet 1  . Cholecalciferol (VITAMIN D) 2000 UNITS CAPS Take 2,000 Units by mouth See admin instructions. Takes only on days Monday through thursday    . furosemide (LASIX) 80 MG tablet TAKE 1 TABLET(80 MG) BY MOUTH TWICE DAILY 180 tablet 1  . gabapentin (NEURONTIN) 100 MG capsule TAKE ONE TO TWO CAPSULES BY MOUTH AT NIGHT AS NEEDED FOR NEUROPATHIC PAIN. 180 capsule 2  . glucose blood (ACCU-CHEK SMARTVIEW) test strip USE TO CHECK BLOOD GLUCOSE TWICE DAILY AS DIRECTED 100 each 3  . losartan (COZAAR) 100 MG tablet TAKE 1 TABLET BY MOUTH EVERY DAY 90 tablet 0  . metFORMIN (GLUCOPHAGE) 500 MG tablet TAKE 2 TABLETS BY MOUTH EVERY MORNING AND 2 TABLETS BY MOUTH EVERY EVENING 360 tablet 1  . Multiple Vitamin (MULTIVITAMIN) capsule Take 1 capsule by mouth daily.     . Omega-3 Fatty Acids (FISH OIL) 1200 MG CAPS Take 1,200 mg by mouth 2 (two) times daily.     . polyethylene glycol powder (GLYCOLAX/MIRALAX) powder Take 17 g by mouth daily. One capfull daily by mouth with liquid    . potassium citrate (UROCIT-K) 10 MEQ (1080 MG) SR tablet Take 10 mEq by mouth 3 (three) times daily with meals.    . senna  (SENOKOT) 8.6 MG tablet Take 1 tablet by mouth daily.    . simvastatin (ZOCOR) 40 MG tablet TAKE 1 TABLET BY MOUTH EVERY DAY 90 tablet 0  . sodium chloride (OCEAN) 0.65 % nasal spray Place 1-2 sprays into the nose as needed. Allergies     . potassium chloride (KLOR-CON 10) 10 MEQ tablet Take 1 tablet (10 mEq total) by mouth daily. (Patient not taking: Reported on 02/24/2019) 30 tablet 5   No current facility-administered medications on file prior to visit.     PAST MEDICAL HISTORY: Past Medical History:  Diagnosis Date  . Arthritis    OA  . Back pain   . Bilateral swelling of feet   . Blood transfusion 1992   AFTER  SURGERY TO REMOVE BLADDER  . Cataract   . CEREBRAL PALSY 07/11/2010   PT CAN TRANSFER BED TO CHAIR--CAN STAND BRIEFLY--BUT NOT ABLE TO WALK; RT LEG HYPEREXTENDS BACKWARD AND IS WEAK-HAS A POWER  CHAIR  . COLONIC POLYPS 07/11/2010  . Constipation   . Diabetes mellitus    ORAL MED FOR DIABETES  . Gallbladder problem   . GERD (gastroesophageal reflux disease)    PAST HISTORY GERD--WATCHES DIET - NOT ON ANY MEDS FOR GERD  . HYPERLIPIDEMIA 11/29/2009  . HYPERTENSION 11/29/2009  . Incisional hernia    NO PAIN-NOT CAUSING ANY DISCOMFORT  . Kidney infection   . Osteoarthritis   . Osteomyelitis ([place])   . PONV (postoperative nausea and vomiting)   . 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[place])    11-28-14 remains  . Shortness of breath   . Stroke (cerebrum) ([place])     PAST SURGICAL HISTORY: Past Surgical History:  Procedure Laterality Date  . 1992    . ABDOMINAL HYSTERECTOMY  1990   partial  . ACHILLES TENDON LENGTHENING  1962  . ADENOIDECTOMY  1950  . APPENDECTOMY    . CARPAL TUNNEL RELEASE  1983  . CHOLECYSTECTOMY    . COLONOSCOPY WITH PROPOFOL N/A 12/07/2014   Procedure: COLONOSCOPY WITH PROPOFOL;  Surgeon: Gatha Mayer, MD;  Location: [facility];  Service: Endoscopy;  Laterality: N/A;  . LEFT CARPAL TUNNEL RELEASE X 2    . LUMBAR SYMPATHETECTOMIES X 2    . MULTIPLE RIGHT ANKLE  SURGERIES    . NEPHROLITHOTOMY  10/01/2011   Procedure: NEPHROLITHOTOMY PERCUTANEOUS;  Surgeon: Franchot Gallo, MD;  Location: [facility];  Service: Urology;  Laterality: Right;       . PARTIAL AMPUTATION OF LEFT GREAT TOE     1985  . radical cystectomy  1992   neurogenic bladder sec CP  . REPAIR OF NASAL FRACTURE  1985  . REVISION UROSTOMY CUTANEOUS  1992  . RIGHT CARPAL TUNNEL RELEASE    . RIGHT HIP FLEXION CONTRACTURE REPAIR  1986  . RT ANKLE FUSION  1975   SEVERAL RT ANKLE SURGERIES PRIOR TO THE FUSION  . 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 TONSILLECTOMY    . TUBAL LIGATION  1975  . VAGINAL HYSTERECTOMY  1990  . WISDOM TOOTH EXTRACTION  1971    SOCIAL HISTORY: Social History   Tobacco Use  . Smoking status: Never Smoker  . Smokeless tobacco: Never Used  Substance Use Topics  . Alcohol use: No    Alcohol/week: 0.0 standard drinks  . Drug use: No    FAMILY HISTORY: Family History  Problem Relation Age of Onset  . Heart disease Mother 58       COPD  . Hypertension Mother   . Hyperlipidemia Mother   . Liver disease Mother   . Alcoholism Mother   . Heart disease Father        COPD   . Emphysema Father   . Colon cancer Father 51       Died at 4  . Bladder Cancer Father   . Diabetes Father   . Hyperlipidemia Father   . Cancer Father   . Liver disease Father   . Sleep apnea Father   . Alcoholism Father   . Colon polyps Sister   . Heart disease Maternal Grandmother   . Heart attack Maternal Grandfather   . Esophageal cancer Neg Hx   . Kidney disease Neg Hx     ROS: Review of Systems  Constitutional: Positive for malaise/fatigue and weight loss.  Gastrointestinal: Negative for nausea and vomiting.  Musculoskeletal:       Negative muscle weakness  Endo/Heme/Allergies:  Negative hypoglycemia    PHYSICAL EXAM: Pt in no acute distress  RECENT LABS AND TESTS: BMET    Component Value Date/Time   NA 138 01/11/2019 0922   K 4.0  01/11/2019 0922   CL 108 01/11/2019 0922   CO2 18 (L) 01/11/2019 0922   GLUCOSE 141 (H) 01/11/2019 0922   BUN 49 (H) 01/11/2019 0922   CREATININE 1.53 (H) 01/11/2019 0922   CALCIUM 9.4 01/11/2019 0922   GFRNONAA 37 (L) 01/08/2016 2138   GFRAA 43 (L) 01/08/2016 2138   Lab Results  Component Value Date   HGBA1C 6.8 (H) 12/22/2018   HGBA1C 6.3 (A) 06/18/2018   HGBA1C 6.4 (A) 03/17/2018   HGBA1C 6.3 10/09/2017   HGBA1C 6.6 07/10/2017   Lab Results  Component Value Date   INSULIN 23.5 01/26/2019   CBC    Component Value Date/Time   WBC 9.3 01/26/2019 1545   WBC 6.6 01/15/2016 1008   RBC 4.14 01/26/2019 1545   RBC 3.91 01/15/2016 1008   HGB 11.2 01/26/2019 1545   HCT 36.2 01/26/2019 1545   PLT 379.0 01/15/2016 1008   MCV 87 01/26/2019 1545   MCH 27.1 01/26/2019 1545   MCH 29.1 01/08/2016 2138   MCHC 30.9 (L) 01/26/2019 1545   MCHC 32.9 01/15/2016 1008   RDW 14.5 01/26/2019 1545   LYMPHSABS 2.8 01/26/2019 1545   MONOABS 0.5 01/15/2016 1008   EOSABS 0.1 01/26/2019 1545   BASOSABS 0.1 01/26/2019 1545   Iron/TIBC/Ferritin/ %Sat No results found for: IRON, TIBC, FERRITIN, IRONPCTSAT Lipid Panel     Component Value Date/Time   CHOL 131 01/26/2019 1545   TRIG 171 (H) 01/26/2019 1545   HDL 37 (L) 01/26/2019 1545   CHOLHDL 3 12/22/2018 0911   VLDL 21.2 12/22/2018 0911   LDLCALC 60 01/26/2019 1545   LDLDIRECT 75.0 01/09/2017 1448   Hepatic Function Panel     Component Value Date/Time   PROT 7.0 12/22/2018 0911   ALBUMIN 4.0 12/22/2018 0911   AST 8 12/22/2018 0911   ALT 13 12/22/2018 0911   ALKPHOS 109 12/22/2018 0911   BILITOT 0.5 12/22/2018 0911   BILIDIR 0.1 12/22/2018 0911      Component Value Date/Time   TSH 1.130 01/26/2019 1545   TSH 1.27 01/04/2016 1120   TSH 0.740 07/09/2014 0555      I, Trixie Dredge, am acting as transcriptionist for Ilene Qua, MD   I have reviewed the above documentation for accuracy and completeness, and I agree with  the above. - Ilene Qua, MD

## 2019-03-10 ENCOUNTER — Encounter (INDEPENDENT_AMBULATORY_CARE_PROVIDER_SITE_OTHER): Payer: Self-pay | Admitting: Family Medicine

## 2019-03-10 ENCOUNTER — Telehealth (INDEPENDENT_AMBULATORY_CARE_PROVIDER_SITE_OTHER): Payer: Medicare Other | Admitting: Family Medicine

## 2019-03-10 ENCOUNTER — Other Ambulatory Visit: Payer: Self-pay

## 2019-03-10 DIAGNOSIS — E669 Obesity, unspecified: Secondary | ICD-10-CM

## 2019-03-10 DIAGNOSIS — I10 Essential (primary) hypertension: Secondary | ICD-10-CM | POA: Diagnosis not present

## 2019-03-10 DIAGNOSIS — Z6833 Body mass index (BMI) 33.0-33.9, adult: Secondary | ICD-10-CM

## 2019-03-10 DIAGNOSIS — E1165 Type 2 diabetes mellitus with hyperglycemia: Secondary | ICD-10-CM

## 2019-03-14 NOTE — Progress Notes (Signed)
Office: [phone]  /  Fax: [phone] TeleHealth Visit:  Jordan [name] has verbally consented to this TeleHealth visit today. The patient is located at home, the provider is located at the [employer] [facility] office. The participants in this visit include the listed provider and patient. Jordan [name] was unable to use realtime audiovisual technology today and the telehealth visit was conducted via telephone (14 minutes).   HPI:   Chief Complaint: OBESITY Jordan [name] is here to discuss her progress with her obesity treatment plan. She is on the keep a food journal with 400-500 calories and 35+ grams of protein at supper daily and follow the Category 2 plan and is following her eating plan approximately 100 % of the time. She states she is arm exercises for 30-45 minutes 6 times per week. Jordan [name] is following the plan as strictly as possible. She is unable to get a weight she believes to be accurate, but when weighing she gets 190 lbs. She denies hunger that is abnormal or cravings. She hasn't done any journaling.  We were unable to weigh the patient today for this TeleHealth visit. She feels as if she has lost weight since her last visit. She has lost 0 lbs since starting treatment with [name].  Diabetes II with Hyperglycemia Jordan [name] has a diagnosis of diabetes type II. Jordan [name] states her blood sugar average at 130 and she denies hypoglycemia. She denies GI side effects of metformin. Last A1c was 6.8. She has been working on intensive lifestyle modifications including diet, exercise, and weight loss to help control her blood glucose levels.  Hypertension Jordan [name] is a 73 y.o. female with hypertension. Jordan [name]'s blood pressure was well controlled at home, ranging between 110-120/70-80. She denies chest pain, chest pressure, or headaches. She is working on weight loss to help control her blood pressure with the goal of decreasing her risk of heart attack and stroke.   ASSESSMENT AND PLAN:  Type 2  diabetes mellitus with hyperglycemia, without long-term current use of insulin (H[facility])  Essential hypertension  Class 1 obesity with serious comorbidity and body mass index (BMI) of 33.0 to 33.9 in adult, unspecified obesity type  PLAN:  Diabetes II with Hyperglycemia Jordan [name] has been given extensive diabetes education by myself today including ideal fasting and post-prandial blood glucose readings, individual ideal Hgb A1c goals and hypoglycemia prevention. We discussed the importance of good blood sugar control to decrease the likelihood of diabetic complications such as nephropathy, neuropathy, limb loss, blindness, coronary artery disease, and death. We discussed the importance of intensive lifestyle modification including diet, exercise and weight loss as the first line treatment for diabetes. Jordan [name] agrees to continue taking metformin, no refill needed. Jordan [name] agrees to follow up with our clinic in 2 weeks.  Hypertension We discussed sodium restriction, working on healthy weight loss, and a regular exercise program as the means to achieve improved blood pressure control. Jordan [name] agreed with this plan and agreed to follow up as directed. We will continue to monitor her blood pressure as well as her progress with the above lifestyle modifications. Jordan [name] agrees to continue her current medications, no change in dose. She will watch for signs of hypotension as she continues her lifestyle modifications. Jordan [name] agrees to follow up with our clinic in 2 weeks.  Obesity Jordan [name] is currently in the action stage of change. As such, her goal is to continue with weight loss efforts She has agreed to follow the Category 2 plan Jordan [name] has been instructed to work up to  a goal of 150 minutes of combined cardio and strengthening exercise per week for weight loss and overall health benefits. We discussed the following Behavioral Modification Strategies today: increasing lean protein intake, increasing vegetables  and work on meal planning and easy cooking plans, keeping healthy foods in the home, and planning for success   Jordan [name] has agreed to follow up with our clinic in 2 weeks. She was informed of the importance of frequent follow up visits to maximize her success with intensive lifestyle modifications for her multiple health conditions.  ALLERGIES: Allergies  Allergen Reactions  . Morphine Shortness Of Breath  . Onglyza [Saxagliptin] Other (See Comments)    Headache within 45 minutes of taking this.  . Baclofen     GI upset  . Lisinopril Cough  . Metoclopramide Hcl     Reglan, irritable  . Penicillins     " MAJOR CASE OF HIVES"    MEDICATIONS: Current Outpatient Medications on File Prior to Visit  Medication Sig Dispense Refill  . ACCU-CHEK FASTCLIX LANCETS MISC USE TO CHECK BLOOD SUGAR ONCE DAILY 102 each 0  . acetaminophen (TYLENOL) 500 MG tablet Take 500 mg by mouth every 6 (six) hours as needed for mild pain. Pain     . aspirin 81 MG tablet Take 81 mg by mouth daily.     Marland Kitchen b complex vitamins capsule Take 1 capsule by mouth daily.     . cetirizine (ZYRTEC) 10 MG tablet Take 1 tablet (10 mg total) by mouth daily as needed. allergies 90 tablet 1  . Cholecalciferol (VITAMIN D) 2000 UNITS CAPS Take 2,000 Units by mouth See admin instructions. Takes only on days Monday through thursday    . furosemide (LASIX) 80 MG tablet TAKE 1 TABLET(80 MG) BY MOUTH TWICE DAILY 180 tablet 1  . gabapentin (NEURONTIN) 100 MG capsule TAKE ONE TO TWO CAPSULES BY MOUTH AT NIGHT AS NEEDED FOR NEUROPATHIC PAIN. 180 capsule 2  . glucose blood (ACCU-CHEK SMARTVIEW) test strip USE TO CHECK BLOOD GLUCOSE TWICE DAILY AS DIRECTED 100 each 3  . losartan (COZAAR) 100 MG tablet TAKE 1 TABLET BY MOUTH EVERY DAY 90 tablet 0  . metFORMIN (GLUCOPHAGE) 500 MG tablet TAKE 2 TABLETS BY MOUTH EVERY MORNING AND 2 TABLETS BY MOUTH EVERY EVENING 360 tablet 1  . Multiple Vitamin (MULTIVITAMIN) capsule Take 1 capsule by mouth  daily.     . Omega-3 Fatty Acids (FISH OIL) 1200 MG CAPS Take 1,200 mg by mouth 2 (two) times daily.     . polyethylene glycol powder (GLYCOLAX/MIRALAX) powder Take 17 g by mouth daily. One capfull daily by mouth with liquid    . potassium chloride (KLOR-CON 10) 10 MEQ tablet Take 1 tablet (10 mEq total) by mouth daily. 30 tablet 5  . potassium citrate (UROCIT-K) 10 MEQ (1080 MG) SR tablet Take 10 mEq by mouth 3 (three) times daily with meals.    . senna (SENOKOT) 8.6 MG tablet Take 1 tablet by mouth daily.    . simvastatin (ZOCOR) 40 MG tablet TAKE 1 TABLET BY MOUTH EVERY DAY 90 tablet 0  . sodium chloride (OCEAN) 0.65 % nasal spray Place 1-2 sprays into the nose as needed. Allergies      No current facility-administered medications on file prior to visit.     PAST MEDICAL HISTORY: Past Medical History:  Diagnosis Date  . Arthritis    OA  . Back pain   . Bilateral swelling of feet   . Blood transfusion 1992  AFTER  SURGERY TO REMOVE BLADDER  . Cataract   . 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 COLONIC POLYPS 07/11/2010  . Constipation   . Diabetes mellitus    ORAL MED FOR DIABETES  . Gallbladder problem   . GERD (gastroesophageal reflux disease)    PAST HISTORY GERD--WATCHES DIET - NOT ON ANY MEDS FOR GERD  . HYPERLIPIDEMIA 11/29/2009  . HYPERTENSION 11/29/2009  . Incisional hernia    NO PAIN-NOT CAUSING ANY DISCOMFORT  . Kidney infection   . Osteoarthritis   . Osteomyelitis ([place])   . PONV (postoperative nausea and vomiting)   . 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 Shortness of breath   . Stroke (cerebrum) ([place])     PAST SURGICAL HISTORY: Past Surgical History:  Procedure Laterality Date  . 1992    . ABDOMINAL HYSTERECTOMY  1990   partial  . ACHILLES TENDON LENGTHENING  1962  . ADENOIDECTOMY  1950  . APPENDECTOMY    . CARPAL TUNNEL  RELEASE  1983  . CHOLECYSTECTOMY    . COLONOSCOPY WITH PROPOFOL N/A 12/07/2014   Procedure: COLONOSCOPY WITH PROPOFOL;  Surgeon: Gatha Mayer, MD;  Location: [facility];  Service: Endoscopy;  Laterality: N/A;  . LEFT CARPAL TUNNEL RELEASE X 2    . LUMBAR SYMPATHETECTOMIES X 2    . MULTIPLE RIGHT ANKLE SURGERIES    . NEPHROLITHOTOMY  10/01/2011   Procedure: NEPHROLITHOTOMY PERCUTANEOUS;  Surgeon: Franchot Gallo, MD;  Location: [facility];  Service: Urology;  Laterality: Right;       . PARTIAL AMPUTATION OF LEFT GREAT TOE     1985  . radical cystectomy  1992   neurogenic bladder sec CP  . REPAIR OF NASAL FRACTURE  1985  . REVISION UROSTOMY CUTANEOUS  1992  . RIGHT CARPAL TUNNEL RELEASE    . RIGHT HIP FLEXION CONTRACTURE REPAIR  1986  . RT ANKLE FUSION  1975   SEVERAL RT ANKLE SURGERIES PRIOR TO THE FUSION  . 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 TONSILLECTOMY    . TUBAL LIGATION  1975  . VAGINAL HYSTERECTOMY  1990  . WISDOM TOOTH EXTRACTION  1971    SOCIAL HISTORY: Social History   Tobacco Use  . Smoking status: Never Smoker  . Smokeless tobacco: Never Used  Substance Use Topics  . Alcohol use: No    Alcohol/week: 0.0 standard drinks  . Drug use: No    FAMILY HISTORY: Family History  Problem Relation Age of Onset  . Heart disease Mother 79       COPD  . Hypertension Mother   . Hyperlipidemia Mother   . Liver disease Mother   . Alcoholism Mother   . Heart disease Father        COPD   . Emphysema Father   . Colon cancer Father 18       Died at 71  . Bladder Cancer Father   . Diabetes Father   . Hyperlipidemia Father   . Cancer Father   . Liver disease Father   . Sleep apnea Father   . Alcoholism Father   . Colon polyps Sister   . Heart disease Maternal Grandmother   . Heart attack Maternal Grandfather   . Esophageal cancer Neg Hx   . Kidney disease Neg Hx  ROS: Review of Systems  Constitutional: Positive for weight loss.   Cardiovascular: Negative for chest pain.       Negative chest pressure  Neurological: Negative for headaches.  Endo/Heme/Allergies:       Negative hypoglycemia    PHYSICAL EXAM: Pt in no acute distress  RECENT LABS AND TESTS: BMET    Component Value Date/Time   NA 138 01/11/2019 0922   K 4.0 01/11/2019 0922   CL 108 01/11/2019 0922   CO2 18 (L) 01/11/2019 0922   GLUCOSE 141 (H) 01/11/2019 0922   BUN 49 (H) 01/11/2019 0922   CREATININE 1.53 (H) 01/11/2019 0922   CALCIUM 9.4 01/11/2019 0922   GFRNONAA 37 (L) 01/08/2016 2138   GFRAA 43 (L) 01/08/2016 2138   Lab Results  Component Value Date   HGBA1C 6.8 (H) 12/22/2018   HGBA1C 6.3 (A) 06/18/2018   HGBA1C 6.4 (A) 03/17/2018   HGBA1C 6.3 10/09/2017   HGBA1C 6.6 07/10/2017   Lab Results  Component Value Date   INSULIN 23.5 01/26/2019   CBC    Component Value Date/Time   WBC 9.3 01/26/2019 1545   WBC 6.6 01/15/2016 1008   RBC 4.14 01/26/2019 1545   RBC 3.91 01/15/2016 1008   HGB 11.2 01/26/2019 1545   HCT 36.2 01/26/2019 1545   PLT 379.0 01/15/2016 1008   MCV 87 01/26/2019 1545   MCH 27.1 01/26/2019 1545   MCH 29.1 01/08/2016 2138   MCHC 30.9 (L) 01/26/2019 1545   MCHC 32.9 01/15/2016 1008   RDW 14.5 01/26/2019 1545   LYMPHSABS 2.8 01/26/2019 1545   MONOABS 0.5 01/15/2016 1008   EOSABS 0.1 01/26/2019 1545   BASOSABS 0.1 01/26/2019 1545   Iron/TIBC/Ferritin/ %Sat No results found for: IRON, TIBC, FERRITIN, IRONPCTSAT Lipid Panel     Component Value Date/Time   CHOL 131 01/26/2019 1545   TRIG 171 (H) 01/26/2019 1545   HDL 37 (L) 01/26/2019 1545   CHOLHDL 3 12/22/2018 0911   VLDL 21.2 12/22/2018 0911   LDLCALC 60 01/26/2019 1545   LDLDIRECT 75.0 01/09/2017 1448   Hepatic Function Panel     Component Value Date/Time   PROT 7.0 12/22/2018 0911   ALBUMIN 4.0 12/22/2018 0911   AST 8 12/22/2018 0911   ALT 13 12/22/2018 0911   ALKPHOS 109 12/22/2018 0911   BILITOT 0.5 12/22/2018 0911   BILIDIR 0.1  12/22/2018 0911      Component Value Date/Time   TSH 1.130 01/26/2019 1545   TSH 1.27 01/04/2016 1120   TSH 0.740 07/09/2014 0555      I, Trixie Dredge, am acting as transcriptionist for Ilene Qua, MD  I have reviewed the above documentation for accuracy and completeness, and I agree with the above. - Ilene Qua, MD

## 2019-03-24 ENCOUNTER — Ambulatory Visit (INDEPENDENT_AMBULATORY_CARE_PROVIDER_SITE_OTHER): Payer: Medicare Other | Admitting: Family Medicine

## 2019-03-24 ENCOUNTER — Encounter (INDEPENDENT_AMBULATORY_CARE_PROVIDER_SITE_OTHER): Payer: Self-pay | Admitting: Family Medicine

## 2019-03-24 ENCOUNTER — Other Ambulatory Visit: Payer: Self-pay

## 2019-03-24 VITALS — BP 128/74 | HR 76 | Temp 97.5°F | Ht 67.0 in | Wt 215.0 lb

## 2019-03-24 DIAGNOSIS — E1165 Type 2 diabetes mellitus with hyperglycemia: Secondary | ICD-10-CM

## 2019-03-24 DIAGNOSIS — E669 Obesity, unspecified: Secondary | ICD-10-CM | POA: Diagnosis not present

## 2019-03-24 DIAGNOSIS — Z6833 Body mass index (BMI) 33.0-33.9, adult: Secondary | ICD-10-CM

## 2019-03-24 DIAGNOSIS — E781 Pure hyperglyceridemia: Secondary | ICD-10-CM | POA: Diagnosis not present

## 2019-03-27 NOTE — Progress Notes (Signed)
Office: [phone]  /  Fax: [phone]   HPI:   Chief Complaint: OBESITY Jordan [name] is here to discuss her progress with her obesity treatment plan. She is on the Category 2 plan and is following her eating plan approximately 100 % of the time. She states she is doing arm exercises for 30 minutes 7 times per week. Jordan [name] voices the last few weeks have been good. She is eating all the food on the plan. Her clothes feel about the same. Her RMR at her initial appointment was 1308. She is getting in dull 8 oz at night and trying to get 4 oz at lunch. She is doing mostly cheese and crackers for snack.  Her weight is 215 lb (97.5 kg) today and has gained 1 lb since her last visit. She has lost 0 lbs since starting treatment with [name].  Diabetes II with Hyperglycemia Jordan [name] has a diagnosis of diabetes type II. Jordan [name] states her fasting BGs range in 130's and she denies hypoglycemia. She is taking metformin 1,000 mg BID. She denies a history of thyroid cancer. Last A1c was 6.8. She has been working on intensive lifestyle modifications including diet, exercise, and weight loss to help control her blood glucose levels.  Hypertriglyceridemia Jordan [name] has hypertriglyceridemia. She is not on statin and denies myalgias.  ASSESSMENT AND PLAN:  Type 2 diabetes mellitus with hyperglycemia, without long-term current use of insulin (HCC) - Plan: liraglutide (VICTOZA) 18 MG/3ML SOPN, Insulin Pen Needle 32G X 4 MM MISC  Hypertriglyceridemia  Class 1 obesity with serious comorbidity and body mass index (BMI) of 33.0 to 33.9 in adult, unspecified obesity type  PLAN:  Diabetes II with Hyperglycemia Jordan [name] has been given extensive diabetes education by myself today including ideal fasting and post-prandial blood glucose readings, individual ideal Hgb A1c goals and hypoglycemia prevention. We discussed the importance of good blood sugar control to decrease the likelihood of diabetic complications such as  nephropathy, neuropathy, limb loss, blindness, coronary artery disease, and death. We discussed the importance of intensive lifestyle modification including diet, exercise and weight loss as the first line treatment for diabetes. Jordan [name] agrees to continue her current medications; she agrees to start Victoza 0.6 mg SubQ daily #1 pen with no refill and pen needles #100 box with no refill. Jordan [name] agrees to follow up with our clinic in 2 weeks.  Hypertriglyceridemia We will repeat labs in early December. Jordan [name] agrees to follow up with our clinic in 2 weeks.  Obesity Jordan [name] is currently in the action stage of change. As such, her goal is to continue with weight loss efforts She has agreed to follow the Category 2 plan Jordan [name] has been instructed to work up to a goal of 150 minutes of combined cardio and strengthening exercise per week for weight loss and overall health benefits. We discussed the following Behavioral Modification Strategies today: increasing lean protein intake, increasing vegetables and work on meal planning and easy cooking plans, keeping healthy foods in the home, and planning for success   Jordan [name] has agreed to follow up with our clinic in 2 weeks. She was informed of the importance of frequent follow up visits to maximize her success with intensive lifestyle modifications for her multiple health conditions.  ALLERGIES: Allergies  Allergen Reactions  . Morphine Shortness Of Breath  . Onglyza [Saxagliptin] Other (See Comments)    Headache within 45 minutes of taking this.  . Baclofen     GI upset  . Lisinopril Cough  . Metoclopramide Hcl  Reglan, irritable  . Penicillins     " MAJOR CASE OF HIVES"    MEDICATIONS: Current Outpatient Medications on File Prior to Visit  Medication Sig Dispense Refill  . ACCU-CHEK FASTCLIX LANCETS MISC USE TO CHECK BLOOD SUGAR ONCE DAILY 102 each 0  . acetaminophen (TYLENOL) 500 MG tablet Take 500 mg by mouth every 6 (six) hours as  needed for mild pain. Pain     . aspirin 81 MG tablet Take 81 mg by mouth daily.     Marland Kitchen b complex vitamins capsule Take 1 capsule by mouth daily.     . cetirizine (ZYRTEC) 10 MG tablet Take 1 tablet (10 mg total) by mouth daily as needed. allergies 90 tablet 1  . Cholecalciferol (VITAMIN D) 2000 UNITS CAPS Take 2,000 Units by mouth See admin instructions. Takes only on days Monday through thursday    . furosemide (LASIX) 80 MG tablet TAKE 1 TABLET(80 MG) BY MOUTH TWICE DAILY 180 tablet 1  . gabapentin (NEURONTIN) 100 MG capsule TAKE ONE TO TWO CAPSULES BY MOUTH AT NIGHT AS NEEDED FOR NEUROPATHIC PAIN. 180 capsule 2  . glucose blood (ACCU-CHEK SMARTVIEW) test strip USE TO CHECK BLOOD GLUCOSE TWICE DAILY AS DIRECTED 100 each 3  . losartan (COZAAR) 100 MG tablet TAKE 1 TABLET BY MOUTH EVERY DAY 90 tablet 0  . metFORMIN (GLUCOPHAGE) 500 MG tablet TAKE 2 TABLETS BY MOUTH EVERY MORNING AND 2 TABLETS BY MOUTH EVERY EVENING 360 tablet 1  . Multiple Vitamin (MULTIVITAMIN) capsule Take 1 capsule by mouth daily.     . Omega-3 Fatty Acids (FISH OIL) 1200 MG CAPS Take 1,200 mg by mouth 2 (two) times daily.     . polyethylene glycol powder (GLYCOLAX/MIRALAX) powder Take 17 g by mouth daily. One capfull daily by mouth with liquid    . potassium chloride (KLOR-CON 10) 10 MEQ tablet Take 1 tablet (10 mEq total) by mouth daily. 30 tablet 5  . potassium citrate (UROCIT-K) 10 MEQ (1080 MG) SR tablet Take 10 mEq by mouth 3 (three) times daily with meals.    . senna (SENOKOT) 8.6 MG tablet Take 1 tablet by mouth daily.    . simvastatin (ZOCOR) 40 MG tablet TAKE 1 TABLET BY MOUTH EVERY DAY 90 tablet 0  . sodium chloride (OCEAN) 0.65 % nasal spray Place 1-2 sprays into the nose as needed. Allergies      No current facility-administered medications on file prior to visit.     PAST MEDICAL HISTORY: Past Medical History:  Diagnosis Date  . Arthritis    OA  . Back pain   . Bilateral swelling of feet   . Blood  transfusion 1992   AFTER  SURGERY TO REMOVE BLADDER  . Cataract   . 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 COLONIC POLYPS 07/11/2010  . Constipation   . Diabetes mellitus    ORAL MED FOR DIABETES  . Gallbladder problem   . GERD (gastroesophageal reflux disease)    PAST HISTORY GERD--WATCHES DIET - NOT ON ANY MEDS FOR GERD  . HYPERLIPIDEMIA 11/29/2009  . HYPERTENSION 11/29/2009  . Incisional hernia    NO PAIN-NOT CAUSING ANY DISCOMFORT  . Kidney infection   . Osteoarthritis   . Osteomyelitis ([place])   . PONV (postoperative nausea and vomiting)   . 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  Shortness of breath   . Stroke (cerebrum) ([place])     PAST SURGICAL HISTORY: Past Surgical History:  Procedure Laterality Date  . 1992    . ABDOMINAL HYSTERECTOMY  1990   partial  . ACHILLES TENDON LENGTHENING  1962  . ADENOIDECTOMY  1950  . APPENDECTOMY    . CARPAL TUNNEL RELEASE  1983  . CHOLECYSTECTOMY    . COLONOSCOPY WITH PROPOFOL N/A 12/07/2014   Procedure: COLONOSCOPY WITH PROPOFOL;  Surgeon: Gatha Mayer, MD;  Location: [facility];  Service: Endoscopy;  Laterality: N/A;  . LEFT CARPAL TUNNEL RELEASE X 2    . LUMBAR SYMPATHETECTOMIES X 2    . MULTIPLE RIGHT ANKLE SURGERIES    . NEPHROLITHOTOMY  10/01/2011   Procedure: NEPHROLITHOTOMY PERCUTANEOUS;  Surgeon: Franchot Gallo, MD;  Location: [facility];  Service: Urology;  Laterality: Right;       . PARTIAL AMPUTATION OF LEFT GREAT TOE     1985  . radical cystectomy  1992   neurogenic bladder sec CP  . REPAIR OF NASAL FRACTURE  1985  . REVISION UROSTOMY CUTANEOUS  1992  . RIGHT CARPAL TUNNEL RELEASE    . RIGHT HIP FLEXION CONTRACTURE REPAIR  1986  . RT ANKLE FUSION  1975   SEVERAL RT ANKLE SURGERIES PRIOR TO THE FUSION  . 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 TONSILLECTOMY    . TUBAL  LIGATION  1975  . VAGINAL HYSTERECTOMY  1990  . WISDOM TOOTH EXTRACTION  1971    SOCIAL HISTORY: Social History   Tobacco Use  . Smoking status: Never Smoker  . Smokeless tobacco: Never Used  Substance Use Topics  . Alcohol use: No    Alcohol/week: 0.0 standard drinks  . Drug use: No    FAMILY HISTORY: Family History  Problem Relation Age of Onset  . Heart disease Mother 64       COPD  . Hypertension Mother   . Hyperlipidemia Mother   . Liver disease Mother   . Alcoholism Mother   . Heart disease Father        COPD   . Emphysema Father   . Colon cancer Father 8       Died at 4  . Bladder Cancer Father   . Diabetes Father   . Hyperlipidemia Father   . Cancer Father   . Liver disease Father   . Sleep apnea Father   . Alcoholism Father   . Colon polyps Sister   . Heart disease Maternal Grandmother   . Heart attack Maternal Grandfather   . Esophageal cancer Neg Hx   . Kidney disease Neg Hx     ROS: Review of Systems  Constitutional: Negative for weight loss.  Musculoskeletal: Negative for myalgias.  Endo/Heme/Allergies:       Negative hypoglycemia    PHYSICAL EXAM: Blood pressure 128/74, pulse 76, temperature (!) 97.5 F (36.4 C), temperature source Oral, height 5\' 7"  (1.702 m), weight 215 lb (97.5 kg), SpO2 99 %. Body mass index is 33.67 kg/m. Physical Exam Vitals signs reviewed.  Constitutional:      Appearance: Normal appearance. She is obese.  Cardiovascular:     Rate and Rhythm: Normal rate.     Pulses: Normal pulses.  Pulmonary:     Effort: Pulmonary effort is normal.     Breath sounds: Normal breath sounds.  Musculoskeletal: Normal range of motion.  Skin:    General: Skin is warm and dry.  Neurological:  Mental Status: She is alert and oriented to person, place, and time.  Psychiatric:        Mood and Affect: Mood normal.        Behavior: Behavior normal.     RECENT LABS AND TESTS: BMET    Component Value Date/Time   NA 138  01/11/2019 0922   K 4.0 01/11/2019 0922   CL 108 01/11/2019 0922   CO2 18 (L) 01/11/2019 0922   GLUCOSE 141 (H) 01/11/2019 0922   BUN 49 (H) 01/11/2019 0922   CREATININE 1.53 (H) 01/11/2019 0922   CALCIUM 9.4 01/11/2019 0922   GFRNONAA 37 (L) 01/08/2016 2138   GFRAA 43 (L) 01/08/2016 2138   Lab Results  Component Value Date   HGBA1C 6.8 (H) 12/22/2018   HGBA1C 6.3 (A) 06/18/2018   HGBA1C 6.4 (A) 03/17/2018   HGBA1C 6.3 10/09/2017   HGBA1C 6.6 07/10/2017   Lab Results  Component Value Date   INSULIN 23.5 01/26/2019   CBC    Component Value Date/Time   WBC 9.3 01/26/2019 1545   WBC 6.6 01/15/2016 1008   RBC 4.14 01/26/2019 1545   RBC 3.91 01/15/2016 1008   HGB 11.2 01/26/2019 1545   HCT 36.2 01/26/2019 1545   PLT 379.0 01/15/2016 1008   MCV 87 01/26/2019 1545   MCH 27.1 01/26/2019 1545   MCH 29.1 01/08/2016 2138   MCHC 30.9 (L) 01/26/2019 1545   MCHC 32.9 01/15/2016 1008   RDW 14.5 01/26/2019 1545   LYMPHSABS 2.8 01/26/2019 1545   MONOABS 0.5 01/15/2016 1008   EOSABS 0.1 01/26/2019 1545   BASOSABS 0.1 01/26/2019 1545   Iron/TIBC/Ferritin/ %Sat No results found for: IRON, TIBC, FERRITIN, IRONPCTSAT Lipid Panel     Component Value Date/Time   CHOL 131 01/26/2019 1545   TRIG 171 (H) 01/26/2019 1545   HDL 37 (L) 01/26/2019 1545   CHOLHDL 3 12/22/2018 0911   VLDL 21.2 12/22/2018 0911   LDLCALC 60 01/26/2019 1545   LDLDIRECT 75.0 01/09/2017 1448   Hepatic Function Panel     Component Value Date/Time   PROT 7.0 12/22/2018 0911   ALBUMIN 4.0 12/22/2018 0911   AST 8 12/22/2018 0911   ALT 13 12/22/2018 0911   ALKPHOS 109 12/22/2018 0911   BILITOT 0.5 12/22/2018 0911   BILIDIR 0.1 12/22/2018 0911      Component Value Date/Time   TSH 1.130 01/26/2019 1545   TSH 1.27 01/04/2016 1120   TSH 0.740 07/09/2014 0555      OBESITY BEHAVIORAL INTERVENTION VISIT  Today's visit was # 5   Starting weight: 212 lbs Starting date: 01/26/2019 Today's weight : 215  lbs Today's date: 03/24/2019 Total lbs lost to date: 0 At least 15 minutes were spent on discussing the following behavioral intervention visit.   ASK: We discussed the diagnosis of obesity with Jordan [name] today and Jordan [name] agreed to give [name] permission to discuss obesity behavioral modification therapy today.  ASSESS: Jordan [name] has the diagnosis of obesity and her BMI today is 33.67 Kaylon is in the action stage of change   ADVISE: Jordan [name] was educated on the multiple health risks of obesity as well as the benefit of weight loss to improve her health. She was advised of the need for long term treatment and the importance of lifestyle modifications to improve her current health and to decrease her risk of future health problems.  AGREE: Multiple dietary modification options and treatment options were discussed and  Jordan [name] agreed to follow the recommendations documented  in the above note.  ARRANGE: Jordan [name] was educated on the importance of frequent visits to treat obesity as outlined per CMS and USPSTF guidelines and agreed to schedule her next follow up appointment today.  I, Trixie Dredge, am acting as transcriptionist for Ilene Qua, MD  I have reviewed the above documentation for accuracy and completeness, and I agree with the above. - Ilene Qua, MD

## 2019-03-28 ENCOUNTER — Telehealth (INDEPENDENT_AMBULATORY_CARE_PROVIDER_SITE_OTHER): Payer: Self-pay | Admitting: Family Medicine

## 2019-03-29 MED ORDER — LIRAGLUTIDE 18 MG/3ML ~~LOC~~ SOPN: 0.6000 mg | PEN_INJECTOR | Freq: Every day | SUBCUTANEOUS | 0 refills | Status: DC

## 2019-03-29 MED ORDER — INSULIN PEN NEEDLE 32G X 4 MM MISC: 0 refills | Status: DC

## 2019-04-06 DIAGNOSIS — Z936 Other artificial openings of urinary tract status: Secondary | ICD-10-CM | POA: Diagnosis not present

## 2019-04-11 ENCOUNTER — Ambulatory Visit (INDEPENDENT_AMBULATORY_CARE_PROVIDER_SITE_OTHER): Payer: Medicare Other | Admitting: Family Medicine

## 2019-04-11 ENCOUNTER — Other Ambulatory Visit: Payer: Self-pay

## 2019-04-11 ENCOUNTER — Encounter (INDEPENDENT_AMBULATORY_CARE_PROVIDER_SITE_OTHER): Payer: Self-pay | Admitting: Family Medicine

## 2019-04-11 VITALS — BP 136/76 | HR 64 | Temp 97.6°F | Ht 67.0 in

## 2019-04-11 DIAGNOSIS — R531 Weakness: Secondary | ICD-10-CM

## 2019-04-11 DIAGNOSIS — E119 Type 2 diabetes mellitus without complications: Secondary | ICD-10-CM | POA: Diagnosis not present

## 2019-04-13 NOTE — Progress Notes (Signed)
Office: [phone]  /  Fax: [phone]   HPI:   Chief Complaint: OBESITY Jordan [name] is here to discuss her progress with her obesity treatment plan. She is on the Category 2 plan and is following her eating plan approximately 100 % of the time. She states she is doing bed and chair exercises 20 to 30 minutes 3 times per week. Jordan [name] is unable to be weighed today, due to being unsteady on her feet. She uses a motorized wheelchair to move around and feels that she is getting weaker even doing chair and bed exercises.   She has lost 0 lbs since starting treatment with [name].  Diabetes II Jordan [name] has a diagnosis of diabetes type II. Jordan [name] states that her fasting blood sugar is mostly 135-145 on metformin. She cannot afford to take Victoza. Jordan [name] has done well with diet overall and is still reasonably controlled. Her last A1c was 6.8 on 12/22/18. She has been working on intensive lifestyle modifications including diet, exercise, and weight loss to help control her blood glucose levels.Jordan [name] denies nausea, vomiting, or any hypoglycemic episodes.  Generalized Weakness Jordan [name] is feeling weaker and unable to stand on her own to get a weight. She has done physical therapy in the past and feels that it helped her, but not this year.  ASSESSMENT AND PLAN:  Controlled type 2 diabetes mellitus without complication, without long-term current use of insulin (HCC)  Generalized weakness  PLAN:  Diabetes II Jordan [name] has been given extensive diabetes education by myself today including ideal fasting and post-prandial blood glucose readings, individual ideal Hgb A1c goals, and hypoglycemia prevention. We discussed the importance of good blood sugar control to decrease the likelihood of diabetic complications such as nephropathy, neuropathy, limb loss, blindness, coronary artery disease, and death. We discussed the importance of intensive lifestyle modification including diet, exercise, and weight loss as the  first line treatment for diabetes. Jordan [name] agrees to continue her metformin and diet and we will continue to monitor her. She will follow up at the agreed upon time in 3 weeks.   Generalized Weakness Jordan [name] agrees to start physical therapy for strengthening and gait at [facility] and to follow up as directed.  I spent > than 50% of the 25 minute visit on counseling as documented in the note.  Obesity Jordan [name] is currently in the action stage of change. As such, her goal is to continue with weight loss efforts She has agreed to follow the Category 2 plan Jordan [name] has been instructed to begin physical therapy. We discussed the following Behavioral Modification Strategies today: increasing lean protein intake  Jordan [name] has agreed to follow up with our clinic in 3 weeks. She was informed of the importance of frequent follow up visits to maximize her success with intensive lifestyle modifications for her multiple health conditions.  ALLERGIES: Allergies  Allergen Reactions  . Morphine Shortness Of Breath  . Onglyza [Saxagliptin] Other (See Comments)    Headache within 45 minutes of taking this.  . Baclofen     GI upset  . Lisinopril Cough  . Metoclopramide Hcl     Reglan, irritable  . Penicillins     " MAJOR CASE OF HIVES"    MEDICATIONS: Current Outpatient Medications on File Prior to Visit  Medication Sig Dispense Refill  . ACCU-CHEK FASTCLIX LANCETS MISC USE TO CHECK BLOOD SUGAR ONCE DAILY 102 each 0  . acetaminophen (TYLENOL) 500 MG tablet Take 500 mg by mouth every 6 (six) hours as needed for mild  pain. Pain     . aspirin 81 MG tablet Take 81 mg by mouth daily.     Marland Kitchen b complex vitamins capsule Take 1 capsule by mouth daily.     . cetirizine (ZYRTEC) 10 MG tablet Take 1 tablet (10 mg total) by mouth daily as needed. allergies 90 tablet 1  . Cholecalciferol (VITAMIN D) 2000 UNITS CAPS Take 2,000 Units by mouth See admin instructions. Takes only on days Monday through  thursday    . furosemide (LASIX) 80 MG tablet TAKE 1 TABLET(80 MG) BY MOUTH TWICE DAILY 180 tablet 1  . gabapentin (NEURONTIN) 100 MG capsule TAKE ONE TO TWO CAPSULES BY MOUTH AT NIGHT AS NEEDED FOR NEUROPATHIC PAIN. 180 capsule 2  . glucose blood (ACCU-CHEK SMARTVIEW) test strip USE TO CHECK BLOOD GLUCOSE TWICE DAILY AS DIRECTED 100 each 3  . losartan (COZAAR) 100 MG tablet TAKE 1 TABLET BY MOUTH EVERY DAY 90 tablet 0  . metFORMIN (GLUCOPHAGE) 500 MG tablet TAKE 2 TABLETS BY MOUTH EVERY MORNING AND 2 TABLETS BY MOUTH EVERY EVENING 360 tablet 1  . Multiple Vitamin (MULTIVITAMIN) capsule Take 1 capsule by mouth daily.     . Omega-3 Fatty Acids (FISH OIL) 1200 MG CAPS Take 1,200 mg by mouth 2 (two) times daily.     . polyethylene glycol powder (GLYCOLAX/MIRALAX) powder Take 17 g by mouth daily. One capfull daily by mouth with liquid    . potassium chloride (KLOR-CON 10) 10 MEQ tablet Take 1 tablet (10 mEq total) by mouth daily. 30 tablet 5  . potassium citrate (UROCIT-K) 10 MEQ (1080 MG) SR tablet Take 10 mEq by mouth 3 (three) times daily with meals.    . senna (SENOKOT) 8.6 MG tablet Take 1 tablet by mouth daily.    . simvastatin (ZOCOR) 40 MG tablet TAKE 1 TABLET BY MOUTH EVERY DAY 90 tablet 0  . sodium chloride (OCEAN) 0.65 % nasal spray Place 1-2 sprays into the nose as needed. Allergies      No current facility-administered medications on file prior to visit.     PAST MEDICAL HISTORY: Past Medical History:  Diagnosis Date  . Arthritis    OA  . Back pain   . Bilateral swelling of feet   . Blood transfusion 1992   AFTER  SURGERY TO REMOVE BLADDER  . Cataract   . 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 COLONIC POLYPS 07/11/2010  . Constipation   . Diabetes mellitus    ORAL MED FOR DIABETES  . Gallbladder problem   . GERD (gastroesophageal reflux disease)    PAST  HISTORY GERD--WATCHES DIET - NOT ON ANY MEDS FOR GERD  . HYPERLIPIDEMIA 11/29/2009  . HYPERTENSION 11/29/2009  . Incisional hernia    NO PAIN-NOT CAUSING ANY DISCOMFORT  . Kidney infection   . Osteoarthritis   . Osteomyelitis ([place])   . PONV (postoperative nausea and vomiting)   . 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 Shortness of breath   . Stroke (cerebrum) ([place])     PAST SURGICAL HISTORY: Past Surgical History:  Procedure Laterality Date  . 1992    . ABDOMINAL HYSTERECTOMY  1990   partial  . ACHILLES TENDON LENGTHENING  1962  . ADENOIDECTOMY  1950  . APPENDECTOMY    . CARPAL TUNNEL RELEASE  1983  . CHOLECYSTECTOMY    .  COLONOSCOPY WITH PROPOFOL N/A 12/07/2014   Procedure: COLONOSCOPY WITH PROPOFOL;  Surgeon: Gatha Mayer, MD;  Location: [facility];  Service: Endoscopy;  Laterality: N/A;  . LEFT CARPAL TUNNEL RELEASE X 2    . LUMBAR SYMPATHETECTOMIES X 2    . MULTIPLE RIGHT ANKLE SURGERIES    . NEPHROLITHOTOMY  10/01/2011   Procedure: NEPHROLITHOTOMY PERCUTANEOUS;  Surgeon: Franchot Gallo, MD;  Location: [facility];  Service: Urology;  Laterality: Right;       . PARTIAL AMPUTATION OF LEFT GREAT TOE     1985  . radical cystectomy  1992   neurogenic bladder sec CP  . REPAIR OF NASAL FRACTURE  1985  . REVISION UROSTOMY CUTANEOUS  1992  . RIGHT CARPAL TUNNEL RELEASE    . RIGHT HIP FLEXION CONTRACTURE REPAIR  1986  . RT ANKLE FUSION  1975   SEVERAL RT ANKLE SURGERIES PRIOR TO THE FUSION  . 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 TONSILLECTOMY    . TUBAL LIGATION  1975  . VAGINAL HYSTERECTOMY  1990  . WISDOM TOOTH EXTRACTION  1971    SOCIAL HISTORY: Social History   Tobacco Use  . Smoking status: Never Smoker  . Smokeless tobacco: Never Used  Substance Use Topics  . Alcohol use: No    Alcohol/week: 0.0 standard drinks  . Drug use: No    FAMILY HISTORY: Family History  Problem Relation Age of Onset  . Heart disease Mother 48        COPD  . Hypertension Mother   . Hyperlipidemia Mother   . Liver disease Mother   . Alcoholism Mother   . Heart disease Father        COPD   . Emphysema Father   . Colon cancer Father 12       Died at 33  . Bladder Cancer Father   . Diabetes Father   . Hyperlipidemia Father   . Cancer Father   . Liver disease Father   . Sleep apnea Father   . Alcoholism Father   . Colon polyps Sister   . Heart disease Maternal Grandmother   . Heart attack Maternal Grandfather   . Esophageal cancer Neg Hx   . Kidney disease Neg Hx     ROS: Review of Systems  Gastrointestinal: Negative for nausea and vomiting.  Endo/Heme/Allergies:       Negative for hypoglycemia.    PHYSICAL EXAM: Blood pressure 136/76, pulse 64, temperature 97.6 F (36.4 C), temperature source Oral, height 5\' 7"  (1.702 m), SpO2 99 %. Body mass index is 33.67 kg/m. Physical Exam Vitals signs reviewed.  Constitutional:      Appearance: Normal appearance. She is obese.  Cardiovascular:     Rate and Rhythm: Normal rate.  Pulmonary:     Effort: Pulmonary effort is normal.  Musculoskeletal: Normal range of motion.  Skin:    General: Skin is warm and dry.  Neurological:     Mental Status: She is alert and oriented to person, place, and time.  Psychiatric:        Mood and Affect: Mood normal.        Behavior: Behavior normal.     RECENT LABS AND TESTS: BMET    Component Value Date/Time   NA 138 01/11/2019 0922   K 4.0 01/11/2019 0922   CL 108 01/11/2019 0922   CO2 18 (L) 01/11/2019 0922   GLUCOSE 141 (H) 01/11/2019 0922   BUN 49 (H) 01/11/2019  [id number]   CREATININE 1.53 (H) 01/11/2019 0922   CALCIUM 9.4 01/11/2019 0922   GFRNONAA 37 (L) 01/08/2016 2138   GFRAA 43 (L) 01/08/2016 2138   Lab Results  Component Value Date   HGBA1C 6.8 (H) 12/22/2018   HGBA1C 6.3 (A) 06/18/2018   HGBA1C 6.4 (A) 03/17/2018   HGBA1C 6.3 10/09/2017   HGBA1C 6.6 07/10/2017   Lab Results  Component Value Date   INSULIN 23.5  01/26/2019   CBC    Component Value Date/Time   WBC 9.3 01/26/2019 1545   WBC 6.6 01/15/2016 1008   RBC 4.14 01/26/2019 1545   RBC 3.91 01/15/2016 1008   HGB 11.2 01/26/2019 1545   HCT 36.2 01/26/2019 1545   PLT 379.0 01/15/2016 1008   MCV 87 01/26/2019 1545   MCH 27.1 01/26/2019 1545   MCH 29.1 01/08/2016 2138   MCHC 30.9 (L) 01/26/2019 1545   MCHC 32.9 01/15/2016 1008   RDW 14.5 01/26/2019 1545   LYMPHSABS 2.8 01/26/2019 1545   MONOABS 0.5 01/15/2016 1008   EOSABS 0.1 01/26/2019 1545   BASOSABS 0.1 01/26/2019 1545   Iron/TIBC/Ferritin/ %Sat No results found for: IRON, TIBC, FERRITIN, IRONPCTSAT Lipid Panel     Component Value Date/Time   CHOL 131 01/26/2019 1545   TRIG 171 (H) 01/26/2019 1545   HDL 37 (L) 01/26/2019 1545   CHOLHDL 3 12/22/2018 0911   VLDL 21.2 12/22/2018 0911   LDLCALC 60 01/26/2019 1545   LDLDIRECT 75.0 01/09/2017 1448   Hepatic Function Panel     Component Value Date/Time   PROT 7.0 12/22/2018 0911   ALBUMIN 4.0 12/22/2018 0911   AST 8 12/22/2018 0911   ALT 13 12/22/2018 0911   ALKPHOS 109 12/22/2018 0911   BILITOT 0.5 12/22/2018 0911   BILIDIR 0.1 12/22/2018 0911      Component Value Date/Time   TSH 1.130 01/26/2019 1545   TSH 1.27 01/04/2016 1120   TSH 0.740 07/09/2014 0555   OBESITY BEHAVIORAL INTERVENTION VISIT  Today's visit was # 6  Starting weight: 212 lbs Starting date: 01/26/19 Today's weight : unable to obtain weight Today's date: 04/11/2019 Total lbs lost to date: 0    04/11/2019  Height 5\' 7"  (1.702 m)  BLOOD PRESSURE - SYSTOLIC [id number]  BLOOD PRESSURE - DIASTOLIC 76    ASK: We discussed the diagnosis of obesity with Jordan [name] today and Jordan [name] agreed to give [name] permission to discuss obesity behavioral modification therapy today.  ASSESS: Jordan [name] is in the action stage of change.   ADVISE: Jordan [name] was educated on the multiple health risks of obesity as well as the benefit of weight loss to improve her health. She  was advised of the need for long term treatment and the importance of lifestyle modifications to improve her current health and to decrease her risk of future health problems.  AGREE: Multiple dietary modification options and treatment options were discussed and Trenisha agreed to follow the recommendations documented in the above note.  ARRANGE: Jordan [name] was educated on the importance of frequent visits to treat obesity as outlined per CMS and USPSTF guidelines and agreed to schedule her next follow up appointment today.  IMarcille Blanco, CMA, am acting as transcriptionist for Starlyn Skeans, MD  I have reviewed the above documentation for accuracy and completeness, and I agree with the above. -Dennard Nip, MD

## 2019-04-18 DIAGNOSIS — H40013 Open angle with borderline findings, low risk, bilateral: Secondary | ICD-10-CM | POA: Diagnosis not present

## 2019-04-18 DIAGNOSIS — E119 Type 2 diabetes mellitus without complications: Secondary | ICD-10-CM | POA: Diagnosis not present

## 2019-04-18 DIAGNOSIS — H04123 Dry eye syndrome of bilateral lacrimal glands: Secondary | ICD-10-CM | POA: Diagnosis not present

## 2019-04-18 DIAGNOSIS — Z961 Presence of intraocular lens: Secondary | ICD-10-CM | POA: Diagnosis not present

## 2019-04-18 LAB — HM DIABETES EYE EXAM

## 2019-04-21 ENCOUNTER — Encounter: Payer: Self-pay | Admitting: Family Medicine

## 2019-04-25 ENCOUNTER — Encounter (INDEPENDENT_AMBULATORY_CARE_PROVIDER_SITE_OTHER): Payer: Self-pay | Admitting: Family Medicine

## 2019-04-25 ENCOUNTER — Other Ambulatory Visit: Payer: Self-pay

## 2019-04-25 ENCOUNTER — Ambulatory Visit (INDEPENDENT_AMBULATORY_CARE_PROVIDER_SITE_OTHER): Payer: Medicare Other | Admitting: Family Medicine

## 2019-04-25 VITALS — BP 140/69 | HR 87 | Temp 97.9°F | Ht 67.0 in | Wt 231.0 lb

## 2019-04-25 DIAGNOSIS — Z6833 Body mass index (BMI) 33.0-33.9, adult: Secondary | ICD-10-CM

## 2019-04-25 DIAGNOSIS — I10 Essential (primary) hypertension: Secondary | ICD-10-CM | POA: Diagnosis not present

## 2019-04-25 DIAGNOSIS — E669 Obesity, unspecified: Secondary | ICD-10-CM | POA: Diagnosis not present

## 2019-04-25 NOTE — Progress Notes (Signed)
Office: [phone]  /  Fax: [phone]   HPI:   Chief Complaint: OBESITY Jordan [name] is here to discuss her progress with her obesity treatment plan. She is on the Category 2 plan and is following her eating plan approximately 90 % of the time. She states she is exercising 0 minutes 0 times per week. Jordan [name] is frustrated with her weight. She states she is following her plan relatively closely, but has not been losing weight. Her activity is limited. She still hasn't heard back from physical therapy.  Her weight is 231 lb (104.8 kg) today and unable to determine her weight lose since her last visit. She has lost 0 lbs since starting treatment with [name].  Hypertension Jordan [name] is a 73 y.o. female with hypertension. Jordan [name]'s blood pressure is borderline elevated today. She is on losartan, normally better controlled. She denies chest pain or headaches. She is working on weight loss to help control her blood pressure with the goal of decreasing her risk of heart attack and stroke.   ASSESSMENT AND PLAN:  Essential hypertension  Class 1 obesity with serious comorbidity and body mass index (BMI) of 33.0 to 33.9 in adult, unspecified obesity type  PLAN:  Hypertension We discussed sodium restriction, working on healthy weight loss, and a regular exercise program as the means to achieve improved blood pressure control. Jordan [name] agreed with this plan and agreed to follow up as directed. We will continue to monitor her blood pressure as well as her progress with the above lifestyle modifications. Jordan [name] will continue her medications and diet, and will watch for signs of hypotension as she continues her lifestyle modifications. Jordan [name] agrees to follow up with our clinic in 3 weeks.  I spent > than 50% of the 15 minute visit on counseling as documented in the note.  Obesity Jordan [name] is currently in the action stage of change. As such, her goal is to continue with weight loss efforts She has agreed to  change to follow a lower carbohydrate, vegetable and lean protein rich diet plan Jordan [name] has been instructed to work up to a goal of 150 minutes of combined cardio and strengthening exercise per week for weight loss and overall health benefits. We discussed the following Behavioral Modification Strategies today: increasing lean protein intake, decreasing simple carbohydrates  and holiday eating strategies  Jordan [name] will contact physical therapy to get therapy restarted.  Jordan [name] has agreed to follow up with our clinic in 3 weeks. She was informed of the importance of frequent follow up visits to maximize her success with intensive lifestyle modifications for her multiple health conditions.  ALLERGIES: Allergies  Allergen Reactions  . Morphine Shortness Of Breath  . Onglyza [Saxagliptin] Other (See Comments)    Headache within 45 minutes of taking this.  . Baclofen     GI upset  . Lisinopril Cough  . Metoclopramide Hcl     Reglan, irritable  . Penicillins     " MAJOR CASE OF HIVES"    MEDICATIONS: Current Outpatient Medications on File Prior to Visit  Medication Sig Dispense Refill  . ACCU-CHEK FASTCLIX LANCETS MISC USE TO CHECK BLOOD SUGAR ONCE DAILY 102 each 0  . acetaminophen (TYLENOL) 500 MG tablet Take 500 mg by mouth every 6 (six) hours as needed for mild pain. Pain     . aspirin 81 MG tablet Take 81 mg by mouth daily.     . cetirizine (ZYRTEC) 10 MG tablet Take 1 tablet (10 mg total) by mouth daily as  needed. allergies 90 tablet 1  . Cholecalciferol (VITAMIN D) 2000 UNITS CAPS Take 2,000 Units by mouth See admin instructions. Takes only on days Monday through thursday    . furosemide (LASIX) 80 MG tablet TAKE 1 TABLET(80 MG) BY MOUTH TWICE DAILY 180 tablet 1  . gabapentin (NEURONTIN) 100 MG capsule TAKE ONE TO TWO CAPSULES BY MOUTH AT NIGHT AS NEEDED FOR NEUROPATHIC PAIN. 180 capsule 2  . glucose blood (ACCU-CHEK SMARTVIEW) test strip USE TO CHECK BLOOD GLUCOSE TWICE DAILY AS  DIRECTED 100 each 3  . losartan (COZAAR) 100 MG tablet TAKE 1 TABLET BY MOUTH EVERY DAY 90 tablet 0  . metFORMIN (GLUCOPHAGE) 500 MG tablet TAKE 2 TABLETS BY MOUTH EVERY MORNING AND 2 TABLETS BY MOUTH EVERY EVENING 360 tablet 1  . Multiple Vitamin (MULTIVITAMIN) capsule Take 1 capsule by mouth daily.     . Omega-3 Fatty Acids (FISH OIL) 1200 MG CAPS Take 1,200 mg by mouth 2 (two) times daily.     . polyethylene glycol powder (GLYCOLAX/MIRALAX) powder Take 17 g by mouth daily. One capfull daily by mouth with liquid    . potassium citrate (UROCIT-K) 10 MEQ (1080 MG) SR tablet Take 10 mEq by mouth 3 (three) times daily with meals.    . senna (SENOKOT) 8.6 MG tablet Take 1 tablet by mouth daily.    . simvastatin (ZOCOR) 40 MG tablet TAKE 1 TABLET BY MOUTH EVERY DAY 90 tablet 0  . sodium chloride (OCEAN) 0.65 % nasal spray Place 1-2 sprays into the nose as needed. Allergies      No current facility-administered medications on file prior to visit.     PAST MEDICAL HISTORY: Past Medical History:  Diagnosis Date  . Arthritis    OA  . Back pain   . Bilateral swelling of feet   . Blood transfusion 1992   AFTER  SURGERY TO REMOVE BLADDER  . Cataract   . 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 COLONIC POLYPS 07/11/2010  . Constipation   . Diabetes mellitus    ORAL MED FOR DIABETES  . Gallbladder problem   . GERD (gastroesophageal reflux disease)    PAST HISTORY GERD--WATCHES DIET - NOT ON ANY MEDS FOR GERD  . HYPERLIPIDEMIA 11/29/2009  . HYPERTENSION 11/29/2009  . Incisional hernia    NO PAIN-NOT CAUSING ANY DISCOMFORT  . Kidney infection   . Osteoarthritis   . Osteomyelitis ([place])   . PONV (postoperative nausea and vomiting)   . 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 Shortness of breath   . Stroke (cerebrum) ([place])     PAST SURGICAL HISTORY: Past Surgical  History:  Procedure Laterality Date  . 1992    . ABDOMINAL HYSTERECTOMY  1990   partial  . ACHILLES TENDON LENGTHENING  1962  . ADENOIDECTOMY  1950  . APPENDECTOMY    . CARPAL TUNNEL RELEASE  1983  . CHOLECYSTECTOMY    . COLONOSCOPY WITH PROPOFOL N/A 12/07/2014   Procedure: COLONOSCOPY WITH PROPOFOL;  Surgeon: Gatha Mayer, MD;  Location: [facility];  Service: Endoscopy;  Laterality: N/A;  . LEFT CARPAL TUNNEL RELEASE X 2    . LUMBAR SYMPATHETECTOMIES X 2    . MULTIPLE RIGHT ANKLE SURGERIES    . NEPHROLITHOTOMY  10/01/2011   Procedure: NEPHROLITHOTOMY PERCUTANEOUS;  Surgeon: Franchot Gallo, MD;  Location: [facility];  Service: Urology;  Laterality: Right;       . PARTIAL AMPUTATION OF LEFT GREAT TOE     1985  . radical cystectomy  1992   neurogenic bladder sec CP  . REPAIR OF NASAL FRACTURE  1985  . REVISION UROSTOMY CUTANEOUS  1992  . RIGHT CARPAL TUNNEL RELEASE    . RIGHT HIP FLEXION CONTRACTURE REPAIR  1986  . RT ANKLE FUSION  1975   SEVERAL RT ANKLE SURGERIES PRIOR TO THE FUSION  . 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 TONSILLECTOMY    . TUBAL LIGATION  1975  . VAGINAL HYSTERECTOMY  1990  . WISDOM TOOTH EXTRACTION  1971    SOCIAL HISTORY: Social History   Tobacco Use  . Smoking status: Never Smoker  . Smokeless tobacco: Never Used  Substance Use Topics  . Alcohol use: No    Alcohol/week: 0.0 standard drinks  . Drug use: No    FAMILY HISTORY: Family History  Problem Relation Age of Onset  . Heart disease Mother 83       COPD  . Hypertension Mother   . Hyperlipidemia Mother   . Liver disease Mother   . Alcoholism Mother   . Heart disease Father        COPD   . Emphysema Father   . Colon cancer Father 31       Died at 5  . Bladder Cancer Father   . Diabetes Father   . Hyperlipidemia Father   . Cancer Father   . Liver disease Father   . Sleep apnea Father   . Alcoholism Father   . Colon polyps Sister   . Heart disease Maternal  Grandmother   . Heart attack Maternal Grandfather   . Esophageal cancer Neg Hx   . Kidney disease Neg Hx     ROS: Review of Systems  Constitutional: Negative for weight loss.  Cardiovascular: Negative for chest pain.  Neurological: Negative for headaches.    PHYSICAL EXAM: Blood pressure 140/69, pulse 87, temperature 97.9 F (36.6 C), temperature source Oral, height 5\' 7"  (1.702 m), weight 231 lb (104.8 kg), SpO2 98 %. Body mass index is 36.18 kg/m. Physical Exam Vitals signs reviewed.  Constitutional:      Appearance: Normal appearance. She is obese.  Cardiovascular:     Rate and Rhythm: Normal rate.     Pulses: Normal pulses.  Pulmonary:     Effort: Pulmonary effort is normal.     Breath sounds: Normal breath sounds.  Musculoskeletal: Normal range of motion.  Skin:    General: Skin is warm and dry.  Neurological:     Mental Status: She is alert and oriented to person, place, and time.  Psychiatric:        Mood and Affect: Mood normal.        Behavior: Behavior normal.     RECENT LABS AND TESTS: BMET    Component Value Date/Time   NA 138 01/11/2019 0922   K 4.0 01/11/2019 0922   CL 108 01/11/2019 0922   CO2 18 (L) 01/11/2019 0922   GLUCOSE 141 (H) 01/11/2019 0922   BUN 49 (H) 01/11/2019 0922   CREATININE 1.53 (H) 01/11/2019 0922   CALCIUM 9.4 01/11/2019 0922   GFRNONAA 37 (L) 01/08/2016 2138   GFRAA 43 (L) 01/08/2016 2138   Lab Results  Component Value Date   HGBA1C 6.8 (H) 12/22/2018   HGBA1C 6.3 (A) 06/18/2018   HGBA1C 6.4 (A)  03/17/2018   HGBA1C 6.3 10/09/2017   HGBA1C 6.6 07/10/2017   Lab Results  Component Value Date   INSULIN 23.5 01/26/2019   CBC    Component Value Date/Time   WBC 9.3 01/26/2019 1545   WBC 6.6 01/15/2016 1008   RBC 4.14 01/26/2019 1545   RBC 3.91 01/15/2016 1008   HGB 11.2 01/26/2019 1545   HCT 36.2 01/26/2019 1545   PLT 379.0 01/15/2016 1008   MCV 87 01/26/2019 1545   MCH 27.1 01/26/2019 1545   MCH 29.1 01/08/2016  2138   MCHC 30.9 (L) 01/26/2019 1545   MCHC 32.9 01/15/2016 1008   RDW 14.5 01/26/2019 1545   LYMPHSABS 2.8 01/26/2019 1545   MONOABS 0.5 01/15/2016 1008   EOSABS 0.1 01/26/2019 1545   BASOSABS 0.1 01/26/2019 1545   Iron/TIBC/Ferritin/ %Sat No results found for: IRON, TIBC, FERRITIN, IRONPCTSAT Lipid Panel     Component Value Date/Time   CHOL 131 01/26/2019 1545   TRIG 171 (H) 01/26/2019 1545   HDL 37 (L) 01/26/2019 1545   CHOLHDL 3 12/22/2018 0911   VLDL 21.2 12/22/2018 0911   LDLCALC 60 01/26/2019 1545   LDLDIRECT 75.0 01/09/2017 1448   Hepatic Function Panel     Component Value Date/Time   PROT 7.0 12/22/2018 0911   ALBUMIN 4.0 12/22/2018 0911   AST 8 12/22/2018 0911   ALT 13 12/22/2018 0911   ALKPHOS 109 12/22/2018 0911   BILITOT 0.5 12/22/2018 0911   BILIDIR 0.1 12/22/2018 0911      Component Value Date/Time   TSH 1.130 01/26/2019 1545   TSH 1.27 01/04/2016 1120   TSH 0.740 07/09/2014 0555      OBESITY BEHAVIORAL INTERVENTION VISIT  Today's visit was # 7   Starting weight: 212 lbs Starting date: 01/26/2019 Today's weight : 231 lbs Today's date: 04/25/2019 Total lbs lost to date: 0    ASK: We discussed the diagnosis of obesity with Jordan [name] today and Jordan [name] agreed to give [name] permission to discuss obesity behavioral modification therapy today.  ASSESS: Jordan [name] has the diagnosis of obesity and her BMI today is 36.17 Jordan [name] is in the action stage of change   ADVISE: Jordan [name] was educated on the multiple health risks of obesity as well as the benefit of weight loss to improve her health. She was advised of the need for long term treatment and the importance of lifestyle modifications to improve her current health and to decrease her risk of future health problems.  AGREE: Multiple dietary modification options and treatment options were discussed and  Jordan [name] agreed to follow the recommendations documented in the above note.  ARRANGE: Jordan [name] was  educated on the importance of frequent visits to treat obesity as outlined per CMS and USPSTF guidelines and agreed to schedule her next follow up appointment today.  I, Trixie Dredge, am acting as transcriptionist for Dennard Nip, MD  I have reviewed the above documentation for accuracy and completeness, and I agree with the above. -Dennard Nip, MD

## 2019-04-26 ENCOUNTER — Ambulatory Visit (INDEPENDENT_AMBULATORY_CARE_PROVIDER_SITE_OTHER): Payer: Medicare Other | Admitting: Family Medicine

## 2019-04-26 ENCOUNTER — Encounter: Payer: Self-pay | Admitting: Family Medicine

## 2019-04-26 VITALS — BP 118/64 | HR 74 | Temp 98.2°F | Resp 18

## 2019-04-26 DIAGNOSIS — E119 Type 2 diabetes mellitus without complications: Secondary | ICD-10-CM | POA: Diagnosis not present

## 2019-04-26 DIAGNOSIS — Z23 Encounter for immunization: Secondary | ICD-10-CM | POA: Diagnosis not present

## 2019-04-26 DIAGNOSIS — I10 Essential (primary) hypertension: Secondary | ICD-10-CM | POA: Diagnosis not present

## 2019-04-26 DIAGNOSIS — E669 Obesity, unspecified: Secondary | ICD-10-CM

## 2019-04-26 DIAGNOSIS — I878 Other specified disorders of veins: Secondary | ICD-10-CM

## 2019-04-26 DIAGNOSIS — E66811 Obesity, class 1: Secondary | ICD-10-CM | POA: Insufficient documentation

## 2019-04-26 LAB — POCT GLYCOSYLATED HEMOGLOBIN (HGB A1C): Hemoglobin A1C: 6.6 % — AB (ref 4.0–5.6)

## 2019-04-26 NOTE — Progress Notes (Signed)
Subjective:     Patient ID: Jordan [name], female   DOB: 1945/09/07, 73 y.o.   MRN: [id number]  HPI   Jordan [name] is here for medical follow up.  She has been recently going to the [place] weight loss clinic.  She is struggling to get weighed there as she has difficulty standing alone for more than a few seconds. She has not lost any weight thus far and is frustrated with that.  She is basically wheelchair -bound and unable to exercise much.  She is looking at possible exercise options such as Silver Sneakers or possibly some type of home exercise equipment.  She has greatly restricted carbohydrate intake.  Type 2 diabetes .   Remains on metformin.  Weight loss clinic tried to start Victoza but she could not afford secondary to cost.    Chronic bilateral leg edema stable on lasix and support hose.  On Losartan for hypertension and Zocor for lipids.  Past Medical History:  Diagnosis Date  . Arthritis    OA  . Back pain   . Bilateral swelling of feet   . Blood transfusion 1992   AFTER  SURGERY TO REMOVE BLADDER  . Cataract   . 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 COLONIC POLYPS 07/11/2010  . Constipation   . Diabetes mellitus    ORAL MED FOR DIABETES  . Gallbladder problem   . GERD (gastroesophageal reflux disease)    PAST HISTORY GERD--WATCHES DIET - NOT ON ANY MEDS FOR GERD  . HYPERLIPIDEMIA 11/29/2009  . HYPERTENSION 11/29/2009  . Incisional hernia    NO PAIN-NOT CAUSING ANY DISCOMFORT  . Kidney infection   . Osteoarthritis   . Osteomyelitis ([place])   . PONV (postoperative nausea and vomiting)   . 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 Shortness of breath   . Stroke (cerebrum) [facility])    Past Surgical History:  Procedure Laterality Date  . 1992    . ABDOMINAL HYSTERECTOMY  1990   partial  . ACHILLES TENDON LENGTHENING  1962  . ADENOIDECTOMY   1950  . APPENDECTOMY    . CARPAL TUNNEL RELEASE  1983  . CHOLECYSTECTOMY    . COLONOSCOPY WITH PROPOFOL N/A 12/07/2014   Procedure: COLONOSCOPY WITH PROPOFOL;  Surgeon: Gatha Mayer, MD;  [facility];  Service: Endoscopy;  Laterality: N/A;  . LEFT CARPAL TUNNEL RELEASE X 2    . LUMBAR SYMPATHETECTOMIES X 2    . MULTIPLE RIGHT ANKLE SURGERIES    . NEPHROLITHOTOMY  10/01/2011   Procedure: NEPHROLITHOTOMY PERCUTANEOUS;  Surgeon: Franchot Gallo, MD;  Location[facility];  Service: Urology;  Laterality: Right;       . PARTIAL AMPUTATION OF LEFT GREAT TOE     1985  . radical cystectomy  1992   neurogenic bladder sec CP  . REPAIR OF NASAL FRACTURE  1985  . REVISION UROSTOMY CUTANEOUS  1992  . RIGHT CARPAL TUNNEL RELEASE    . RIGHT HIP FLEXION CONTRACTURE REPAIR  1986  . RT ANKLE FUSION  1975   SEVERAL RT ANKLE SURGERIES PRIOR TO THE FUSION  . 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 TONSILLECTOMY    . TUBAL LIGATION  1975  . VAGINAL HYSTERECTOMY  1990  . Icard    reports that she has  never smoked. She has never used smokeless tobacco. She reports that she does not drink alcohol or use drugs. family history includes Alcoholism in her father and mother; Bladder Cancer in her father; Cancer in her father; Colon cancer (age of onset: 77) in her father; Colon polyps in her sister; Diabetes in her father; Emphysema in her father; Heart attack in her maternal grandfather; Heart disease in her father and maternal grandmother; Heart disease (age of onset: 90) in her mother; Hyperlipidemia in her father and mother; Hypertension in her mother; Liver disease in her father and mother; Sleep apnea in her father. Allergies  Allergen Reactions  . Morphine Shortness Of Breath  . Onglyza [Saxagliptin] Other (See Comments)    Headache within 45 minutes of taking this.  . Baclofen     GI upset  . Lisinopril Cough  . Metoclopramide Hcl     Reglan, irritable   . Penicillins     " MAJOR CASE OF HIVES"       Review of Systems  Constitutional: Negative for fatigue.  Eyes: Negative for visual disturbance.  Respiratory: Negative for cough, chest tightness, shortness of breath and wheezing.   Cardiovascular: Negative for chest pain and palpitations.  Neurological: Negative for dizziness, seizures, syncope, weakness, light-headedness and headaches.       Objective:   Physical Exam Vitals signs reviewed.  Constitutional:      Appearance: Normal appearance.  Cardiovascular:     Rate and Rhythm: Normal rate and regular rhythm.  Pulmonary:     Effort: Pulmonary effort is normal.     Breath sounds: Normal breath sounds.  Musculoskeletal:     Comments: Support hose on - not removed.    Neurological:     Mental Status: She is alert.        Assessment:     #1 Type 2 diabetes fairly well controlled with A1C 6.6%  #2 Hypertension- stable  #3 chronic venous stasis edema- stable  #4 obesity    Plan:     -continue with Metformin - would like to have her on GLP-1 med but cost prohibitive - discussed exercise options to hopefully aid in weight loss. -flu vaccine given today -routine follow up in 3 months.  [name] MD [facility] at [facility]

## 2019-04-27 ENCOUNTER — Encounter (INDEPENDENT_AMBULATORY_CARE_PROVIDER_SITE_OTHER): Payer: Self-pay

## 2019-04-27 ENCOUNTER — Other Ambulatory Visit: Payer: Self-pay | Admitting: Family Medicine

## 2019-04-27 NOTE — Progress Notes (Signed)
Jordan [name] called on 04/26/2019 in the afternoon.  She requested all appointments to be cancelled with our office, [facility].  Patient stated she had met with her primary care doctor and it was decided that she was not achieving the results she wanted and this was not the program for her.   The upcoming appointment with Dr Leafy Ro was cancelled and patient was given our 6 month criteria of returning to the program. Patient gave verbal understanding but agreed to discontinue our program at this time.

## 2019-05-04 ENCOUNTER — Other Ambulatory Visit: Payer: Self-pay

## 2019-05-04 NOTE — Patient Outreach (Signed)
[facility]) Care Management  05/04/2019  Jordan [name] 01-11-1946 [id number]   Medication Adherence call to Jordan [name] [facility] Identifiers Verify spoke with patient he is past due on Simvastatin 40 mg and Losartan 100 mg.patient ask to call [facility] to have it ready ,[facility] will have it ready for patient to pick up. Jordan [name] is showing past due under [place].   [facility] Direct Dial [phone]  Fax [phone] [email or web address]

## 2019-05-05 DIAGNOSIS — R262 Difficulty in walking, not elsewhere classified: Secondary | ICD-10-CM | POA: Diagnosis not present

## 2019-05-05 DIAGNOSIS — Z936 Other artificial openings of urinary tract status: Secondary | ICD-10-CM | POA: Diagnosis not present

## 2019-05-05 DIAGNOSIS — M6281 Muscle weakness (generalized): Secondary | ICD-10-CM | POA: Diagnosis not present

## 2019-05-11 ENCOUNTER — Other Ambulatory Visit: Payer: Self-pay | Admitting: Family Medicine

## 2019-05-11 MED ORDER — SIMVASTATIN 40 MG PO TABS: 40.0000 mg | ORAL_TABLET | Freq: Every day | ORAL | 0 refills | Status: DC

## 2019-05-11 NOTE — Telephone Encounter (Signed)
Patient requesting simvastatin (ZOCOR) 40 MG tablet ,patient states pharmacy made several request, patient is out and would like request expedited, please advise  [facility] [place], [address] AT [address]

## 2019-05-16 ENCOUNTER — Ambulatory Visit (INDEPENDENT_AMBULATORY_CARE_PROVIDER_SITE_OTHER): Payer: Medicare Other | Admitting: Family Medicine

## 2019-06-01 ENCOUNTER — Other Ambulatory Visit: Payer: Self-pay

## 2019-06-01 MED ORDER — ACCU-CHEK FASTCLIX LANCETS MISC: 1 refills | Status: AC

## 2019-06-20 ENCOUNTER — Telehealth: Payer: Self-pay

## 2019-06-20 NOTE — Telephone Encounter (Signed)
Copied from [place] [id number]. Topic: General - Inquiry >> Jun 20, 2019  8:56 AM Mathis Bud wrote: Reason for CRM: Patient states accu-chek nano is not working anymore.  Patient would like to know if PCP can write a prescription or if PCP or nurse can tell her which one to buy at drug store.   Call back [phone]

## 2019-06-21 ENCOUNTER — Other Ambulatory Visit: Payer: Self-pay

## 2019-06-21 MED ORDER — ACCU-CHEK NANO SMARTVIEW W/DEVICE KIT: PACK | 1 refills | Status: AC

## 2019-06-21 NOTE — Telephone Encounter (Signed)
Called patient and let her know that I have sent in a new Accu-chek Nano Smartview meter to the [employer] for her. I let her know that if her insurance does not cover this brand to please check with pharmacist or insurance and let us know which one is covered. Patient verbalized an understanding.

## 2019-06-27 ENCOUNTER — Ambulatory Visit: Payer: Medicare HMO

## 2019-07-26 ENCOUNTER — Other Ambulatory Visit: Payer: Self-pay

## 2019-07-27 ENCOUNTER — Ambulatory Visit: Payer: Medicare HMO | Admitting: Family Medicine

## 2019-07-29 ENCOUNTER — Ambulatory Visit: Payer: Medicare HMO | Admitting: Family Medicine

## 2019-07-29 DIAGNOSIS — Z87440 Personal history of urinary (tract) infections: Secondary | ICD-10-CM | POA: Diagnosis not present

## 2019-07-29 DIAGNOSIS — N200 Calculus of kidney: Secondary | ICD-10-CM | POA: Diagnosis not present

## 2019-08-01 ENCOUNTER — Ambulatory Visit (INDEPENDENT_AMBULATORY_CARE_PROVIDER_SITE_OTHER): Payer: Medicare HMO | Admitting: Family Medicine

## 2019-08-01 ENCOUNTER — Other Ambulatory Visit: Payer: Self-pay

## 2019-08-01 ENCOUNTER — Encounter: Payer: Self-pay | Admitting: Family Medicine

## 2019-08-01 VITALS — BP 112/72 | HR 78 | Temp 96.8°F

## 2019-08-01 DIAGNOSIS — E119 Type 2 diabetes mellitus without complications: Secondary | ICD-10-CM

## 2019-08-01 DIAGNOSIS — I878 Other specified disorders of veins: Secondary | ICD-10-CM

## 2019-08-01 DIAGNOSIS — I10 Essential (primary) hypertension: Secondary | ICD-10-CM | POA: Diagnosis not present

## 2019-08-01 LAB — POCT GLYCOSYLATED HEMOGLOBIN (HGB A1C): Hemoglobin A1C: 7.4 % — AB (ref 4.0–5.6)

## 2019-08-01 MED ORDER — TRULICITY 0.75 MG/0.5ML ~~LOC~~ SOAJ: 0.7500 mg | SUBCUTANEOUS | 11 refills | Status: DC

## 2019-08-01 NOTE — Patient Instructions (Signed)
Start the Trulicity [id number] mg once weekly  Return this Friday for labs.

## 2019-08-01 NOTE — Progress Notes (Signed)
Subjective:     Patient ID: Jordan [name], female   DOB: 10-Aug-1945, 74 y.o.   MRN: [id number]  HPI   Jordan [name] is here for medical follow-up.  She had gone this past year to the weight loss treatment center but did not have much success.  She has decided not to go back. Her diabetes has generally been well controlled.  Her last A1c is 6.6%.  She is currently treated only with Metformin for her diabetes.  She recently got a new glucometer but has had some problems with accurate readings.  She thinks her weight is relatively stable.  She denies any dietary changes.  She is wheelchair-bound and does not get any regular activity.  Chronic bilateral leg edema right greater than left felt mostly to be secondary to venous stasis.  She does not ambulate which worsens her problem.  She does use regular compression.  She has hypertension treated with losartan  Past Medical History:  Diagnosis Date  . Arthritis    OA  . Back pain   . Bilateral swelling of feet   . Blood transfusion 1992   AFTER  SURGERY TO REMOVE BLADDER  . Cataract   . 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 COLONIC POLYPS 07/11/2010  . Constipation   . Diabetes mellitus    ORAL MED FOR DIABETES  . Gallbladder problem   . GERD (gastroesophageal reflux disease)    PAST HISTORY GERD--WATCHES DIET - NOT ON ANY MEDS FOR GERD  . HYPERLIPIDEMIA 11/29/2009  . HYPERTENSION 11/29/2009  . Incisional hernia    NO PAIN-NOT CAUSING ANY DISCOMFORT  . Kidney infection   . Osteoarthritis   . Osteomyelitis ([place])   . PONV (postoperative nausea and vomiting)   . 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[place])    11-28-14 remains  . Shortness of breath   . Stroke (cerebrum) [facility])    Past Surgical History:  Procedure Laterality Date  . 1992    . ABDOMINAL HYSTERECTOMY  1990   partial  . ACHILLES TENDON LENGTHENING  1962  . ADENOIDECTOMY  1950   . APPENDECTOMY    . CARPAL TUNNEL RELEASE  1983  . CHOLECYSTECTOMY    . COLONOSCOPY WITH PROPOFOL N/A 12/07/2014   Procedure: COLONOSCOPY WITH PROPOFOL;  Surgeon: Gatha Mayer, MD;  Location: [facility];  Service: Endoscopy;  Laterality: N/A;  . LEFT CARPAL TUNNEL RELEASE X 2    . LUMBAR SYMPATHETECTOMIES X 2    . MULTIPLE RIGHT ANKLE SURGERIES    . NEPHROLITHOTOMY  10/01/2011   Procedure: NEPHROLITHOTOMY PERCUTANEOUS;  Surgeon: Franchot Gallo, MD;  Location: [facility];  Service: Urology;  Laterality: Right;       . PARTIAL AMPUTATION OF LEFT GREAT TOE     1985  . radical cystectomy  1992   neurogenic bladder sec CP  . REPAIR OF NASAL FRACTURE  1985  . REVISION UROSTOMY CUTANEOUS  1992  . RIGHT CARPAL TUNNEL RELEASE    . RIGHT HIP FLEXION CONTRACTURE REPAIR  1986  . RT ANKLE FUSION  1975   SEVERAL RT ANKLE SURGERIES PRIOR TO THE FUSION  . 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 TONSILLECTOMY    . TUBAL LIGATION  1975  . VAGINAL HYSTERECTOMY  1990  . [place]    reports that she has never smoked. She  has never used smokeless tobacco. She reports that she does not drink alcohol or use drugs. family history includes Alcoholism in her father and mother; Bladder Cancer in her father; Cancer in her father; Colon cancer (age of onset: 1) in her father; Colon polyps in her sister; Diabetes in her father; Emphysema in her father; Heart attack in her maternal grandfather; Heart disease in her father and maternal grandmother; Heart disease (age of onset: 7) in her mother; Hyperlipidemia in her father and mother; Hypertension in her mother; Liver disease in her father and mother; Sleep apnea in her father. Allergies  Allergen Reactions  . Morphine Shortness Of Breath  . Onglyza [Saxagliptin] Other (See Comments)    Headache within 45 minutes of taking this.  . Baclofen     GI upset  . Lisinopril Cough  . Metoclopramide Hcl     Reglan, irritable  .  Penicillins     " MAJOR CASE OF HIVES"     Review of Systems  Constitutional: Negative for fatigue.  Eyes: Negative for visual disturbance.  Respiratory: Negative for cough, chest tightness, shortness of breath and wheezing.   Cardiovascular: Negative for chest pain, palpitations and leg swelling.  Neurological: Negative for dizziness, seizures, syncope, weakness, light-headedness and headaches.       Objective:   Physical Exam     Assessment:     #1 poorly controlled type 2 diabetes with A1c today up to 7.4%.  This is an increase from her last visit was 6.6%  #2 bilateral leg edema which is chronic and suspected largely due to venous stasis  #3 acute renal insufficiency by most recent labs with creatinine up to 1.53 with baseline apparently around 1.2.  She is on high-dose furosemide 80 mg twice daily  Plan:     -Long discussion with patient regarding options.  We discussed SGLT2 class but she has had history of recurrent yeast infections and UTIs and is probably not a great candidate for that reason.  We discussed GLP-1 class which could assist her with weight loss.  She does not have any history of contraindications such as pancreatitis, medullary thyroid cancer, or family history of multiple endocrine neoplasia -Start Trulicity [id number] mg subcutaneous once weekly. -Return this Thursday for lab with basic metabolic panel to reassess creatinine and electrolytes -Continue leg elevation and compression  [name] MD [facility] at [facility]

## 2019-08-12 ENCOUNTER — Other Ambulatory Visit: Payer: Self-pay

## 2019-08-12 ENCOUNTER — Other Ambulatory Visit: Payer: Self-pay | Admitting: Family Medicine

## 2019-08-12 MED ORDER — METFORMIN HCL 500 MG PO TABS: ORAL_TABLET | ORAL | 1 refills | Status: DC

## 2019-08-26 DIAGNOSIS — Z936 Other artificial openings of urinary tract status: Secondary | ICD-10-CM | POA: Diagnosis not present

## 2019-09-26 DIAGNOSIS — Z936 Other artificial openings of urinary tract status: Secondary | ICD-10-CM | POA: Diagnosis not present

## 2019-10-18 ENCOUNTER — Other Ambulatory Visit: Payer: Self-pay | Admitting: *Deleted

## 2019-10-18 ENCOUNTER — Telehealth: Payer: Self-pay | Admitting: Family Medicine

## 2019-10-18 DIAGNOSIS — E781 Pure hyperglyceridemia: Secondary | ICD-10-CM

## 2019-10-18 MED ORDER — SIMVASTATIN 40 MG PO TABS: 40.0000 mg | ORAL_TABLET | Freq: Every day | ORAL | 0 refills | Status: DC

## 2019-10-18 NOTE — Telephone Encounter (Signed)
Pt would like a refill on:  Simvastatin 40mg  [facility] [address] [place] [id number]

## 2019-10-18 NOTE — Telephone Encounter (Signed)
Rx refill sent.

## 2019-10-28 ENCOUNTER — Other Ambulatory Visit: Payer: Self-pay

## 2019-10-31 ENCOUNTER — Other Ambulatory Visit: Payer: Self-pay

## 2019-10-31 ENCOUNTER — Encounter: Payer: Self-pay | Admitting: Family Medicine

## 2019-10-31 ENCOUNTER — Ambulatory Visit (INDEPENDENT_AMBULATORY_CARE_PROVIDER_SITE_OTHER): Payer: Medicare PPO | Admitting: Family Medicine

## 2019-10-31 VITALS — BP 118/66 | HR 99 | Temp 97.6°F

## 2019-10-31 DIAGNOSIS — E119 Type 2 diabetes mellitus without complications: Secondary | ICD-10-CM | POA: Diagnosis not present

## 2019-10-31 DIAGNOSIS — I10 Essential (primary) hypertension: Secondary | ICD-10-CM

## 2019-10-31 DIAGNOSIS — I878 Other specified disorders of veins: Secondary | ICD-10-CM | POA: Diagnosis not present

## 2019-10-31 LAB — BASIC METABOLIC PANEL
BUN: 42 mg/dL — ABNORMAL HIGH (ref 6–23)
CO2: 16 mEq/L — ABNORMAL LOW (ref 19–32)
Calcium: 9.1 mg/dL (ref 8.4–10.5)
Chloride: 109 mEq/L (ref 96–112)
Creatinine, Ser: 1.41 mg/dL — ABNORMAL HIGH (ref 0.40–1.20)
GFR: 36.32 mL/min — ABNORMAL LOW (ref >60.00)
Glucose, Bld: 141 mg/dL — ABNORMAL HIGH (ref 70–99)
Potassium: 4.5 mEq/L (ref 3.5–5.1)
Sodium: 136 mEq/L (ref 135–145)

## 2019-10-31 LAB — POCT GLYCOSYLATED HEMOGLOBIN (HGB A1C): Hemoglobin A1C: 6.5 % — AB (ref 4.0–5.6)

## 2019-10-31 NOTE — Progress Notes (Signed)
Subjective:     Patient ID: John Giovanni, female   DOB: [birth year], 74 y.o.   MRN: [id number]  HPI   Shund[place]ka is here for follow-up regarding diabetes. She had A1c few months ago 7.4%. We added Trulicity and she is tolerating this with no side effects. She does have some early satiety but no significant nausea. No vomiting. She has noted when she gives this that she sometimes has somewhat of a wet sensation on the skin and she wondered if the medication was going hand. Her A1c is greatly improved today from 7.4% to 6.5% which would argue that the medication is likely having an effect  She has chronic venous stasis and is on Lasix 80 mg twice daily. Last creatinine was up to 1.5 range and it looks like her previous baseline around 1.2. She sometimes only takes 80 mg daily. She is using support hose regularly. No orthopnea. No dyspnea but she is very sedentary. Her blood pressures been very stable.  Past Medical History:  Diagnosis Date  . Arthritis    OA  . Back pain   . Bilateral swelling of feet   . Blood transfusion 1992   AFTER  SURGERY TO REMOVE BLADDER  . Cataract   . 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 COLONIC POLYPS 07/11/2010  . Constipation   . Diabetes mellitus    ORAL MED FOR DIABETES  . Gallbladder problem   . GERD (gastroesophageal reflux disease)    PAST HISTORY GERD--WATCHES DIET - NOT ON ANY MEDS FOR GERD  . HYPERLIPIDEMIA 11/29/2009  . HYPERTENSION 11/29/2009  . Incisional hernia    NO PAIN-NOT CAUSING ANY DISCOMFORT  . Kidney infection   . Osteoarthritis   . Osteomyelitis ([place])   . PONV (postoperative nausea and vomiting)   . 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[place])    11-28-14 remains  . Shortness of breath   . Stroke (cerebrum) [facility])    Past Surgical History:  Procedure Laterality Date  . 1992    . ABDOMINAL HYSTERECTOMY  1990   partial  . ACHILLES  TENDON LENGTHENING  1962  . ADENOIDECTOMY  1950  . APPENDECTOMY    . CARPAL TUNNEL RELEASE  1983  . CHOLECYSTECTOMY    . COLONOSCOPY WITH PROPOFOL N/A 12/07/2014   Procedure: COLONOSCOPY WITH PROPOFOL;  Surgeon: Gatha Mayer, MD;  [facility];  Service: Endoscopy;  Laterality: N/A;  . LEFT CARPAL TUNNEL RELEASE X 2    . LUMBAR SYMPATHETECTOMIES X 2    . MULTIPLE RIGHT ANKLE SURGERIES    . NEPHROLITHOTOMY  10/01/2011   Procedure: NEPHROLITHOTOMY PERCUTANEOUS;  Surgeon: Franchot Gallo, MD;  Location: [facility];  Service: Urology;  Laterality: Right;       . PARTIAL AMPUTATION OF LEFT GREAT TOE     1985  . radical cystectomy  1992   neurogenic bladder sec CP  . REPAIR OF NASAL FRACTURE  1985  . REVISION UROSTOMY CUTANEOUS  1992  . RIGHT CARPAL TUNNEL RELEASE    . RIGHT HIP FLEXION CONTRACTURE REPAIR  1986  . RT ANKLE FUSION  1975   SEVERAL RT ANKLE SURGERIES PRIOR TO THE FUSION  . 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 TONSILLECTOMY    . TUBAL LIGATION  1975  . VAGINAL HYSTERECTOMY  1990  . [place]  reports that she has never smoked. She has never used smokeless tobacco. She reports that she does not drink alcohol or use drugs. family history includes Alcoholism in her father and mother; Bladder Cancer in her father; Cancer in her father; Colon cancer (age of onset: 56) in her father; Colon polyps in her sister; Diabetes in her father; Emphysema in her father; Heart attack in her maternal grandfather; Heart disease in her father and maternal grandmother; Heart disease (age of onset: 72) in her mother; Hyperlipidemia in her father and mother; Hypertension in her mother; Liver disease in her father and mother; Sleep apnea in her father. Allergies  Allergen Reactions  . Morphine Shortness Of Breath  . Onglyza [Saxagliptin] Other (See Comments)    Headache within 45 minutes of taking this.  . Baclofen     GI upset  . Lisinopril Cough   . Metoclopramide Hcl     Reglan, irritable  . Penicillins     " MAJOR CASE OF HIVES"     Review of Systems  Constitutional: Negative for fatigue.  Eyes: Negative for visual disturbance.  Respiratory: Negative for cough, chest tightness, shortness of breath and wheezing.   Cardiovascular: Positive for leg swelling. Negative for chest pain and palpitations.  Endocrine: Negative for polydipsia and polyuria.  Neurological: Negative for dizziness, seizures, syncope, weakness, light-headedness and headaches.       Objective:   Physical Exam Vitals reviewed.  Constitutional:      Appearance: Normal appearance.  Cardiovascular:     Rate and Rhythm: Normal rate and regular rhythm.  Pulmonary:     Effort: Pulmonary effort is normal.     Breath sounds: Normal breath sounds.  Musculoskeletal:     Comments: She has support hose on bilaterally and these were not removed today.  Neurological:     Mental Status: She is alert.        Assessment:     #1 type 2 diabetes greatly improved with recent addition of Trulicity. A1c reduction from 7.4% to 6.5%  #2 acute renal insufficiency based on most recent labs. Question prerenal. Needs follow-up basic metabolic panel and possible adjustment in Lasix dose if not improved  #3 hypertension stable and at goal    Plan:     -As above, check basic metabolic panel -consider reducing Lasix based on basic metabolic panel results -She had questions regarding administration of Trulicity.  It would appear that she is administering this effectively based on improvement in A1c after starting this.  However, we gave her option of coming in the office and scheduling follow-up next time she is due for injection to have one of the nurses observed to make sure she is doing this appropriately  [name] MD [facility] at [facility]

## 2019-10-31 NOTE — Patient Instructions (Signed)
Your A1C is greatly improved at 6.5%  We will call you with lab results.  May be backing off the lasix dose.

## 2019-11-01 ENCOUNTER — Telehealth: Payer: Self-pay | Admitting: Family Medicine

## 2019-11-01 NOTE — Chronic Care Management (AMB) (Signed)
  Chronic Care Management   Note  11/01/2019 Name: Jordan [name] MRN: [id number] DOB: 03-Jul-1945  Jordan [name] is a 74 y.o. year old female who is a primary care patient of Burchette, Alinda Sierras, MD. I reached out to John Giovanni by phone today in response to a referral sent by Ms. Barnett Hatter PCP, [name], MD.   [name] was given information about Chronic Care Management services today including:  1. CCM service includes personalized support from designated clinical staff supervised by her physician, including individualized plan of care and coordination with other care providers 2. 24/7 contact phone numbers for assistance for urgent and routine care needs. 3. Service will only be billed when office clinical staff spend 20 minutes or more in a month to coordinate care. 4. Only one practitioner may furnish and bill the service in a calendar month. 5. The patient may stop CCM services at any time (effective at the end of the month) by phone call to the office staff.   Patient agreed to services and verbal consent obtained.   Follow up plan:  [place]

## 2019-11-02 ENCOUNTER — Telehealth: Payer: Self-pay | Admitting: Family Medicine

## 2019-11-02 ENCOUNTER — Other Ambulatory Visit: Payer: Self-pay

## 2019-11-02 MED ORDER — FUROSEMIDE 80 MG PO TABS: ORAL_TABLET | ORAL | 1 refills | Status: DC

## 2019-11-02 NOTE — Telephone Encounter (Signed)
Refill sent in

## 2019-11-02 NOTE — Telephone Encounter (Signed)
Pt's spouse, Marcello Moores, stated the pharmacy has tried three times to get furosemide refilled and no one has responded. She is completely out.   Medication Refill:  Furosemide  Pharmacy: [facility] [address]  Fax: [phone]

## 2019-11-18 ENCOUNTER — Telehealth: Payer: Self-pay | Admitting: Family Medicine

## 2019-11-18 NOTE — Telephone Encounter (Signed)
Placed in red folder  

## 2019-11-18 NOTE — Telephone Encounter (Signed)
done

## 2019-11-18 NOTE — Telephone Encounter (Signed)
Pt dropped off a [place] [employer] of Motor Vehicle-Disability Parking Placard form. Form is placed in red folder for PCP.   Pt would like to be called at [phone] upon completion and pick it up.

## 2019-11-21 NOTE — Telephone Encounter (Signed)
Pt has been informed that form is ready for pick up

## 2019-11-29 ENCOUNTER — Other Ambulatory Visit: Payer: Self-pay

## 2019-11-29 DIAGNOSIS — E119 Type 2 diabetes mellitus without complications: Secondary | ICD-10-CM

## 2019-11-29 DIAGNOSIS — I10 Essential (primary) hypertension: Secondary | ICD-10-CM

## 2019-11-29 NOTE — Telephone Encounter (Signed)
Referral has been placed. 

## 2019-11-30 ENCOUNTER — Other Ambulatory Visit: Payer: Self-pay | Admitting: Family Medicine

## 2019-11-30 ENCOUNTER — Telehealth: Payer: Self-pay | Admitting: Family Medicine

## 2019-11-30 MED ORDER — LOSARTAN POTASSIUM 100 MG PO TABS: 100.0000 mg | ORAL_TABLET | Freq: Every day | ORAL | 1 refills | Status: DC

## 2019-11-30 NOTE — Chronic Care Management (AMB) (Signed)
Chronic Care Management Pharmacy  Name: Jordan [name]  MRN: [id number] DOB: 06-20-1945  Initial Questions: 1. Have you seen any other providers since your last visit? NA 2. Any changes in your medicines or health? No   Chief Complaint/ HPI Jordan [name],  74 y.o. , female presents for their Initial CCM visit with the clinical pharmacist via telephone due to COVID-19 Pandemic.  Patient reports doing well and has been able to continue job virtually instead of going to nursing home (as she had done previously).   She endorses having cerebral palsy and has a "handicap" vehicle where she is able to drive with her hands since she is in a wheelchair. Denies issues with transportation.   PCP : [name], MD  Their chronic conditions include: HTN, DM, HLD, bilateral leg edema, neuropathic pain, constipation, allergic rhinitis  Office Visits: 10/31/2019- Carolann Littler, MD- Patient presented for office visit for diabetes follow up. A1c improved from 7.4 to 6.5% with recent addition of Trulicity. Patient to return in 3 months.   Result note (10/31/19): instructed to decrease furosemide to 52m twice daily and recheck BMP in 3 months.   08/01/2019- BCarolann Littler MD- Patient presented for office visit for follow up. A1c increased from 6.6 to 7.4%. patient started on Trulicity 07.86LJonce weekly. Patient to continue leg elevation and compression for swelling. Patient to come in for BMET to reassess creatinine and electrolytes.   Consult Visit: 07/29/2019- Urology- SFranchot Gallo Patient presented for office visit for calculus of kidney and history of UTI. Unable to access notes.   Medications: Outpatient Encounter Medications as of 12/01/2019  Medication Sig  . Accu-Chek FastClix Lancets MISC Use to check blood sugar once daily. Dx Code E11.65  . acetaminophen (TYLENOL) 500 MG tablet Take 500 mg by mouth every 6 (six) hours as needed for mild pain. Pain   . aspirin 81 MG tablet Take  81 mg by mouth daily.   . Blood Glucose Monitoring Suppl (ACCU-CHEK NANO SMARTVIEW) w/Device KIT Use to check blood glucose 2 times daily.  . cetirizine (ZYRTEC) 10 MG tablet Take 1 tablet (10 mg total) by mouth daily as needed. allergies  . Cholecalciferol (VITAMIN D) 2000 UNITS CAPS Take 2,000 Units by mouth. Takes only on days Monday through Thursday  . Dulaglutide (TRULICITY) 04.49MEE/1.0OFSOPN Inject 0.75 mg into the skin once a week.  . furosemide (LASIX) 80 MG tablet TAKE 1 TABLET(80 MG) BY MOUTH TWICE DAILY (Patient taking differently: 40 mg 2 (two) times daily. )  . gabapentin (NEURONTIN) 100 MG capsule TAKE ONE TO TWO CAPSULES BY MOUTH AT NIGHT AS NEEDED FOR NEUROPATHIC PAIN.  . glucose blood (ACCU-CHEK SMARTVIEW) test strip USE TO CHECK BLOOD GLUCOSE TWICE DAILY AS DIRECTED  . Multiple Vitamin (MULTIVITAMIN) capsule Take 1 capsule by mouth daily.   . Omega-3 Fatty Acids (FISH OIL) 1200 MG CAPS Take 1,200 mg by mouth 2 (two) times daily.   . polyethylene glycol powder (GLYCOLAX/MIRALAX) powder Take 17 g by mouth daily as needed. One capfull daily by mouth with liquid  . potassium citrate (UROCIT-K) 10 MEQ (1080 MG) SR tablet Take 10 mEq by mouth 3 (three) times daily with meals.  . senna (SENOKOT) 8.6 MG tablet Take 1 tablet by mouth daily as needed.   . simvastatin (ZOCOR) 40 MG tablet Take 1 tablet (40 mg total) by mouth daily.  . sodium chloride (OCEAN) 0.65 % nasal spray Place 1-2 sprays into the nose as needed. Allergies   . [  DISCONTINUED] losartan (COZAAR) 100 MG tablet Take 1 tablet (100 mg total) by mouth daily.  . [DISCONTINUED] metFORMIN (GLUCOPHAGE) 500 MG tablet TAKE 2 TABLETS BY MOUTH EVERY MORNING AND 2 TABLETS BY MOUTH EVERY EVENING   No facility-administered encounter medications on file as of 12/01/2019.     Current Diagnosis/Assessment:  Goals Addressed            This Visit's Progress   . Pharmacy Care Plan       CARE PLAN ENTRY (see longitudinal plan of  care for additional care plan information)  Current Barriers:  . Chronic Disease Management support, education, and care coordination needs related to Hypertension, Hyperlipidemia, Diabetes, and Edema    Hypertension BP Readings from Last 3 Encounters:  10/31/19 118/66  08/01/19 112/72  04/26/19 118/64   . Pharmacist Clinical Goal(s): o Over the next 180 days, patient will work with PharmD and providers to maintain BP goal <130/80 . Current regimen:  o Losartan 173m, 1 tablet once daily . Patient self care activities - Over the next 180 days, patient will: o Check BP at home, document, and provide at future appointments o Ensure daily salt intake < 2300 mg/day  Hyperlipidemia Lab Results  Component Value Date/Time   LDLCALC 60 01/26/2019 03:45 PM   LDLDIRECT 75.0 01/09/2017 02:48 PM   . Pharmacist Clinical Goal(s): o Over the next 180 days, patient will work with PharmD and providers to maintain LDL goal < 100 . Current regimen:  . Simvastatin 415m 1 tablet once daily  . Omega-3 120076m1 capsule twice daily  . Patient self care activities - Over the next 180 days, patient will: o Continue current medications as instructed.   Diabetes Lab Results  Component Value Date/Time   HGBA1C 6.5 (A) 10/31/2019 10:50 AM   HGBA1C 7.4 (A) 08/01/2019 11:13 AM   HGBA1C 6.8 (H) 12/22/2018 09:11 AM   HGBA1C 6.9 (H) 01/04/2016 11:20 AM   . Pharmacist Clinical Goal(s): o Over the next 180 days, patient will work with PharmD and providers to maintain A1c goal <7% . Current regimen:   dulaglutide (Trulicity) inject 0.78.91QXce a week   Metformin 500m16m tablets every morning and 2 tablets in the evening . Interventions: o We discussed how each metformin and Trulicity work differently in the body and both work on controlled blood sugars.  . Patient self care activities - Over the next 180 days, patient will: o Check blood sugar twice daily, document, and provide at future  appointments  Edema . Pharmacist Clinical Goal(s) o Over the next 180 days, patient will work with PharmD and providers to minimize leg edema.   . Current regimen:  o Furosemide 40mg19mtablet twice daily (recent dose decrease due to kidney function).  . Patient self care activities - Over the next 180 days, patient will: o Continue current medications and follow up on 01/31/2020 to see how kidney function is doing.    Medication management . Pharmacist Clinical Goal(s): o Over the next 180 days, patient will work with PharmD and providers to achieve optimal medication adherence . Current pharmacy: [facility] . Interventions o Comprehensive medication review performed. o Continue current medication management strategy . Patient self care activities - Over the next 180 days, patient will: o Take medications as prescribed o Report any questions or concerns to PharmD and/or provider(s)  Initial goal documentation       SDOH Interventions     Most Recent Value  SDOH Interventions  Financial Strain  Interventions Intervention Not Indicated  Transportation Interventions Intervention Not Indicated       Diabetes  Patient expressed concern taking 2 medications for diabetes and blood sugars have improved but is not sure if it is due to metformin and not Trulicity.   Recent Relevant Labs:  Lab Results  Component Value Date/Time   HGBA1C 6.5 (A) 10/31/2019 10:50 AM   HGBA1C 7.4 (A) 08/01/2019 11:13 AM   HGBA1C 6.8 (H) 12/22/2018 09:11 AM   HGBA1C 6.9 (H) 01/04/2016 11:20 AM   MICROALBUR 84.1 (H) 12/31/2010 09:03 AM    Checking BG: 2x per Day  Recent FBG Readings: 110  Recent HS BG readings: 110  Patient has failed these meds in past: Victoza (cost)   Patient is currently controlled on the following medications:   dulaglutide (Trulicity) inject 3.76EG once a week (appropriate, effective, safe, accessible).   Metformin 54m, 2 tablets every morning and 2 tablets in the  evening (appropriate, effective, not safe)  Last diabetic Eye exam:  Lab Results  Component Value Date/Time   HMDIABEYEEXA No Retinopathy 04/18/2019 12:00 AM    Last diabetic Foot exam:  Lab Results  Component Value Date/Time   HMDIABFOOTEX normal 12/20/2014 12:00 AM    We discussed:   Mechanism of action of both metformin and dulaglutide (Trulicity) and how both are working to keep BGs controlled.   Plan Recommend dose decrease of metformin to 5067mtwice daily due to kidney function (patient getting repeat BMP on 01/31/2020.    Hypertension  Denies dizziness/ lightheadedness   BP today is:  <130/80  Office blood pressures are  BP Readings from Last 3 Encounters:  10/31/19 118/66  08/01/19 112/72  04/26/19 118/64   Patient has failed these meds in the past: none  Patient home BP readings are ranging: 115/ 60-70  Patient is controlled on:   Losartan 10055m1 tablet once daily (appropriate, effective, safe, accessible)   Plan Continue current medications   Hyperlipidemia   LDL goal < 100   Lipid Panel     Component Value Date/Time   CHOL 131 01/26/2019 1545   TRIG 171 (H) 01/26/2019 1545   HDL 37 (L) 01/26/2019 1545   LDLCALC 60 01/26/2019 1545   LDLDIRECT 75.0 01/09/2017 1448    Hepatic Function Latest Ref Rng & Units 12/22/2018 10/09/2017 01/09/2017  Total Protein 6.0 - 8.3 g/dL 7.0 6.9 6.7  Albumin 3.5 - 5.2 g/dL 4.0 4.0 3.9  AST 0 - 37 U/L '8 13 17  ' ALT 0 - 35 U/L '13 20 25  ' Alk Phosphatase 39 - 117 U/L 109 72 79  Total Bilirubin 0.2 - 1.2 mg/dL 0.5 0.3 0.3  Bilirubin, Direct 0.0 - 0.3 mg/dL 0.1 0.1 0.1     The 10-year ASCVD risk score (GoMikey Bussing Jr., et al., 2013) is: 31%   Values used to calculate the score:     Age: [age]     Sex: Female     Is Non-Hispanic African American: No     Diabetic: Yes     Tobacco smoker: No     Systolic Blood Pressure: 118315Hg     Is BP treated: Yes     HDL Cholesterol: 37 mg/dL     Total Cholesterol: 131 mg/dL    Patient has failed these meds in past: none Patient is currently controlled on the following medications:  . Simvastatin 96m6m tablet once daily (moderate intensity)  . Omega-3 1200mg67mcapsule twice daily   Plan Continue  current medications  Edema    Patient reports furosemide has helping some. She notes she is not as  swollen on the foot, but notes slight swelling from knees to ankles. Patient endorses using compression socks and attempts leg elevation mostly in the evening.   Kidney Function Lab Results  Component Value Date/Time   CREATININE 1.41 (H) 10/31/2019 11:08 AM   CREATININE 1.53 (H) 01/11/2019 09:22 AM   GFR 36.32 (L) 10/31/2019 11:08 AM   GFRNONAA 37 (L) 01/08/2016 09:38 PM   GFRAA 43 (L) 01/08/2016 09:38 PM   K 4.5 10/31/2019 11:08 AM   K 4.0 01/11/2019 09:22 AM    Patient has failed these meds in past: none  Patient is currently controlled on the following medications:   Furosemide 82m, 1 tablet twice daily  Potassium supplementation: (history of kidney stones)   Potassium citrate 134m, 1 tablet three times daily with meals   Plan Continue current medications  Primary prevention ASCVD    The 10-year ASCVD risk score (GMikey BussingC Jr., et al., 2013) is: 31%  Patient is currently controlled on the following medications:  . Aspirin 8141m1 tablet once daily   Plan Continue current medications  Neuropathic pain    Patient has failed these meds in past: none Patient is currently controlled on the following medications:  . Gabapentin 100m67m capsules at night as needed for neuropathic pain   Plan Continue current medications  Bone Health   Patient reports having a bone density scan in the past o recent scans. Reports having in the past, but has had in the past.   Last DEXA Scan: none found in chart; orders were discontinue with [facility]: VD25[facility]   Unable to determine bone health  status.   Patient is currently on the following medications:   Cholecalciferol (vitamin D) 2000 units, 1 capsule on Monday through Thursday   We discussed:  Obtaining bone density every 2-3 years.   Plan Patient to review records of last bone density scan Reassess at follow up visit.   Allergic rhinitis (query)    Patient is currently controlled on the following medications:  . Cetirizine 10mg64mtablet once daily as needed for allergies . Sodium chloride 0.65% nasal spray, instill 1 to 2 sprays into nose as needed for allergies   Plan Continue current medications  Pain    Patient reports taking when pain comes from increased leg swelling.   Patient has failed these meds in past: none  Patient is currently controlled on the following medications:  . Acetaminophen 500mg,36mablet every six hours as needed for mild pain   We discussed:  The maximum daily dose of acetaminophen was discussed with the patient. She was encouraged not to exceed 4,000 mg of acetaminophen during a 24 hour period and was asked to keep in mind that acetaminophen can also be found in many over-the-counter cold medications as well as narcotics that may be given for pain. The patient expresses understanding of these issues and questions were answered.  Plan Continue current medications  Constipation  Patient reports for most part, does not have daily constipation. Will take the following as needed.    Patient has failed these meds in past: none  Patient is currently controlled on the following medications:  . MiraMarland Kitchenax, 17 g once daily (PRN)  . Senna 8.6mg, 111mblet once daily (PRN)   Plan Continue current medications  OTC/  supplements    Patient is currently on the following medications:  Marland Kitchen Multivitamin, 1 capsule once daily   Plan Continue current medications   Medication Management  Patient organizes medications: patient uses pill box (AM and PM)  Primary pharmacy: [facility]   Adherence: per medication dispense history (from 06/03/19 to 11/29/19)   losartan 111m (last filled 08/12/19 for 90DS)  Patient reports having difficulties getting this refilled. Recently refilled 11/30/19   potassium citrate 10Meq (last filled 07/31/19 for 90DS)   Patient reports complete adherence. Has 5 pills left and will be getting refill soon.   Follow-up Follow up visit with PharmD in 6 months    AAnson Crofts PharmD Clinical Pharmacist [phone]

## 2019-11-30 NOTE — Telephone Encounter (Signed)
Pt informed medication sent in.

## 2019-11-30 NOTE — Telephone Encounter (Signed)
[facility] [place], [address] [place] Phone:  [phone]  Fax:  [phone]     losartan (COZAAR) 100 MG tablet  Patient is completely out of this Rx and the pharmacy keeps telling the patient that they have sent a refill request to her doctor but not getting a respond.

## 2019-12-01 ENCOUNTER — Ambulatory Visit: Payer: Medicare PPO

## 2019-12-01 ENCOUNTER — Other Ambulatory Visit: Payer: Self-pay

## 2019-12-01 DIAGNOSIS — E785 Hyperlipidemia, unspecified: Secondary | ICD-10-CM

## 2019-12-01 DIAGNOSIS — E119 Type 2 diabetes mellitus without complications: Secondary | ICD-10-CM

## 2019-12-01 DIAGNOSIS — I10 Essential (primary) hypertension: Secondary | ICD-10-CM

## 2019-12-01 DIAGNOSIS — R600 Localized edema: Secondary | ICD-10-CM

## 2019-12-01 NOTE — Patient Instructions (Addendum)
Visit Information  Goals Addressed            This Visit's Progress   . Pharmacy Care Plan       CARE PLAN ENTRY (see longitudinal plan of care for additional care plan information)  Current Barriers:  . Chronic Disease Management support, education, and care coordination needs related to Hypertension, Hyperlipidemia, Diabetes, and Edema    Hypertension BP Readings from Last 3 Encounters:  10/31/19 118/66  08/01/19 112/72  04/26/19 118/64   . Pharmacist Clinical Goal(s): o Over the next 180 days, patient will work with PharmD and providers to maintain BP goal <130/80 . Current regimen:  o Losartan 100mg , 1 tablet once daily . Patient self care activities - Over the next 180 days, patient will: o Check BP at home, document, and provide at future appointments o Ensure daily salt intake < 2300 mg/day  Hyperlipidemia Lab Results  Component Value Date/Time   LDLCALC 60 01/26/2019 03:45 PM   LDLDIRECT 75.0 01/09/2017 02:48 PM   . Pharmacist Clinical Goal(s): o Over the next 180 days, patient will work with PharmD and providers to maintain LDL goal < 100 . Current regimen:  . Simvastatin 40mg , 1 tablet once daily  . Omega-3 1200mg , 1 capsule twice daily  . Patient self care activities - Over the next 180 days, patient will: o Continue current medications as instructed.   Diabetes Lab Results  Component Value Date/Time   HGBA1C 6.5 (A) 10/31/2019 10:50 AM   HGBA1C 7.4 (A) 08/01/2019 11:13 AM   HGBA1C 6.8 (H) 12/22/2018 09:11 AM   HGBA1C 6.9 (H) 01/04/2016 11:20 AM   . Pharmacist Clinical Goal(s): o Over the next 180 days, patient will work with PharmD and providers to maintain A1c goal <7% . Current regimen:   dulaglutide (Trulicity) inject 0.75mg  once a week   Metformin 500mg , 2 tablets every morning and 2 tablets in the evening . Interventions: o We discussed how each metformin and Trulicity work differently in the body and both work on controlled blood sugars.   . Patient self care activities - Over the next 180 days, patient will: o Check blood sugar twice daily, document, and provide at future appointments  Edema . Pharmacist Clinical Goal(s) o Over the next 180 days, patient will work with PharmD and providers to minimize leg edema.   . Current regimen:  o Furosemide 40mg , 1 tablet twice daily (recent dose decrease due to kidney function).  . Patient self care activities - Over the next 180 days, patient will: o Continue current medications and follow up on 01/31/2020 to see how kidney function is doing.    Medication management . Pharmacist Clinical Goal(s): o Over the next 180 days, patient will work with PharmD and providers to achieve optimal medication adherence . Current pharmacy: [facility] . Interventions o Comprehensive medication review performed. o Continue current medication management strategy . Patient self care activities - Over the next 180 days, patient will: o Take medications as prescribed o Report any questions or concerns to PharmD and/or provider(s)  Initial goal documentation        Ms. Brees was given information about Chronic Care Management services today including:  1. CCM service includes personalized support from designated clinical staff supervised by her physician, including individualized plan of care and coordination with other care providers 2. 24/7 contact phone numbers for assistance for urgent and routine care needs. 3. Standard insurance, coinsurance, copays and deductibles apply for chronic care management only during months in  which we provide at least 20 minutes of these services. Most insurances cover these services at 100%, however patients may be responsible for any copay, coinsurance and/or deductible if applicable. This service may help you avoid the need for more expensive face-to-face services. 4. Only one practitioner may furnish and bill the service in a calendar month. 5. The patient may  stop CCM services at any time (effective at the end of the month) by phone call to the office staff.  Patient agreed to services and verbal consent obtained.   The patient verbalized understanding of instructions provided today and agreed to receive a mailed copy of patient instruction and/or educational materials. Telephone follow up appointment with pharmacy team member scheduled for: 06/01/2020  Anson Crofts, PharmD Clinical Pharmacist [facility] [phone]    Elmer stands for "Dietary Approaches to Stop Hypertension." The DASH eating plan is a healthy eating plan that has been shown to reduce high blood pressure (hypertension). It may also reduce your risk for type 2 diabetes, heart disease, and stroke. The DASH eating plan may also help with weight loss. What are tips for following this plan?  General guidelines  Avoid eating more than 2,300 mg (milligrams) of salt (sodium) a day. If you have hypertension, you may need to reduce your sodium intake to 1,500 mg a day.  Limit alcohol intake to no more than 1 drink a day for nonpregnant women and 2 drinks a day for men. One drink equals 12 oz of beer, 5 oz of wine, or 1 oz of hard liquor.  Work with your health care provider to maintain a healthy body weight or to lose weight. Ask what an ideal weight is for you.  Get at least 30 minutes of exercise that causes your heart to beat faster (aerobic exercise) most days of the week. Activities may include walking, swimming, or biking.  Work with your health care provider or diet and nutrition specialist (dietitian) to adjust your eating plan to your individual calorie needs. Reading food labels   Check food labels for the amount of sodium per serving. Choose foods with less than 5 percent of the Daily Value of sodium. Generally, foods with less than 300 mg of sodium per serving fit into this eating plan.  To find whole grains, look for  the word "whole" as the first word in the ingredient list. Shopping  Buy products labeled as "low-sodium" or "no salt added."  Buy fresh foods. Avoid canned foods and premade or frozen meals. Cooking  Avoid adding salt when cooking. Use salt-free seasonings or herbs instead of table salt or sea salt. Check with your health care provider or pharmacist before using salt substitutes.  Do not fry foods. Cook foods using healthy methods such as baking, boiling, grilling, and broiling instead.  Cook with heart-healthy oils, such as olive, canola, soybean, or sunflower oil. Meal planning  Eat a balanced diet that includes: ? 5 or more servings of fruits and vegetables each day. At each meal, try to fill half of your plate with fruits and vegetables. ? Up to 6-8 servings of whole grains each day. ? Less than 6 oz of lean meat, poultry, or fish each day. A 3-oz serving of meat is about the same size as a deck of cards. One egg equals 1 oz. ? 2 servings of low-fat dairy each day. ? A serving of nuts, seeds, or beans 5 times each week. ? Heart-healthy fats.  Healthy fats called Omega-3 fatty acids are found in foods such as flaxseeds and coldwater fish, like sardines, salmon, and mackerel.  Limit how much you eat of the following: ? Canned or prepackaged foods. ? Food that is high in trans fat, such as fried foods. ? Food that is high in saturated fat, such as fatty meat. ? Sweets, desserts, sugary drinks, and other foods with added sugar. ? Full-fat dairy products.  Do not salt foods before eating.  Try to eat at least 2 vegetarian meals each week.  Eat more home-cooked food and less restaurant, buffet, and fast food.  When eating at a restaurant, ask that your food be prepared with less salt or no salt, if possible. What foods are recommended? The items listed may not be a complete list. Talk with your dietitian about what dietary choices are best for you. Grains Whole-grain or  whole-wheat bread. Whole-grain or whole-wheat pasta. Brown rice. [place]. Bulgur. Whole-grain and low-sodium cereals. Pita bread. Low-fat, low-sodium crackers. Whole-wheat flour tortillas. Vegetables Fresh or frozen vegetables (raw, steamed, roasted, or grilled). Low-sodium or reduced-sodium tomato and vegetable juice. Low-sodium or reduced-sodium tomato sauce and tomato paste. Low-sodium or reduced-sodium canned vegetables. Fruits All fresh, dried, or frozen fruit. Canned fruit in natural juice (without added sugar). Meat and other protein foods Skinless chicken or [place]. Ground chicken or [place]. Pork with fat trimmed off. Fish and seafood. Egg whites. Dried beans, peas, or lentils. Unsalted nuts, nut butters, and seeds. Unsalted canned beans. Lean cuts of beef with fat trimmed off. Low-sodium, lean deli meat. Dairy Low-fat (1%) or fat-free (skim) milk. Fat-free, low-fat, or reduced-fat cheeses. Nonfat, low-sodium ricotta or cottage cheese. Low-fat or nonfat yogurt. Low-fat, low-sodium cheese. Fats and oils Soft margarine without trans fats. Vegetable oil. Low-fat, reduced-fat, or light mayonnaise and salad dressings (reduced-sodium). Canola, safflower, olive, soybean, and sunflower oils. Avocado. Seasoning and other foods Herbs. Spices. Seasoning mixes without salt. Unsalted popcorn and pretzels. Fat-free sweets. What foods are not recommended? The items listed may not be a complete list. Talk with your dietitian about what dietary choices are best for you. Grains Baked goods made with fat, such as croissants, muffins, or some breads. Dry pasta or rice meal packs. Vegetables Creamed or fried vegetables. Vegetables in a cheese sauce. Regular canned vegetables (not low-sodium or reduced-sodium). Regular canned tomato sauce and paste (not low-sodium or reduced-sodium). Regular tomato and vegetable juice (not low-sodium or reduced-sodium). Angie Fava. Olives. Fruits Canned fruit in a light  or heavy syrup. Fried fruit. Fruit in cream or butter sauce. Meat and other protein foods Fatty cuts of meat. Ribs. Fried meat. Berniece Salines. Sausage. Bologna and other processed lunch meats. Salami. Fatback. Hotdogs. Bratwurst. Salted nuts and seeds. Canned beans with added salt. Canned or smoked fish. Whole eggs or egg yolks. Chicken or [place] with skin. Dairy Whole or 2% milk, cream, and half-and-half. Whole or full-fat cream cheese. Whole-fat or sweetened yogurt. Full-fat cheese. Nondairy creamers. Whipped toppings. Processed cheese and cheese spreads. Fats and oils Butter. Stick margarine. Lard. Shortening. Ghee. Bacon fat. Tropical oils, such as coconut, palm kernel, or palm oil. Seasoning and other foods Salted popcorn and pretzels. Onion salt, garlic salt, seasoned salt, table salt, and sea salt. Worcestershire sauce. Tartar sauce. Barbecue sauce. Teriyaki sauce. Soy sauce, including reduced-sodium. Steak sauce. Canned and packaged gravies. Fish sauce. Oyster sauce. Cocktail sauce. Horseradish that you find on the shelf. Ketchup. Mustard. Meat flavorings and tenderizers. Bouillon cubes. Hot sauce and Tabasco sauce. Premade or  packaged marinades. Premade or packaged taco seasonings. Relishes. Regular salad dressings. Where to find more information:  National Heart, Lung, and [place]: https://wilson-eaton.com/  American Heart Association: www.heart.org Summary  The DASH eating plan is a healthy eating plan that has been shown to reduce high blood pressure (hypertension). It may also reduce your risk for type 2 diabetes, heart disease, and stroke.  With the DASH eating plan, you should limit salt (sodium) intake to 2,300 mg a day. If you have hypertension, you may need to reduce your sodium intake to 1,500 mg a day.  When on the DASH eating plan, aim to eat more fresh fruits and vegetables, whole grains, lean proteins, low-fat dairy, and heart-healthy fats.  Work with your health care provider or  diet and nutrition specialist (dietitian) to adjust your eating plan to your individual calorie needs. This information is not intended to replace advice given to you by your health care provider. Make sure you discuss any questions you have with your health care provider. Document Revised: 05/15/2017 Document Reviewed: 05/26/2016 Elsevier Patient Education  2020 [employer].

## 2019-12-02 ENCOUNTER — Other Ambulatory Visit: Payer: Self-pay | Admitting: Family Medicine

## 2019-12-02 NOTE — Telephone Encounter (Signed)
Please advise 

## 2019-12-02 NOTE — Telephone Encounter (Signed)
Dr. Burchette pt  

## 2019-12-03 NOTE — Telephone Encounter (Signed)
Refill for one year 

## 2020-01-17 ENCOUNTER — Other Ambulatory Visit: Payer: Self-pay | Admitting: Family Medicine

## 2020-01-17 DIAGNOSIS — E781 Pure hyperglyceridemia: Secondary | ICD-10-CM

## 2020-01-31 ENCOUNTER — Ambulatory Visit: Payer: Medicare PPO | Admitting: Family Medicine

## 2020-01-31 DIAGNOSIS — Z0289 Encounter for other administrative examinations: Secondary | ICD-10-CM

## 2020-02-13 ENCOUNTER — Other Ambulatory Visit: Payer: Self-pay | Admitting: Family Medicine

## 2020-02-13 DIAGNOSIS — E781 Pure hyperglyceridemia: Secondary | ICD-10-CM

## 2020-02-24 DIAGNOSIS — G809 Cerebral palsy, unspecified: Secondary | ICD-10-CM | POA: Diagnosis not present

## 2020-02-24 DIAGNOSIS — Z936 Other artificial openings of urinary tract status: Secondary | ICD-10-CM | POA: Diagnosis not present

## 2020-02-24 DIAGNOSIS — G834 Cauda equina syndrome: Secondary | ICD-10-CM | POA: Diagnosis not present

## 2020-02-28 ENCOUNTER — Ambulatory Visit (INDEPENDENT_AMBULATORY_CARE_PROVIDER_SITE_OTHER): Payer: Medicare HMO

## 2020-02-28 ENCOUNTER — Other Ambulatory Visit: Payer: Self-pay

## 2020-02-28 DIAGNOSIS — Z0000 Encounter for general adult medical examination without abnormal findings: Secondary | ICD-10-CM | POA: Diagnosis not present

## 2020-02-28 NOTE — Progress Notes (Signed)
Subjective:   Jordan [name] is a 74 y.o. female who presents for Medicare Annual (Subsequent) preventive examination.  I connected with John Giovanni  today by telephone and verified that I am speaking with the correct person using two identifiers. Location patient: home Location provider: work Persons participating in the virtual visit: patient, provider.   I discussed the limitations, risks, security and privacy concerns of performing an evaluation and management service by telephone and the availability of in person appointments. I also discussed with the patient that there may be a patient responsible charge related to this service. The patient expressed understanding and verbally consented to this telephonic visit.    Interactive audio and video telecommunications were attempted between this provider and patient, however failed, due to patient having technical difficulties OR patient did not have access to video capability.  We continued and completed visit with audio only.      Review of Systems    N/A Cardiac Risk Factors include: advanced age (>78mn, >>30women)     Objective:    Today's Vitals   There is no height or weight on file to calculate BMI.  Advanced Directives 02/28/2020 07/24/2017 01/09/2017 09/27/2016 01/08/2016 01/04/2016 10/01/2015  Does Patient Have a Medical Advance Directive? Yes Yes Yes No Yes Yes No  Type of AParamed[place]of [place] will - Living will;Healthcare Power of [place]ro[place] will - -  Does patient want to make changes to medical advance directive? No - Patient declined - - - - - -  Copy of [place] Chart? No - copy requested - No - copy requested - - No - copy requested -  Would patient like information on creating a medical advance directive? - - - No - Patient declined - - -    Current Medications (verified) Outpatient Encounter Medications as of 02/28/2020    Medication Sig  . Accu-Chek FastClix Lancets MISC Use to check blood sugar once daily. Dx Code E11.65  . acetaminophen (TYLENOL) 500 MG tablet Take 500 mg by mouth every 6 (six) hours as needed for mild pain. Pain   . aspirin 81 MG tablet Take 81 mg by mouth daily.   . Blood Glucose Monitoring Suppl (ACCU-CHEK NANO SMARTVIEW) w/Device KIT Use to check blood glucose 2 times daily.  . cetirizine (ZYRTEC) 10 MG tablet Take 1 tablet (10 mg total) by mouth daily as needed. allergies  . Cholecalciferol (VITAMIN D) 2000 UNITS CAPS Take 2,000 Units by mouth. Takes only on days Monday through Thursday  . Dulaglutide (TRULICITY) 03.38MSN/0.5LZSOPN Inject 0.75 mg into the skin once a week.  . furosemide (LASIX) 80 MG tablet TAKE 1 TABLET(80 MG) BY MOUTH TWICE DAILY (Patient taking differently: 40 mg 2 (two) times daily. )  . gabapentin (NEURONTIN) 100 MG capsule TAKE 1 TO 2 CAPSULES BY MOUTH AT NIGHT AS NEEDED FOR NEUROPATHIC PAIN  . glucose blood (ACCU-CHEK SMARTVIEW) test strip USE TO CHECK BLOOD GLUCOSE TWICE DAILY AS DIRECTED  . losartan (COZAAR) 100 MG tablet TAKE 1 TABLET BY MOUTH EVERY DAY  . metFORMIN (GLUCOPHAGE) 500 MG tablet TAKE 2 TABLETS BY MOUTH EVERY MORNING AND EVERY EVENING  . Multiple Vitamin (MULTIVITAMIN) capsule Take 1 capsule by mouth daily.   . Omega-3 Fatty Acids (FISH OIL) 1200 MG CAPS Take 1,200 mg by mouth 2 (two) times daily.   . polyethylene glycol powder (GLYCOLAX/MIRALAX) powder Take 17 g by mouth daily as needed. One capfull  daily by mouth with liquid  . potassium citrate (UROCIT-K) 10 MEQ (1080 MG) SR tablet Take 10 mEq by mouth 3 (three) times daily with meals.  . senna (SENOKOT) 8.6 MG tablet Take 1 tablet by mouth daily as needed.   . simvastatin (ZOCOR) 40 MG tablet TAKE 1 TABLET(40 MG) BY MOUTH DAILY  . sodium chloride (OCEAN) 0.65 % nasal spray Place 1-2 sprays into the nose as needed. Allergies    No facility-administered encounter medications on file as of  02/28/2020.    Allergies (verified) Morphine, Onglyza [saxagliptin], Baclofen, Lisinopril, Metoclopramide hcl, and Penicillins   History: Past Medical History:  Diagnosis Date  . Arthritis    OA  . Back pain   . Bilateral swelling of feet   . Blood transfusion 1992   AFTER  SURGERY TO REMOVE BLADDER  . Cataract   . 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 COLONIC POLYPS 07/11/2010  . Constipation   . Diabetes mellitus    ORAL MED FOR DIABETES  . Gallbladder problem   . GERD (gastroesophageal reflux disease)    PAST HISTORY GERD--WATCHES DIET - NOT ON ANY MEDS FOR GERD  . HYPERLIPIDEMIA 11/29/2009  . HYPERTENSION 11/29/2009  . Incisional hernia    NO PAIN-NOT CAUSING ANY DISCOMFORT  . Kidney infection   . Osteoarthritis   . Osteomyelitis ([place])   . PONV (postoperative nausea and vomiting)   . 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 Shortness of breath   . Stroke (cerebrum) [facility])    Past Surgical History:  Procedure Laterality Date  . 1992    . ABDOMINAL HYSTERECTOMY  1990   partial  . ACHILLES TENDON LENGTHENING  1962  . ADENOIDECTOMY  1950  . APPENDECTOMY    . CARPAL TUNNEL RELEASE  1983  . CHOLECYSTECTOMY    . COLONOSCOPY WITH PROPOFOL N/A 12/07/2014   Procedure: COLONOSCOPY WITH PROPOFOL;  Surgeon: Gatha Mayer, MD;  Location: [facility];  Service: Endoscopy;  Laterality: N/A;  . LEFT CARPAL TUNNEL RELEASE X 2    . LUMBAR SYMPATHETECTOMIES X 2    . MULTIPLE RIGHT ANKLE SURGERIES    . NEPHROLITHOTOMY  10/01/2011   Procedure: NEPHROLITHOTOMY PERCUTANEOUS;  Surgeon: Franchot Gallo, MD;  Location: [facility];  Service: Urology;  Laterality: Right;       . PARTIAL AMPUTATION OF LEFT GREAT TOE     1985  . radical cystectomy  1992   neurogenic bladder sec CP  . REPAIR OF NASAL FRACTURE  1985  . REVISION UROSTOMY CUTANEOUS  1992  . RIGHT  CARPAL TUNNEL RELEASE    . RIGHT HIP FLEXION CONTRACTURE REPAIR  1986  . RT ANKLE FUSION  1975   SEVERAL RT ANKLE SURGERIES PRIOR TO THE FUSION  . 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 TONSILLECTOMY    . TUBAL LIGATION  1975  . VAGINAL HYSTERECTOMY  1990  . WISDOM TOOTH EXTRACTION  1971   Family History  Problem Relation Age of Onset  . Heart disease Mother 51       COPD  . Hypertension Mother   . Hyperlipidemia Mother   . Liver disease Mother   . Alcoholism Mother   . Heart disease Father        COPD   . Emphysema Father   . Colon  cancer Father 69       Died at 39  . Bladder Cancer Father   . Diabetes Father   . Hyperlipidemia Father   . Cancer Father   . Liver disease Father   . Sleep apnea Father   . Alcoholism Father   . Colon polyps Sister   . Heart disease Maternal Grandmother   . Heart attack Maternal Grandfather   . Esophageal cancer Neg Hx   . Kidney disease Neg Hx    Social History   Socioeconomic History  . Marital status: Married    Spouse name: Not on file  . Number of children: 1  . Years of education: Not on file  . Highest education level: Not on file  Occupational History  . Occupation: Social worker; Retail banker  Tobacco Use  . Smoking status: Never Smoker  . Smokeless tobacco: Never Used  Vaping Use  . Vaping Use: Never used  Substance and Sexual Activity  . Alcohol use: No    Alcohol/week: 0.0 standard drinks  . Drug use: No  . Sexual activity: Not on file  Other Topics Concern  . 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 Difficulty of Paying Living Expenses: Not hard at all  Food Insecurity: No Food Insecurity  . Worried About Charity fundraiser in the Last Year: Never true  . Ran Out of Food in the Last Year: Never true  Transportation Needs: No Transportation Needs    . Lack of Transportation (Medical): No  . Lack of Transportation (Non-Medical): No  Physical Activity: Insufficiently Active  . Days of Exercise per Week: 3 days  . Minutes of Exercise per Session: 20 min  Stress: No Stress Concern Present  . Feeling of Stress : Not at all  Social Connections: Moderately Integrated  . Frequency of Communication with Friends and Family: More than three times a week  . Frequency of Social Gatherings with Friends and Family: More than three times a week  . Attends Religious Services: More than 4 times per year  . Active Member of Clubs or Organizations: No  . Attends Archivist Meetings: Never  . Marital Status: Married    Tobacco Counseling Counseling given: Not Answered   Clinical Intake:  Pre-visit preparation completed: Yes  Pain : No/denies pain     Nutritional Risks: None Diabetes: No  How often do you need to have someone help you when you read instructions, pamphlets, or other written materials from your doctor or pharmacy?: 1 - Never What is the last grade level you completed in school?: Masters Degree  Diabetic?No  Interpreter Needed?: No  Information entered by :: [place] of Daily Living In your present state of health, do you have any difficulty performing the following activities: 02/28/2020  Hearing? Y  Comment Bilateral hearing aids  Vision? N  Walking or climbing stairs? Y  Comment in wheelchair  Dressing or bathing? N  Doing errands, shopping? N  Preparing Food and eating ? N  Using the Toilet? N  In the past six months, have you accidently leaked urine? N  Do you have problems with loss of bowel control? N  Managing your Medications? N  Managing your Finances? N  Housekeeping or managing your Housekeeping? N  Some recent data might be hidden  Patient Care Team: [name], MD as PCP - General (Family Medicine) Franchot Gallo, MD as Consulting Physician  (Urology) Jari Pigg, MD as Consulting Physician (Dermatology) Constance Haw, MD as Consulting Physician (Cardiology) Earnie Larsson, [facility] as Pharmacist (Pharmacist)  Indicate any recent Medical Services you may have received from other than [facility] providers in the past year (date may be approximate).     Assessment:   This is a routine wellness examination for [place].  Hearing/Vision screen  Hearing Screening   '125Hz'  '250Hz'  '500Hz'  '1000Hz'  '2000Hz'  '3000Hz'  '4000Hz'  '6000Hz'  '8000Hz'   Right ear:           Left ear:           Vision Screening Comments: Patient states gets eyes checked every year  Dietary issues and exercise activities discussed: Current Exercise Habits: Home exercise routine, Type of exercise: stretching, Time (Minutes): 20, Frequency (Times/Week): 3, Weekly Exercise (Minutes/Week): 60, Intensity: Mild, Exercise limited by: neurologic condition(s)  Goals    . patient     Continue with weight control measures Maintain labs with med and eating well;      . Pharmacy Care Plan     CARE PLAN ENTRY (see longitudinal plan of care for additional care plan information)  Current Barriers:  . Chronic Disease Management support, education, and care coordination needs related to Hypertension, Hyperlipidemia, Diabetes, and Edema    Hypertension BP Readings from Last 3 Encounters:  10/31/19 118/66  08/01/19 112/72  04/26/19 118/64   . Pharmacist Clinical Goal(s): o Over the next 180 days, patient will work with PharmD and providers to maintain BP goal <130/80 . Current regimen:  o Losartan 167m, 1 tablet once daily . Patient self care activities - Over the next 180 days, patient will: o Check BP at home, document, and provide at future appointments o Ensure daily salt intake < 2300 mg/day  Hyperlipidemia Lab Results  Component Value Date/Time   LDLCALC 60 01/26/2019 03:45 PM   LDLDIRECT 75.0 01/09/2017 02:48 PM   . Pharmacist Clinical Goal(s): o Over the next  180 days, patient will work with PharmD and providers to maintain LDL goal < 100 . Current regimen:  . Simvastatin 453m 1 tablet once daily  . Omega-3 120039m1 capsule twice daily  . Patient self care activities - Over the next 180 days, patient will: o Continue current medications as instructed.   Diabetes Lab Results  Component Value Date/Time   HGBA1C 6.5 (A) 10/31/2019 10:50 AM   HGBA1C 7.4 (A) 08/01/2019 11:13 AM   HGBA1C 6.8 (H) 12/22/2018 09:11 AM   HGBA1C 6.9 (H) 01/04/2016 11:20 AM   . Pharmacist Clinical Goal(s): o Over the next 180 days, patient will work with PharmD and providers to maintain A1c goal <7% . Current regimen:   dulaglutide (Trulicity) inject 0.79.02IOce a week   Metformin 500m74m tablets every morning and 2 tablets in the evening . Interventions: o We discussed how each metformin and Trulicity work differently in the body and both work on controlled blood sugars.  . Patient self care activities - Over the next 180 days, patient will: o Check blood sugar twice daily, document, and provide at future appointments  Edema . Pharmacist Clinical Goal(s) o Over the next 180 days, patient will work with PharmD and providers to minimize leg edema.   . Current regimen:  o Furosemide 40mg71mtablet twice daily (recent dose decrease due to kidney function).  . Patient self care activities - Over the  next 180 days, patient will: o Continue current medications and follow up on 01/31/2020 to see how kidney function is doing.    Medication management . Pharmacist Clinical Goal(s): o Over the next 180 days, patient will work with PharmD and providers to achieve optimal medication adherence . Current pharmacy: [facility] . Interventions o Comprehensive medication review performed. o Continue current medication management strategy . Patient self care activities - Over the next 180 days, patient will: o Take medications as prescribed o Report any questions or  concerns to PharmD and/or provider(s)  Initial goal documentation     . Weight (lb) < 185 lb (83.9 kg)     Lose weight by changing diet.       Depression Screen PHQ 2/9 Scores 02/28/2020 01/26/2019 07/24/2017 01/09/2017 09/03/2016 01/04/2016 06/12/2014  PHQ - 2 Score 0 1 0 0 0 0 0  PHQ- 9 Score 0 3 - - - - -    Fall Risk Fall Risk  02/28/2020 10/31/2019 07/24/2017 01/09/2017 09/03/2016  Falls in the past year? 0 0 No No No  Number falls in past yr: 0 0 - - -  Injury with Fall? 0 0 - - -  Risk for fall due to : Impaired mobility;Impaired balance/gait - - - -  Follow up Falls evaluation completed;Falls prevention discussed Falls evaluation completed - - -    Any stairs in or around the home? No  If so, are there any without handrails? No  Home free of loose throw rugs in walkways, pet beds, electrical cords, etc? Yes  Adequate lighting in your home to reduce risk of falls? Yes   ASSISTIVE DEVICES UTILIZED TO PREVENT FALLS:  Life alert? No  Use of a cane, walker or w/c? Yes  Grab bars in the bathroom? Yes  Shower chair or bench in shower? Yes  Elevated toilet seat or a handicapped toilet? Yes     Cognitive Function: Cognitive screening not indicated based on direct observation  MMSE - Mini Mental State Exam 01/04/2016  Not completed: (No Data)        Immunizations Immunization History  Administered Date(s) Administered  . Fluad Quad(high Dose 65+) 04/26/2019  . Influenza Split 05/04/2011  . Influenza Whole 04/16/2010  . Influenza, High Dose Seasonal PF 04/15/2013, 05/14/2015, 04/10/2017  . Influenza, Seasonal, Injecte, Preservative Fre 06/01/2012  . Influenza,inj,Quad PF,6+ Mos 02/09/2014  . Influenza-Unspecified 02/29/2016  . PPD Test 01/07/2011, 01/07/2011, 01/07/2011, 01/07/2011, 01/07/2011  . Pneumococcal Conjugate-13 06/12/2014  . Pneumococcal Polysaccharide-23 06/16/2004, 01/07/2011  . Td 06/16/2005  . Tdap 06/03/2011, 09/27/2016    TDAP status: Up to date Flu  Vaccine status: Up to date Pneumococcal vaccine status: Up to date Covid-19 vaccine status: Completed vaccines  Qualifies for Shingles Vaccine? Yes   Zostavax completed No   Shingrix Completed?: No.    Education has been provided regarding the importance of this vaccine. Patient has been advised to call insurance company to determine out of pocket expense if they have not yet received this vaccine. Advised may also receive vaccine at local pharmacy or Health Dept. Verbalized acceptance and understanding.  Screening Tests Health Maintenance  Topic Date Due  . COVID-19 Vaccine (1) Never done  . FOOT EXAM  04/10/2018  . INFLUENZA VACCINE  01/15/2020  . OPHTHALMOLOGY EXAM  04/17/2020  . HEMOGLOBIN A1C  05/02/2020  . COLONOSCOPY  12/06/2024  . TETANUS/TDAP  09/28/2026  . DEXA SCAN  Completed  . Hepatitis C Screening  Completed  . PNA vac Low Risk  Adult  Completed    Health Maintenance  Health Maintenance Due  Topic Date Due  . COVID-19 Vaccine (1) Never done  . FOOT EXAM  04/10/2018  . INFLUENZA VACCINE  01/15/2020    Colorectal cancer screening: Completed 12/07/2014. Repeat every 10 years Mammogram status: Completed 09/12/2015. Repeat every year Bone Density status: Completed 02/05/2016. Results reflect: Bone density results: OSTEOPENIA. Repeat every 5 years.  Lung Cancer Screening: (Low Dose CT Chest recommended if Age 2-80 years, 30 pack-year currently smoking OR have quit w/in 15years.) does not qualify.   Lung Cancer Screening Referral: N/A  Additional Screening:  Hepatitis C Screening: does qualify; Completed 02/05/2016  Vision Screening: Recommended annual ophthalmology exams for early detection of glaucoma and other disorders of the eye. Is the patient up to date with their annual eye exam?  Yes  Who is the provider or what is the name of the office in which the patient attends annual eye exams? Dr. Herbert Deaner If pt is not established with a provider, would they like to  be referred to a provider to establish care? No .   Dental Screening: Recommended annual dental exams for proper oral hygiene  Community Resource Referral / Chronic Care Management: CRR required this visit?  No   CCM required this visit?  No      Plan:     I have personally reviewed and noted the following in the patient's chart:   . Medical and social history . Use of alcohol, tobacco or illicit drugs  . Current medications and supplements . Functional ability and status . Nutritional status . Physical activity . Advanced directives . List of other physicians . Hospitalizations, surgeries, and ER visits in previous 12 months . Vitals . Screenings to include cognitive, depression, and falls . Referrals and appointments  In addition, I have reviewed and discussed with patient certain preventive protocols, quality metrics, and best practice recommendations. A written personalized care plan for preventive services as well as general preventive health recommendations were provided to patient.     Ofilia Neas, LPN   [id number]   Nurse Notes: None

## 2020-02-28 NOTE — Patient Instructions (Signed)
Jordan [name] , Thank you for taking time to come for your Medicare Wellness Visit. I appreciate your ongoing commitment to your health goals. Please review the following plan we discussed and let me know if I can assist you in the future.   Screening recommendations/referrals: Colonoscopy: Up to date, next due 12/06/2024 Mammogram: Currently due, orders placed this visit Bone Density: up to date, next due 02/04/2021 Recommended yearly ophthalmology/optometry visit for glaucoma screening and checkup Recommended yearly dental visit for hygiene and checkup  Vaccinations: Influenza vaccine: Currently due for 74 flu shot, you may receive in office or at your local pharmacy  Pneumococcal vaccine: Completed series Tdap vaccine: Up to date, next due 09/28/2026 Shingles vaccine: Currently due for Shingrix, please contact your pharmacy to discuss cost and to receive vaccine     Advanced directives: Please bring a copy of your medical advanced directives into the office so that we may scan them into your chart   Conditions/risks identified: None   Next appointment: 12/76/2021 @ 1:00 PM with Pharmacist via telephone    Preventive Care 65 Years and Older, Female Preventive care refers to lifestyle choices and visits with your health care provider that can promote health and wellness. What does preventive care include?  A yearly physical exam. This is also called an annual well check.  Dental exams once or twice a year.  Routine eye exams. Ask your health care provider how often you should have your eyes checked.  Personal lifestyle choices, including:  Daily care of your teeth and gums.  Regular physical activity.  Eating a healthy diet.  Avoiding tobacco and drug use.  Limiting alcohol use.  Practicing safe sex.  Taking low-dose aspirin every day.  Taking vitamin and mineral supplements as recommended by your health care provider. What happens during an annual well check? The  services and screenings done by your health care provider during your annual well check will depend on your age, overall health, lifestyle risk factors, and family history of disease. Counseling  Your health care provider may ask you questions about your:  Alcohol use.  Tobacco use.  Drug use.  Emotional well-being.  Home and relationship well-being.  Sexual activity.  Eating habits.  History of falls.  Memory and ability to understand (cognition).  Work and work Statistician.  Reproductive health. Screening  You may have the following tests or measurements:  Height, weight, and BMI.  Blood pressure.  Lipid and cholesterol levels. These may be checked every 5 years, or more frequently if you are over 37 years old.  Skin check.  Lung cancer screening. You may have this screening every year starting at age 76 if you have a 30-pack-year history of smoking and currently smoke or have quit within the past 15 years.  Fecal occult blood test (FOBT) of the stool. You may have this test every year starting at age 76.  Flexible sigmoidoscopy or colonoscopy. You may have a sigmoidoscopy every 5 years or a colonoscopy every 10 years starting at age 76.  Hepatitis C blood test.  Hepatitis B blood test.  Sexually transmitted disease (STD) testing.  Diabetes screening. This is done by checking your blood sugar (glucose) after you have not eaten for a while (fasting). You may have this done every 1-3 years.  Bone density scan. This is done to screen for osteoporosis. You may have this done starting at age 76.  Mammogram. This may be done every 1-2 years. Talk to your health care provider about  how often you should have regular mammograms. Talk with your health care provider about your test results, treatment options, and if necessary, the need for more tests. Vaccines  Your health care provider may recommend certain vaccines, such as:  Influenza vaccine. This is recommended  every year.  Tetanus, diphtheria, and acellular pertussis (Tdap, Td) vaccine. You may need a Td booster every 10 years.  Zoster vaccine. You may need this after age 76.  Pneumococcal 13-valent conjugate (PCV13) vaccine. One dose is recommended after age 76.  Pneumococcal polysaccharide (PPSV23) vaccine. One dose is recommended after age 76. Talk to your health care provider about which screenings and vaccines you need and how often you need them. This information is not intended to replace advice given to you by your health care provider. Make sure you discuss any questions you have with your health care provider. Document Released: 06/29/2015 Document Revised: 02/20/2016 Document Reviewed: 04/03/2015 Elsevier Interactive Patient Education  2017 [place] Prevention in the Home Falls can cause injuries. They can happen to people of all ages. There are many things you can do to make your home safe and to help prevent falls. What can I do on the outside of my home?  Regularly fix the edges of walkways and driveways and fix any cracks.  Remove anything that might make you trip as you walk through a door, such as a raised step or threshold.  Trim any bushes or trees on the path to your home.  Use bright outdoor lighting.  Clear any walking paths of anything that might make someone trip, such as rocks or tools.  Regularly check to see if handrails are loose or broken. Make sure that both sides of any steps have handrails.  Any raised decks and porches should have guardrails on the edges.  Have any leaves, snow, or ice cleared regularly.  Use sand or salt on walking paths during winter.  Clean up any spills in your garage right away. This includes oil or grease spills. What can I do in the bathroom?  Use night lights.  Install grab bars by the toilet and in the tub and shower. Do not use towel bars as grab bars.  Use non-skid mats or decals in the tub or shower.  If  you need to sit down in the shower, use a plastic, non-slip stool.  Keep the floor dry. Clean up any water that spills on the floor as soon as it happens.  Remove soap buildup in the tub or shower regularly.  Attach bath mats securely with double-sided non-slip rug tape.  Do not have throw rugs and other things on the floor that can make you trip. What can I do in the bedroom?  Use night lights.  Make sure that you have a light by your bed that is easy to reach.  Do not use any sheets or blankets that are too big for your bed. They should not hang down onto the floor.  Have a firm chair that has side arms. You can use this for support while you get dressed.  Do not have throw rugs and other things on the floor that can make you trip. What can I do in the kitchen?  Clean up any spills right away.  Avoid walking on wet floors.  Keep items that you use a lot in easy-to-reach places.  If you need to reach something above you, use a strong step stool that has a grab bar.  Keep  electrical cords out of the way.  Do not use floor polish or wax that makes floors slippery. If you must use wax, use non-skid floor wax.  Do not have throw rugs and other things on the floor that can make you trip. What can I do with my stairs?  Do not leave any items on the stairs.  Make sure that there are handrails on both sides of the stairs and use them. Fix handrails that are broken or loose. Make sure that handrails are as long as the stairways.  Check any carpeting to make sure that it is firmly attached to the stairs. Fix any carpet that is loose or worn.  Avoid having throw rugs at the top or bottom of the stairs. If you do have throw rugs, attach them to the floor with carpet tape.  Make sure that you have a light switch at the top of the stairs and the bottom of the stairs. If you do not have them, ask someone to add them for you. What else can I do to help prevent falls?  Wear shoes  that:  Do not have high heels.  Have rubber bottoms.  Are comfortable and fit you well.  Are closed at the toe. Do not wear sandals.  If you use a stepladder:  Make sure that it is fully opened. Do not climb a closed stepladder.  Make sure that both sides of the stepladder are locked into place.  Ask someone to hold it for you, if possible.  Clearly mark and make sure that you can see:  Any grab bars or handrails.  First and last steps.  Where the edge of each step is.  Use tools that help you move around (mobility aids) if they are needed. These include:  Canes.  Walkers.  Scooters.  Crutches.  Turn on the lights when you go into a dark area. Replace any light bulbs as soon as they burn out.  Set up your furniture so you have a clear path. Avoid moving your furniture around.  If any of your floors are uneven, fix them.  If there are any pets around you, be aware of where they are.  Review your medicines with your doctor. Some medicines can make you feel dizzy. This can increase your chance of falling. Ask your doctor what other things that you can do to help prevent falls. This information is not intended to replace advice given to you by your health care provider. Make sure you discuss any questions you have with your health care provider. Document Released: 03/29/2009 Document Revised: 11/08/2015 Document Reviewed: 07/07/2014 Elsevier Interactive Patient Education  2017 [employer].

## 2020-03-13 ENCOUNTER — Encounter: Payer: Self-pay | Admitting: Family Medicine

## 2020-03-13 DIAGNOSIS — Z1231 Encounter for screening mammogram for malignant neoplasm of breast: Secondary | ICD-10-CM | POA: Diagnosis not present

## 2020-05-18 ENCOUNTER — Other Ambulatory Visit: Payer: Self-pay | Admitting: Family Medicine

## 2020-05-31 ENCOUNTER — Other Ambulatory Visit: Payer: Self-pay | Admitting: *Deleted

## 2020-05-31 MED ORDER — TRUE METRIX LEVEL 1 LOW VI SOLN: 1 refills | Status: AC

## 2020-05-31 MED ORDER — ALCOHOL SWABS PADS: MEDICATED_PAD | 3 refills | Status: AC

## 2020-05-31 MED ORDER — TRUE METRIX BLOOD GLUCOSE TEST VI STRP: ORAL_STRIP | 12 refills | Status: DC

## 2020-05-31 MED ORDER — TRUEPLUS LANCETS 28G MISC: 3 refills | Status: AC

## 2020-05-31 MED ORDER — TRUE METRIX METER DEVI: 1 refills | Status: DC

## 2020-05-31 NOTE — Telephone Encounter (Signed)
Rx done. 

## 2020-06-01 ENCOUNTER — Telehealth: Payer: Medicare HMO

## 2020-06-05 ENCOUNTER — Other Ambulatory Visit: Payer: Self-pay

## 2020-06-05 DIAGNOSIS — E781 Pure hyperglyceridemia: Secondary | ICD-10-CM

## 2020-06-05 MED ORDER — SIMVASTATIN 40 MG PO TABS: ORAL_TABLET | ORAL | 3 refills | Status: DC

## 2020-06-05 MED ORDER — GABAPENTIN 100 MG PO CAPS: ORAL_CAPSULE | ORAL | 3 refills | Status: DC

## 2020-06-05 MED ORDER — METFORMIN HCL 500 MG PO TABS
ORAL_TABLET | ORAL | 1 refills | Status: DC
Start: 2020-06-05 — End: 2021-04-03

## 2020-06-21 ENCOUNTER — Other Ambulatory Visit: Payer: Self-pay

## 2020-06-21 ENCOUNTER — Other Ambulatory Visit: Payer: Self-pay | Admitting: Family Medicine

## 2020-06-21 MED ORDER — LOSARTAN POTASSIUM 100 MG PO TABS: 100.0000 mg | ORAL_TABLET | Freq: Every day | ORAL | 1 refills | Status: DC

## 2020-06-21 MED ORDER — FUROSEMIDE 80 MG PO TABS: ORAL_TABLET | ORAL | 1 refills | Status: DC

## 2020-07-02 ENCOUNTER — Ambulatory Visit: Payer: Medicare HMO | Admitting: Pharmacist

## 2020-07-02 DIAGNOSIS — I10 Essential (primary) hypertension: Secondary | ICD-10-CM

## 2020-07-02 DIAGNOSIS — E785 Hyperlipidemia, unspecified: Secondary | ICD-10-CM

## 2020-07-02 DIAGNOSIS — E1122 Type 2 diabetes mellitus with diabetic chronic kidney disease: Secondary | ICD-10-CM

## 2020-07-02 NOTE — Chronic Care Management (AMB) (Signed)
Chronic Care Management Pharmacy  Name: Jordan [name]  MRN: [id number] DOB: 1945-12-09  Initial Questions: 1. Have you seen any other providers since your last visit? NA 2. Any changes in your medicines or health? No   Chief Complaint/ HPI Jordan [name],  75 y.o. , female presents for their Follow-Up CCM visit with the clinical pharmacist via telephone due to COVID-19 Pandemic.  PCP : [name], MD  Their chronic conditions include: HTN, DM, HLD, bilateral leg edema, neuropathic pain, constipation, allergic rhinitis  Office Visits: 02/28/20 Ofilia Neas: Patient presented for medicare annual wellness visit.  10/31/2019- Carolann Littler, MD- Patient presented for office visit for diabetes follow up. A1c improved from 7.4 to 6.5% with recent addition of Trulicity. Patient to return in 3 months.   Result note (10/31/19): instructed to decrease furosemide to 7m twice daily and recheck BMP in 3 months.   08/01/2019- BCarolann Littler MD- Patient presented for office visit for follow up. A1c increased from 6.6 to 7.4%. patient started on Trulicity 09.92EQonce weekly. Patient to continue leg elevation and compression for swelling. Patient to come in for BMET to reassess creatinine and electrolytes.   Consult Visit: 07/29/2019- Urology- SFranchot Gallo Patient presented for office visit for calculus of kidney and history of UTI. Unable to access notes.   Medications: Outpatient Encounter Medications as of 07/02/2020  Medication Sig  . Accu-Chek FastClix Lancets MISC Use to check blood sugar once daily. Dx Code E11.65  . acetaminophen (TYLENOL) 500 MG tablet Take 500 mg by mouth every 6 (six) hours as needed for mild pain. Pain   . Alcohol Swabs PADS Use as directed  . aspirin 81 MG tablet Take 81 mg by mouth daily.   . Blood Glucose Calibration (TRUE METRIX LEVEL 1) Low SOLN Use as directed  . Blood Glucose Monitoring Suppl (ACCU-CHEK NANO SMARTVIEW) w/Device KIT Use to check blood  glucose 2 times daily.  . Blood Glucose Monitoring Suppl (TRUE METRIX METER) DEVI Use as directed  . cetirizine (ZYRTEC) 10 MG tablet Take 1 tablet (10 mg total) by mouth daily as needed. allergies  . Cholecalciferol (VITAMIN D) 2000 UNITS CAPS Take 2,000 Units by mouth. Takes only on days Monday through Thursday  . Dulaglutide (TRULICITY) 06.83MMH/9.6QISOPN Inject 0.75 mg into the skin once a week.  . furosemide (LASIX) 80 MG tablet TAKE 1 TABLET(80 MG) BY MOUTH TWICE DAILY  . gabapentin (NEURONTIN) 100 MG capsule TAKE 1 TO 2 CAPSULES BY MOUTH AT NIGHT AS NEEDED FOR NEUROPATHIC PAIN  . glucose blood (ACCU-CHEK SMARTVIEW) test strip USE TO CHECK BLOOD GLUCOSE TWICE DAILY AS DIRECTED  . glucose blood (TRUE METRIX BLOOD GLUCOSE TEST) test strip Use as instructed twice a day  . metFORMIN (GLUCOPHAGE) 500 MG tablet TAKE 2 TABLETS BY MOUTH EVERY MORNING AND EVERY EVENING  . Multiple Vitamin (MULTIVITAMIN) capsule Take 1 capsule by mouth daily.   . Omega-3 Fatty Acids (FISH OIL) 1200 MG CAPS Take 1,200 mg by mouth 2 (two) times daily.   . polyethylene glycol powder (GLYCOLAX/MIRALAX) powder Take 17 g by mouth daily as needed. One capfull daily by mouth with liquid  . potassium citrate (UROCIT-K) 10 MEQ (1080 MG) SR tablet Take 10 mEq by mouth 3 (three) times daily with meals.  . senna (SENOKOT) 8.6 MG tablet Take 1 tablet by mouth daily as needed.   . simvastatin (ZOCOR) 40 MG tablet TAKE 1 TABLET(40 MG) BY MOUTH DAILY  . sodium chloride (OCEAN) 0.65 % nasal spray  Place 1-2 sprays into the nose as needed. Allergies   . TRUEplus Lancets 28G MISC Use as directed  . [DISCONTINUED] losartan (COZAAR) 100 MG tablet Take 1 tablet (100 mg total) by mouth daily.   No facility-administered encounter medications on file as of 07/02/2020.     Current Diagnosis/Assessment:  Goals Addressed            This Visit's Progress   . Pharmacy Care Plan       CARE PLAN ENTRY (see longitudinal plan of care for  additional care plan information)  Current Barriers:  . Chronic Disease Management support, education, and care coordination needs related to Hypertension, Hyperlipidemia, Diabetes, and Edema    Hypertension BP Readings from Last 3 Encounters:  10/31/19 118/66  08/01/19 112/72  04/26/19 118/64   . Pharmacist Clinical Goal(s): o Over the next 150 days, patient will work with PharmD and providers to maintain BP goal <130/80 . Current regimen:  o Losartan 169m, 1 tablet once daily . Patient self care activities - Over the next 150 days, patient will: o Check BP at home, document, and provide at future appointments o Ensure daily salt intake < 2300 mg/day  Hyperlipidemia Lab Results  Component Value Date/Time   LDLCALC 60 01/26/2019 03:45 PM   LDLDIRECT 75.0 01/09/2017 02:48 PM   . Pharmacist Clinical Goal(s): o Over the next 150 days, patient will work with PharmD and providers to maintain LDL goal < 100 . Current regimen:  . Simvastatin 463m 1 tablet once daily  . Omega-3 120034m1 capsule twice daily  . Patient self care activities - Over the next 150 days, patient will: o Continue current medications as instructed.   Diabetes Lab Results  Component Value Date/Time   HGBA1C 6.5 (A) 10/31/2019 10:50 AM   HGBA1C 7.4 (A) 08/01/2019 11:13 AM   HGBA1C 6.8 (H) 12/22/2018 09:11 AM   HGBA1C 6.9 (H) 01/04/2016 11:20 AM   . Pharmacist Clinical Goal(s): o Over the next 150 days, patient will work with PharmD and providers to maintain A1c goal <7% . Current regimen:   dulaglutide (Trulicity) inject 0.76.81LXce a week   Metformin 500m49m tablets every morning and 2 tablets in the evening . Interventions: o We discussed how each metformin and Trulicity work differently in the body and both work on controlled blood sugars.  . Patient self care activities - Over the next 150 days, patient will: o Check blood sugar twice daily, document, and provide at future  appointments  Edema . Pharmacist Clinical Goal(s) o Over the next 150 days, patient will work with PharmD and providers to minimize leg edema.   . Current regimen:  o Furosemide 40mg26mtablet twice daily (recent dose decrease due to kidney function).  . Patient self care activities - Over the next 150 days, patient will: o Continue current medications and follow up for lab appointment to check on kidney function   Medication management . Pharmacist Clinical Goal(s): o Over the next 150 days, patient will work with PharmD and providers to achieve optimal medication adherence . Current pharmacy: [facility] . Interventions o Comprehensive medication review performed. o Continue current medication management strategy . Patient self care activities - Over the next 150 days, patient will: o Take medications as prescribed o Report any questions or concerns to PharmD and/or provider(s)  Please see past updates related to this goal by clicking on the "Past Updates" button in the selected goal  Diabetes   A1c < 7%  Recent Relevant Labs:  Lab Results  Component Value Date/Time   HGBA1C 6.5 (A) 10/31/2019 10:50 AM   HGBA1C 7.4 (A) 08/01/2019 11:13 AM   HGBA1C 6.8 (H) 12/22/2018 09:11 AM   HGBA1C 6.9 (H) 01/04/2016 11:20 AM   MICROALBUR 84.1 (H) 12/31/2010 09:03 AM    Checking BG: 2x per Day  Recent FBG Readings: 120  Recent HS BG readings: n/a  Patient has failed these meds in past: Victoza (cost)   Patient is currently controlled on the following medications:   dulaglutide (Trulicity) inject 8.14GY once a week (appropriate, effective, safe, accessible).   Metformin 540m, 2 tablets every morning and 2 tablets in the evening (appropriate, effective, not safe)  Last diabetic Eye exam:  Lab Results  Component Value Date/Time   HMDIABEYEEXA No Retinopathy 04/18/2019 12:00 AM    Last diabetic Foot exam:  Lab Results  Component Value Date/Time   HMDIABFOOTEX  normal 12/20/2014 12:00 AM    We discussed:   Mechanism of action of both metformin and dulaglutide (Trulicity) and how both are working to keep BGs controlled.   Sometimes BGs are erratic - has seen BGs higher on occasion but denies any low BGs  Plan Recommend dose decrease of metformin to 500 mg twice daily due to kidney function.  Hypertension  Denies dizziness/ lightheadedness   BP today is:  <130/80  Office blood pressures are  BP Readings from Last 3 Encounters:  10/31/19 118/66  08/01/19 112/72  04/26/19 118/64   Patient has failed these meds in the past: none  Patient home BP readings are ranging:  Not checking recently  Patient is controlled on:   Losartan 1044m 1 tablet once daily   We discussed: old machine was not functioning -Diet: patient has been trying to read package labels for sodium content  Plan Continue current medications   Hyperlipidemia   LDL goal < 100   Lipid Panel     Component Value Date/Time   CHOL 131 01/26/2019 1545   TRIG 171 (H) 01/26/2019 1545   HDL 37 (L) 01/26/2019 1545   LDLCALC 60 01/26/2019 1545   LDLDIRECT 75.0 01/09/2017 1448    Hepatic Function Latest Ref Rng & Units 12/22/2018 10/09/2017 01/09/2017  Total Protein 6.0 - 8.3 g/dL 7.0 6.9 6.7  Albumin 3.5 - 5.2 g/dL 4.0 4.0 3.9  AST 0 - 37 U/L '8 13 17  ' ALT 0 - 35 U/L '13 20 25  ' Alk Phosphatase 39 - 117 U/L 109 72 79  Total Bilirubin 0.2 - 1.2 mg/dL 0.5 0.3 0.3  Bilirubin, Direct 0.0 - 0.3 mg/dL 0.1 0.1 0.1     The ASCVD Risk score (GoBaidland et al., 2013) failed to calculate for the following reasons:   The systolic blood pressure is missing   Patient has failed these meds in past: none Patient is currently controlled on the following medications:  . Simvastatin 4035m1 tablet once daily (moderate intensity)  . Omega-3 1200m101m capsule twice daily   Plan Continue current medications  Recommend repeat lipid panel as it is overdue.  Edema    Patient  reports furosemide has helping some. She notes she is not as  swollen on the foot, but notes slight swelling from knees to ankles. Patient endorses using compression socks and attempts leg elevation mostly in the evening.   Kidney Function Lab Results  Component Value Date/Time   CREATININE 1.41 (H) 10/31/2019 11:08 AM   CREATININE  1.53 (H) 01/11/2019 09:22 AM   GFR 36.32 (L) 10/31/2019 11:08 AM   GFRNONAA 37 (L) 01/08/2016 09:38 PM   GFRAA 43 (L) 01/08/2016 09:38 PM   K 4.5 10/31/2019 11:08 AM   K 4.0 01/11/2019 09:22 AM    Patient has failed these meds in past: none  Patient is currently controlled on the following medications:   Furosemide 18m, 1 tablet daily  Potassium supplementation: (history of kidney stones)   Potassium citrate 162m, 1 tablet three times daily with meals   We discussed: weighing herself more consistently and to contact the office if she notices > 3 lbs change in 1 day or > 5 lbs in one week; patient tries to be consistent with water intake and 4-5 glasses of water a day; patient also drinks a cup of tea in the morning and evening    Plan Continue current medications  Primary prevention ASCVD    The 10-year ASCVD risk score (GMikey BussingC Jr., et al., 2013) is: 31%  Patient is currently controlled on the following medications:  . Aspirin 8139m1 tablet once daily   Plan Continue current medications  Neuropathic pain    Patient has failed these meds in past: none Patient is currently controlled on the following medications:  . Gabapentin 100m84m capsules at night as needed for neuropathic pain   Plan Continue current medications  Bone Health   Patient reports having a bone density scan in the past, but no recent scans.   Last DEXA Scan: none found in chart; orders were [facility]ue [facility] VD25[facility]   Unable to determine bone health status.   Patient is currently on the following  medications:   Cholecalciferol (vitamin D) 2000 units, 1 capsule on Monday through Thursday   Multivitamin 200 mg calcium (1 calcium)  We discussed:  Obtaining bone density every 2-3 years.   Calcium containing foods - eating cheese every day, some milk, green leafy veggies every day, some beans  Recommendations for 1200 mg of calcium daily from diet and supplementation  Plan Reassess at follow up visit.  Allergic rhinitis (query)    Patient is currently controlled on the following medications:  . Cetirizine 10mg19mtablet once daily as needed for allergies . Sodium chloride 0.65% nasal spray, instill 1 to 2 sprays into nose as needed for allergies   Plan Continue current medications  Pain    Patient reports taking when pain comes from increased leg swelling.   Patient has failed these meds in past: none  Patient is currently controlled on the following medications:  . Acetaminophen 500mg,82mablet every six hours as needed for mild pain   Plan Continue current medications  Constipation  Patient reports for most part, does not have daily constipation. Will take the following as needed.    Patient has failed these meds in past: none  Patient is currently controlled on the following medications:  . Mira[place] Kitchenax, 17 g once daily (PRN)  . Senna 8.6mg, 166mblet once daily (PRN)   Plan Continue current medications  OTC/ supplements    Patient is currently on the following medications:  . MultiMarland Kitchenitamin, 1 capsule once daily   Plan Continue current medications   Vaccines   Reviewed and discussed patient's vaccination history.    Immunization History  Administered Date(s) Administered  . Fluad Quad(high Dose 65+) 04/26/2019  . Influenza Split 05/04/2011  . Influenza Whole 04/16/2010  .  Influenza, High Dose Seasonal PF 04/15/2013, 05/14/2015, 04/10/2017  . Influenza, Seasonal, Injecte, Preservative Fre 06/01/2012  . Influenza,inj,Quad PF,6+ Mos 02/09/2014  .  Influenza-Unspecified 02/29/2016  . Moderna Sars-Covid-2 Vaccination 07/29/2019, 08/26/2019  . PFIZER(Purple Top)SARS-COV-2 Vaccination 04/26/2020  . PPD Test 01/07/2011, 01/07/2011, 01/07/2011, 01/07/2011, 01/07/2011  . Pneumococcal Conjugate-13 06/12/2014  . Pneumococcal Polysaccharide-23 06/16/2004, 01/07/2011  . Td 06/16/2005  . Tdap 06/03/2011, 09/27/2016   Verified COVID vaccines and booster date with [facility] and updated immunization history.  Plan  Recommended patient receive shingles vaccine at pharmacy.   . Medication Management   Patient's preferred pharmacy is:  [id number] [place], [place] [address] [place] [address] Phone: [phone] Fax: [facility] - [address], [place] [address] Phone: [phone] Fax: [phone]  Uses pill box? No - has own system Pt endorses 100% compliance  We discussed: Current pharmacy is preferred with insurance plan and patient is satisfied with pharmacy services  Plan  Continue current medication management strategy   Follow up: 5 month phone visit  Jeni Salles, PharmD [place] Pharmacist [facility] [place] [phone]

## 2020-07-06 ENCOUNTER — Other Ambulatory Visit: Payer: Self-pay | Admitting: Family Medicine

## 2020-07-16 NOTE — Patient Instructions (Addendum)
Hi Farrell,  It was lovely to get to meet you over the phone! As we discussed, in order to make sure your medications are working properly, it would be helpful to check your blood pressure at home even if its just a few times a month.  Please give me a call if you have any questions or need anything before our follow up!  Best, Maddie  Jeni Salles, PharmD [facility] Clinical Pharmacist [facility] [place]   Visit Information  Goals Addressed            This Visit's Progress   . Pharmacy Care Plan       CARE PLAN ENTRY (see longitudinal plan of care for additional care plan information)  Current Barriers:  . Chronic Disease Management support, education, and care coordination needs related to Hypertension, Hyperlipidemia, Diabetes, and Edema    Hypertension BP Readings from Last 3 Encounters:  10/31/19 118/66  08/01/19 112/72  04/26/19 118/64   . Pharmacist Clinical Goal(s): o Over the next 150 days, patient will work with PharmD and providers to maintain BP goal <130/80 . Current regimen:  o Losartan 100mg , 1 tablet once daily . Patient self care activities - Over the next 150 days, patient will: o Check BP at home, document, and provide at future appointments o Ensure daily salt intake < 2300 mg/day  Hyperlipidemia Lab Results  Component Value Date/Time   LDLCALC 60 01/26/2019 03:45 PM   LDLDIRECT 75.0 01/09/2017 02:48 PM   . Pharmacist Clinical Goal(s): o Over the next 150 days, patient will work with PharmD and providers to maintain LDL goal < 100 . Current regimen:  . Simvastatin 40mg , 1 tablet once daily  . Omega-3 1200mg , 1 capsule twice daily  . Patient self care activities - Over the next 150 days, patient will: o Continue current medications as instructed.   Diabetes Lab Results  Component Value Date/Time   HGBA1C 6.5 (A) 10/31/2019 10:50 AM   HGBA1C 7.4 (A) 08/01/2019 11:13 AM   HGBA1C 6.8 (H) 12/22/2018 09:11 AM    HGBA1C 6.9 (H) 01/04/2016 11:20 AM   . Pharmacist Clinical Goal(s): o Over the next 150 days, patient will work with PharmD and providers to maintain A1c goal <7% . Current regimen:   dulaglutide (Trulicity) inject 0.75mg  once a week   Metformin 500mg , 2 tablets every morning and 2 tablets in the evening . Interventions: o We discussed how each metformin and Trulicity work differently in the body and both work on controlled blood sugars.  . Patient self care activities - Over the next 150 days, patient will: o Check blood sugar twice daily, document, and provide at future appointments  Edema . Pharmacist Clinical Goal(s) o Over the next 150 days, patient will work with PharmD and providers to minimize leg edema.   . Current regimen:  o Furosemide 40mg , 1 tablet twice daily (recent dose decrease due to kidney function).  . Patient self care activities - Over the next 150 days, patient will: o Continue current medications and follow up for lab appointment to check on kidney function   Medication management . Pharmacist Clinical Goal(s): o Over the next 150 days, patient will work with PharmD and providers to achieve optimal medication adherence . Current pharmacy: [facility] . Interventions o Comprehensive medication review performed. o Continue current medication management strategy . Patient self care activities - Over the next 150 days, patient will: o Take medications as prescribed o Report any questions or concerns to PharmD  and/or provider(s)  Please see past updates related to this goal by clicking on the "Past Updates" button in the selected goal         Patient verbalizes understanding of instructions provided today and agrees to view in [place].   The pharmacy team will reach out to the patient again over the next 60 days.   Viona Gilmore, [facility]

## 2020-07-16 NOTE — Progress Notes (Signed)
She is due for several labs.  I will place future order for BMP, lipid, hepatic, and A1C and she needs to set up follow up soon.  If GFR 30-45 range will decrease Metformin 50%.

## 2020-07-16 NOTE — Addendum Note (Signed)
Addended by: [name] on: 07/16/2020 07:58 PM   Modules accepted: Orders

## 2020-07-27 ENCOUNTER — Other Ambulatory Visit: Payer: Self-pay

## 2020-07-30 ENCOUNTER — Other Ambulatory Visit: Payer: Self-pay

## 2020-07-30 ENCOUNTER — Encounter: Payer: Self-pay | Admitting: Family Medicine

## 2020-07-30 ENCOUNTER — Ambulatory Visit (INDEPENDENT_AMBULATORY_CARE_PROVIDER_SITE_OTHER): Payer: Medicare HMO | Admitting: Family Medicine

## 2020-07-30 VITALS — BP 138/78 | HR 73 | Ht 67.0 in | Wt 198.0 lb

## 2020-07-30 DIAGNOSIS — E119 Type 2 diabetes mellitus without complications: Secondary | ICD-10-CM | POA: Diagnosis not present

## 2020-07-30 DIAGNOSIS — E785 Hyperlipidemia, unspecified: Secondary | ICD-10-CM

## 2020-07-30 DIAGNOSIS — Z23 Encounter for immunization: Secondary | ICD-10-CM | POA: Diagnosis not present

## 2020-07-30 DIAGNOSIS — I10 Essential (primary) hypertension: Secondary | ICD-10-CM | POA: Diagnosis not present

## 2020-07-30 DIAGNOSIS — Z961 Presence of intraocular lens: Secondary | ICD-10-CM | POA: Diagnosis not present

## 2020-07-30 DIAGNOSIS — N1832 Chronic kidney disease, stage 3b: Secondary | ICD-10-CM

## 2020-07-30 DIAGNOSIS — E1122 Type 2 diabetes mellitus with diabetic chronic kidney disease: Secondary | ICD-10-CM

## 2020-07-30 DIAGNOSIS — H04123 Dry eye syndrome of bilateral lacrimal glands: Secondary | ICD-10-CM | POA: Diagnosis not present

## 2020-07-30 DIAGNOSIS — H40013 Open angle with borderline findings, low risk, bilateral: Secondary | ICD-10-CM | POA: Diagnosis not present

## 2020-07-30 LAB — POCT GLYCOSYLATED HEMOGLOBIN (HGB A1C): Hemoglobin A1C: 7.7 % — AB (ref 4.0–5.6)

## 2020-07-30 LAB — BASIC METABOLIC PANEL
BUN: 42 mg/dL — ABNORMAL HIGH (ref 6–23)
CO2: 21 mEq/L (ref 19–32)
Calcium: 9.6 mg/dL (ref 8.4–10.5)
Chloride: 107 mEq/L (ref 96–112)
Creatinine, Ser: 1.8 mg/dL — ABNORMAL HIGH (ref 0.40–1.20)
GFR: 27.09 mL/min — ABNORMAL LOW (ref >60.00)
Glucose, Bld: 154 mg/dL — ABNORMAL HIGH (ref 70–99)
Potassium: 3.8 mEq/L (ref 3.5–5.1)
Sodium: 141 mEq/L (ref 135–145)

## 2020-07-30 LAB — HEPATIC FUNCTION PANEL
ALT: 13 U/L (ref 0–35)
AST: 10 U/L (ref 0–37)
Albumin: 4 g/dL (ref 3.5–5.2)
Alkaline Phosphatase: 109 U/L (ref 39–117)
Bilirubin, Direct: 0.1 mg/dL (ref 0.0–0.3)
Total Bilirubin: 0.5 mg/dL (ref 0.2–1.2)
Total Protein: 7.1 g/dL (ref 6.0–8.3)

## 2020-07-30 LAB — LIPID PANEL
Cholesterol: 142 mg/dL (ref 0–200)
HDL: 34.2 mg/dL — ABNORMAL LOW (ref >39.00)
NonHDL: 107.67
Total CHOL/HDL Ratio: 4
Triglycerides: 211 mg/dL — ABNORMAL HIGH (ref 0.0–149.0)
VLDL: 42.2 mg/dL — ABNORMAL HIGH (ref 0.0–40.0)

## 2020-07-30 LAB — LDL CHOLESTEROL, DIRECT: Direct LDL: 89 mg/dL

## 2020-07-30 LAB — HEMOGLOBIN A1C: Hgb A1c MFr Bld: 7.5 % — ABNORMAL HIGH (ref 4.6–6.5)

## 2020-07-30 LAB — HM DIABETES EYE EXAM

## 2020-07-30 NOTE — Addendum Note (Signed)
Addended by: Tessie Fass D on: 07/30/2020 09:45 AM   Modules accepted: Orders

## 2020-07-30 NOTE — Progress Notes (Signed)
Established Patient Office Visit  Subjective:  Patient ID: Jordan [name], female    DOB: May 13, 1946  Age: 75 y.o. MRN: [id number]  CC:  Chief Complaint  Patient presents with  . Diabetes  . Hypertension    HPI Jordan [name] presents for medical follow-up.  She has history of venous stasis lower extremities, hypertension, obesity, type 2 diabetes, hyperlipidemia.  She has had prior history of kidney stones.  Generally doing fairly well.  Has been somewhat negligent with diet with in terms of overall carbohydrate intake.  She states she generally does not eat a lot of "sweets ".  Not checking blood sugars regularly.  Last A1c was 6.5%.  She takes Metformin and Trulicity.  No polyuria or polydipsia.  She has eye exam set up for later today.  She has not had flu vaccine.  Other vaccines up-to-date.  Medications reviewed.  Blood pressure generally well controlled by home readings.  No recent headaches or dizziness.  Peripheral edema is stable.  Past Medical History:  Diagnosis Date  . Arthritis    OA  . Back pain   . Bilateral swelling of feet   . Blood transfusion 1992   AFTER  SURGERY TO REMOVE BLADDER  . Cataract   . 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 COLONIC POLYPS 07/11/2010  . Constipation   . Diabetes mellitus    ORAL MED FOR DIABETES  . Gallbladder problem   . GERD (gastroesophageal reflux disease)    PAST HISTORY GERD--WATCHES DIET - NOT ON ANY MEDS FOR GERD  . HYPERLIPIDEMIA 11/29/2009  . HYPERTENSION 11/29/2009  . Incisional hernia    NO PAIN-NOT CAUSING ANY DISCOMFORT  . Kidney infection   . Osteoarthritis   . Osteomyelitis ([place])   . PONV (postoperative nausea and vomiting)   . 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 Shortness of breath   . Stroke (cerebrum) [facility])     Past Surgical History:  Procedure Laterality Date  . 1992    .  ABDOMINAL HYSTERECTOMY  1990   partial  . ACHILLES TENDON LENGTHENING  1962  . ADENOIDECTOMY  1950  . APPENDECTOMY    . CARPAL TUNNEL RELEASE  1983  . CHOLECYSTECTOMY    . COLONOSCOPY WITH PROPOFOL N/A 12/07/2014   Procedure: COLONOSCOPY WITH PROPOFOL;  Surgeon: Gatha Mayer, MD;  Location: [facility];  Service: Endoscopy;  Laterality: N/A;  . LEFT CARPAL TUNNEL RELEASE X 2    . LUMBAR SYMPATHETECTOMIES X 2    . MULTIPLE RIGHT ANKLE SURGERIES    . NEPHROLITHOTOMY  10/01/2011   Procedure: NEPHROLITHOTOMY PERCUTANEOUS;  Surgeon: Franchot Gallo, MD;  Location: [facility] OR[facility];  Service: Urology;  Laterality: Right;       . PARTIAL AMPUTATION OF LEFT GREAT TOE     1985  . radical cystectomy  1992   neurogenic bladder sec CP  . REPAIR OF NASAL FRACTURE  1985  . REVISION UROSTOMY CUTANEOUS  1992  . RIGHT CARPAL TUNNEL RELEASE    . RIGHT HIP FLEXION CONTRACTURE REPAIR  1986  . RT ANKLE FUSION  1975   SEVERAL RT ANKLE SURGERIES PRIOR TO THE FUSION  . 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 TONSILLECTOMY    . TUBAL LIGATION  1975  . VAGINAL HYSTERECTOMY  1990  . WISDOM TOOTH EXTRACTION  1971    Family History  Problem Relation Age of Onset  . Heart disease Mother 80       COPD  . Hypertension Mother   . Hyperlipidemia Mother   . Liver disease Mother   . Alcoholism Mother   . Heart disease Father        COPD   . Emphysema Father   . Colon cancer Father 20       Died at 21  . Bladder Cancer Father   . Diabetes Father   . Hyperlipidemia Father   . Cancer Father   . Liver disease Father   . Sleep apnea Father   . Alcoholism Father   . Colon polyps Sister   . Heart disease Maternal Grandmother   . Heart attack Maternal Grandfather   . Esophageal cancer Neg Hx   . Kidney disease Neg Hx     Social History   Socioeconomic History  . Marital status: Married    Spouse name: Not on file  . Number of children: 1  . Years of education: Not on file  . Highest  education level: Not on file  Occupational History  . Occupation: Social worker; Retail banker  Tobacco Use  . Smoking status: Never Smoker  . Smokeless tobacco: Never Used  Vaping Use  . Vaping Use: Never used  Substance and Sexual Activity  . Alcohol use: No    Alcohol/week: 0.0 standard drinks  . Drug use: No  . Sexual activity: Not on file  Other Topics Concern  . 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 Difficulty of Paying Living Expenses: Not hard at all  Food Insecurity: No Food Insecurity  . Worried About Charity fundraiser in the Last Year: Never true  . Ran Out of Food in the Last Year: Never true  Transportation Needs: No Transportation Needs  . Lack of Transportation (Medical): No  . Lack of Transportation (Non-Medical): No  Physical Activity: Insufficiently Active  . Days of Exercise per Week: 3 days  . Minutes of Exercise per Session: 20 min  Stress: No Stress Concern Present  . Feeling of Stress : Not at all  Social Connections: Moderately Integrated  . Frequency of Communication with Friends and Family: More than three times a week  . Frequency of Social Gatherings with Friends and Family: More than three times a week  . Attends Religious Services: More than 4 times per year  . Active Member of Clubs or Organizations: No  . Attends Archivist Meetings: Never  . Marital Status: Married  Human resources officer Violence: Not At Risk  . Fear of Current or Ex-Partner: No  . Emotionally Abused: No  . Physically Abused: No  . Sexually Abused: No    Outpatient Medications Prior to Visit  Medication Sig Dispense Refill  . Accu-Chek FastClix Lancets MISC Use to check blood sugar once daily. Dx Code E11.65 102 each 1  . acetaminophen (TYLENOL) 500 MG tablet Take 500 mg by mouth every 6 (six) hours as needed for  mild pain. Pain     . Alcohol Swabs PADS Use as directed 100 each 3  . aspirin 81 MG tablet Take 81 mg by mouth daily.    . Blood Glucose Calibration (TRUE METRIX LEVEL 1) Low SOLN Use as directed 1  each 1  . Blood Glucose Monitoring Suppl (ACCU-CHEK NANO SMARTVIEW) w/Device KIT Use to check blood glucose 2 times daily. 1 kit 1  . Blood Glucose Monitoring Suppl (TRUE METRIX METER) DEVI Use as directed 1 each 1  . cetirizine (ZYRTEC) 10 MG tablet Take 1 tablet (10 mg total) by mouth daily as needed. allergies 90 tablet 1  . Cholecalciferol (VITAMIN D) 2000 UNITS CAPS Take 2,000 Units by mouth. Takes only on days Monday through Thursday    . Dulaglutide (TRULICITY) 2.63 FH/5.4TG SOPN Inject 0.75 mg into the skin once a week. 2 mL 11  . furosemide (LASIX) 80 MG tablet TAKE 1 TABLET(80 MG) BY MOUTH TWICE DAILY 180 tablet 1  . gabapentin (NEURONTIN) 100 MG capsule TAKE 1 TO 2 CAPSULES BY MOUTH AT NIGHT AS NEEDED FOR NEUROPATHIC PAIN 180 capsule 3  . glucose blood (ACCU-CHEK SMARTVIEW) test strip USE TO CHECK BLOOD GLUCOSE TWICE DAILY AS DIRECTED 100 each 3  . glucose blood (TRUE METRIX BLOOD GLUCOSE TEST) test strip Use as instructed twice a day 100 each 12  . losartan (COZAAR) 100 MG tablet TAKE 1 TABLET(100 MG) BY MOUTH DAILY 90 tablet 1  . metFORMIN (GLUCOPHAGE) 500 MG tablet TAKE 2 TABLETS BY MOUTH EVERY MORNING AND EVERY EVENING 360 tablet 1  . Multiple Vitamin (MULTIVITAMIN) capsule Take 1 capsule by mouth daily.    . Omega-3 Fatty Acids (FISH OIL) 1200 MG CAPS Take 1,200 mg by mouth 2 (two) times daily.    . polyethylene glycol powder (GLYCOLAX/MIRALAX) powder Take 17 g by mouth daily as needed. One capfull daily by mouth with liquid    . potassium citrate (UROCIT-K) 10 MEQ (1080 MG) SR tablet Take 10 mEq by mouth 3 (three) times daily with meals.    . senna (SENOKOT) 8.6 MG tablet Take 1 tablet by mouth daily as needed.     . simvastatin (ZOCOR) 40 MG tablet TAKE 1 TABLET(40 MG) BY MOUTH  DAILY 90 tablet 3  . sodium chloride (OCEAN) 0.65 % nasal spray Place 1-2 sprays into the nose as needed. Allergies     . TRUEplus Lancets 28G MISC Use as directed 100 each 3   No facility-administered medications prior to visit.    Allergies  Allergen Reactions  . Morphine Shortness Of Breath  . Onglyza [Saxagliptin] Other (See Comments)    Headache within 45 minutes of taking this.  . Baclofen     GI upset  . Lisinopril Cough  . Metoclopramide Hcl     Reglan, irritable  . Penicillins     " MAJOR CASE OF HIVES"    ROS Review of Systems  Constitutional: Negative for fatigue.  Eyes: Negative for visual disturbance.  Respiratory: Negative for cough, chest tightness, shortness of breath and wheezing.   Cardiovascular: Negative for chest pain, palpitations and leg swelling.  Endocrine: Negative for polydipsia and polyuria.  Neurological: Negative for dizziness, seizures, syncope, weakness, light-headedness and headaches.      Objective:    Physical Exam Constitutional:      Appearance: She is well-developed and well-nourished.  Eyes:     Pupils: Pupils are equal, round, and reactive to light.  Neck:     Thyroid: No thyromegaly.     Vascular: No JVD.  Cardiovascular:     Rate and Rhythm: Normal rate and regular rhythm.     Heart sounds: No gallop.   Pulmonary:     Effort: Pulmonary effort is normal. No respiratory distress.  Breath sounds: Normal breath sounds. No wheezing or rales.  Musculoskeletal:        General: No edema.     Cervical back: Neck supple.  Skin:    Comments: No foot lesions noted.  She does have some general dryness of both feet.  Normal sensory function with monofilament.  Neurological:     Mental Status: She is alert.     BP 138/78 (BP Location: Left Arm, Cuff Size: Large)   Pulse 73   Ht '5\' 7"'  (1.702 m)   Wt 198 lb (89.8 kg) Comment: Per patient  SpO2 97%   BMI 31.01 kg/m  Wt Readings from Last 3 Encounters:  07/30/20 198 lb (89.8  kg)  04/25/19 231 lb (104.8 kg)  03/24/19 215 lb (97.5 kg)     Health Maintenance Due  Topic Date Due  . INFLUENZA VACCINE  01/15/2020    There are no preventive care reminders to display for this patient.  Lab Results  Component Value Date   TSH 1.130 01/26/2019   Lab Results  Component Value Date   WBC 9.3 01/26/2019   HGB 11.2 01/26/2019   HCT 36.2 01/26/2019   MCV 87 01/26/2019   PLT 379.0 01/15/2016   Lab Results  Component Value Date   NA 136 10/31/2019   K 4.5 10/31/2019   CO2 16 (L) 10/31/2019   GLUCOSE 141 (H) 10/31/2019   BUN 42 (H) 10/31/2019   CREATININE 1.41 (H) 10/31/2019   BILITOT 0.5 12/22/2018   ALKPHOS 109 12/22/2018   AST 8 12/22/2018   ALT 13 12/22/2018   PROT 7.0 12/22/2018   ALBUMIN 4.0 12/22/2018   CALCIUM 9.1 10/31/2019   ANIONGAP 11 01/08/2016   GFR 36.32 (L) 10/31/2019   Lab Results  Component Value Date   CHOL 131 01/26/2019   Lab Results  Component Value Date   HDL 37 (L) 01/26/2019   Lab Results  Component Value Date   LDLCALC 60 01/26/2019   Lab Results  Component Value Date   TRIG 171 (H) 01/26/2019   Lab Results  Component Value Date   CHOLHDL 3 12/22/2018   Lab Results  Component Value Date   HGBA1C 7.7 (A) 07/30/2020      Assessment & Plan:   Problem List Items Addressed This Visit      Unprioritized   Essential hypertension   Relevant Orders   Basic metabolic panel   Hyperlipidemia   Relevant Orders   Lipid panel   Hepatic function panel   Type 2 diabetes mellitus ([place]) - Primary   Relevant Orders   POCT glycosylated hemoglobin (Hb A1C) (Completed)   Basic metabolic panel    Other Visit Diagnoses    Need for influenza vaccination       Relevant Orders   Flu Vaccine QUAD High Dose(Fluad)    A1c today is 7.7% from prior 6.5%.  We discussed options including additional medication or possibly increasing her Trulicity 1.5 versus lifestyle modification.  We decided to try the latter and recheck in  3 months.  If not improving at that point either consider addition of Januvia or increasing Trulicity to 1.5 mg subcutaneous once weekly  Patient encouraged to follow-up with eye exam later today  Flu vaccine given  Obtain additional labs including lipids, hepatic, basic metabolic panel  54-monthfollow-up  No orders of the defined types were placed in this encounter.   Follow-up: Return in about 3 months (around 10/27/2020).    BCarolann Littler MD

## 2020-07-30 NOTE — Patient Instructions (Signed)
Diabetes Mellitus and Nutrition, Adult When you have diabetes, or diabetes mellitus, it is very important to have healthy eating habits because your blood sugar (glucose) levels are greatly affected by what you eat and drink. Eating healthy foods in the right amounts, at about the same times every day, can help you:  Control your blood glucose.  Lower your risk of heart disease.  Improve your blood pressure.  Reach or maintain a healthy weight. What can affect my meal plan? Every person with diabetes is different, and each person has different needs for a meal plan. Your health care provider may recommend that you work with a dietitian to make a meal plan that is best for you. Your meal plan may vary depending on factors such as:  The calories you need.  The medicines you take.  Your weight.  Your blood glucose, blood pressure, and cholesterol levels.  Your activity level.  Other health conditions you have, such as heart or kidney disease. How do carbohydrates affect me? Carbohydrates, also called carbs, affect your blood glucose level more than any other type of food. Eating carbs naturally raises the amount of glucose in your blood. Carb counting is a method for keeping track of how many carbs you eat. Counting carbs is important to keep your blood glucose at a healthy level, especially if you use insulin or take certain oral diabetes medicines. It is important to know how many carbs you can safely have in each meal. This is different for every person. Your dietitian can help you calculate how many carbs you should have at each meal and for each snack. How does alcohol affect me? Alcohol can cause a sudden decrease in blood glucose (hypoglycemia), especially if you use insulin or take certain oral diabetes medicines. Hypoglycemia can be a life-threatening condition. Symptoms of hypoglycemia, such as sleepiness, dizziness, and confusion, are similar to symptoms of having too much  alcohol.  Do not drink alcohol if: ? Your health care provider tells you not to drink. ? You are pregnant, may be pregnant, or are planning to become pregnant.  If you drink alcohol: ? Do not drink on an empty stomach. ? Limit how much you use to:  0-1 drink a day for women.  0-2 drinks a day for men. ? Be aware of how much alcohol is in your drink. In the [place] one drink equals one 12 oz bottle of beer (355 mL), one 5 oz glass of wine (148 mL), or one 1 oz glass of hard liquor (44 mL). ? Keep yourself hydrated with water, diet soda, or unsweetened iced tea.  Keep in mind that regular soda, juice, and other mixers may contain a lot of sugar and must be counted as carbs. What are tips for following this plan? Reading food labels  Start by checking the serving size on the "Nutrition Facts" label of packaged foods and drinks. The amount of calories, carbs, fats, and other nutrients listed on the label is based on one serving of the item. Many items contain more than one serving per package.  Check the total grams (g) of carbs in one serving. You can calculate the number of servings of carbs in one serving by dividing the total carbs by 15. For example, if a food has 30 g of total carbs per serving, it would be equal to 2 servings of carbs.  Check the number of grams (g) of saturated fats and trans fats in one serving. Choose foods that have   a low amount or none of these fats.  Check the number of milligrams (mg) of salt (sodium) in one serving. Most people should limit total sodium intake to less than 2,300 mg per day.  Always check the nutrition information of foods labeled as "low-fat" or "nonfat." These foods may be higher in added sugar or refined carbs and should be avoided.  Talk to your dietitian to identify your daily goals for nutrients listed on the label. Shopping  Avoid buying canned, pre-made, or processed foods. These foods tend to be high in fat, sodium, and added  sugar.  Shop around the outside edge of the grocery store. This is where you will most often find fresh fruits and vegetables, bulk grains, fresh meats, and fresh dairy. Cooking  Use low-heat cooking methods, such as baking, instead of high-heat cooking methods like deep frying.  Cook using healthy oils, such as olive, canola, or sunflower oil.  Avoid cooking with butter, cream, or high-fat meats. Meal planning  Eat meals and snacks regularly, preferably at the same times every day. Avoid going long periods of time without eating.  Eat foods that are high in fiber, such as fresh fruits, vegetables, beans, and whole grains. Talk with your dietitian about how many servings of carbs you can eat at each meal.  Eat 4-6 oz (112-168 g) of lean protein each day, such as lean meat, chicken, fish, eggs, or tofu. One ounce (oz) of lean protein is equal to: ? 1 oz (28 g) of meat, chicken, or fish. ? 1 egg. ?  cup (62 g) of tofu.  Eat some foods each day that contain healthy fats, such as avocado, nuts, seeds, and fish.   What foods should I eat? Fruits Berries. Apples. Oranges. Peaches. Apricots. Plums. Grapes. Mango. Papaya. Pomegranate. Kiwi. Cherries. Vegetables Lettuce. Spinach. Leafy greens, including kale, chard, collard greens, and mustard greens. Beets. Cauliflower. Cabbage. Broccoli. Carrots. Green beans. Tomatoes. Peppers. Onions. Cucumbers. Brussels sprouts. Grains Whole grains, such as whole-wheat or whole-grain bread, crackers, tortillas, cereal, and pasta. Unsweetened oatmeal. Quinoa. Brown or wild rice. Meats and other proteins Seafood. Poultry without skin. Lean cuts of poultry and beef. Tofu. Nuts. Seeds. Dairy Low-fat or fat-free dairy products such as milk, yogurt, and cheese. The items listed above may not be a complete list of foods and beverages you can eat. Contact a dietitian for more information. What foods should I avoid? Fruits Fruits canned with  syrup. Vegetables Canned vegetables. Frozen vegetables with butter or cream sauce. Grains Refined white flour and flour products such as bread, pasta, snack foods, and cereals. Avoid all processed foods. Meats and other proteins Fatty cuts of meat. Poultry with skin. Breaded or fried meats. Processed meat. Avoid saturated fats. Dairy Full-fat yogurt, cheese, or milk. Beverages Sweetened drinks, such as soda or iced tea. The items listed above may not be a complete list of foods and beverages you should avoid. Contact a dietitian for more information. Questions to ask a health care provider  Do I need to meet with a diabetes educator?  Do I need to meet with a dietitian?  What number can I call if I have questions?  When are the best times to check my blood glucose? Where to find more information:  American Diabetes Association: diabetes.org  Academy of Nutrition and Dietetics: www.eatright.org  National Institute of Diabetes and Digestive and Kidney Diseases: www.niddk.nih.gov  Association of Diabetes Care and Education Specialists: www.diabeteseducator.org Summary  It is important to have healthy eating   habits because your blood sugar (glucose) levels are greatly affected by what you eat and drink.  A healthy meal plan will help you control your blood glucose and maintain a healthy lifestyle.  Your health care provider may recommend that you work with a dietitian to make a meal plan that is best for you.  Keep in mind that carbohydrates (carbs) and alcohol have immediate effects on your blood glucose levels. It is important to count carbs and to use alcohol carefully. This information is not intended to replace advice given to you by your health care provider. Make sure you discuss any questions you have with your health care provider. Document Revised: 05/10/2019 Document Reviewed: 05/10/2019 Elsevier Patient Education  2021 Elsevier Inc.  

## 2020-08-10 NOTE — Progress Notes (Signed)
Form from [facility] received.  Form filled out and records faxed back.

## 2020-09-05 ENCOUNTER — Encounter: Payer: Self-pay | Admitting: Family Medicine

## 2020-09-05 ENCOUNTER — Telehealth: Payer: Self-pay | Admitting: Pharmacist

## 2020-09-05 NOTE — Chronic Care Management (AMB) (Addendum)
Chronic Care Management Pharmacy Assistant   Name: Jordan [name]  MRN: [id number] DOB: 26-Aug-1945  Reason for Encounter: Disease State Conditions to be addressed/monitored: DMII  Recent office visits:  02.14.2022 Jordan Post, MD Family Medicine Discontinued Metformin Increased Trulicity from 1.24 mg to 1.5 mg weekly  Recent consult visits:  None  Hospital visits:  None in previous 6 months  Medications: Outpatient Encounter Medications as of 09/05/2020  Medication Sig   Accu-Chek FastClix Lancets MISC Use to check blood sugar once daily. Dx Code E11.65   acetaminophen (TYLENOL) 500 MG tablet Take 500 mg by mouth every 6 (six) hours as needed for mild pain. Pain    Alcohol Swabs PADS Use as directed   aspirin 81 MG tablet Take 81 mg by mouth daily.   Blood Glucose Calibration (TRUE METRIX LEVEL 1) Low SOLN Use as directed   Blood Glucose Monitoring Suppl (ACCU-CHEK NANO SMARTVIEW) w/Device KIT Use to check blood glucose 2 times daily.   Blood Glucose Monitoring Suppl (TRUE METRIX METER) DEVI Use as directed   cetirizine (ZYRTEC) 10 MG tablet Take 1 tablet (10 mg total) by mouth daily as needed. allergies   Cholecalciferol (VITAMIN D) 2000 UNITS CAPS Take 2,000 Units by mouth. Takes only on days Monday through Thursday   Dulaglutide (TRULICITY) 5.80 DX/8.3JA SOPN Inject 0.75 mg into the skin once a week.   furosemide (LASIX) 80 MG tablet TAKE 1 TABLET(80 MG) BY MOUTH TWICE DAILY   gabapentin (NEURONTIN) 100 MG capsule TAKE 1 TO 2 CAPSULES BY MOUTH AT NIGHT AS NEEDED FOR NEUROPATHIC PAIN   glucose blood (ACCU-CHEK SMARTVIEW) test strip USE TO CHECK BLOOD GLUCOSE TWICE DAILY AS DIRECTED   glucose blood (TRUE METRIX BLOOD GLUCOSE TEST) test strip Use as instructed twice a day   losartan (COZAAR) 100 MG tablet TAKE 1 TABLET(100 MG) BY MOUTH DAILY   metFORMIN (GLUCOPHAGE) 500 MG tablet TAKE 2 TABLETS BY MOUTH EVERY MORNING AND EVERY EVENING   Multiple Vitamin (MULTIVITAMIN)  capsule Take 1 capsule by mouth daily.   Omega-3 Fatty Acids (FISH OIL) 1200 MG CAPS Take 1,200 mg by mouth 2 (two) times daily.   polyethylene glycol powder (GLYCOLAX/MIRALAX) powder Take 17 g by mouth daily as needed. One capfull daily by mouth with liquid   potassium citrate (UROCIT-K) 10 MEQ (1080 MG) SR tablet Take 10 mEq by mouth 3 (three) times daily with meals.   senna (SENOKOT) 8.6 MG tablet Take 1 tablet by mouth daily as needed.    simvastatin (ZOCOR) 40 MG tablet TAKE 1 TABLET(40 MG) BY MOUTH DAILY   sodium chloride (OCEAN) 0.65 % nasal spray Place 1-2 sprays into the nose as needed. Allergies    TRUEplus Lancets 28G MISC Use as directed   No facility-administered encounter medications on file as of 09/05/2020.   Recent Relevant Labs: Lab Results  Component Value Date/Time   HGBA1C 7.5 (H) 07/30/2020 09:45 AM   HGBA1C 7.7 (A) 07/30/2020 09:16 AM   HGBA1C 6.5 (A) 10/31/2019 10:50 AM   HGBA1C 6.8 (H) 12/22/2018 09:11 AM   MICROALBUR 84.1 (H) 12/31/2010 09:03 AM    Kidney Function Lab Results  Component Value Date/Time   CREATININE 1.80 (H) 07/30/2020 09:45 AM   CREATININE 1.41 (H) 10/31/2019 11:08 AM   GFR 27.09 (L) 07/30/2020 09:45 AM   GFRNONAA 37 (L) 01/08/2016 09:38 PM   GFRAA 43 (L) 01/08/2016 09:38 PM   Current antihyperglycemic regimen:  Trulicity 2.50 mg weekly What recent interventions/DTPs have been  made to improve glycemic control:  Increased Trulicity  from 8.11 mg to 1.50 Have there been any recent hospitalizations or ED visits since last visit with CPP? No Patient denies hypoglycemic symptoms, including Pale, Sweaty, Shaky, Hungry, Nervous/irritable and Vision changes Patient denies hyperglycemic symptoms, including blurry vision, excessive thirst, fatigue, polyuria and weakness How often are you checking your blood sugar? once daily What are your blood sugars ranging?  Fasting:Range from 130-140 During the week, how often does your blood glucose drop  below 70? Never Are you checking your feet daily/regularly? Yes  Adherence Review: Is the patient currently on a STATIN medication? Yes Is the patient currently on ACE/ARB medication? Yes Does the patient have >5 day gap between last estimated fill dates? No  I spoke with the patient and discussed medication adherence. There were no issues with the current medication. She states that she has been doing well recently. Last month she followed up with her PCP. Her A1C percentage increased from 0.3% to 1.5% Her Trulicity was increased to 1.5 mg weekly, and her metformin was discontinued. She has had no other changes to her medications. She denies any side effects from her medications. She denies ED visits since his last CPP follow-up. The patient denies any side effects with his medication. Also, denies any problems with his current pharmacy.  Star Rating Drugs:  Dispensed Quantity Pharmacy  Simvastatin 40 mg tablet 12.21.2021 90 [facility]  Losartan 100 mg tablet 03.18.2022 90 [facility]   Jordan [name], [place] Assistant [phone]

## 2020-09-06 ENCOUNTER — Telehealth: Payer: Self-pay | Admitting: Family Medicine

## 2020-09-06 NOTE — Telephone Encounter (Signed)
The patient is needing a new Rx sent to the pharmacy for Dulaglutide (TRULICITY) 2.11 HE/1.7EY SOPN for 1.5 mg  Chronic Care Management (AMB) by Dondra Prader 01/27/4817  Increased Trulicity from 5.63 mg to 1.5 mg weekly    [id number] [address], [place] AT [address] [place] Phone:  [phone]  Fax:  [phone]

## 2020-09-07 MED ORDER — TRULICITY 1.5 MG/0.5ML ~~LOC~~ SOAJ: 1.5000 mg | SUBCUTANEOUS | 11 refills | Status: DC

## 2020-09-07 NOTE — Addendum Note (Signed)
Addended by: [name] on: 09/07/2020 07:28 AM   Modules accepted: Orders

## 2020-11-23 ENCOUNTER — Telehealth: Payer: Self-pay | Admitting: Pharmacist

## 2020-11-23 NOTE — Chronic Care Management (AMB) (Signed)
Date- Patient called to remind of appointment with Watt Climes on 06.13.2022 at 1:00 pm   No answer, left message of appointment date, time and type of appointment (telephone). Left message to have all medications, supplements, blood pressure and/or blood sugar logs available during appointment and to return call if need to reschedule.   Star Rating Drug: Medication Dispensed  Quantity Pharmacy  Losartan 100 mg 04.14.2022 90 [facility]  Metformin 500 mg 08.30.2021 [facility]  Simvastatin 40 mg 09.01.2021 90 [facility]   Called and verified fill dates with pharmacy.  Any gaps in medications fill history? Yes   Maia Breslow, [place] Pharmacist Assistant [phone]

## 2020-11-26 ENCOUNTER — Telehealth: Payer: Self-pay | Admitting: Family Medicine

## 2020-11-26 ENCOUNTER — Ambulatory Visit (INDEPENDENT_AMBULATORY_CARE_PROVIDER_SITE_OTHER): Payer: Medicare HMO | Admitting: Pharmacist

## 2020-11-26 DIAGNOSIS — N1832 Chronic kidney disease, stage 3b: Secondary | ICD-10-CM | POA: Diagnosis not present

## 2020-11-26 DIAGNOSIS — E1122 Type 2 diabetes mellitus with diabetic chronic kidney disease: Secondary | ICD-10-CM

## 2020-11-26 DIAGNOSIS — I10 Essential (primary) hypertension: Secondary | ICD-10-CM

## 2020-11-26 NOTE — Progress Notes (Signed)
Chronic Care Management Pharmacy Note  11/29/2020 Name:  Jordan [name] MRN:  [id number] DOB:  09-03-1945  Summary: A1c not at goal < 7%  Recommendations/Changes made from today's visit: -Recommended dietary modifications and the addition of exercises (arm and leg lifts) to lower A1c  Plan: Plan to follow up in 6 months   Subjective: Jordan [name] is an 75 y.o. year old female who is a primary patient of Burchette, Alinda Sierras, MD.  The CCM team was consulted for assistance with disease management and care coordination needs.    Engaged with patient by telephone for follow up visit in response to provider referral for pharmacy case management and/or care coordination services.   Consent to Services:  The patient was given information about Chronic Care Management services, agreed to services, and gave verbal consent prior to initiation of services.  Please see initial visit note for detailed documentation.   Patient Care Team: [name], MD as PCP - General (Family Medicine) Franchot Gallo, MD as Consulting Physician (Urology) Jari Pigg, MD as Consulting Physician (Dermatology) Constance Haw, MD as Consulting Physician (Cardiology) Viona Gilmore, [facility] as Pharmacist (Pharmacist)  Recent office visits: 07/30/20 Carolann Littler, MD: Patient presented for DM follow up. D/C'd metformin due to kidney function and increased Trulicity to 1.5 mg weekly.  02/28/20 Ofilia Neas: Patient presented for medicare annual wellness visit.  Recent consult visits: 07/29/2019- Urology- Franchot Gallo- Patient presented for office visit for calculus of kidney and history of UTI. Unable to access notes.  Hospital visits: None in previous 6 months   Objective:  Lab Results  Component Value Date   CREATININE 1.80 (H) 07/30/2020   BUN 42 (H) 07/30/2020   GFR 27.09 (L) 07/30/2020   GFRNONAA 37 (L) 01/08/2016   GFRAA 43 (L) 01/08/2016   NA 141 07/30/2020   K 3.8  07/30/2020   CALCIUM 9.6 07/30/2020   CO2 21 07/30/2020   GLUCOSE 154 (H) 07/30/2020    Lab Results  Component Value Date/Time   HGBA1C 7.5 (H) 07/30/2020 09:45 AM   HGBA1C 7.7 (A) 07/30/2020 09:16 AM   HGBA1C 6.5 (A) 10/31/2019 10:50 AM   HGBA1C 6.8 (H) 12/22/2018 09:11 AM   GFR 27.09 (L) 07/30/2020 09:45 AM   GFR 36.32 (L) 10/31/2019 11:08 AM   MICROALBUR 84.1 (H) 12/31/2010 09:03 AM    Last diabetic Eye exam:  Lab Results  Component Value Date/Time   HMDIABEYEEXA No Retinopathy 07/30/2020 12:00 AM    Last diabetic Foot exam:  Lab Results  Component Value Date/Time   HMDIABFOOTEX normal 12/20/2014 12:00 AM     Lab Results  Component Value Date   CHOL 142 07/30/2020   HDL 34.20 (L) 07/30/2020   LDLCALC 60 01/26/2019   LDLDIRECT 89.0 07/30/2020   TRIG 211.0 (H) 07/30/2020   CHOLHDL 4 07/30/2020    Hepatic Function Latest Ref Rng & Units 07/30/2020 12/22/2018 10/09/2017  Total Protein 6.0 - 8.3 g/dL 7.1 7.0 6.9  Albumin 3.5 - 5.2 g/dL 4.0 4.0 4.0  AST 0 - 37 U/L _0 ALT 0 - 35 U/L _1 Alk Phosphatase 39 - 117 U/L 109 109 72  Total Bilirubin 0.2 - 1.2 mg/dL 0.5 0.5 0.3  Bilirubin, Direct 0.0 - 0.3 mg/dL 0.1 0.1 0.1    Lab Results  Component Value Date/Time   TSH 1.130 01/26/2019 03:45 PM   TSH 1.27 01/04/2016 11:20 AM   FREET4 1.39 01/26/2019 03:45 PM  CBC Latest Ref Rng & Units 01/26/2019 01/15/2016 01/08/2016  WBC 3.4 - 10.8 x10E3/uL 9.3 6.6 20.1(H)  Hemoglobin 11.1 - 15.9 g/dL 11.2 11.2(L) 11.7(L)  Hematocrit 34.0 - 46.6 % 36.2 34.0(L) 36.8  Platelets 150.0 - 400.0 K/uL - 379.0 483(H)    No results found for: VD25OH  Clinical ASCVD: No  The 10-year ASCVD risk score Mikey Bussing DC Jr., et al., 2013) is: 43.4%   Values used to calculate the score:     Age: 56 years     Sex: Female     Is Non-Hispanic African American: No     Diabetic: Yes     Tobacco smoker: No     Systolic Blood Pressure: 347 mmHg     Is BP treated: Yes     HDL Cholesterol: 34.2  mg/dL     Total Cholesterol: 142 mg/dL    Depression screen [facility] 2/9 02/28/2020 01/26/2019 07/24/2017  Decreased Interest 0 1 0  Down, Depressed, Hopeless 0 0 0  PHQ - 2 Score 0 1 0  Altered sleeping 0 0 -  Tired, decreased energy 0 1 -  Change in appetite 0 1 -  Feeling bad or failure about yourself  0 0 -  Trouble concentrating 0 0 -  Moving slowly or fidgety/restless 0 0 -  Suicidal thoughts 0 0 -  PHQ-9 Score 0 3 -  Difficult doing work/chores Not difficult at all Not difficult at all -      Social History   Tobacco Use  Smoking Status Never  Smokeless Tobacco Never   BP Readings from Last 3 Encounters:  07/30/20 138/78  10/31/19 118/66  08/01/19 112/72   Pulse Readings from Last 3 Encounters:  07/30/20 73  10/31/19 99  08/01/19 78   Wt Readings from Last 3 Encounters:  07/30/20 198 lb (89.8 kg)  04/25/19 231 lb (104.8 kg)  03/24/19 215 lb (97.5 kg)   BMI Readings from Last 3 Encounters:  07/30/20 31.01 kg/m  04/25/19 36.18 kg/m  04/11/19 33.67 kg/m    Assessment/Interventions: Review of patient past medical history, allergies, medications, health status, including review of consultants reports, laboratory and other test data, was performed as part of comprehensive evaluation and provision of chronic care management services.   SDOH:  (Social Determinants of Health) assessments and interventions performed: No  SDOH Screenings   Alcohol Screen: Low Risk    Last Alcohol Screening Score (AUDIT): 0  Depression (PHQ2-9): Low Risk    PHQ-2 Score: 0  Financial Resource Strain: Low Risk    Difficulty of Paying Living Expenses: Not hard at all  Food Insecurity: No Food Insecurity   Worried About Charity fundraiser in the Last Year: Never true   Ran Out of Food in the Last Year: Never true  Housing: Low Risk    Last Housing Risk Score: 0  Physical Activity: Insufficiently Active   Days of Exercise per Week: 3 days   Minutes of Exercise per Session: 20 min   Social Connections: Moderately Integrated   Frequency of Communication with Friends and Family: More than three times a week   Frequency of Social Gatherings with Friends and Family: More than three times a week   Attends Religious Services: More than 4 times per year   Active Member of [employer] or Organizations: No   Attends Archivist Meetings: Never   Marital Status: Married  Stress: No Stress Concern Present   Feeling of Stress : Not at all  Tobacco  Use: Low Risk    Smoking Tobacco Use: Never   Smokeless Tobacco Use: Never  Transportation Needs: No Transportation Needs   Lack of Transportation (Medical): No   Lack of Transportation (Non-Medical): No    CCM Care Plan  Allergies  Allergen Reactions   Morphine Shortness Of Breath   Onglyza [Saxagliptin] Other (See Comments)    Headache within 45 minutes of taking this.   Baclofen     GI upset   Lisinopril Cough   Metoclopramide Hcl     Reglan, irritable   Penicillins     " MAJOR CASE OF HIVES"    Medications Reviewed Today     Reviewed by [name], MD (Physician) on 07/30/20 at [phone]  Med List Status: <None>   Medication Order Taking? Sig Documenting Provider Last Dose Status Informant  Accu-Chek FastClix Lancets MISC [id number] Yes Use to check blood sugar once daily. Dx Code B55.97 [name], MD Taking Active   acetaminophen (TYLENOL) 500 MG tablet [id number] Yes Take 500 mg by mouth every 6 (six) hours as needed for mild pain. Pain  [provider] Taking Active Self  Alcohol Swabs PADS [id number] Yes Use as directed [name], MD Taking Active   aspirin 81 MG tablet [id number] Yes Take 81 mg by mouth daily. [provider] Taking Active Self  Blood Glucose Calibration (TRUE METRIX LEVEL 1) Low SOLN [id number] Yes Use as directed [name], MD Taking Active   Blood Glucose Monitoring Suppl (ACCU-CHEK NANO SMARTVIEW) w/Device KIT [id number] Yes Use to check blood  glucose 2 times daily. [name], MD Taking Active   Blood Glucose Monitoring Suppl ([address] Antonietta Breach) DEVI [id number] Yes Use as directed [name], MD Taking Active   cetirizine (ZYRTEC) 10 MG tablet [id number] Yes Take 1 tablet (10 mg total) by mouth daily as needed. allergies Burchette, Alinda Sierras, MD Taking Active   Cholecalciferol (VITAMIN D) 2000 UNITS CAPS [id number] Yes Take 2,000 Units by mouth. Takes only on days Monday through Thursday [provider] Taking Active Self  Dulaglutide (TRULICITY) 4.82 LM/7.8ML SOPN [id number] Yes Inject 0.75 mg into the skin once a week. [name], MD Taking Active   furosemide (LASIX) 80 MG tablet [id number] Yes TAKE 1 TABLET(80 MG) BY MOUTH TWICE DAILY Burchette, Alinda Sierras, MD Taking Active   gabapentin (NEURONTIN) 100 MG capsule [id number] Yes TAKE 1 TO 2 CAPSULES BY MOUTH AT NIGHT AS NEEDED FOR NEUROPATHIC PAIN Burchette, Alinda Sierras, MD Taking Active   glucose blood (ACCU-CHEK SMARTVIEW) test strip [id number] Yes USE TO CHECK BLOOD GLUCOSE TWICE DAILY AS DIRECTED Burchette, Alinda Sierras, MD Taking Active   glucose blood (TRUE METRIX BLOOD GLUCOSE TEST) test strip [id number] Yes Use as instructed twice a day [name], MD Taking Active   losartan (COZAAR) 100 MG tablet [id number] Yes TAKE 1 TABLET(100 MG) BY MOUTH DAILY Burchette, Alinda Sierras, MD Taking Active   metFORMIN (GLUCOPHAGE) 500 MG tablet [id number] Yes TAKE 2 TABLETS BY MOUTH EVERY MORNING AND EVERY EVENING Burchette, Alinda Sierras, MD Taking Active   Multiple Vitamin (MULTIVITAMIN) capsule [id number] Yes Take 1 capsule by mouth daily. [provider] Taking Active Self  Omega-3 Fatty Acids (FISH OIL) 1200 MG CAPS [id number] Yes Take 1,200 mg by mouth 2 (two) times daily. [provider] Taking Active Self  polyethylene glycol powder (GLYCOLAX/MIRALAX) powder [id number] Yes Take 17 g by mouth daily as needed. One capfull daily by mouth with liquid [provider]  Taking Active Self  potassium citrate (UROCIT-K) 10 MEQ (1080 MG) SR tablet [id number] Yes Take 10 mEq by mouth 3 (three) times daily with meals. [provider] Taking Active   senna (SENOKOT) 8.6 MG tablet [id number] Yes Take 1 tablet by mouth daily as needed.  [provider] Taking Active   simvastatin (ZOCOR) 40 MG tablet [id number] Yes TAKE 1 TABLET(40 MG) BY MOUTH DAILY Burchette, Alinda Sierras, MD Taking Active   sodium chloride (OCEAN) 0.65 % nasal spray [id number] Yes Place 1-2 sprays into the nose as needed. Allergies  [provider] Taking Active Self  TRUEplus Lancets 28G [place] [id number] Yes Use as directed [name], MD Taking Active             Patient Active Problem List   Diagnosis Date Noted   Obesity (BMI 30.0-34.9) 04/26/2019   Venous stasis of both lower extremities 04/26/2019   Leg wound, right, subsequent encounter 12/01/2016   Hx of colonic polyps    Type 2 diabetes mellitus with diabetic chronic kidney disease ([place]) 08/04/2014   Pyelonephritis 07/08/2014   History of kidney stones 06/12/2014   Hypercalcemia 06/12/2014   Sepsis secondary to UTI ([place]) 12/03/2011   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   Type 2 diabetes mellitus ([place]) 10/09/2010   Infantile cerebral palsy ([place]) 07/11/2010   Hyperlipidemia 11/29/2009   Essential hypertension 11/29/2009    Immunization History  Administered Date(s) Administered   Fluad Quad(high Dose 65+) 04/26/2019, 07/30/2020   Influenza Split 05/04/2011   Influenza Whole 04/16/2010   Influenza, High Dose Seasonal PF 04/15/2013, 05/14/2015, 04/10/2017, 03/10/2018   Influenza, Seasonal, Injecte, Preservative Fre 06/01/2012   Influenza,inj,Quad PF,6+ Mos 02/09/2014   Influenza-Unspecified 02/29/2016   Moderna Sars-Covid-2 Vaccination 07/29/2019, 08/26/2019   PFIZER(Purple Top)SARS-COV-2 Vaccination 04/26/2020   PPD Test 01/07/2011, 01/07/2011, 01/07/2011, 01/07/2011, 01/07/2011   Pneumococcal  Conjugate-13 06/12/2014   Pneumococcal Polysaccharide-23 06/16/2004, 01/07/2011   Td 06/16/2005   Tdap 06/03/2011, 09/27/2016    Conditions to be addressed/monitored:  Hypertension, Hyperlipidemia, Diabetes, Chronic Kidney Disease, Allergic Rhinitis, and Bilateral leg edema and Neuropathy  There are no care plans that you recently modified to display for this patient.   Patient Care Plan: CCM Pharmacy Care Plan     Problem Identified: Problem: Hypertension, Hyperlipidemia, Diabetes, Chronic Kidney Disease, Allergic Rhinitis, and Bilateral leg edema and Neuropathy      Long-Range Goal: Patient-Specific Goal   Start Date: 11/26/2020  Expected End Date: 11/26/2021  This Visit's Progress: On track  Priority: High  Note:   Current Barriers:  Unable to independently monitor therapeutic efficacy Unable to achieve control of diabetes   Pharmacist Clinical Goal(s):  Patient will achieve adherence to monitoring guidelines and medication adherence to achieve therapeutic efficacy achieve control of diabetes as evidenced by A1c  through collaboration with PharmD and provider.   Interventions: 1:1 collaboration with [name], MD regarding development and update of comprehensive plan of care as evidenced by provider attestation and co-signature Inter-disciplinary care team collaboration (see longitudinal plan of care) Comprehensive medication review performed; medication list updated in electronic medical record  Hypertension (BP goal <140/90) -Controlled -Current treatment: Losartan 142m, 1 tablet once daily (in AM) -Medications previously tried: none  -Current home readings: 120/70 (checks once a day or every other) -Current dietary habits: patient has been trying to read package labels for sodium content; doesn't cook with salt at all -Current exercise habits: did not discuss -Denies hypotensive/hypertensive symptoms -Educated on Importance of home blood  pressure  monitoring; Proper BP monitoring technique; Symptoms of hypotension and importance of maintaining adequate hydration; -Counseled to monitor BP at home at least weekly, document, and provide log at future appointments -Counseled on diet and exercise extensively Recommended to continue current medication  Hyperlipidemia: (LDL goal < 70) -Uncontrolled -Current treatment: Simvastatin 68m, 1 tablet once daily (moderate intensity) Omega-3 12082m 1 capsule twice daily -Medications previously tried: none  -Current dietary patterns: did not discuss -Current exercise habits: did not discuss -Educated on Cholesterol goals;  Benefits of statin for ASCVD risk reduction; Importance of limiting foods high in cholesterol; Exercise goal of 150 minutes per week; -Counseled on diet and exercise extensively Recommended to continue current medication  Diabetes (A1c goal <7%) -Uncontrolled -Current medications: dulaglutide (Trulicity) inject 1.5 mg once a week  -Medications previously tried: metformin (kidney function), Victoza (cost)  -Current home glucose readings fasting glucose: 130,110 post prandial glucose: does not check -Denies hypoglycemic/hyperglycemic symptoms -Current meal patterns:  breakfast: did not discuss  lunch: same as dinner  dinner: one serving of vegetables, meat (chicken with some pasta or potato) snacks: did not discuss drinks: water or tea (unsweet or splenda or sweet n low) -Current exercise: did not discuss -Educated on A1c and blood sugar goals; Prevention and management of hypoglycemic episodes; Benefits of routine self-monitoring of blood sugar; Carbohydrate counting and/or plate method -Counseled to check feet daily and get yearly eye exams -Counseled on diet and exercise extensively Recommended to continue current medication Recommended trying vegetable pastas (Banza, red lentil pasta) to cut down on carbs Mailed healthy plate handout  Edema (Goal: minimize  swelling) -Controlled -Current treatment  Furosemide 804m1 tablet daily Potassium citrate 81m57m1 tablet three times daily with meals  -Medications previously tried: none  -Counseled on weighing herself more consistently and to contact the office if she notices > 3 lbs change in 1 day or > 5 lbs in one week; patient tries to be consistent with water intake and 4-5 glasses of water a day; patient also drinks a cup of tea in the morning and evening   Neuropathy (Goal: minimize pain) -Controlled -Current treatment  Gabapentin 100mg61mcapsules at night as needed for neuropathic pain -Medications previously tried: none  -Recommended to continue current medication  Osteoporosis / Osteopenia (Goal prevent fractures) -Not ideally controlled -Last DEXA Scan: could not locate  T-Score femoral neck: n/a  T-Score total hip: n/a  T-Score lumbar spine: n/a  T-Score forearm radius: n/a  10-year probability of major osteoporotic fracture: n/a  10-year probability of hip fracture: n/a -Patient is not a candidate for pharmacologic treatment -Current treatment  Cholecalciferol (vitamin D) 2000 units, 1 capsule on Monday through Thursday  Multivitamin 200 mg calcium (1 calcium) -Medications previously tried: none  -Recommend [phone] units of vitamin D daily. Recommend 1200 mg of calcium daily from dietary and supplemental sources. Recommend weight-bearing and muscle strengthening exercises for building and maintaining bone density. -Counseled on diet and exercise extensively Recommended repeat DEXA  Allergic rhinitis (Goal: minimize symptoms) -Controlled -Current treatment  Cetirizine 81mg,74mablet once daily as needed for allergies Sodium chloride 0.65% nasal spray, instill 1 to 2 sprays into nose as needed for allergies -Medications previously tried: none  -Recommended to continue current medication   Health Maintenance -Vaccine gaps: shingrix, COVID booster -Current therapy:   Acetaminophen 500mg, 43mblet every six hours as needed for mild pain Miralax, 17 g once daily as needed Senna 8.6mg, 1 53mlet once daily Multivitamin, 1 capsule once  daily -Educated on Cost vs benefit of each product must be carefully weighed by individual consumer -Patient is satisfied with current therapy and denies issues -Recommended to continue current medication  Patient Goals/Self-Care Activities Patient will:  - take medications as prescribed check blood pressure weekly, document, and provide at future appointments weigh daily, and contact provider if weight gain of > 3 lbs in one day target a minimum of 150 minutes of moderate intensity exercise weekly  Follow Up Plan: Telephone follow up appointment with care management team member scheduled for: 6 months       Medication Assistance: None required.  Patient affirms current coverage meets needs.  Compliance/Adherence/Medication fill history: Care Gaps: Shingrix, COVID booster  Star-Rating Drugs: Simvastatin - last filled 06/05/20 for 90 ds Losartan 100 mg - last filled 09/27/20 for 90 ds  Patient's preferred pharmacy is:  [id number] [place], [place] AT [address] [place] [address] Phone: [phone] Fax: [phone]  [facility] - [place], [place] - [address] [place] [address] Phone: [phone] Fax: [phone]  Uses pill box? Yes Pt endorses 100% compliance  -Was using mail order - Medicare extra help  We discussed: Benefits of medication synchronization, packaging and delivery as well as enhanced pharmacist oversight with [facility]. Patient decided to: Utilize [facility] pharmacy for medication synchronization, packaging and delivery  Care Plan and Follow Up Patient Decision:  Patient agrees to Care Plan and Follow-up.  Plan: Telephone follow up  appointment with care management team member scheduled for:  6 months  Jeni Salles, PharmD, [place] Pharmacist [place] [facility] [place] [phone]

## 2020-11-26 NOTE — Telephone Encounter (Signed)
noted 

## 2020-11-26 NOTE — Telephone Encounter (Signed)
Please advise 

## 2020-11-26 NOTE — Telephone Encounter (Signed)
The Health Coach from [facility] called to let Dr. Elease Hashimoto know that they have not been able to reach the patient and it's required for them to let the provider know.  The patient also hasn't refilled her Dulaglutide (TRULICITY) 1.5 VO/3.5KK SOPN since 04/22 and her losartan (COZAAR) 100 MG tablet since 04/17

## 2020-11-29 NOTE — Patient Instructions (Signed)
Hi Jordan [name],  It was great to get to speak with you again! Below is a summary of some of the topics we discussed.   Please reach out to me if you have any questions or need anything before our follow up!  Best, Maddie  Jeni Salles, PharmD, [facility] [place]   Visit Information   Goals Addressed   None    Patient Care Plan: CCM Pharmacy Care Plan     Problem Identified: Problem: Hypertension, Hyperlipidemia, Diabetes, Chronic Kidney Disease, Allergic Rhinitis, and Bilateral leg edema and Neuropathy      Long-Range Goal: Patient-Specific Goal   Start Date: 11/26/2020  Expected End Date: 11/26/2021  This Visit's Progress: On track  Priority: High  Note:   Current Barriers:  Unable to independently monitor therapeutic efficacy Unable to achieve control of diabetes   Pharmacist Clinical Goal(s):  Patient will achieve adherence to monitoring guidelines and medication adherence to achieve therapeutic efficacy achieve control of diabetes as evidenced by A1c  through collaboration with PharmD and provider.   Interventions: 1:1 collaboration with Jordan Post, MD regarding development and update of comprehensive plan of care as evidenced by provider attestation and co-signature Inter-disciplinary care team collaboration (see longitudinal plan of care) Comprehensive medication review performed; medication list updated in electronic medical record  Hypertension (BP goal <140/90) -Controlled -Current treatment: Losartan 100mg , 1 tablet once daily (in AM) -Medications previously tried: none  -Current home readings: 120/70 (checks once a day or every other) -Current dietary habits: patient has been trying to read package labels for sodium content; doesn't cook with salt at all -Current exercise habits: did not discuss -Denies hypotensive/hypertensive symptoms -Educated on Importance of home blood pressure  monitoring; Proper BP monitoring technique; Symptoms of hypotension and importance of maintaining adequate hydration; -Counseled to monitor BP at home at least weekly, document, and provide log at future appointments -Counseled on diet and exercise extensively Recommended to continue current medication  Hyperlipidemia: (LDL goal < 70) -Uncontrolled -Current treatment: Simvastatin 40mg , 1 tablet once daily (moderate intensity) Omega-3 1200mg , 1 capsule twice daily -Medications previously tried: none  -Current dietary patterns: did not discuss -Current exercise habits: did not discuss -Educated on Cholesterol goals;  Benefits of statin for ASCVD risk reduction; Importance of limiting foods high in cholesterol; Exercise goal of 150 minutes per week; -Counseled on diet and exercise extensively Recommended to continue current medication  Diabetes (A1c goal <7%) -Uncontrolled -Current medications: dulaglutide (Trulicity) inject 1.5 mg once a week  -Medications previously tried: metformin (kidney function), Victoza (cost)  -Current home glucose readings fasting glucose: 130,110 [name] prandial glucose: does not check -Denies hypoglycemic/hyperglycemic symptoms -Current meal patterns:  breakfast: did not discuss  lunch: same as dinner  dinner: one serving of vegetables, meat (chicken with some pasta or potato) snacks: did not discuss drinks: water or tea (unsweet or splenda or sweet n low) -Current exercise: did not discuss -Educated on A1c and blood sugar goals; Prevention and management of hypoglycemic episodes; Benefits of routine self-monitoring of blood sugar; Carbohydrate counting and/or plate method -Counseled to check feet daily and get yearly eye exams -Counseled on diet and exercise extensively Recommended to continue current medication Recommended trying vegetable pastas (Banza, red lentil pasta) to cut down on carbs Mailed healthy plate handout  Edema (Goal: minimize  swelling) -Controlled -Current treatment  Furosemide 80mg , 1 tablet daily Potassium citrate 56mEq, 1 tablet three times daily with meals  -Medications previously tried: none  -Counseled on weighing  herself more consistently and to contact the office if she notices > 3 lbs change in 1 day or > 5 lbs in one week; patient tries to be consistent with water intake and 4-5 glasses of water a day; patient also drinks a cup of tea in the morning and evening   Neuropathy (Goal: minimize pain) -Controlled -Current treatment  Gabapentin 100mg , 2 capsules at night as needed for neuropathic pain -Medications previously tried: none  -Recommended to continue current medication  Osteoporosis / Osteopenia (Goal prevent fractures) -Not ideally controlled -Last DEXA Scan: could not locate  T-Score femoral neck: n/a  T-Score total hip: n/a  T-Score lumbar spine: n/a  T-Score forearm radius: n/a  10-year probability of major osteoporotic fracture: n/a  10-year probability of hip fracture: n/a -Patient is not a candidate for pharmacologic treatment -Current treatment  Cholecalciferol (vitamin D) 2000 units, 1 capsule on Monday through Thursday  Multivitamin 200 mg calcium (1 calcium) -Medications previously tried: none  -Recommend [phone] units of vitamin D daily. Recommend 1200 mg of calcium daily from dietary and supplemental sources. Recommend weight-bearing and muscle strengthening exercises for building and maintaining bone density. -Counseled on diet and exercise extensively Recommended repeat DEXA  Allergic rhinitis (Goal: minimize symptoms) -Controlled -Current treatment  Cetirizine 10mg , 1 tablet once daily as needed for allergies Sodium chloride 0.65% nasal spray, instill 1 to 2 sprays into nose as needed for allergies -Medications previously tried: none  -Recommended to continue current medication   Health Maintenance -Vaccine gaps: shingrix, COVID booster -Current therapy:   Acetaminophen 500mg , 1 tablet every six hours as needed for mild pain Miralax, 17 g once daily as needed Senna 8.6mg , 1 tablet once daily Multivitamin, 1 capsule once daily -Educated on Cost vs benefit of each product must be carefully weighed by individual consumer -Patient is satisfied with current therapy and denies issues -Recommended to continue current medication  Patient Goals/Self-Care Activities Patient will:  - take medications as prescribed check blood pressure weekly, document, and provide at future appointments weigh daily, and contact provider if weight gain of > 3 lbs in one day target a minimum of 150 minutes of moderate intensity exercise weekly  Follow Up Plan: Telephone follow up appointment with care management team member scheduled for: 6 months       Patient verbalizes understanding of instructions provided today and agrees to view in [place].  Telephone follow up appointment with pharmacy team member scheduled for: 6 months  Viona Gilmore, [facility]

## 2020-11-30 ENCOUNTER — Other Ambulatory Visit: Payer: Self-pay

## 2020-11-30 DIAGNOSIS — E781 Pure hyperglyceridemia: Secondary | ICD-10-CM

## 2020-11-30 MED ORDER — SIMVASTATIN 40 MG PO TABS: ORAL_TABLET | ORAL | 3 refills | Status: DC

## 2020-11-30 MED ORDER — LOSARTAN POTASSIUM 100 MG PO TABS: ORAL_TABLET | ORAL | 1 refills | Status: DC

## 2020-11-30 MED ORDER — TRULICITY 1.5 MG/0.5ML ~~LOC~~ SOAJ: 1.5000 mg | SUBCUTANEOUS | 11 refills | Status: DC

## 2020-11-30 MED ORDER — FUROSEMIDE 80 MG PO TABS: ORAL_TABLET | ORAL | 1 refills | Status: DC

## 2020-12-04 ENCOUNTER — Telehealth: Payer: Self-pay | Admitting: Family Medicine

## 2020-12-04 NOTE — Telephone Encounter (Signed)
Patient states that she received a message stating that her prescriptions have been transferred to [facility]. Patient has never used this service and wants all of her prescriptions to be sent to [facility] on [address] and [address].

## 2020-12-04 NOTE — Telephone Encounter (Signed)
Called patient back about previous message. Patient states she was confused and didn't put the two things together about the conversation from last week for her CCM visit. Patient still wants to try [facility] and is good to keep her prescriptions as is.

## 2020-12-14 ENCOUNTER — Telehealth: Payer: Self-pay | Admitting: Pharmacist

## 2020-12-14 NOTE — Chronic Care Management (AMB) (Addendum)
Chronic Care Management Pharmacy Assistant   Name: Jordan [name]  MRN: [id number] DOB: 10-20-1945  Reason for Encounter: Medication Review/ Medication Coordination Call   Recent office visits:  11/26/20 [facility]all[facility] (PCP office) - seen for type 2 diabetes, chronic kidney disease and hypertension. No medication changes. Follow up in 6 months and patient agreed to be onboarded to [facility].  Recent consult visits:  None.   Hospital visits:  None in previous 6 months  Medications: Outpatient Encounter Medications as of 12/14/2020  Medication Sig   Accu-Chek FastClix Lancets MISC Use to check blood sugar once daily. Dx Code E11.65   acetaminophen (TYLENOL) 500 MG tablet Take 500 mg by mouth every 6 (six) hours as needed for mild pain. Pain    Alcohol Swabs PADS Use as directed   aspirin 81 MG tablet Take 81 mg by mouth daily.   Blood Glucose Calibration (TRUE METRIX LEVEL 1) Low SOLN Use as directed   Blood Glucose Monitoring Suppl (ACCU-CHEK NANO SMARTVIEW) w/Device KIT Use to check blood glucose 2 times daily.   Blood Glucose Monitoring Suppl (TRUE METRIX METER) DEVI Use as directed   cetirizine (ZYRTEC) 10 MG tablet Take 1 tablet (10 mg total) by mouth daily as needed. allergies   Cholecalciferol (VITAMIN D) 2000 UNITS CAPS Take 2,000 Units by mouth. Takes only on days Monday through Thursday   Dulaglutide (TRULICITY) 1.5 IW/8.0HO SOPN Inject 1.5 mg into the skin once a week.   furosemide (LASIX) 80 MG tablet TAKE 1 TABLET(80 MG) BY MOUTH TWICE DAILY   gabapentin (NEURONTIN) 100 MG capsule TAKE 1 TO 2 CAPSULES BY MOUTH AT NIGHT AS NEEDED FOR NEUROPATHIC PAIN   glucose blood (ACCU-CHEK SMARTVIEW) test strip USE TO CHECK BLOOD GLUCOSE TWICE DAILY AS DIRECTED   glucose blood (TRUE METRIX BLOOD GLUCOSE TEST) test strip Use as instructed twice a day   losartan (COZAAR) 100 MG tablet TAKE 1 TABLET(100 MG) BY MOUTH DAILY   metFORMIN (GLUCOPHAGE) 500 MG tablet TAKE 2 TABLETS  BY MOUTH EVERY MORNING AND EVERY EVENING   Multiple Vitamin (MULTIVITAMIN) capsule Take 1 capsule by mouth daily.   Omega-3 Fatty Acids (FISH OIL) 1200 MG CAPS Take 1,200 mg by mouth 2 (two) times daily.   polyethylene glycol powder (GLYCOLAX/MIRALAX) powder Take 17 g by mouth daily as needed. One capfull daily by mouth with liquid   potassium citrate (UROCIT-K) 10 MEQ (1080 MG) SR tablet Take 10 mEq by mouth 3 (three) times daily with meals.   senna (SENOKOT) 8.6 MG tablet Take 1 tablet by mouth daily as needed.    simvastatin (ZOCOR) 40 MG tablet TAKE 1 TABLET(40 MG) BY MOUTH DAILY   sodium chloride (OCEAN) 0.65 % nasal spray Place 1-2 sprays into the nose as needed. Allergies    TRUEplus Lancets 28G MISC Use as directed   No facility-administered encounter medications on file as of 12/14/2020.    BP Readings from Last 3 Encounters:  07/30/20 138/78  10/31/19 118/66  08/01/19 112/72    Lab Results  Component Value Date   HGBA1C 7.5 (H) 07/30/2020     Patient obtains medications through Adherence Packaging  30 Days   Last adherence delivery included: None. Patient newly onboarded to [facility].   Patient is due for adherence delivery on: 12/25/20. Called patient and reviewed medications and coordinated delivery.  This delivery to include: Simvastatin 89m - take once daily with evening meal. Losartan 1046m- take once daily with evening meal. Furosemide  80mg - take once daily with breakfast Multivitamin (women's one a day) - take once daily with breakfast. Vitamin D 1000units  - take once daily with breakfast. Fish oil 1200mg - take once daily with breakfast.   Close note out per madeline Pryor the clinical pharmacist. Patient will not be utilizing [facility].  Star Rating Drugs:  Chelsie Lowe CMA  Clinical Pharmacist Assistant [phone]  

## 2020-12-19 DIAGNOSIS — Z936 Other artificial openings of urinary tract status: Secondary | ICD-10-CM | POA: Diagnosis not present

## 2020-12-19 DIAGNOSIS — G834 Cauda equina syndrome: Secondary | ICD-10-CM | POA: Diagnosis not present

## 2020-12-19 DIAGNOSIS — G809 Cerebral palsy, unspecified: Secondary | ICD-10-CM | POA: Diagnosis not present

## 2020-12-20 DIAGNOSIS — Z936 Other artificial openings of urinary tract status: Secondary | ICD-10-CM | POA: Diagnosis not present

## 2020-12-20 DIAGNOSIS — G809 Cerebral palsy, unspecified: Secondary | ICD-10-CM | POA: Diagnosis not present

## 2020-12-20 DIAGNOSIS — G834 Cauda equina syndrome: Secondary | ICD-10-CM | POA: Diagnosis not present

## 2021-01-01 DIAGNOSIS — M1990 Unspecified osteoarthritis, unspecified site: Secondary | ICD-10-CM | POA: Diagnosis not present

## 2021-01-01 DIAGNOSIS — Z809 Family history of malignant neoplasm, unspecified: Secondary | ICD-10-CM | POA: Diagnosis not present

## 2021-01-01 DIAGNOSIS — Z79899 Other long term (current) drug therapy: Secondary | ICD-10-CM | POA: Diagnosis not present

## 2021-01-01 DIAGNOSIS — R269 Unspecified abnormalities of gait and mobility: Secondary | ICD-10-CM | POA: Diagnosis not present

## 2021-01-01 DIAGNOSIS — R600 Localized edema: Secondary | ICD-10-CM | POA: Diagnosis not present

## 2021-01-01 DIAGNOSIS — Z823 Family history of stroke: Secondary | ICD-10-CM | POA: Diagnosis not present

## 2021-01-01 DIAGNOSIS — I10 Essential (primary) hypertension: Secondary | ICD-10-CM | POA: Diagnosis not present

## 2021-01-01 DIAGNOSIS — K5900 Constipation, unspecified: Secondary | ICD-10-CM | POA: Diagnosis not present

## 2021-01-01 DIAGNOSIS — E1142 Type 2 diabetes mellitus with diabetic polyneuropathy: Secondary | ICD-10-CM | POA: Diagnosis not present

## 2021-01-01 DIAGNOSIS — E785 Hyperlipidemia, unspecified: Secondary | ICD-10-CM | POA: Diagnosis not present

## 2021-01-25 ENCOUNTER — Telehealth: Payer: Self-pay | Admitting: Family Medicine

## 2021-01-25 NOTE — Telephone Encounter (Signed)
Pt was called earlier by Ramond Craver in regards to scheduling the patient for her AWV.   Will rout this message to [facility]

## 2021-01-25 NOTE — Telephone Encounter (Signed)
Appt 02/08/21 pt aware

## 2021-01-25 NOTE — Telephone Encounter (Signed)
PT called to return the missed call from our office saw nothing in chart.

## 2021-01-25 NOTE — Telephone Encounter (Signed)
Left message for patient to call back and schedule Medicare Annual Wellness Visit (AWV) either virtually or in office.  Left both  my jabber number [phone] and office number    Last AWV 02/28/20  please schedule at anytime with [facility] [place] [facility] or 2   This should be a 45 minute visit.   AWVS appointment can be calendar year pre [place]

## 2021-02-08 ENCOUNTER — Ambulatory Visit: Payer: Medicare HMO

## 2021-02-27 ENCOUNTER — Telehealth: Payer: Self-pay | Admitting: Family Medicine

## 2021-02-27 NOTE — Telephone Encounter (Signed)
Left message asking pt to call [phone]  Please r/s 02/28/21 appointment laura will be out of office

## 2021-02-28 ENCOUNTER — Ambulatory Visit: Payer: Self-pay

## 2021-03-13 ENCOUNTER — Ambulatory Visit: Payer: Self-pay

## 2021-03-13 ENCOUNTER — Other Ambulatory Visit: Payer: Self-pay

## 2021-03-21 ENCOUNTER — Ambulatory Visit: Payer: Self-pay

## 2021-03-21 ENCOUNTER — Telehealth: Payer: Self-pay

## 2021-03-21 NOTE — Telephone Encounter (Signed)
[place] 0900  03/21/2021  Log on link sent patient did not log on.  Called patient with no answer and no voice mail.  Patient may reschedule next available appointment.  L.Carigan Lister,LPN

## 2021-03-22 ENCOUNTER — Telehealth: Payer: Self-pay | Admitting: Pharmacist

## 2021-03-22 NOTE — Chronic Care Management (AMB) (Signed)
Chronic Care Management Pharmacy Assistant   Name: Jordan [name]  MRN: [id number] DOB: 05-15-1946   Reason for Encounter: Disease State / Hypertension and Diabetes Assessment Call   Conditions to be addressed/monitored: HTN and DMII   Recent office visits:  None  Recent consult visits:  None  Hospital visits:  None in previous 6 months  Medications: Outpatient Encounter Medications as of 03/22/2021  Medication Sig   Accu-Chek FastClix Lancets MISC Use to check blood sugar once daily. Dx Code E11.65   acetaminophen (TYLENOL) 500 MG tablet Take 500 mg by mouth every 6 (six) hours as needed for mild pain. Pain    Alcohol Swabs PADS Use as directed   aspirin 81 MG tablet Take 81 mg by mouth daily.   Blood Glucose Calibration (TRUE METRIX LEVEL 1) Low SOLN Use as directed   Blood Glucose Monitoring Suppl (ACCU-CHEK NANO SMARTVIEW) w/Device KIT Use to check blood glucose 2 times daily.   Blood Glucose Monitoring Suppl (TRUE METRIX METER) DEVI Use as directed   cetirizine (ZYRTEC) 10 MG tablet Take 1 tablet (10 mg total) by mouth daily as needed. allergies   Cholecalciferol (VITAMIN D) 2000 UNITS CAPS Take 2,000 Units by mouth. Takes only on days Monday through Thursday   Dulaglutide (TRULICITY) 1.5 SW/9.6PR SOPN Inject 1.5 mg into the skin once a week.   furosemide (LASIX) 80 MG tablet TAKE 1 TABLET(80 MG) BY MOUTH TWICE DAILY   gabapentin (NEURONTIN) 100 MG capsule TAKE 1 TO 2 CAPSULES BY MOUTH AT NIGHT AS NEEDED FOR NEUROPATHIC PAIN   glucose blood (ACCU-CHEK SMARTVIEW) test strip USE TO CHECK BLOOD GLUCOSE TWICE DAILY AS DIRECTED   glucose blood (TRUE METRIX BLOOD GLUCOSE TEST) test strip Use as instructed twice a day   losartan (COZAAR) 100 MG tablet TAKE 1 TABLET(100 MG) BY MOUTH DAILY   metFORMIN (GLUCOPHAGE) 500 MG tablet TAKE 2 TABLETS BY MOUTH EVERY MORNING AND EVERY EVENING   Multiple Vitamin (MULTIVITAMIN) capsule Take 1 capsule by mouth daily.   Omega-3 Fatty Acids  (FISH OIL) 1200 MG CAPS Take 1,200 mg by mouth 2 (two) times daily.   polyethylene glycol powder (GLYCOLAX/MIRALAX) powder Take 17 g by mouth daily as needed. One capfull daily by mouth with liquid   potassium citrate (UROCIT-K) 10 MEQ (1080 MG) SR tablet Take 10 mEq by mouth 3 (three) times daily with meals.   senna (SENOKOT) 8.6 MG tablet Take 1 tablet by mouth daily as needed.    simvastatin (ZOCOR) 40 MG tablet TAKE 1 TABLET(40 MG) BY MOUTH DAILY   sodium chloride (OCEAN) 0.65 % nasal spray Place 1-2 sprays into the nose as needed. Allergies    TRUEplus Lancets 28G MISC Use as directed   No facility-administered encounter medications on file as of 03/22/2021.   Fill History: Trulicity 1.5 FF/6.3 mL subcutaneous pen injector 11/30/2020 28   furosemide 80 mg tablet 02/12/2021 90   LOSARTAN 100MG TABLETS 12/25/2020 100   SIMVASTATIN 40MG TABLETS 12/24/2020 90   Reviewed chart prior to disease state call. Spoke with patient regarding BP  Recent Office Vitals: BP Readings from Last 3 Encounters:  07/30/20 138/78  10/31/19 118/66  08/01/19 112/72   Pulse Readings from Last 3 Encounters:  07/30/20 73  10/31/19 99  08/01/19 78    Wt Readings from Last 3 Encounters:  07/30/20 198 lb (89.8 kg)  04/25/19 231 lb (104.8 kg)  03/24/19 215 lb (97.5 kg)     Kidney Function Lab Results  Component  Value Date/Time   CREATININE 1.80 (H) 07/30/2020 09:45 AM   CREATININE 1.41 (H) 10/31/2019 11:08 AM   GFR 27.09 (L) 07/30/2020 09:45 AM   GFRNONAA 37 (L) 01/08/2016 09:38 PM   GFRAA 43 (L) 01/08/2016 09:38 PM    BMP Latest Ref Rng & Units 07/30/2020 10/31/2019 01/11/2019  Glucose 70 - 99 mg/dL 154(H) 141(H) 141(H)  BUN 6 - 23 mg/dL 42(H) 42(H) 49(H)  Creatinine 0.40 - 1.20 mg/dL 1.80(H) 1.41(H) 1.53(H)  Sodium 135 - 145 mEq/L 141 136 138  Potassium 3.5 - 5.1 mEq/L 3.8 4.5 4.0  Chloride 96 - 112 mEq/L 107 109 108  CO2 19 - 32 mEq/L 21 16(L) 18(L)  Calcium 8.4 - 10.5 mg/dL 9.6 9.1 9.4     Current antihypertensive regimen:  losartan (COZAAR) 100 MG TAKE 1 TABLET DAILY  How often are you checking your Blood Pressure? About 2 times per week  Current home BP readings: Patient states blood pressures are on average 120/85, she doesn't record these.  What recent interventions/DTPs have been made by any provider to improve Blood Pressure control since last CPP Visit: None  Any recent hospitalizations or ED visits since last visit with CPP? No  What diet changes have been made to improve Blood Pressure Control?  Patient generally has toast and egg or cereal for breakfast, a salad or 1/2 sandwich for lunch and Meat (chicken, fish or rare a red meat), vegetable and a starch of some sort for dinner.    What exercise is being done to improve your Blood Pressure Control?  Patient is in a wheelchair, she does do sitting exercises daily such as leg and arm lifts.  Adherence Review: Is the patient currently on ACE/ARB medication? Yes Does the patient have >5 day gap between last estimated fill dates? No   Recent Relevant Labs: Lab Results  Component Value Date/Time   HGBA1C 7.5 (H) 07/30/2020 09:45 AM   HGBA1C 7.7 (A) 07/30/2020 09:16 AM   HGBA1C 6.5 (A) 10/31/2019 10:50 AM   HGBA1C 6.8 (H) 12/22/2018 09:11 AM   MICROALBUR 84.1 (H) 12/31/2010 09:03 AM    Kidney Function Lab Results  Component Value Date/Time   CREATININE 1.80 (H) 07/30/2020 09:45 AM   CREATININE 1.41 (H) 10/31/2019 11:08 AM   GFR 27.09 (L) 07/30/2020 09:45 AM   GFRNONAA 37 (L) 01/08/2016 09:38 PM   GFRAA 43 (L) 01/08/2016 09:38 PM    Current antihyperglycemic regimen:  Trulicity 1.5 KV/4.2 ml - Inject 1.5 mg into the skin once a week  What recent interventions/DTPs have been made to improve glycemic control:  Patient states Trulicity was started to replace Metformin.  She is no longer taking Metformin.  Have there been any recent hospitalizations or ED visits since last visit with CPP?  No  Patient denies hypoglycemic symptoms, including None  Patient denies hyperglycemic symptoms, including none  How often are you checking your blood sugar? Patient is checking BS twice daily  What are your blood sugars ranging?  Fasting: 120-130 After meals: 85-100  During the week, how often does your blood glucose drop below 70? Never  Are you checking your feet daily/regularly?  She and her husband check daily  Adherence Review: Is the patient currently on a STATIN medication? Yes Is the patient currently on ACE/ARB medication? Yes Does the patient have >5 day gap between last estimated fill dates? No   Care Gaps: AWV - message sent to Ramond Craver Shingrix - never done Covid vaccine booster 4 -  overdue HGA1C - overdue - last reading 07/30/2020 at 7.5 Last BP readings was 138/78 on 07/30/2020   Star Rating Drugs: Dulaglutide (TRULICITY) 1.5 RE/0.7C - last filled 11/30/2020 28DS at [facility] losartan (COZAAR) 100 MG - last filled 12/25/2020 100DS at [facility] simvastatin (ZOCOR) 40 MG - last filled 12/24/2020 90DS at [facility] [phone]

## 2021-04-03 ENCOUNTER — Ambulatory Visit (INDEPENDENT_AMBULATORY_CARE_PROVIDER_SITE_OTHER): Payer: Medicare HMO

## 2021-04-03 ENCOUNTER — Other Ambulatory Visit: Payer: Self-pay

## 2021-04-03 DIAGNOSIS — Z0000 Encounter for general adult medical examination without abnormal findings: Secondary | ICD-10-CM

## 2021-04-03 NOTE — Patient Instructions (Signed)
Jordan [name] , Thank you for taking time to come for your Medicare Wellness Visit. I appreciate your ongoing commitment to your health goals. Please review the following plan we discussed and let me know if I can assist you in the future.   Screening recommendations/referrals: Colonoscopy: Done 12/07/14 repeat as directed by provider Mammogram: done 03/13/20 repeat every year Bone Density: done 02/05/16 repeat every 2 years Recommended yearly ophthalmology/optometry visit for glaucoma screening and checkup Recommended yearly dental visit for hygiene and checkup  Vaccinations: Influenza vaccine: completed 07/30/20 Pneumococcal vaccine: Up to date Tdap vaccine: done 09/27/16 repeat every 10 years  Shingles vaccine: Shingrix discussed. Please contact your pharmacy for coverage information.    Covid-19:completed 2/12, 3/12, &04/26/20  Advanced directives: Please bring a copy of your health care power of attorney and living will to the office at your convenience.  Conditions/risks identified: lose weight  Next appointment: Follow up in one year for your annual wellness visit    Preventive Care 65 Years and Older, Female Preventive care refers to lifestyle choices and visits with your health care provider that can promote health and wellness. What does preventive care include? A yearly physical exam. This is also called an annual well check. Dental exams once or twice a year. Routine eye exams. Ask your health care provider how often you should have your eyes checked. Personal lifestyle choices, including: Daily care of your teeth and gums. Regular physical activity. Eating a healthy diet. Avoiding tobacco and drug use. Limiting alcohol use. Practicing safe sex. Taking low-dose aspirin every day. Taking vitamin and mineral supplements as recommended by your health care provider. What happens during an annual well check? The services and screenings done by your health care provider during  your annual well check will depend on your age, overall health, lifestyle risk factors, and family history of disease. Counseling  Your health care provider may ask you questions about your: Alcohol use. Tobacco use. Drug use. Emotional well-being. Home and relationship well-being. Sexual activity. Eating habits. History of falls. Memory and ability to understand (cognition). Work and work Statistician. Reproductive health. Screening  You may have the following tests or measurements: Height, weight, and BMI. Blood pressure. Lipid and cholesterol levels. These may be checked every 5 years, or more frequently if you are over 19 years old. Skin check. Lung cancer screening. You may have this screening every year starting at age 65 if you have a 30-pack-year history of smoking and currently smoke or have quit within the past 15 years. Fecal occult blood test (FOBT) of the stool. You may have this test every year starting at age 33. Flexible sigmoidoscopy or colonoscopy. You may have a sigmoidoscopy every 5 years or a colonoscopy every 10 years starting at age 20. Hepatitis C blood test. Hepatitis B blood test. Sexually transmitted disease (STD) testing. Diabetes screening. This is done by checking your blood sugar (glucose) after you have not eaten for a while (fasting). You may have this done every 1-3 years. Bone density scan. This is done to screen for osteoporosis. You may have this done starting at age 69. Mammogram. This may be done every 1-2 years. Talk to your health care provider about how often you should have regular mammograms. Talk with your health care provider about your test results, treatment options, and if necessary, the need for more tests. Vaccines  Your health care provider may recommend certain vaccines, such as: Influenza vaccine. This is recommended every year. Tetanus, diphtheria, and acellular pertussis (  Tdap, Td) vaccine. You may need a Td booster every 10  years. Zoster vaccine. You may need this after age 26. Pneumococcal 13-valent conjugate (PCV13) vaccine. One dose is recommended after age 67. Pneumococcal polysaccharide (PPSV23) vaccine. One dose is recommended after age 51. Talk to your health care provider about which screenings and vaccines you need and how often you need them. This information is not intended to replace advice given to you by your health care provider. Make sure you discuss any questions you have with your health care provider. Document Released: 06/29/2015 Document Revised: 02/20/2016 Document Reviewed: 04/03/2015 Elsevier Interactive Patient Education  2017 [employer] Prevention in the Home Falls can cause injuries. They can happen to people of all ages. There are many things you can do to make your home safe and to help prevent falls. What can I do on the outside of my home? Regularly fix the edges of walkways and driveways and fix any cracks. Remove anything that might make you trip as you walk through a door, such as a raised step or threshold. Trim any bushes or trees on the path to your home. Use bright outdoor lighting. Clear any walking paths of anything that might make someone trip, such as rocks or tools. Regularly check to see if handrails are loose or broken. Make sure that both sides of any steps have handrails. Any raised decks and porches should have guardrails on the edges. Have any leaves, snow, or ice cleared regularly. Use sand or salt on walking paths during winter. Clean up any spills in your garage right away. This includes oil or grease spills. What can I do in the bathroom? Use night lights. Install grab bars by the toilet and in the tub and shower. Do not use towel bars as grab bars. Use non-skid mats or decals in the tub or shower. If you need to sit down in the shower, use a plastic, non-slip stool. Keep the floor dry. Clean up any water that spills on the floor as soon as it  happens. Remove soap buildup in the tub or shower regularly. Attach bath mats securely with double-sided non-slip rug tape. Do not have throw rugs and other things on the floor that can make you trip. What can I do in the bedroom? Use night lights. Make sure that you have a light by your bed that is easy to reach. Do not use any sheets or blankets that are too big for your bed. They should not hang down onto the floor. Have a firm chair that has side arms. You can use this for support while you get dressed. Do not have throw rugs and other things on the floor that can make you trip. What can I do in the kitchen? Clean up any spills right away. Avoid walking on wet floors. Keep items that you use a lot in easy-to-reach places. If you need to reach something above you, use a strong step stool that has a grab bar. Keep electrical cords out of the way. Do not use floor polish or wax that makes floors slippery. If you must use wax, use non-skid floor wax. Do not have throw rugs and other things on the floor that can make you trip. What can I do with my stairs? Do not leave any items on the stairs. Make sure that there are handrails on both sides of the stairs and use them. Fix handrails that are broken or loose. Make sure that handrails are  as long as the stairways. Check any carpeting to make sure that it is firmly attached to the stairs. Fix any carpet that is loose or worn. Avoid having throw rugs at the top or bottom of the stairs. If you do have throw rugs, attach them to the floor with carpet tape. Make sure that you have a light switch at the top of the stairs and the bottom of the stairs. If you do not have them, ask someone to add them for you. What else can I do to help prevent falls? Wear shoes that: Do not have high heels. Have rubber bottoms. Are comfortable and fit you well. Are closed at the toe. Do not wear sandals. If you use a stepladder: Make sure that it is fully opened.  Do not climb a closed stepladder. Make sure that both sides of the stepladder are locked into place. Ask someone to hold it for you, if possible. Clearly mark and make sure that you can see: Any grab bars or handrails. First and last steps. Where the edge of each step is. Use tools that help you move around (mobility aids) if they are needed. These include: Canes. Walkers. Scooters. Crutches. Turn on the lights when you go into a dark area. Replace any light bulbs as soon as they burn out. Set up your furniture so you have a clear path. Avoid moving your furniture around. If any of your floors are uneven, fix them. If there are any pets around you, be aware of where they are. Review your medicines with your doctor. Some medicines can make you feel dizzy. This can increase your chance of falling. Ask your doctor what other things that you can do to help prevent falls. This information is not intended to replace advice given to you by your health care provider. Make sure you discuss any questions you have with your health care provider. Document Released: 03/29/2009 Document Revised: 11/08/2015 Document Reviewed: 07/07/2014 Elsevier Interactive Patient Education  2017 [employer].

## 2021-04-03 NOTE — Progress Notes (Addendum)
Virtual Visit via Telephone Note  I connected with  Jordan [name] on 04/03/21 at  9:30 AM EDT by telephone and verified that I am speaking with the correct person using two identifiers.  Location: Patient: Home Provider: Office Persons participating in the virtual visit: patient/Nurse Health Advisor   I discussed the limitations, risks, security and privacy concerns of performing an evaluation and management service by telephone and the availability of in person appointments. The patient expressed understanding and agreed to proceed.  Interactive audio and video telecommunications were attempted between this nurse and patient, however failed, due to patient having technical difficulties OR patient did not have access to video capability.  We continued and completed visit with audio only.  Some vital signs may be absent or patient reported.   Willette Brace, LPN   Subjective:   Jordan [name] is a 75 y.o. female who presents for Medicare Annual (Subsequent) preventive examination.  Review of Systems     Cardiac Risk Factors include: advanced age (>42mn, >>3women);hypertension;diabetes mellitus;dyslipidemia;obesity (BMI >30kg/m2)     Objective:    There were no vitals filed for this visit. There is no height or weight on file to calculate BMI.  Advanced Directives 04/03/2021 02/28/2020 07/24/2017 01/09/2017 09/27/2016 01/08/2016 01/04/2016  Does Patient Have a Medical Advance Directive? Yes Yes Yes Yes No Yes Yes  Type of [place] - Living will;Healthcare Power of [place] -  Does patient want to make changes to medical advance directive? - No - Patient declined - - - - -  Copy of [place] Chart? No - copy requested No - copy requested - No - copy requested - - No - copy requested  Would patient like information on creating a medical advance  directive? - - - - No - Patient declined - -    Current Medications (verified) Outpatient Encounter Medications as of 04/03/2021  Medication Sig   Accu-Chek FastClix Lancets MISC Use to check blood sugar once daily. Dx Code E11.65   acetaminophen (TYLENOL) 500 MG tablet Take 500 mg by mouth every 6 (six) hours as needed for mild pain. Pain    Alcohol Swabs PADS Use as directed   aspirin 81 MG tablet Take 81 mg by mouth daily.   Blood Glucose Calibration (TRUE METRIX LEVEL 1) Low SOLN Use as directed   Blood Glucose Monitoring Suppl (ACCU-CHEK NANO SMARTVIEW) w/Device KIT Use to check blood glucose 2 times daily.   Blood Glucose Monitoring Suppl (TRUE METRIX METER) DEVI Use as directed   cetirizine (ZYRTEC) 10 MG tablet Take 1 tablet (10 mg total) by mouth daily as needed. allergies   Cholecalciferol (VITAMIN D) 2000 UNITS CAPS Take 2,000 Units by mouth. Takes only on days Monday through Thursday   Dulaglutide (TRULICITY) 1.5 MCX/4.4YJSOPN Inject 1.5 mg into the skin once a week.   furosemide (LASIX) 80 MG tablet TAKE 1 TABLET(80 MG) BY MOUTH TWICE DAILY   gabapentin (NEURONTIN) 100 MG capsule TAKE 1 TO 2 CAPSULES BY MOUTH AT NIGHT AS NEEDED FOR NEUROPATHIC PAIN   glucose blood (ACCU-CHEK SMARTVIEW) test strip USE TO CHECK BLOOD GLUCOSE TWICE DAILY AS DIRECTED   glucose blood (TRUE METRIX BLOOD GLUCOSE TEST) test strip Use as instructed twice a day   losartan (COZAAR) 100 MG tablet TAKE 1 TABLET(100 MG) BY MOUTH DAILY   Multiple Vitamin (MULTIVITAMIN) capsule Take 1 capsule by mouth daily.  Omega-3 Fatty Acids (FISH OIL) 1200 MG CAPS Take 1,200 mg by mouth 2 (two) times daily.   polyethylene glycol powder (GLYCOLAX/MIRALAX) powder Take 17 g by mouth daily as needed. One capfull daily by mouth with liquid   senna (SENOKOT) 8.6 MG tablet Take 1 tablet by mouth daily as needed.    simvastatin (ZOCOR) 40 MG tablet TAKE 1 TABLET(40 MG) BY MOUTH DAILY   sodium chloride (OCEAN) 0.65 % nasal spray  Place 1-2 sprays into the nose as needed. Allergies    TRUEplus Lancets 28G MISC Use as directed   [DISCONTINUED] metFORMIN (GLUCOPHAGE) 500 MG tablet TAKE 2 TABLETS BY MOUTH EVERY MORNING AND EVERY EVENING (Patient not taking: Reported on 04/03/2021)   [DISCONTINUED] potassium citrate (UROCIT-K) 10 MEQ (1080 MG) SR tablet Take 10 mEq by mouth 3 (three) times daily with meals. (Patient not taking: Reported on 04/03/2021)   No facility-administered encounter medications on file as of 04/03/2021.    Allergies (verified) Morphine, Onglyza [saxagliptin], Baclofen, Lisinopril, Metoclopramide hcl, and Penicillins   History: 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   PT CAN TRANSFER BED TO CHAIR--CAN STAND BRIEFLY--BUT NOT ABLE TO WALK; RT LEG HYPEREXTENDS BACKWARD AND IS WEAK-HAS A POWER CHAIR   COLONIC POLYPS 07/11/2010   Constipation    Diabetes mellitus    ORAL MED FOR DIABETES   Gallbladder problem    GERD (gastroesophageal reflux disease)    PAST HISTORY GERD--WATCHES DIET - NOT ON ANY MEDS FOR GERD   HYPERLIPIDEMIA 11/29/2009   HYPERTENSION 11/29/2009   Incisional hernia    NO PAIN-NOT CAUSING ANY DISCOMFORT   Kidney infection    Osteoarthritis    Osteomyelitis (HCC)    PONV (postoperative nausea and vomiting)    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[place])    11-28-14 remains   Shortness of breath    Stroke (cerebrum) ([place])    Past Surgical History:  Procedure Laterality Date   1992     ABDOMINAL HYSTERECTOMY  1990   partial   [place]     COLONOSCOPY WITH PROPOFOL N/A 12/07/2014   Procedure: COLONOSCOPY WITH PROPOFOL;  Surgeon: Gatha Mayer, MD;  Location: [facility];  Service: Endoscopy;  Laterality: N/A;   LEFT  CARPAL TUNNEL RELEASE X 2     LUMBAR SYMPATHETECTOMIES X 2     MULTIPLE RIGHT ANKLE SURGERIES     NEPHROLITHOTOMY  10/01/2011   Procedure: NEPHROLITHOTOMY PERCUTANEOUS;  Surgeon: Franchot Gallo, MD;  Location: [facility];  Service: Urology;  Laterality: Right;        PARTIAL AMPUTATION OF LEFT GREAT TOE     1985   radical cystectomy  1992   neurogenic bladder sec CP   REPAIR OF NASAL FRACTURE  1985   REVISION UROSTOMY CUTANEOUS  1992   RIGHT CARPAL TUNNEL RELEASE     RIGHT HIP FLEXION CONTRACTURE REPAIR  1986   RT ANKLE FUSION  1975   SEVERAL RT ANKLE SURGERIES PRIOR TO THE FUSION   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--AT [facility]      TONSILLECTOMY     TUBAL LIGATION  1975   VAGINAL HYSTERECTOMY  [place]   Family History  Problem Relation Age of Onset   Heart disease Mother 61       COPD   Hypertension Mother    Hyperlipidemia Mother    Liver disease Mother    Alcoholism Mother    Heart disease Father        COPD    Emphysema Father    Colon cancer Father 77       Died at 76   Bladder Cancer Father    Diabetes Father    Hyperlipidemia Father    Cancer Father    Liver disease Father    Sleep apnea Father    Alcoholism Father    Colon polyps Sister    Heart disease Maternal Grandmother    Heart attack Maternal Grandfather    Esophageal cancer Neg Hx    Kidney disease Neg Hx    Social History   Socioeconomic History   Marital status: Married    Spouse name: Not on file   Number of children: 1   Years of education: Not on file   Highest education level: Not on file  Occupational History   Occupation: Social worker; Retail banker  Tobacco Use   Smoking status: Never   Smokeless tobacco: Never  Vaping Use   Vaping Use: Never used  Substance and Sexual Activity   Alcohol use: No    Alcohol/week: 0.0 standard drinks   Drug use: No   Sexual activity: Not on file  Other Topics Concern   Not on file  Social History Narrative   Married    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 Strain: Low Risk    Difficulty of Paying Living Expenses: Not hard at all  Food Insecurity: No Food Insecurity   Worried About Charity fundraiser in the Last Year: Never true   Arboriculturist in the Last Year: Never true  Transportation Needs: No Transportation Needs   Lack of Transportation (Medical): No   Lack of Transportation (Non-Medical): No  Physical Activity: Insufficiently Active   Days of Exercise per Week: 3 days   Minutes of Exercise per Session: 20 min  Stress: No Stress Concern Present   Feeling of Stress : Not at all  Social Connections: Socially Integrated   Frequency of Communication with Friends and Family: More than three times a week   Frequency of Social Gatherings with Friends and Family: More than three times a week   Attends Religious Services: More than 4 times per year   Active Member of [employer] or Organizations: Yes   Attends Archivist Meetings: 1 to 4 times per year   Marital Status: Married    Tobacco Counseling Counseling given: Not Answered   Clinical Intake:  Pre-visit preparation completed: Yes  Pain : No/denies pain     BMI - recorded: 31.01 Nutritional Status: BMI > 30  Obese Nutritional Risks: None Diabetes: Yes CBG done?: Yes (120) CBG resulted in Enter/ Edit results?: No Did pt. bring in CBG monitor from home?: No  How often do you need to have someone help you when you read instructions, pamphlets, or other written materials from your doctor or pharmacy?: 1 - Never  Diabetic?Nutrition Risk Assessment:  Has the patient had any N/V/D within the last 2 months?  No  Does the patient have any non-healing wounds?  No  Has the patient had any unintentional weight loss or weight gain?  No   Diabetes:  Is the patient diabetic?  Yes  If diabetic, was a CBG obtained today?  Yes  Did the patient  bring in their glucometer from home?  No  How often do you monitor your CBG's? Daily.   Financial Strains and Diabetes Management:  Are you having any financial strains with the device, your supplies or your medication? No .  Does the patient want to be seen by Chronic Care Management for management of their diabetes?  No  Would the patient like to be referred to a Nutritionist or for Diabetic Management?  No   Diabetic Exams:  Diabetic Eye Exam: Completed 07/30/20 Diabetic Foot Exam: Completed 07/30/20   Interpreter Needed?: No  Information entered by :: Charlott Rakes, LPN   Activities of Daily Living In your present state of health, do you have any difficulty performing the following activities: 04/03/2021 07/30/2020  Hearing? Y N  Comment wears hearing aids -  Vision? N N  Difficulty concentrating or making decisions? N N  Walking or climbing stairs? Y Y  Comment wheelchair -  Dressing or bathing? N Y  Doing errands, shopping? N Y  Conservation officer, nature and eating ? N -  Using the Toilet? N -  In the past six months, have you accidently leaked urine? Y -  Comment have a urostomy -  Do you have problems with loss of bowel control? N -  Managing your Medications? N -  Managing your Finances? N -  Housekeeping or managing your Housekeeping? N -  Some recent data might be hidden    Patient Care Team: [name], MD as PCP - General (Family Medicine) Franchot Gallo, MD as Consulting Physician (Urology) Jari Pigg, MD as Consulting Physician (Dermatology) Constance Haw, MD as Consulting Physician (Cardiology) Viona Gilmore, [facility] as Pharmacist (Pharmacist)  Indicate any recent Medical Services you may have received from other than [facility] providers in the past year (date may be approximate).     Assessment:   This is a routine wellness examination for [place].  Hearing/Vision screen Hearing Screening - Comments:: Pt wears hearing aids Vision Screening -  Comments:: Pt follows up with annual eye exams with Dr Herbert Deaner   Dietary issues and exercise activities discussed: Current Exercise Habits: Home exercise routine, Type of exercise: Other - see comments, Time (Minutes): 20, Frequency (Times/Week): 3, Weekly Exercise (Minutes/Week): 60   Goals Addressed             This Visit's Progress    Patient Stated       Lose weight       Depression Screen PHQ 2/9 Scores 04/03/2021 02/28/2020 01/26/2019 07/24/2017 01/09/2017 09/03/2016 01/04/2016  PHQ - 2 Score 0 0 1 0 0 0 0  PHQ- 9 Score - 0 3 - - - -    Fall Risk Fall Risk  04/03/2021 02/28/2020 10/31/2019 07/24/2017 01/09/2017  Falls in the past year? 0 0 0 No No  Number falls in past yr: 0 0 0 - -  Injury with Fall? 0 0 0 - -  Risk for fall due to : Impaired vision;Impaired mobility Impaired mobility;Impaired balance/gait - - -  Risk for fall due to: Comment right ankle is fused - - - -  Follow up Falls prevention discussed Falls evaluation completed;Falls prevention discussed Falls evaluation completed - -    FALL RISK PREVENTION PERTAINING TO THE HOME:  Any stairs  in or around the home? No  If so, are there any without handrails? No  Home free of loose throw rugs in walkways, pet beds, electrical cords, etc? Yes  Adequate lighting in your home to reduce risk of falls? Yes   ASSISTIVE DEVICES UTILIZED TO PREVENT FALLS:  Life alert? No  Use of a cane, walker or w/c? No  Grab bars in the bathroom? Yes  Shower chair or bench in shower? Yes  Elevated toilet seat or a handicapped toilet? Yes   TIMED UP AND GO:  Was the test performed? No .   Cognitive Function: MMSE - Mini Mental State Exam 01/04/2016  Not completed: (No Data)     6CIT Screen 04/03/2021  What Year? 0 points  What month? 0 points  What time? 0 points  Count back from 20 0 points  Months in reverse 2 points  Repeat phrase 0 points  Total Score 2    Immunizations Immunization History  Administered Date(s)  Administered   Fluad Quad(high Dose 65+) 04/26/2019, 07/30/2020   Influenza Split 05/04/2011   Influenza Whole 04/16/2010   Influenza, High Dose Seasonal PF 04/15/2013, 05/14/2015, 04/10/2017, 03/10/2018   Influenza, Seasonal, Injecte, Preservative Fre 06/01/2012   Influenza,inj,Quad PF,6+ Mos 02/09/2014   Influenza-Unspecified 02/29/2016   Moderna Sars-Covid-2 Vaccination 07/29/2019, 08/26/2019   PFIZER(Purple Top)SARS-COV-2 Vaccination 04/26/2020   PPD Test 01/07/2011, 01/07/2011, 01/07/2011, 01/07/2011, 01/07/2011   Pneumococcal Conjugate-13 06/12/2014   Pneumococcal Polysaccharide-23 06/16/2004, 01/07/2011   Td 06/16/2005   Tdap 06/03/2011, 09/27/2016    TDAP status: Up to date  Flu Vaccine status: Due, Education has been provided regarding the importance of this vaccine. Advised may receive this vaccine at local pharmacy or Health Dept. Aware to provide a copy of the vaccination record if obtained from local pharmacy or Health Dept. Verbalized acceptance and understanding.  Pneumococcal vaccine status: Up to date  Covid-19 vaccine status: Completed vaccines  Qualifies for Shingles Vaccine? Yes   Zostavax completed No   Shingrix Completed?: No.    Education has been provided regarding the importance of this vaccine. Patient has been advised to call insurance company to determine out of pocket expense if they have not yet received this vaccine. Advised may also receive vaccine at local pharmacy or Health Dept. Verbalized acceptance and understanding.  Screening Tests Health Maintenance  Topic Date Due   Zoster Vaccines- Shingrix (1 of 2) Never done   COVID-19 Vaccine (4 - Booster) 07/19/2020   INFLUENZA VACCINE  01/14/2021   HEMOGLOBIN A1C  01/27/2021   FOOT EXAM  07/30/2021   OPHTHALMOLOGY EXAM  07/30/2021   TETANUS/TDAP  09/28/2026   Pneumonia Vaccine 52+ Years old  Completed   DEXA SCAN  Completed   Hepatitis C Screening  Completed   HPV VACCINES  Aged Out    Health  Maintenance  Health Maintenance Due  Topic Date Due   Zoster Vaccines- Shingrix (1 of 2) Never done   COVID-19 Vaccine (4 - Booster) 07/19/2020   INFLUENZA VACCINE  01/14/2021   HEMOGLOBIN A1C  01/27/2021    Colorectal cancer screening: Type of screening: Colonoscopy. Completed 12/07/14 . Repeat every as provider directed  years  Mammogram status: Completed 03/13/20. Repeat every year  Bone Density status: Completed 02/05/16. Results reflect: Bone density results: OSTEOPENIA. Repeat every 2 years.    Additional Screening:  Hepatitis C Screening: Completed 01/15/16  Vision Screening: Recommended annual ophthalmology exams for early detection of glaucoma and other disorders of the eye. Is the patient up  to date with their annual eye exam?  Yes  Who is the provider or what is the name of the office in which the patient attends annual eye exams? Dr Herbert Deaner If pt is not established with a provider, would they like to be referred to a provider to establish care? No .   Dental Screening: Recommended annual dental exams for proper oral hygiene  Community Resource Referral / Chronic Care Management: CRR required this visit?  No   CCM required this visit?  No      Plan:     I have personally reviewed and noted the following in the patient's chart:   Medical and social history Use of alcohol, tobacco or illicit drugs  Current medications and supplements including opioid prescriptions.  Functional ability and status Nutritional status Physical activity Advanced directives List of other physicians Hospitalizations, surgeries, and ER visits in previous 12 months Vitals Screenings to include cognitive, depression, and falls Referrals and appointments  In addition, I have reviewed and discussed with patient certain preventive protocols, quality metrics, and best practice recommendations. A written personalized care plan for preventive services as well as general preventive health  recommendations were provided to patient.     Willette Brace, LPN   75/30/0511   Nurse Notes: None

## 2021-05-22 ENCOUNTER — Other Ambulatory Visit: Payer: Self-pay

## 2021-05-22 DIAGNOSIS — E781 Pure hyperglyceridemia: Secondary | ICD-10-CM

## 2021-05-22 MED ORDER — TRULICITY 1.5 MG/0.5ML ~~LOC~~ SOAJ: 1.5000 mg | SUBCUTANEOUS | 2 refills | Status: DC

## 2021-05-22 MED ORDER — SIMVASTATIN 40 MG PO TABS: ORAL_TABLET | ORAL | 0 refills | Status: DC

## 2021-05-22 MED ORDER — LOSARTAN POTASSIUM 100 MG PO TABS: ORAL_TABLET | ORAL | 0 refills | Status: DC

## 2021-05-22 MED ORDER — GABAPENTIN 100 MG PO CAPS
ORAL_CAPSULE | ORAL | 0 refills | Status: DC
Start: 2021-05-22 — End: 2021-10-25

## 2021-05-22 MED ORDER — FUROSEMIDE 80 MG PO TABS: ORAL_TABLET | ORAL | 0 refills | Status: DC

## 2021-05-24 ENCOUNTER — Telehealth: Payer: Self-pay | Admitting: Pharmacist

## 2021-05-24 NOTE — Chronic Care Management (AMB) (Signed)
    Chronic Care Management Pharmacy Assistant   Name: Emmilyn Crooke  MRN: [id number] DOB: 17-Feb-1946  05/27/2021 APPOINTMENT REMINDER   Called John Giovanni, No answer, left message of appointment on 05/27/2021 at 1:00 via telephone visit with Jeni Salles, Pharm D. Notified to have all medications, supplements, blood pressure and/or blood sugar logs available during appointment and to return call if need to reschedule.  Care Gaps: AWV - scheduled 04/05/2022 Shingrix - never done Covid vaccine booster 4 - overdue HGA1C - overdue - last reading 07/30/2020 at 7.5 Last BP readings was 138/78 on 07/30/2020  Star Rating Drug: Dulaglutide (TRULICITY) 1.5 QI/6.9G - last filled 11/30/2020 28DS at [facility].  Also checked with [facility] per Liliam she has a script on file from July, never picked up.  losartan (COZAAR) 100 MG - last filled 04/03/2021 68DS at [facility] simvastatin (ZOCOR) 40 MG - last filled 04/02/2021 90DS at [facility]  Any gaps in medications fill history? Yes  [place] Pharmacist Assistant [phone]

## 2021-05-27 ENCOUNTER — Ambulatory Visit: Payer: Medicare HMO | Admitting: Pharmacist

## 2021-05-27 DIAGNOSIS — I10 Essential (primary) hypertension: Secondary | ICD-10-CM

## 2021-05-27 DIAGNOSIS — E1122 Type 2 diabetes mellitus with diabetic chronic kidney disease: Secondary | ICD-10-CM

## 2021-05-27 NOTE — Patient Instructions (Addendum)
Hi Jordan [name],  It was great to catch up with you again! Don't forget to bring your blood pressure cuff to your office visit with Dr. Elease Hashimoto next week! Also, don't forget to call [facility] to schedule your bone density scan as you are overdue for this.  Please reach out to me if you have any questions or need anything before our follow up!  Best, Maddie  Jeni Salles, PharmD, [facility]   Visit Information   Goals Addressed   None    Patient Care Plan: [facility]CM Pharmacy Care Plan     Problem Identified: Problem: Hypertension, Hyperlipidemia, Diabetes, Chronic Kidney Disease, Allergic Rhinitis, and Bilateral leg edema and Neuropathy      Long-Range Goal: Patient-Specific Goal   Start Date: 11/26/2020  Expected End Date: 11/26/2021  Recent Progress: On track  Priority: High  Note:   Current Barriers:  Unable to independently monitor therapeutic efficacy Unable to achieve control of diabetes   Pharmacist Clinical Goal(s):  Patient will achieve adherence to monitoring guidelines and medication adherence to achieve therapeutic efficacy achieve control of diabetes as evidenced by A1c  through collaboration with PharmD and provider.   Interventions: 1:1 collaboration with [name], MD regarding development and update of comprehensive plan of care as evidenced by provider attestation and co-signature Inter-disciplinary care team collaboration (see longitudinal plan of care) Comprehensive medication review performed; medication list updated in electronic medical record  Hypertension (BP goal <140/90) -Controlled -Current treatment: Losartan 100mg , 1 tablet once daily (in AM) -Medications previously tried: none  -Current home readings: 120/70 (checks once a day or every other) - BP wrist cuff -Current dietary habits: patient has been trying to read package labels for sodium content; doesn't cook with salt at  all -Current exercise habits: did not discuss -Denies hypotensive/hypertensive symptoms -Educated on Importance of home blood pressure monitoring; Proper BP monitoring technique; Symptoms of hypotension and importance of maintaining adequate hydration; -Counseled to monitor BP at home at least weekly, document, and provide log at future appointments -Counseled on diet and exercise extensively Recommended to continue current medication  Hyperlipidemia: (LDL goal < 70) -Uncontrolled -Current treatment: Simvastatin 40mg , 1 tablet once daily (moderate intensity) Omega-3 1200mg , 1 capsule twice daily -Medications previously tried: none  -Current dietary patterns: did not discuss -Current exercise habits: did not discuss -Educated on Cholesterol goals;  Benefits of statin for ASCVD risk reduction; Importance of limiting foods high in cholesterol; Exercise goal of 150 minutes per week; -Counseled on diet and exercise extensively Recommended to continue current medication  Diabetes (A1c goal <7%) -Uncontrolled -Current medications: dulaglutide (Trulicity) inject 1.5 mg once a week  -Medications previously tried: metformin (kidney function), Victoza (cost)  -Current home glucose readings fasting glucose: 110-120s (checking a couple times a day) post prandial glucose: lower than first thing in the morning (checking around dinner time sometimes) -Denies hypoglycemic/hyperglycemic symptoms -Current meal patterns:  breakfast: did not discuss  lunch: same as dinner  dinner: one serving of vegetables, meat (chicken with some pasta or potato) snacks: did not discuss drinks: water or tea (unsweet or splenda or sweet n low) -Current exercise: did not discuss -Educated on A1c and blood sugar goals; Prevention and management of hypoglycemic episodes; Benefits of routine self-monitoring of blood sugar; Carbohydrate counting and/or plate method -Counseled to check feet daily and get yearly eye  exams -Counseled on diet and exercise extensively Recommended to continue current medication Recommended trying vegetable pastas (Banza, red lentil pasta) to cut  down on carbs  Edema (Goal: minimize swelling) -Controlled -Current treatment  Furosemide 80mg , 1 tablet daily Potassium citrate 45mEq, 1 tablet three times daily with meals  -Medications previously tried: none  -Counseled on weighing herself more consistently and to contact the office if she notices > 3 lbs change in 1 day or > 5 lbs in one week; patient tries to be consistent with water intake and 4-5 glasses of water a day; patient also drinks a cup of tea in the morning and evening   Neuropathy (Goal: minimize pain) -Controlled -Current treatment  Gabapentin 100mg , 2 capsules at night as needed for neuropathic pain -Medications previously tried: none  -Recommended to continue current medication  Osteoporosis / Osteopenia (Goal prevent fractures) -Not ideally controlled -Last DEXA Scan: could not locate  T-Score femoral neck: n/a  T-Score total hip: n/a  T-Score lumbar spine: n/a  T-Score forearm radius: n/a  10-year probability of major osteoporotic fracture: n/a  10-year probability of hip fracture: n/a -Patient is not a candidate for pharmacologic treatment -Current treatment  Cholecalciferol (vitamin D) 2000 units, 1 capsule on Monday through Thursday  Multivitamin 200 mg calcium (1 calcium) -Medications previously tried: none  -Recommend [phone] units of vitamin D daily. Recommend 1200 mg of calcium daily from dietary and supplemental sources. Recommend weight-bearing and muscle strengthening exercises for building and maintaining bone density. -Counseled on diet and exercise extensively Recommended repeat DEXA  Allergic rhinitis (Goal: minimize symptoms) -Controlled -Current treatment  Cetirizine 10mg , 1 tablet once daily as needed for allergies Sodium chloride 0.65% nasal spray, instill 1 to 2 sprays into  nose as needed for allergies -Medications previously tried: none  -Recommended to continue current medication   Health Maintenance -Vaccine gaps: shingrix, COVID booster -Current therapy:  Acetaminophen 500mg , 1 tablet every six hours as needed for mild pain Miralax, 17 g once daily as needed Senna 8.6mg , 1 tablet once daily Multivitamin, 1 capsule once daily -Educated on Cost vs benefit of each product must be carefully weighed by individual consumer -Patient is satisfied with current therapy and denies issues -Recommended to continue current medication  Patient Goals/Self-Care Activities Patient will:  - take medications as prescribed check blood pressure weekly, document, and provide at future appointments weigh daily, and contact provider if weight gain of > 3 lbs in one day target a minimum of 150 minutes of moderate intensity exercise weekly  Follow Up Plan: Telephone follow up appointment with care management team member scheduled for: 6 months       Patient verbalizes understanding of instructions provided today and agrees to view in [place].  Telephone follow up appointment with pharmacy team member scheduled for: 6 months  Viona Gilmore, [facility]

## 2021-05-27 NOTE — Progress Notes (Signed)
Chronic Care Management Pharmacy Note  05/27/2021 Name:  Jordan [name] MRN:  [id number] DOB:  [birth year]  Summary: A1c not at goal < 7% Pt is not taking Trulicity consistently  Recommendations/Changes made from today's visit: -Recommended bringing BP cuff to PCP visit next week -Recommended repeat DEXA scan and to schedule with [facility]  Plan: Scheduled PCP follow up DM and BP assessment in 3 months Plan to follow up in 6 months   Subjective: Jordan [name] is an 75 y.o. year old female who is a primary patient of Burchette, Alinda Sierras, MD.  The CCM team was consulted for assistance with disease management and care coordination needs.    Engaged with patient by telephone for follow up visit in response to provider referral for pharmacy case management and/or care coordination services.   Consent to Services:  The patient was given information about Chronic Care Management services, agreed to services, and gave verbal consent prior to initiation of services.  Please see initial visit note for detailed documentation.   Patient Care Team: Jordan Post, MD as PCP - General (Family Medicine) Franchot Gallo, MD as Consulting Physician (Urology) Jari Pigg, MD as Consulting Physician (Dermatology) Constance Haw, MD as Consulting Physician (Cardiology) Viona Gilmore, [facility] as Pharmacist (Pharmacist)  Recent office visits: 03/24/21 Jordan Rakes, LPN: Patient presented for AWV.  07/30/20 Jordan Littler, MD: Patient presented for DM follow up. D/C'd metformin due to kidney function and increased Trulicity to 1.5 mg weekly.  Recent consult visits: 01/01/21 Jordan [name]: Patient presented for initial visit. Unable to access notes.  07/29/2019- Urology- Franchot Gallo- Patient presented for office visit for calculus of kidney and history of UTI. Unable to access notes.  Hospital visits: None in previous 6 months   Objective:  Lab Results  Component Value Date    CREATININE 1.80 (H) 07/30/2020   BUN 42 (H) 07/30/2020   GFR 27.09 (L) 07/30/2020   GFRNONAA 37 (L) 01/08/2016   GFRAA 43 (L) 01/08/2016   NA 141 07/30/2020   K 3.8 07/30/2020   CALCIUM 9.6 07/30/2020   CO2 21 07/30/2020   GLUCOSE 154 (H) 07/30/2020    Lab Results  Component Value Date/Time   HGBA1C 7.5 (H) 07/30/2020 09:45 AM   HGBA1C 7.7 (A) 07/30/2020 09:16 AM   HGBA1C 6.5 (A) 10/31/2019 10:50 AM   HGBA1C 6.8 (H) 12/22/2018 09:11 AM   GFR 27.09 (L) 07/30/2020 09:45 AM   GFR 36.32 (L) 10/31/2019 11:08 AM   MICROALBUR 84.1 (H) 12/31/2010 09:03 AM    Last diabetic Eye exam:  Lab Results  Component Value Date/Time   HMDIABEYEEXA No Retinopathy 07/30/2020 12:00 AM    Last diabetic Foot exam:  Lab Results  Component Value Date/Time   HMDIABFOOTEX normal 12/20/2014 12:00 AM     Lab Results  Component Value Date   CHOL 142 07/30/2020   HDL 34.20 (L) 07/30/2020   LDLCALC 60 01/26/2019   LDLDIRECT 89.0 07/30/2020   TRIG 211.0 (H) 07/30/2020   CHOLHDL 4 07/30/2020    Hepatic Function Latest Ref Rng & Units 07/30/2020 12/22/2018 10/09/2017  Total Protein 6.0 - 8.3 g/dL 7.1 7.0 6.9  Albumin 3.5 - 5.2 g/dL 4.0 4.0 4.0  AST 0 - 37 U/L _0 ALT 0 - 35 U/L _1 Alk Phosphatase 39 - 117 U/L 109 109 72  Total Bilirubin 0.2 - 1.2 mg/dL 0.5 0.5 0.3  Bilirubin, Direct 0.0 - 0.3 mg/dL 0.1 0.1 0.1  Lab Results  Component Value Date/Time   TSH 1.130 01/26/2019 03:45 PM   TSH 1.27 01/04/2016 11:20 AM   FREET4 1.39 01/26/2019 03:45 PM    CBC Latest Ref Rng & Units 01/26/2019 01/15/2016 01/08/2016  WBC 3.4 - 10.8 x10E3/uL 9.3 6.6 20.1(H)  Hemoglobin 11.1 - 15.9 g/dL 11.2 11.2(L) 11.7(L)  Hematocrit 34.0 - 46.6 % 36.2 34.0(L) 36.8  Platelets 150.0 - 400.0 K/uL - 379.0 483(H)    No results found for: VD25OH  Clinical ASCVD: No  The 10-year ASCVD risk score (Arnett DK, et al., 2019) is: 47%   Values used to calculate the score:     Age: 41 years     Sex: Female      Is Non-Hispanic African American: No     Diabetic: Yes     Tobacco smoker: No     Systolic Blood Pressure: 024 mmHg     Is BP treated: Yes     HDL Cholesterol: 34.2 mg/dL     Total Cholesterol: 142 mg/dL    Depression screen [facility] 2/9 04/03/2021 02/28/2020 01/26/2019  Decreased Interest 0 0 1  Down, Depressed, Hopeless 0 0 0  PHQ - 2 Score 0 0 1  Altered sleeping - 0 0  Tired, decreased energy - 0 1  Change in appetite - 0 1  Feeling bad or failure about yourself  - 0 0  Trouble concentrating - 0 0  Moving slowly or fidgety/restless - 0 0  Suicidal thoughts - 0 0  PHQ-9 Score - 0 3  Difficult doing work/chores - Not difficult at all Not difficult at all      Social History   Tobacco Use  Smoking Status Never  Smokeless Tobacco Never   BP Readings from Last 3 Encounters:  07/30/20 138/78  10/31/19 118/66  08/01/19 112/72   Pulse Readings from Last 3 Encounters:  07/30/20 73  10/31/19 99  08/01/19 78   Wt Readings from Last 3 Encounters:  07/30/20 198 lb (89.8 kg)  04/25/19 231 lb (104.8 kg)  03/24/19 215 lb (97.5 kg)   BMI Readings from Last 3 Encounters:  07/30/20 31.01 kg/m  04/25/19 36.18 kg/m  04/11/19 33.67 kg/m    Assessment/Interventions: Review of patient past medical history, allergies, medications, health status, including review of consultants reports, laboratory and other test data, was performed as part of comprehensive evaluation and provision of chronic care management services.   SDOH:  (Social Determinants of Health) assessments and interventions performed: No  SDOH Screenings   Alcohol Screen: Not on file  Depression (PHQ2-9): Low Risk    PHQ-2 Score: 0  Financial Resource Strain: Low Risk    Difficulty of Paying Living Expenses: Not hard at all  Food Insecurity: No Food Insecurity   Worried About Charity fundraiser in the Last Year: Never true   Ran Out of Food in the Last Year: Never true  Housing: Low Risk    Last Housing Risk  Score: 0  Physical Activity: Insufficiently Active   Days of Exercise per Week: 3 days   Minutes of Exercise per Session: 20 min  Social Connections: Engineer, building services of Communication with Friends and Family: More than three times a week   Frequency of Social Gatherings with Friends and Family: More than three times a week   Attends Religious Services: More than 4 times per year   Active Member of [employer] or Organizations: Yes   Attends Archivist Meetings: 1 to  4 times per year   Marital Status: Married  Stress: No Stress Concern Present   Feeling of Stress : Not at all  Tobacco Use: Low Risk    Smoking Tobacco Use: Never   Smokeless Tobacco Use: Never   Passive Exposure: Not on file  Transportation Needs: No Transportation Needs   Lack of Transportation (Medical): No   Lack of Transportation (Non-Medical): No    CCM Care Plan  Allergies  Allergen Reactions   Morphine Shortness Of Breath   Onglyza [Saxagliptin] Other (See Comments)    Headache within 45 minutes of taking this.   Baclofen     GI upset   Lisinopril Cough   Metoclopramide Hcl     Reglan, irritable   Penicillins     " MAJOR CASE OF HIVES"    Medications Reviewed Today     Reviewed by Jordan Brace, LPN (Licensed Practical Nurse) on 04/03/21 at [phone]  Med List Status: <None>   Medication Order Taking? Sig Documenting Provider Last Dose Status Informant  Accu-Chek FastClix Lancets MISC [id number] Yes Use to check blood sugar once daily. Dx Code R51.88 Jordan Post, MD Taking Active   acetaminophen (TYLENOL) 500 MG tablet [id number] Yes Take 500 mg by mouth every 6 (six) hours as needed for mild pain. Pain  [provider] Taking Active Self  Alcohol Swabs PADS [id number] Yes Use as directed Jordan Post, MD Taking Active   aspirin 81 MG tablet [id number] Yes Take 81 mg by mouth daily. [provider] Taking Active Self  Blood Glucose Calibration (TRUE METRIX  LEVEL 1) Low SOLN [id number] Yes Use as directed Jordan Post, MD Taking Active   Blood Glucose Monitoring Suppl (ACCU-CHEK NANO SMARTVIEW) w/Device KIT [id number] Yes Use to check blood glucose 2 times daily. Jordan Post, MD Taking Active   Blood Glucose Monitoring Suppl ([address] Antonietta Breach) DEVI [id number] Yes Use as directed Jordan Post, MD Taking Active   cetirizine (ZYRTEC) 10 MG tablet [id number] Yes Take 1 tablet (10 mg total) by mouth daily as needed. allergies Burchette, Alinda Sierras, MD Taking Active   Cholecalciferol (VITAMIN D) 2000 UNITS CAPS [id number] Yes Take 2,000 Units by mouth. Takes only on days Monday through Thursday [provider] Taking Active Self  Dulaglutide (TRULICITY) 1.5 GH/8.2XH SOPN [id number] Yes Inject 1.5 mg into the skin once a week. Jordan Post, MD Taking Active   furosemide (LASIX) 80 MG tablet [id number] Yes TAKE 1 TABLET(80 MG) BY MOUTH TWICE DAILY Burchette, Alinda Sierras, MD Taking Active   gabapentin (NEURONTIN) 100 MG capsule [id number] Yes TAKE 1 TO 2 CAPSULES BY MOUTH AT NIGHT AS NEEDED FOR NEUROPATHIC PAIN Burchette, Alinda Sierras, MD Taking Active   glucose blood (ACCU-CHEK SMARTVIEW) test strip [id number] Yes USE TO CHECK BLOOD GLUCOSE TWICE DAILY AS DIRECTED Burchette, Alinda Sierras, MD Taking Active   glucose blood (TRUE METRIX BLOOD GLUCOSE TEST) test strip [id number] Yes Use as instructed twice a day Jordan Post, MD Taking Active   losartan (COZAAR) 100 MG tablet [id number] Yes TAKE 1 TABLET(100 MG) BY MOUTH DAILY Burchette, Alinda Sierras, MD Taking Active   Multiple Vitamin (MULTIVITAMIN) capsule [id number] Yes Take 1 capsule by mouth daily. [provider] Taking Active Self  Omega-3 Fatty Acids (FISH OIL) 1200 MG CAPS [id number] Yes Take 1,200 mg by mouth 2 (two) times daily. [provider] Taking Active Self  polyethylene glycol powder (GLYCOLAX/MIRALAX) powder [id number] Yes Take 17 g by mouth daily as  needed. One capfull daily by  mouth with liquid [provider] Taking Active Self  senna (SENOKOT) 8.6 MG tablet [id number] Yes Take 1 tablet by mouth daily as needed.  [provider] Taking Active   simvastatin (ZOCOR) 40 MG tablet [id number] Yes TAKE 1 TABLET(40 MG) BY MOUTH DAILY Burchette, Alinda Sierras, MD Taking Active   sodium chloride (OCEAN) 0.65 % nasal spray [id number] Yes Place 1-2 sprays into the nose as needed. 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   Type 2 diabetes mellitus ([place]) 10/09/2010   Infantile cerebral palsy ([place]) 07/11/2010   Hyperlipidemia 11/29/2009   Essential hypertension 11/29/2009    Immunization History  Administered Date(s) Administered   Fluad Quad(high Dose 65+) 04/26/2019, 07/30/2020   Influenza Split 05/04/2011   Influenza Whole 04/16/2010   Influenza, High Dose Seasonal PF 04/15/2013, 05/14/2015, 04/10/2017, 03/10/2018   Influenza, Seasonal, Injecte, Preservative Fre 06/01/2012   Influenza,inj,Quad PF,6+ Mos 02/09/2014   Influenza-Unspecified 02/29/2016   Moderna Sars-Covid-2 Vaccination 07/29/2019, 08/26/2019   PFIZER(Purple Top)SARS-COV-2 Vaccination 04/26/2020   PPD Test 01/07/2011, 01/07/2011, 01/07/2011, 01/07/2011, 01/07/2011   Pneumococcal Conjugate-13 06/12/2014   Pneumococcal Polysaccharide-23 06/16/2004, 01/07/2011   Td 06/16/2005   Tdap 06/03/2011,  09/27/2016    Conditions to be addressed/monitored:  Hypertension, Hyperlipidemia, Diabetes, Chronic Kidney Disease, Allergic Rhinitis, and Bilateral leg edema and Neuropathy  Conditions addressed this visit: Diabetes, hypertension  Care Plan : CCM Pharmacy Care Plan  Updates made by Viona Gilmore, [place] since 05/27/2021 12:00 AM     Problem: Problem: Hypertension, Hyperlipidemia, Diabetes, Chronic Kidney Disease, Allergic Rhinitis, and Bilateral leg edema and Neuropathy      Long-Range Goal: Patient-Specific Goal   Start Date: 11/26/2020  Expected End Date: 11/26/2021  Recent Progress: On track  Priority: High  Note:   Current Barriers:  Unable to independently monitor therapeutic efficacy Unable to achieve control of diabetes   Pharmacist Clinical Goal(s):  Patient will achieve adherence to monitoring guidelines and medication adherence to achieve therapeutic efficacy achieve control of diabetes as evidenced by A1c  through collaboration with PharmD and provider.   Interventions: 1:1 collaboration with Jordan Post, MD regarding development and update of comprehensive plan of care as evidenced by provider attestation and co-signature Inter-disciplinary care team collaboration (see longitudinal plan of care) Comprehensive medication review performed; medication list updated in electronic medical record  Hypertension (BP goal <140/90) -Controlled -Current treatment: Losartan 170m, 1 tablet once daily (in AM) -Medications previously tried: none  -Current home readings: 120/70 (checks once a day or every other) - BP wrist cuff -Current dietary habits: patient has been trying to read package labels for sodium content; doesn't cook with salt at all -Current exercise habits: did not discuss -Denies hypotensive/hypertensive symptoms -Educated on Importance of home blood pressure monitoring; Proper BP monitoring technique; Symptoms of hypotension and importance of  maintaining  adequate hydration; -Counseled to monitor BP at home at least weekly, document, and provide log at future appointments -Counseled on diet and exercise extensively Recommended to continue current medication  Hyperlipidemia: (LDL goal < 70) -Uncontrolled -Current treatment: Simvastatin 97m, 1 tablet once daily (moderate intensity) Omega-3 12024m 1 capsule twice daily -Medications previously tried: none  -Current dietary patterns: did not discuss -Current exercise habits: did not discuss -Educated on Cholesterol goals;  Benefits of statin for ASCVD risk reduction; Importance of limiting foods high in cholesterol; Exercise goal of 150 minutes per week; -Counseled on diet and exercise extensively Recommended to continue current medication  Diabetes (A1c goal <7%) -Uncontrolled -Current medications: dulaglutide (Trulicity) inject 1.5 mg once a week  -Medications previously tried: metformin (kidney function), Victoza (cost)  -Current home glucose readings fasting glucose: 110-120s (checking a couple times a day) [name] prandial glucose: lower than first thing in the morning (checking around dinner time sometimes) -Denies hypoglycemic/hyperglycemic symptoms -Current meal patterns:  breakfast: did not discuss  lunch: same as dinner  dinner: one serving of vegetables, meat (chicken with some pasta or potato) snacks: did not discuss drinks: water or tea (unsweet or splenda or sweet n low) -Current exercise: did not discuss -Educated on A1c and blood sugar goals; Prevention and management of hypoglycemic episodes; Benefits of routine self-monitoring of blood sugar; Carbohydrate counting and/or plate method -Counseled to check feet daily and get yearly eye exams -Counseled on diet and exercise extensively Recommended to continue current medication Recommended trying vegetable pastas (Banza, red lentil pasta) to cut down on carbs  Edema (Goal: minimize  swelling) -Controlled -Current treatment  Furosemide 8084m1 tablet daily Potassium citrate 59m42m1 tablet three times daily with meals  -Medications previously tried: none  -Counseled on weighing herself more consistently and to contact the office if she notices > 3 lbs change in 1 day or > 5 lbs in one week; patient tries to be consistent with water intake and 4-5 glasses of water a day; patient also drinks a cup of tea in the morning and evening   Neuropathy (Goal: minimize pain) -Controlled -Current treatment  Gabapentin 100mg40mcapsules at night as needed for neuropathic pain -Medications previously tried: none  -Recommended to continue current medication  Osteoporosis / Osteopenia (Goal prevent fractures) -Not ideally controlled -Last DEXA Scan: could not locate  T-Score femoral neck: n/a  T-Score total hip: n/a  T-Score lumbar spine: n/a  T-Score forearm radius: n/a  10-year probability of major osteoporotic fracture: n/a  10-year probability of hip fracture: n/a -Patient is not a candidate for pharmacologic treatment -Current treatment  Cholecalciferol (vitamin D) 2000 units, 1 capsule on Monday through Thursday  Multivitamin 200 mg calcium (1 calcium) -Medications previously tried: none  -Recommend [id number] units of vitamin D daily. Recommend 1200 mg of calcium daily from dietary and supplemental sources. Recommend weight-bearing and muscle strengthening exercises for building and maintaining bone density. -Counseled on diet and exercise extensively Recommended repeat DEXA  Allergic rhinitis (Goal: minimize symptoms) -Controlled -Current treatment  Cetirizine 59mg,106mablet once daily as needed for allergies Sodium chloride 0.65% nasal spray, instill 1 to 2 sprays into nose as needed for allergies -Medications previously tried: none  -Recommended to continue current medication   Health Maintenance -Vaccine gaps: shingrix, COVID booster -Current therapy:   Acetaminophen 500mg, 45mblet every six hours as needed for mild pain Miralax, 17 g once daily as needed Senna 8.6mg, 1 36mlet once daily Multivitamin, 1 capsule once daily -Educated on  Cost vs benefit of each product must be carefully weighed by individual consumer -Patient is satisfied with current therapy and denies issues -Recommended to continue current medication  Patient Goals/Self-Care Activities Patient will:  - take medications as prescribed check blood pressure weekly, document, and provide at future appointments weigh daily, and contact provider if weight gain of > 3 lbs in one day target a minimum of 150 minutes of moderate intensity exercise weekly  Follow Up Plan: Telephone follow up appointment with care management team member scheduled for: 6 months       Medication Assistance: None required.  Patient affirms current coverage meets needs.  Compliance/Adherence/Medication fill history: Care Gaps: Shingrix, COVID booster, influenza  Last A1C 7.5% (07/30/20) Last BP readings was 138/78 on 07/30/2020  Star-Rating Drugs: Dulaglutide (TRULICITY) 1.5 IW/8.0H - last filled 11/30/2020 28DS at [facility].  Also checked with [facility] per Liliam she has a script on file from July, never picked up losartan (COZAAR) 100 MG - last filled 04/03/2021 68DS at [facility] simvastatin (ZOCOR) 40 MG - last filled [facility]/02/2021 90DS at [facility]  Patient's preferred pharmacy is:  [facility], [address] AT [address] [place] [address] Phone: [phone] Fax: [phone]  Uses pill box? Yes Pt endorses 100% compliance  -was using mail order - Medicare extra help  We discussed: Current pharmacy is preferred with insurance plan and patient is satisfied with pharmacy services Patient decided to: Continue current medication management strategy  Care Plan and Follow Up Patient Decision:  Patient  agrees to Care Plan and Follow-up.  Plan: Telephone follow up appointment with care management team member scheduled for:  6 months  Jeni Salles, PharmD, [place] Pharmacist [facility] [place] [phone]

## 2021-06-04 ENCOUNTER — Ambulatory Visit: Payer: Medicare HMO | Admitting: Family Medicine

## 2021-06-07 ENCOUNTER — Ambulatory Visit: Payer: Medicare HMO | Admitting: Family Medicine

## 2021-06-20 ENCOUNTER — Telehealth: Payer: Self-pay

## 2021-06-20 NOTE — Telephone Encounter (Signed)
Pt is calling and will find out if [facility] and [facility] are the same pharm. Pt is aware her medications will be sent to pharmacy on file

## 2021-06-20 NOTE — Telephone Encounter (Signed)
Received a fax from [facility] that patient is receiving medications through this pharmacy & requesting the named medication be signed off on. Medication not noted in patient list. Called pt to get clarity: pt states she has never heard of [facility] & has requested that her medications be sent to pharmacy on file (verbally confirmed by pt).

## 2021-07-31 ENCOUNTER — Telehealth: Payer: Self-pay | Admitting: Pharmacist

## 2021-07-31 NOTE — Chronic Care Management (AMB) (Signed)
Chronic Care Management Pharmacy Assistant   Name: Jordan [name]  MRN: [id number] DOB: 1946/03/07  Reason for Encounter: Disease State / Diabetes and Hypertension Assessment Call   Conditions to be addressed/monitored: DMII  Recent office visits:  None  Recent consult visits:  None  Hospital visits:  None  Medications: Outpatient Encounter Medications as of 07/31/2021  Medication Sig   Accu-Chek FastClix Lancets MISC Use to check blood sugar once daily. Dx Code E11.65   acetaminophen (TYLENOL) 500 MG tablet Take 500 mg by mouth every 6 (six) hours as needed for mild pain. Pain    Alcohol Swabs PADS Use as directed   aspirin 81 MG tablet Take 81 mg by mouth daily.   Blood Glucose Calibration (TRUE METRIX LEVEL 1) Low SOLN Use as directed   Blood Glucose Monitoring Suppl (ACCU-CHEK NANO SMARTVIEW) w/Device KIT Use to check blood glucose 2 times daily.   Blood Glucose Monitoring Suppl (TRUE METRIX METER) DEVI Use as directed   cetirizine (ZYRTEC) 10 MG tablet Take 1 tablet (10 mg total) by mouth daily as needed. allergies   Cholecalciferol (VITAMIN D) 2000 UNITS CAPS Take 2,000 Units by mouth. Takes only on days Monday through Thursday   Dulaglutide (TRULICITY) 1.5 QI/3.4VQ SOPN Inject 1.5 mg into the skin once a week.   furosemide (LASIX) 80 MG tablet TAKE 1 TABLET(80 MG) BY MOUTH TWICE DAILY   gabapentin (NEURONTIN) 100 MG capsule TAKE 1 TO 2 CAPSULES BY MOUTH AT NIGHT AS NEEDED FOR NEUROPATHIC PAIN   glucose blood (ACCU-CHEK SMARTVIEW) test strip USE TO CHECK BLOOD GLUCOSE TWICE DAILY AS DIRECTED   glucose blood (TRUE METRIX BLOOD GLUCOSE TEST) test strip Use as instructed twice a day   losartan (COZAAR) 100 MG tablet TAKE 1 TABLET(100 MG) BY MOUTH DAILY   Multiple Vitamin (MULTIVITAMIN) capsule Take 1 capsule by mouth daily.   Omega-3 Fatty Acids (FISH OIL) 1200 MG CAPS Take 1,200 mg by mouth 2 (two) times daily.   polyethylene glycol powder (GLYCOLAX/MIRALAX) powder Take  17 g by mouth daily as needed. One capfull daily by mouth with liquid   senna (SENOKOT) 8.6 MG tablet Take 1 tablet by mouth daily as needed.    simvastatin (ZOCOR) 40 MG tablet TAKE 1 TABLET(40 MG) BY MOUTH DAILY   sodium chloride (OCEAN) 0.65 % nasal spray Place 1-2 sprays into the nose as needed. Allergies    TRUEplus Lancets 28G MISC Use as directed   No facility-administered encounter medications on file as of 07/31/2021.  Fill History: Trulicity 1.5 QV/9.5 mL subcutaneous pen injector 05/26/2021 28   furosemide (LASIX) tablet 05/19/2021 90   gabapentin 100 mg capsule 05/26/2021 30   losartan 100 mg tablet 05/26/2021 30   simvastatin 40 mg tablet 06/11/2021 20  Reviewed chart prior to disease state call. Spoke with patient regarding BP  Recent Office Vitals: BP Readings from Last 3 Encounters:  07/30/20 138/78  10/31/19 118/66  08/01/19 112/72   Pulse Readings from Last 3 Encounters:  07/30/20 73  10/31/19 99  08/01/19 78    Wt Readings from Last 3 Encounters:  07/30/20 198 lb (89.8 kg)  04/25/19 231 lb (104.8 kg)  03/24/19 215 lb (97.5 kg)     Kidney Function Lab Results  Component Value Date/Time   CREATININE 1.80 (H) 07/30/2020 09:45 AM   CREATININE 1.41 (H) 10/31/2019 11:08 AM   GFR 27.09 (L) 07/30/2020 09:45 AM   GFRNONAA 37 (L) 01/08/2016 09:38 PM   GFRAA 43 (L) 01/08/2016  09:38 PM    BMP Latest Ref Rng & Units 07/30/2020 10/31/2019 01/11/2019  Glucose 70 - 99 mg/dL 154(H) 141(H) 141(H)  BUN 6 - 23 mg/dL 42(H) 42(H) 49(H)  Creatinine 0.40 - 1.20 mg/dL 1.80(H) 1.41(H) 1.53(H)  Sodium 135 - 145 mEq/L 141 136 138  Potassium 3.5 - 5.1 mEq/L 3.8 4.5 4.0  Chloride 96 - 112 mEq/L 107 109 108  CO2 19 - 32 mEq/L 21 16(L) 18(L)  Calcium 8.4 - 10.5 mg/dL 9.6 9.1 9.4     Recent Relevant Labs: Lab Results  Component Value Date/Time   HGBA1C 7.5 (H) 07/30/2020 09:45 AM   HGBA1C 7.7 (A) 07/30/2020 09:16 AM   HGBA1C 6.5 (A) 10/31/2019 10:50 AM   HGBA1C 6.8 (H)  12/22/2018 09:11 AM   MICROALBUR 84.1 (H) 12/31/2010 09:03 AM    Kidney Function Lab Results  Component Value Date/Time   CREATININE 1.80 (H) 07/30/2020 09:45 AM   CREATININE 1.41 (H) 10/31/2019 11:08 AM   GFR 27.09 (L) 07/30/2020 09:45 AM   GFRNONAA 37 (L) 01/08/2016 09:38 PM   GFRAA 43 (L) 01/08/2016 09:38 PM   Current antihypertensive regimen:  Losartan 100 mg take 1 tablet daily  How often are you checking your Blood Pressure? Patient is checking daily  Current home BP readings: patient states her readings are always around 110/60  What recent interventions/DTPs have been made by any provider to improve Blood Pressure control since last CPP Visit: No recent interventions   Any recent hospitalizations or ED visits since last visit with CPP? No recent hospital visits.   What diet changes have been made to improve Blood Pressure Control?  Patient is not following any specific diet Breakfast - patient will have toast and eggs or cereal Lunch - patient will have a salad or 1/2 sandwich Dinner - patient will have a meal with a meat, vegetable and a starch (meats are chicken, fish or a rare red meat)  What exercise is being done to improve your Blood Pressure Control?  Patient is in a wheelchair, she does do sitting exercises daily such as leg and arm lifts.   Current antihyperglycemic regimen:  Trulicity 1.5 PT/4.6 ml - Inject 1.5 mg into the skin once a week  What recent interventions/DTPs have been made to improve glycemic control:  No recent interventions   Patient denies hypoglycemic symptoms  Patient denies hyperglycemic symptoms  How often are you checking your blood sugar? Patient is checking blood sugars once daily  What are your blood sugars ranging?  Fasting: patients blood sugars are running between 110-140  During the week, how often does your blood glucose drop below 70? Patient denies any readings below 70  Are you checking your feet daily/regularly?  Patients husband is checking daily  Adherence Review: Is the patient currently on a STATIN medication? Yes Is the patient currently on ACE/ARB medication? Yes Does the patient have >5 day gap between last estimated fill dates? Yes   Care Gaps: AWV - scheduled 04/15/2022 Last BP - 138/78 on 07/30/2020 Last A1C - 7.5 on 07/30/2020 Shingrix - never done Covid booster - overdue Flu vaccine - overdue Foot exam - overdue Eye exam - overdue  Star Rating Drugs: Dulaglutide 1.5 MG/0.49M - last filled 06/20/2021 28DS at [facility]  Losartan 100 MG - last filled 06/20/2021 90 DS at [facility] Simvastatin 40 MG - last filled 06/21/2021 30 DS at [facility]       Verified with Ronnie at [employer]  [place]  Clinical Pharmacist Assistant [phone]

## 2021-08-17 DIAGNOSIS — Z936 Other artificial openings of urinary tract status: Secondary | ICD-10-CM | POA: Diagnosis not present

## 2021-08-22 ENCOUNTER — Telehealth: Payer: Self-pay | Admitting: Pharmacist

## 2021-08-22 NOTE — Chronic Care Management (AMB) (Signed)
? ? ?  Chronic Care Management ?Pharmacy Assistant  ? ?Name: Liliauna Santoni  MRN: [id number] DOB: 06-20-1945 ? ?Reason for Encounter: Reschedule with patient to August.  ?  ?[name] CMA  ?Clinical Pharmacist Assistant ?[phone] ? ?

## 2021-09-23 ENCOUNTER — Other Ambulatory Visit: Payer: Self-pay | Admitting: Family Medicine

## 2021-09-23 DIAGNOSIS — Z936 Other artificial openings of urinary tract status: Secondary | ICD-10-CM | POA: Diagnosis not present

## 2021-09-23 DIAGNOSIS — G834 Cauda equina syndrome: Secondary | ICD-10-CM | POA: Diagnosis not present

## 2021-09-23 DIAGNOSIS — I10 Essential (primary) hypertension: Secondary | ICD-10-CM

## 2021-09-23 DIAGNOSIS — G809 Cerebral palsy, unspecified: Secondary | ICD-10-CM | POA: Diagnosis not present

## 2021-09-25 DIAGNOSIS — Z936 Other artificial openings of urinary tract status: Secondary | ICD-10-CM | POA: Diagnosis not present

## 2021-10-22 ENCOUNTER — Other Ambulatory Visit: Payer: Self-pay | Admitting: Family Medicine

## 2021-10-22 DIAGNOSIS — I10 Essential (primary) hypertension: Secondary | ICD-10-CM

## 2021-10-25 ENCOUNTER — Other Ambulatory Visit: Payer: Self-pay

## 2021-10-25 DIAGNOSIS — Z936 Other artificial openings of urinary tract status: Secondary | ICD-10-CM | POA: Diagnosis not present

## 2021-10-25 DIAGNOSIS — G834 Cauda equina syndrome: Secondary | ICD-10-CM | POA: Diagnosis not present

## 2021-10-25 DIAGNOSIS — G809 Cerebral palsy, unspecified: Secondary | ICD-10-CM | POA: Diagnosis not present

## 2021-10-25 MED ORDER — GABAPENTIN 100 MG PO CAPS: ORAL_CAPSULE | ORAL | 0 refills | Status: DC

## 2021-11-06 ENCOUNTER — Ambulatory Visit (INDEPENDENT_AMBULATORY_CARE_PROVIDER_SITE_OTHER): Payer: Medicare HMO | Admitting: Family Medicine

## 2021-11-06 ENCOUNTER — Encounter: Payer: Self-pay | Admitting: Family Medicine

## 2021-11-06 VITALS — BP 136/66 | HR 70 | Temp 98.4°F | Ht 67.0 in | Wt 198.0 lb

## 2021-11-06 DIAGNOSIS — L84 Corns and callosities: Secondary | ICD-10-CM

## 2021-11-06 DIAGNOSIS — E785 Hyperlipidemia, unspecified: Secondary | ICD-10-CM

## 2021-11-06 DIAGNOSIS — N1832 Chronic kidney disease, stage 3b: Secondary | ICD-10-CM

## 2021-11-06 DIAGNOSIS — E1122 Type 2 diabetes mellitus with diabetic chronic kidney disease: Secondary | ICD-10-CM

## 2021-11-06 DIAGNOSIS — I10 Essential (primary) hypertension: Secondary | ICD-10-CM

## 2021-11-06 LAB — BASIC METABOLIC PANEL
BUN: 57 mg/dL — ABNORMAL HIGH (ref 6–23)
CO2: 25 mEq/L (ref 19–32)
Calcium: 9.6 mg/dL (ref 8.4–10.5)
Chloride: 101 mEq/L (ref 96–112)
Creatinine, Ser: 2.02 mg/dL — ABNORMAL HIGH (ref 0.40–1.20)
GFR: 23.38 mL/min — ABNORMAL LOW (ref >60.00)
Glucose, Bld: 225 mg/dL — ABNORMAL HIGH (ref 70–99)
Potassium: 3.8 mEq/L (ref 3.5–5.1)
Sodium: 137 mEq/L (ref 135–145)

## 2021-11-06 LAB — LIPID PANEL
Cholesterol: 158 mg/dL (ref 0–200)
HDL: 30.2 mg/dL — ABNORMAL LOW (ref >39.00)
NonHDL: 127.64
Total CHOL/HDL Ratio: 5
Triglycerides: 222 mg/dL — ABNORMAL HIGH (ref 0.0–149.0)
VLDL: 44.4 mg/dL — ABNORMAL HIGH (ref 0.0–40.0)

## 2021-11-06 LAB — HEMOGLOBIN A1C: Hgb A1c MFr Bld: 9.5 % — ABNORMAL HIGH (ref 4.6–6.5)

## 2021-11-06 LAB — CBC WITH DIFFERENTIAL/PLATELET
Basophils Absolute: 0 10*3/uL (ref 0.0–0.1)
Basophils Relative: 0.5 % (ref 0.0–3.0)
Eosinophils Absolute: 0.2 10*3/uL (ref 0.0–0.7)
Eosinophils Relative: 2.3 % (ref 0.0–5.0)
HCT: 34.1 % — ABNORMAL LOW (ref 36.0–46.0)
Hemoglobin: 10.9 g/dL — ABNORMAL LOW (ref 12.0–15.0)
Lymphocytes Relative: 31.4 % (ref 12.0–46.0)
Lymphs Abs: 2.2 10*3/uL (ref 0.7–4.0)
MCHC: 32 g/dL (ref 30.0–36.0)
MCV: 87.6 fl (ref 78.0–100.0)
Monocytes Absolute: 0.5 10*3/uL (ref 0.1–1.0)
Monocytes Relative: 7.4 % (ref 3.0–12.0)
Neutro Abs: 4 10*3/uL (ref 1.4–7.7)
Neutrophils Relative %: 58.4 % (ref 43.0–77.0)
Platelets: 373 10*3/uL (ref 150.0–400.0)
RBC: 3.89 Mil/uL (ref 3.87–5.11)
RDW: 14.9 % (ref 11.5–15.5)
WBC: 6.9 10*3/uL (ref 4.0–10.5)

## 2021-11-06 LAB — LDL CHOLESTEROL, DIRECT: Direct LDL: 105 mg/dL

## 2021-11-06 LAB — HEPATIC FUNCTION PANEL
ALT: 15 U/L (ref 0–35)
AST: 10 U/L (ref 0–37)
Albumin: 3.9 g/dL (ref 3.5–5.2)
Alkaline Phosphatase: 101 U/L (ref 39–117)
Bilirubin, Direct: 0.1 mg/dL (ref 0.0–0.3)
Total Bilirubin: 0.5 mg/dL (ref 0.2–1.2)
Total Protein: 7.4 g/dL (ref 6.0–8.3)

## 2021-11-06 NOTE — Progress Notes (Signed)
Established Patient Office Visit  Subjective   Patient ID: Jordan [name], female    DOB: January 28, 1946  Age: 76 y.o. MRN: [id number]  Chief Complaint  Patient presents with   Foot Injury    Patient complains of spot on heel of left foot, x1 day    HPI   Jordan [name] has not been seen here in over a year.  She has history of obesity, venous stasis lower extremities, hypertension, type 2 diabetes with chronic kidney disease, cerebral palsy, hyperlipidemia.  She has had history of kidney stones and history of total cystectomy.  She noticed darkened spot on her left medial heel about 2 days ago.  Minimal soreness.  No drainage.  No recent change of shoewear.  No recent A1c.  Last A1c was mid 7 range.  We reviewed her medications which include losartan, furosemide, simvastatin, Trulicity and she states she is compliant with all.  She is not checking her blood sugars regularly.  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   PT CAN TRANSFER BED TO CHAIR--CAN STAND BRIEFLY--BUT NOT ABLE TO WALK; RT LEG HYPEREXTENDS BACKWARD AND IS WEAK-HAS A POWER CHAIR   COLONIC POLYPS 07/11/2010   Constipation    Diabetes mellitus    ORAL MED FOR DIABETES   Gallbladder problem    GERD (gastroesophageal reflux disease)    PAST HISTORY GERD--WATCHES DIET - NOT ON ANY MEDS FOR GERD   HYPERLIPIDEMIA 11/29/2009   HYPERTENSION 11/29/2009   Incisional hernia    NO PAIN-NOT CAUSING ANY DISCOMFORT   Kidney infection    Osteoarthritis    Osteomyelitis (HCC)    PONV (postoperative nausea and vomiting)    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[place])    11-28-14 remains   Shortness of breath    Stroke (cerebrum) ([place])    Past Surgical History:  Procedure Laterality Date   1992     ABDOMINAL HYSTERECTOMY  1990   partial   [place]     COLONOSCOPY WITH PROPOFOL N/A 12/07/2014   Procedure: COLONOSCOPY WITH PROPOFOL;  Surgeon: Gatha Mayer, MD;  Location: [facility];  Service: Endoscopy;  Laterality: N/A;   LEFT CARPAL TUNNEL RELEASE X 2     LUMBAR SYMPATHETECTOMIES X 2     MULTIPLE RIGHT ANKLE SURGERIES     NEPHROLITHOTOMY  10/01/2011   Procedure: NEPHROLITHOTOMY PERCUTANEOUS;  Surgeon: Franchot Gallo, MD;  Location: [facility];  Service: Urology;  Laterality: Right;        PARTIAL AMPUTATION OF LEFT GREAT TOE     1985   radical cystectomy  1992   neurogenic bladder sec CP   REPAIR OF NASAL FRACTURE  1985   REVISION UROSTOMY CUTANEOUS  1992   RIGHT CARPAL TUNNEL RELEASE     RIGHT HIP FLEXION CONTRACTURE REPAIR  1986   RT [place]   SEVERAL RT ANKLE SURGERIES PRIOR TO THE FUSION   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 She has never used smokeless tobacco. She reports that she does not drink alcohol and does not use drugs. family history includes Alcoholism in her father and mother; Bladder Cancer in her father; Cancer in her father; Colon cancer (age of onset: 45) in her father; Colon polyps in her sister; Diabetes in her father; Emphysema in her father; Heart attack in her maternal grandfather; Heart disease in her father and maternal grandmother; Heart disease (age of onset: 4) in her mother; Hyperlipidemia in her father and mother; Hypertension in her mother; Liver disease in her father and mother; Sleep apnea in her father. Allergies  Allergen Reactions   Morphine Shortness Of Breath   Onglyza [Saxagliptin] Other (See Comments)    Headache within 45 minutes of taking this.   Baclofen     GI upset   Lisinopril Cough   Metoclopramide Hcl     Reglan, irritable   Penicillins      " MAJOR CASE OF HIVES"    Review of Systems  Constitutional:  Negative for chills and fever.  Respiratory:  Negative for cough and shortness of breath.   Cardiovascular:  Negative for chest pain.  Neurological:  Negative for dizziness.     Objective:     BP 136/66 (BP Location: Left Arm, Patient Position: Sitting, Cuff Size: Normal)   Pulse 70   Temp 98.4 F (36.9 C) (Oral)   Ht '5\' 7"'$  (1.702 m)   Wt 198 lb (89.8 kg)   SpO2 99%   BMI 31.01 kg/m    Physical Exam Vitals reviewed.  Cardiovascular:     Rate and Rhythm: Normal rate and regular rhythm.  Pulmonary:     Effort: Pulmonary effort is normal.     Breath sounds: Normal breath sounds.  Musculoskeletal:     Comments: She has compression stockings on bilaterally.  No significant leg edema.  Skin:    Comments: Left foot reveals approximately 1-1/2 cm callused area medial heel with what appears to be some dried blood underneath the skin.  This is not open.  No drainage.  No surrounding erythema.  No warmth.  Normal sensory function to touch.  Neurological:     Mental Status: She is alert.     No results found for any visits on 11/06/21.  Last CBC Lab Results  Component Value Date   WBC 9.3 01/26/2019   HGB 11.2 01/26/2019   HCT 36.2 01/26/2019   MCV 87 01/26/2019   MCH 27.1 01/26/2019   RDW 14.5 01/26/2019   PLT 379.0 25/95/6387   Last metabolic panel Lab Results  Component Value Date   GLUCOSE 154 (H) 07/30/2020   NA 141 07/30/2020   K 3.8 07/30/2020   CL 107 07/30/2020   CO2 21 07/30/2020   BUN 42 (H) 07/30/2020   CREATININE 1.80 (H) 07/30/2020   GFRNONAA 37 (L) 01/08/2016   CALCIUM 9.6 07/30/2020   PHOS 3.6 07/09/2014   PROT 7.1 07/30/2020   ALBUMIN 4.0 07/30/2020   BILITOT 0.5 07/30/2020   ALKPHOS 109 07/30/2020   AST 10 07/30/2020   ALT 13 07/30/2020   ANIONGAP 11 01/08/2016   Last lipids Lab Results  Component Value Date   CHOL 142 07/30/2020   HDL 34.20 (L) 07/30/2020   LDLCALC 60  01/26/2019   LDLDIRECT 89.0 07/30/2020   TRIG 211.0 (H) 07/30/2020   CHOLHDL 4 07/30/2020   Last hemoglobin A1c Lab Results  Component Value Date   HGBA1C 7.5 (H) 07/30/2020  The 10-year ASCVD risk score (Arnett DK, et al., 2019) is: 46%    Assessment & Plan:   Problem List Items Addressed This Visit       Unprioritized   Type 2 diabetes mellitus with diabetic chronic kidney disease ([place])   Relevant Orders   Basic metabolic panel   CBC with Differential/Platelet   Hemoglobin A1c   Essential hypertension - Primary   Hyperlipidemia   Relevant Orders   Lipid panel   Hepatic function panel   Other Visit Diagnoses     Callus of foot       Relevant Orders   Ambulatory referral to Podiatry     -She has callused area medial left heel which is not open.  High risk for developing diabetic foot ulcer.  Set up podiatry referral.  Keep pressure off this area is much as possible.  Watch closely for any drainage or other changes  -Obtain labs as above  -We have asked that she confirm her current home medications with her med list.  -Set up 19-monthfollow-up  -May need to adjust her Trulicity dose if AF4Enot to goal  -Handout given on diabetic foot care  Return in about 3 months (around 02/06/2022).    BCarolann Littler MD

## 2021-11-07 MED ORDER — TRULICITY 3 MG/0.5ML ~~LOC~~ SOAJ: 3.0000 mg | SUBCUTANEOUS | 1 refills | Status: DC

## 2021-11-07 NOTE — Addendum Note (Signed)
Addended by: Agnes Lawrence on: 11/07/2021 03:07 PM   Modules accepted: Orders

## 2021-11-26 ENCOUNTER — Telehealth: Payer: Medicare HMO

## 2021-12-02 ENCOUNTER — Other Ambulatory Visit: Payer: Self-pay | Admitting: Family Medicine

## 2021-12-13 ENCOUNTER — Other Ambulatory Visit: Payer: Self-pay

## 2021-12-13 MED ORDER — FUROSEMIDE 80 MG PO TABS: ORAL_TABLET | ORAL | 0 refills | Status: DC

## 2021-12-29 ENCOUNTER — Other Ambulatory Visit: Payer: Self-pay | Admitting: Family Medicine

## 2021-12-29 DIAGNOSIS — E781 Pure hyperglyceridemia: Secondary | ICD-10-CM

## 2022-01-20 ENCOUNTER — Other Ambulatory Visit: Payer: Self-pay | Admitting: Family Medicine

## 2022-01-22 ENCOUNTER — Encounter (INDEPENDENT_AMBULATORY_CARE_PROVIDER_SITE_OTHER): Payer: Self-pay

## 2022-02-04 ENCOUNTER — Telehealth: Payer: Self-pay | Admitting: Pharmacist

## 2022-02-04 NOTE — Chronic Care Management (AMB) (Signed)
    Chronic Care Management Pharmacy Assistant   Name: [name]  MRN: [id number] DOB: 11/22/1945  02/05/2022 APPOINTMENT REMINDER  Called Jordan [name], No answer, left message of appointment on 02/05/2022 at 10:30 via telephone visit with Jordan [name], Pharm D. Notified to have all medications, supplements, blood pressure and/or blood sugar logs available during appointment and to return call if need to reschedule.  Care Gaps: AWV - scheduled 04/15/2022 Last BP - 136/66 on 11/06/2021 Last A1C - 9.5 on 11/06/2021 Shingrix - never done Covid booster - overdue Eye exam - overdue Flu - due  Star Rating Drug: Simvastatin 40 mg - last filled 12/30/2021 90 DS at [facility] Trulicity '3mg'$ /0.5 ml - last filled 01/21/2022 28 DS at [facility]  Any gaps in medications fill history? No  [name] [facility]  Catering manager [phone]

## 2022-02-04 NOTE — Progress Notes (Unsigned)
Chronic Care Management Pharmacy Note  02/05/2022 Name:  Meshell Abdulaziz MRN:  [id number] DOB:  [birth year]  Summary: A1c not at goal < 7% LDL not at goal < 70  Recommendations/Changes made from today's visit: -Recommended bringing BP cuff to PCP visit next week -Recommended scheduling foot exam with podiatry and provided telephone number -Recommend fructosamine to assess blood sugar control based on low hemoglobin -Recommend switching to high intensity rosuvastatin 20 mg for LDL lowering  Plan: DM and BP assessment in 3 months Plan to follow up in 6 months  Subjective: Abbygail Willhoite is an 76 y.o. year old female who is a primary patient of Burchette, Alinda Sierras, MD.  The CCM team was consulted for assistance with disease management and care coordination needs.    Engaged with patient by telephone for follow up visit in response to provider referral for pharmacy case management and/or care coordination services.   Consent to Services:  The patient was given information about Chronic Care Management services, agreed to services, and gave verbal consent prior to initiation of services.  Please see initial visit note for detailed documentation.   Patient Care Team: [name], MD as PCP - General (Family Medicine) Franchot Gallo, MD as Consulting Physician (Urology) Jari Pigg, MD as Consulting Physician (Dermatology) Constance Haw, MD as Consulting Physician (Cardiology) Viona Gilmore, [facility] as Pharmacist (Pharmacist)  Recent office visits: 11/06/21 Carolann Littler, MD: Patient presented for callus of foot and DM follow up. Increased Trulicity to 3 mg per week. LDL increased.   Recent consult visits: 01/01/21 Marylynn Pearson: Patient presented for initial visit. Unable to access notes.  07/29/2019- Urology- Franchot Gallo- Patient presented for office visit for calculus of kidney and history of UTI. Unable to access notes.  Hospital visits: None in previous  6 months   Objective:  Lab Results  Component Value Date   CREATININE 2.02 (H) 11/06/2021   BUN 57 (H) 11/06/2021   GFR 23.38 (L) 11/06/2021   GFRNONAA 37 (L) 01/08/2016   GFRAA 43 (L) 01/08/2016   NA 137 11/06/2021   K 3.8 11/06/2021   CALCIUM 9.6 11/06/2021   CO2 25 11/06/2021   GLUCOSE 225 (H) 11/06/2021    Lab Results  Component Value Date/Time   HGBA1C 9.5 (H) 11/06/2021 07:28 AM   HGBA1C 7.5 (H) 07/30/2020 09:45 AM   HGBA1C 7.7 (A) 07/30/2020 09:16 AM   GFR 23.38 (L) 11/06/2021 07:28 AM   GFR 27.09 (L) 07/30/2020 09:45 AM   MICROALBUR 84.1 (H) 12/31/2010 09:03 AM    Last diabetic Eye exam:  Lab Results  Component Value Date/Time   HMDIABEYEEXA No Retinopathy 07/30/2020 12:00 AM    Last diabetic Foot exam:  Lab Results  Component Value Date/Time   HMDIABFOOTEX normal 12/20/2014 12:00 AM     Lab Results  Component Value Date   CHOL 158 11/06/2021   HDL 30.20 (L) 11/06/2021   LDLCALC 60 01/26/2019   LDLDIRECT 105.0 11/06/2021   TRIG 222.0 (H) 11/06/2021   CHOLHDL 5 11/06/2021       Latest Ref Rng & Units 11/06/2021    7:28 AM 07/30/2020    9:45 AM 12/22/2018    9:11 AM  Hepatic Function  Total Protein 6.0 - 8.3 g/dL 7.4  7.1  7.0   Albumin 3.5 - 5.2 g/dL 3.9  4.0  4.0   AST 0 - 37 U/L '10  10  8   ' ALT 0 - 35 U/L 15  13  13  Alk Phosphatase 39 - 117 U/L 101  109  109   Total Bilirubin 0.2 - 1.2 mg/dL 0.5  0.5  0.5   Bilirubin, Direct 0.0 - 0.3 mg/dL 0.1  0.1  0.1     Lab Results  Component Value Date/Time   TSH 1.130 01/26/2019 03:45 PM   TSH 1.27 01/04/2016 11:20 AM   FREET4 1.39 01/26/2019 03:45 PM       Latest Ref Rng & Units 11/06/2021    7:28 AM 01/26/2019    3:45 PM 01/15/2016   10:08 AM  CBC  WBC 4.0 - 10.5 K/uL 6.9  9.3  6.6   Hemoglobin 12.0 - 15.0 g/dL 10.9  11.2  11.2   Hematocrit 36.0 - 46.0 % 34.1  36.2  34.0   Platelets 150.0 - 400.0 K/uL 373.0   379.0     No results found for: "VD25OH"  Clinical ASCVD: No  The 10-year  ASCVD risk score (Arnett DK, et al., 2019) is: 45.6%   Values used to calculate the score:     Age: 76 years     Sex: Female     Is Non-Hispanic African American: No     Diabetic: Yes     Tobacco smoker: No     Systolic Blood Pressure: 409 mmHg     Is BP treated: Yes     HDL Cholesterol: 30.2 mg/dL     Total Cholesterol: 158 mg/dL       04/03/2021    9:41 AM 02/28/2020    9:57 AM 01/26/2019   11:00 AM  Depression screen PHQ 2/9  Decreased Interest 0 0 1  Down, Depressed, Hopeless 0 0 0  PHQ - 2 Score 0 0 1  Altered sleeping  0 0  Tired, decreased energy  0 1  Change in appetite  0 1  Feeling bad or failure about yourself   0 0  Trouble concentrating  0 0  Moving slowly or fidgety/restless  0 0  Suicidal thoughts  0 0  PHQ-9 Score  0 3  Difficult doing work/chores  Not difficult at all Not difficult at all      Social History   Tobacco Use  Smoking Status Never  Smokeless Tobacco Never   BP Readings from Last 3 Encounters:  11/06/21 136/66  07/30/20 138/78  10/31/19 118/66   Pulse Readings from Last 3 Encounters:  11/06/21 70  07/30/20 73  10/31/19 99   Wt Readings from Last 3 Encounters:  11/06/21 198 lb (89.8 kg)  07/30/20 198 lb (89.8 kg)  04/25/19 231 lb (104.8 kg)   BMI Readings from Last 3 Encounters:  11/06/21 31.01 kg/m  07/30/20 31.01 kg/m  04/25/19 36.18 kg/m    Assessment/Interventions: Review of patient past medical history, allergies, medications, health status, including review of consultants reports, laboratory and other test data, was performed as part of comprehensive evaluation and provision of chronic care management services.   SDOH:  (Social Determinants of Health) assessments and interventions performed: No  SDOH Screenings   Alcohol Screen: Low Risk  (02/28/2020)   Alcohol Screen    Last Alcohol Screening Score (AUDIT): 0  Depression (PHQ2-9): Low Risk  (04/03/2021)   Depression (PHQ2-9)    PHQ-2 Score: 0  Financial  Resource Strain: Low Risk  (06/06/2021)   Overall Financial Resource Strain (CARDIA)    Difficulty of Paying Living Expenses: Not very hard  Food Insecurity: No Food Insecurity (06/06/2021)   Hunger Vital Sign    Worried  About Running Out of Food in the Last Year: Never true    Ran Out of Food in the Last Year: Never true  Housing: Low Risk  (06/06/2021)   Housing    Last Housing Risk Score: 0  Physical Activity: Insufficiently Active (06/06/2021)   Exercise Vital Sign    Days of Exercise per Week: 3 days    Minutes of Exercise per Session: 20 min  Social Connections: Socially Integrated (06/06/2021)   Social Connection and Isolation Panel [NHANES]    Frequency of Communication with Friends and Family: Three times a week    Frequency of Social Gatherings with Friends and Family: Once a week    Attends Religious Services: More than 4 times per year    Active Member of Clubs or Organizations: Yes    Attends Archivist Meetings: More than 4 times per year    Marital Status: Married  Stress: No Stress Concern Present (06/06/2021)   [place]    Feeling of Stress : Not at all  Tobacco Use: Low Risk  (11/06/2021)   Patient History    Smoking Tobacco Use: Never    Smokeless Tobacco Use: Never    Passive Exposure: Not on file  Transportation Needs: No Transportation Needs (06/06/2021)   PRAPARE - Transportation    Lack of Transportation (Medical): No    Lack of Transportation (Non-Medical): No    CCM Care Plan  Allergies  Allergen Reactions   Morphine Shortness Of Breath   Onglyza [Saxagliptin] Other (See Comments)    Headache within 45 minutes of taking this.   Baclofen     GI upset   Lisinopril Cough   Metoclopramide Hcl     Reglan, irritable   Penicillins     " MAJOR CASE OF HIVES"    Medications Reviewed Today     Reviewed by [name], MD (Physician) on 11/06/21 at [phone]  Med List  Status: <None>   Medication Order Taking? Sig Documenting Provider Last Dose Status Informant  Accu-Chek FastClix Lancets MISC [id number] Yes Use to check blood sugar once daily. Dx Code E70.35 [name], MD Taking Active   acetaminophen (TYLENOL) 500 MG tablet [id number] Yes Take 500 mg by mouth every 6 (six) hours as needed for mild pain. Pain  [provider] Taking Active Self  Alcohol Swabs PADS [id number] Yes Use as directed [name], MD Taking Active   Discontinued 11/06/21 0713 Blood Glucose Calibration (TRUE METRIX LEVEL 1) Low SOLN [id number] Yes Use as directed [name], MD Taking Active   Blood Glucose Monitoring Suppl (ACCU-CHEK NANO SMARTVIEW) w/Device KIT [id number] Yes Use to check blood glucose 2 times daily. [name], MD Taking Active   Blood Glucose Monitoring Suppl ([address] Antonietta Breach) DEVI [id number] Yes Use as directed [name], MD Taking Active   cetirizine (ZYRTEC) 10 MG tablet [id number] Yes Take 1 tablet (10 mg total) by mouth daily as needed. allergies Burchette, Alinda Sierras, MD Taking Active   Cholecalciferol (VITAMIN D) 2000 UNITS CAPS [id number] Yes Take 2,000 Units by mouth. Takes only on days Monday through Thursday [provider] Taking Active Self  Dulaglutide (TRULICITY) 1.5 TO/6.7TI SOPN [id number] Yes Inject 1.5 mg into the skin once a week. [name], MD Taking Active   furosemide (LASIX) 80 MG tablet [id number] Yes TAKE 1 TABLET(80 MG) BY MOUTH TWICE DAILY Burchette, Alinda Sierras, MD Taking Active   gabapentin (NEURONTIN)  100 MG capsule [id number] Yes TAKE 1 TO 2 CAPSULES BY MOUTH AT NIGHT AS NEEDED FOR NEUROPATHIC PAIN Burchette, Alinda Sierras, MD Taking Active   glucose blood (ACCU-CHEK SMARTVIEW) test strip [id number] Yes USE TO CHECK BLOOD GLUCOSE TWICE DAILY AS DIRECTED Burchette, Alinda Sierras, MD Taking Active   glucose blood (TRUE METRIX BLOOD GLUCOSE TEST) test strip [id number]  Use as instructed twice a day  [name], MD  Active   losartan (COZAAR) 100 MG tablet [id number] Yes TAKE 1 TABLET(100 MG) BY MOUTH DAILY.   *Physical required for future refills.Elease Hashimoto, Alinda Sierras, MD Taking Active   Multiple Vitamin (MULTIVITAMIN) capsule [id number] Yes Take 1 capsule by mouth daily. [provider] Taking Active Self  Omega-3 Fatty Acids (FISH OIL) 1200 MG CAPS [id number] Yes Take 1,200 mg by mouth 2 (two) times daily. [provider] Taking Active Self  polyethylene glycol powder (GLYCOLAX/MIRALAX) powder [id number] Yes Take 17 g by mouth daily as needed. One capfull daily by mouth with liquid [provider] Taking Active Self  senna (SENOKOT) 8.6 MG tablet [id number] Yes Take 1 tablet by mouth daily as needed.  [provider] Taking Active   simvastatin (ZOCOR) 40 MG tablet [id number] Yes TAKE 1 TABLET(40 MG) BY MOUTH DAILY Burchette, Alinda Sierras, MD Taking Active   sodium chloride (OCEAN) 0.65 % nasal spray [id number] Yes Place 1-2 sprays into the nose as needed. Allergies  [provider] Taking Active Self  TRUEplus Lancets 28G [place] [id number] Yes Use as directed [name], MD Taking Active             Patient Active Problem List   Diagnosis Date Noted   Obesity (BMI 30.0-34.9) 04/26/2019   Venous stasis of both lower extremities 04/26/2019   Leg wound, right, subsequent encounter 12/01/2016   Hx of colonic polyps    Type 2 diabetes mellitus with diabetic chronic kidney disease ([place]) 08/04/2014   Pyelonephritis 07/08/2014   History of kidney stones 06/12/2014   Hypercalcemia 06/12/2014   Sepsis secondary to UTI ([place]) 12/03/2011   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   Type 2 diabetes mellitus ([place]) 10/09/2010   Infantile cerebral palsy ([place]) 07/11/2010   Hyperlipidemia 11/29/2009   Essential hypertension 11/29/2009    Immunization History  Administered Date(s) Administered   Fluad Quad(high Dose 65+) 04/26/2019, 07/30/2020    Influenza Split 05/04/2011   Influenza Whole 04/16/2010   Influenza, High Dose Seasonal PF 04/15/2013, 05/14/2015, 04/10/2017, 03/10/2018   Influenza, Seasonal, Injecte, Preservative Fre 06/01/2012   Influenza,inj,Quad PF,6+ Mos 02/09/2014   Influenza-Unspecified 02/29/2016   Moderna Sars-Covid-2 Vaccination 07/29/2019, 08/26/2019   PFIZER(Purple Top)SARS-COV-2 Vaccination 04/26/2020   PPD Test 01/07/2011, 01/07/2011, 01/07/2011, 01/07/2011, 01/07/2011   Pneumococcal Conjugate-13 06/12/2014   Pneumococcal Polysaccharide-23 06/16/2004, 01/07/2011   Td 06/16/2005   Tdap 06/03/2011, 09/27/2016   Patient is not eating sweets very often. She does have chocolate every once in a while. Patient only drinks diet soda and tea with splenda. Patient does try to be as good as she should be.  Patient has been trying to eat more vegetables.   Patient is wearing the compression stockings every day. Her right leg has always been a little more swollen. Patient uses a stoma for overnight bladder emptying.    Conditions to be addressed/monitored:  Hypertension, Hyperlipidemia, Diabetes, Chronic Kidney Disease, Allergic Rhinitis, and Bilateral leg edema and Neuropathy  Conditions addressed this visit: Diabetes, hypertension  Care Plan : CCM Pharmacy Care Plan  Updates made by  Viona Gilmore, RPH since 02/05/2022 12:00 AM     Problem: Problem: Hypertension, Hyperlipidemia, Diabetes, Chronic Kidney Disease, Allergic Rhinitis, and Bilateral leg edema and Neuropathy      Long-Range Goal: Patient-Specific Goal   Start Date: 11/26/2020  Expected End Date: 11/26/2021  Recent Progress: On track  Priority: High  Note:   Current Barriers:  Unable to achieve control of diabetes  Unable to self administer medications as prescribed  Pharmacist Clinical Goal(s):  Patient will achieve adherence to monitoring guidelines and medication adherence to achieve therapeutic efficacy achieve control of diabetes as  evidenced by A1c  through collaboration with PharmD and provider.   Interventions: 1:1 collaboration with [name], MD regarding development and update of comprehensive plan of care as evidenced by provider attestation and co-signature Inter-disciplinary care team collaboration (see longitudinal plan of care) Comprehensive medication review performed; medication list updated in electronic medical record  Hypertension (BP goal <140/90) -Controlled -Current treatment: Losartan 191m, 1 tablet once daily (in AM) - Appropriate, Effective, Safe, Accessible -Medications previously tried: none  -Current home readings: 120/70 (checks once a day or every other) - BP arm cuff -Current dietary habits: patient has been trying to read package labels for sodium content; doesn't cook with salt at all -Current exercise habits: did not discuss -Denies hypotensive/hypertensive symptoms -Educated on Importance of home blood pressure monitoring; Proper BP monitoring technique; Symptoms of hypotension and importance of maintaining adequate hydration; -Counseled to monitor BP at home at least weekly, document, and provide log at future appointments -Counseled on diet and exercise extensively Recommended to continue current medication  Hyperlipidemia: (LDL goal < 70) -Uncontrolled -Current treatment: Simvastatin 453m 1 tablet once daily (moderate intensity) - Appropriate, Query effective, Safe, Accessible Omega-3 120060m1 capsule twice daily - Query Appropriate, Query effective, Safe, Accessible -Medications previously tried: none  -Current dietary patterns: did not discuss -Current exercise habits: did not discuss -Educated on Cholesterol goals;  Benefits of statin for ASCVD risk reduction; Importance of limiting foods high in cholesterol; Exercise goal of 150 minutes per week; -Counseled on diet and exercise extensively Recommended to continue current medication  Diabetes (A1c goal  <7%) -Uncontrolled -Current medications: Dulaglutide (Trulicity) inject 3 mg once a week - Appropriate, Query effective, Safe, Accessible -Medications previously tried: metformin (kidney function), Victoza (cost)  -Current home glucose readings fasting glucose: 130 (checking a couple times a day) post prandial glucose: lower than first thing in the morning (checking around dinner time sometimes), usually around 100 -Denies hypoglycemic/hyperglycemic symptoms -Current meal patterns:  breakfast: did not discuss  lunch: same as dinner  dinner: one serving of vegetables, meat (chicken with some pasta or potato) snacks: did not discuss drinks: water or tea (unsweet or splenda or sweet n low) -Current exercise: did not discuss -Educated on A1c and blood sugar goals; Prevention and management of hypoglycemic episodes; Benefits of routine self-monitoring of blood sugar; Carbohydrate counting and/or plate method -Counseled to check feet daily and get yearly eye exams -Counseled on diet and exercise extensively Recommended to continue current medication Recommended trying vegetable pastas ([place], red lentil pasta) to cut down on carbs  Edema (Goal: minimize swelling) -Controlled -Current treatment  Furosemide 69m38m tablet daily - Appropriate, Effective, Safe, Accessible Potassium citrate 10mE32m tablet three times daily with meals  -Medications previously tried: none  -Counseled on weighing herself more consistently and to contact the office if she notices > 3 lbs change in 1 day or > 5 lbs in one week; patient  tries to be consistent with water intake and 4-5 glasses of water a day; patient also drinks a cup of tea in the morning and evening   Neuropathy (Goal: minimize pain) -Controlled -Current treatment  Gabapentin 124m, 2 capsules at night as needed for neuropathic pain - Appropriate, Effective, Safe, Accessible -Medications previously tried: none  -Recommended to continue current  medication  Osteoporosis / Osteopenia (Goal prevent fractures) -Not ideally controlled -Last DEXA Scan: could not locate  T-Score femoral neck: n/a  T-Score total hip: n/a  T-Score lumbar spine: n/a  T-Score forearm radius: n/a  10-year probability of major osteoporotic fracture: n/a  10-year probability of hip fracture: n/a -Patient is not a candidate for pharmacologic treatment -Current treatment  Cholecalciferol (vitamin D) 2000 units, 1 capsule on Monday through Thursday  - Appropriate, Effective, Safe, Accessible Multivitamin 200 mg calcium (1 calcium) - Appropriate, Effective, Safe, Accessible -Medications previously tried: none  -Recommend [phone] units of vitamin D daily. Recommend 1200 mg of calcium daily from dietary and supplemental sources. Recommend weight-bearing and muscle strengthening exercises for building and maintaining bone density. -Counseled on diet and exercise extensively Recommended repeat DEXA  Allergic rhinitis (Goal: minimize symptoms) -Controlled -Current treatment  Cetirizine 186m 1 tablet once daily as needed for allergies - Appropriate, Effective, Safe, Accessible Sodium chloride 0.65% nasal spray, instill 1 to 2 sprays into nose as needed for allergies - Appropriate, Effective, Safe, Accessible -Medications previously tried: none  -Recommended to continue current medication   Health Maintenance -Vaccine gaps: shingrix, COVID booster -Current therapy:  Acetaminophen 5006m1 tablet every six hours as needed for mild pain Miralax, 17 g once daily as needed Senna 8.6mg76m tablet once daily Multivitamin, 1 capsule once daily -Educated on Cost vs benefit of each product must be carefully weighed by individual consumer -Patient is satisfied with current therapy and denies issues -Recommended to continue current medication  Patient Goals/Self-Care Activities Patient will:  - take medications as prescribed check blood pressure weekly, document,  and provide at future appointments weigh daily, and contact provider if weight gain of > 3 lbs in one day target a minimum of 150 minutes of moderate intensity exercise weekly  Follow Up Plan: Telephone follow up appointment with care management team member scheduled for: 6 months        Medication Assistance: None required.  Patient affirms current coverage meets needs.  Compliance/Adherence/Medication fill history: Care Gaps: Shingrix, COVID booster, influenza, eye exam  Last BP - 136/66 on 11/06/2021 Last A1C - 9.5 on 11/06/2021  Star-Rating Drugs: Simvastatin 40 mg - [facility] - [facility]  [facility] [facility] [address] [id number]: [phone]: [phone] pill box? Yes Pt endorses 100% compliance  -Was using mail order - Medicare extra help  We discussed: Current pharmacy is preferred with insurance plan and patient is satisfied with pharmacy services Patient decided to: Continue current medication management strategy  Care Plan and Follow Up Patient Decision:  Patient agrees to Care Plan and Follow-up.  Plan: Telephone follow up appointment with care management team member scheduled for:  6 months  MadeJeni SallesarmD, BCACDauphin Islandrmacist [place]ras[place]-[phone]

## 2022-02-05 ENCOUNTER — Ambulatory Visit (INDEPENDENT_AMBULATORY_CARE_PROVIDER_SITE_OTHER): Payer: Medicare HMO | Admitting: Pharmacist

## 2022-02-05 DIAGNOSIS — I10 Essential (primary) hypertension: Secondary | ICD-10-CM

## 2022-02-05 DIAGNOSIS — Z89412 Acquired absence of left great toe: Secondary | ICD-10-CM | POA: Diagnosis not present

## 2022-02-05 DIAGNOSIS — E1122 Type 2 diabetes mellitus with diabetic chronic kidney disease: Secondary | ICD-10-CM

## 2022-02-05 DIAGNOSIS — E785 Hyperlipidemia, unspecified: Secondary | ICD-10-CM

## 2022-02-05 NOTE — Patient Instructions (Signed)
Hi Jordan [name],  It was great to catch up again! Don't forget to check on the losartan and make sure you are taking it regularly. Also, please bring your blood pressure cuff to your visit with Dr. Elease Hashimoto next week.  Please reach out to me if you have any questions or need anything before our follow up!  Best, Maddie  Jeni Salles, PharmD, [facility] [place]   Visit Information   Goals Addressed   None    Patient Care Plan: CCM Pharmacy Care Plan     Problem Identified: Problem: Hypertension, Hyperlipidemia, Diabetes, Chronic Kidney Disease, Allergic Rhinitis, and Bilateral leg edema and Neuropathy      Long-Range Goal: Patient-Specific Goal   Start Date: 11/26/2020  Expected End Date: 11/26/2021  Recent Progress: On track  Priority: High  Note:   Current Barriers:  Unable to achieve control of diabetes  Unable to self administer medications as prescribed  Pharmacist Clinical Goal(s):  Patient will achieve adherence to monitoring guidelines and medication adherence to achieve therapeutic efficacy achieve control of diabetes as evidenced by A1c  through collaboration with PharmD and provider.   Interventions: 1:1 collaboration with [name], MD regarding development and update of comprehensive plan of care as evidenced by provider attestation and co-signature Inter-disciplinary care team collaboration (see longitudinal plan of care) Comprehensive medication review performed; medication list updated in electronic medical record  Hypertension (BP goal <140/90) -Controlled -Current treatment: Losartan '100mg'$ , 1 tablet once daily (in AM) - Appropriate, Effective, Safe, Accessible -Medications previously tried: none  -Current home readings: 120/70 (checks once a day or every other) - BP arm cuff -Current dietary habits: patient has been trying to read package labels for sodium content; doesn't cook with salt at  all -Current exercise habits: did not discuss -Denies hypotensive/hypertensive symptoms -Educated on Importance of home blood pressure monitoring; Proper BP monitoring technique; Symptoms of hypotension and importance of maintaining adequate hydration; -Counseled to monitor BP at home at least weekly, document, and provide log at future appointments -Counseled on diet and exercise extensively Recommended to continue current medication  Hyperlipidemia: (LDL goal < 70) -Uncontrolled -Current treatment: Simvastatin '40mg'$ , 1 tablet once daily (moderate intensity) - Appropriate, Query effective, Safe, Accessible Omega-3 '1200mg'$ , 1 capsule twice daily - Query Appropriate, Query effective, Safe, Accessible -Medications previously tried: none  -Current dietary patterns: did not discuss -Current exercise habits: did not discuss -Educated on Cholesterol goals;  Benefits of statin for ASCVD risk reduction; Importance of limiting foods high in cholesterol; Exercise goal of 150 minutes per week; -Counseled on diet and exercise extensively Recommended to continue current medication  Diabetes (A1c goal <7%) -Uncontrolled -Current medications: Dulaglutide (Trulicity) inject 3 mg once a week - Appropriate, Query effective, Safe, Accessible -Medications previously tried: metformin (kidney function), Victoza (cost)  -Current home glucose readings fasting glucose: 130 (checking a couple times a day) post prandial glucose: lower than first thing in the morning (checking around dinner time sometimes), usually around 100 -Denies hypoglycemic/hyperglycemic symptoms -Current meal patterns:  breakfast: did not discuss  lunch: same as dinner  dinner: one serving of vegetables, meat (chicken with some pasta or potato) snacks: did not discuss drinks: water or tea (unsweet or splenda or sweet n low) -Current exercise: did not discuss -Educated on A1c and blood sugar goals; Prevention and management of  hypoglycemic episodes; Benefits of routine self-monitoring of blood sugar; Carbohydrate counting and/or plate method -Counseled to check feet daily and get yearly eye exams -Counseled  on diet and exercise extensively Recommended to continue current medication Recommended trying vegetable pastas (Banza, red lentil pasta) to cut down on carbs  Edema (Goal: minimize swelling) -Controlled -Current treatment  Furosemide '80mg'$ , 1 tablet daily - Appropriate, Effective, Safe, Accessible Potassium citrate 21mq, 1 tablet three times daily with meals  -Medications previously tried: none  -Counseled on weighing herself more consistently and to contact the office if she notices > 3 lbs change in 1 day or > 5 lbs in one week; patient tries to be consistent with water intake and 4-5 glasses of water a day; patient also drinks a cup of tea in the morning and evening   Neuropathy (Goal: minimize pain) -Controlled -Current treatment  Gabapentin '100mg'$ , 2 capsules at night as needed for neuropathic pain - Appropriate, Effective, Safe, Accessible -Medications previously tried: none  -Recommended to continue current medication  Osteoporosis / Osteopenia (Goal prevent fractures) -Not ideally controlled -Last DEXA Scan: could not locate  T-Score femoral neck: n/a  T-Score total hip: n/a  T-Score lumbar spine: n/a  T-Score forearm radius: n/a  10-year probability of major osteoporotic fracture: n/a  10-year probability of hip fracture: n/a -Patient is not a candidate for pharmacologic treatment -Current treatment  Cholecalciferol (vitamin D) 2000 units, 1 capsule on Monday through Thursday  - Appropriate, Effective, Safe, Accessible Multivitamin 200 mg calcium (1 calcium) - Appropriate, Effective, Safe, Accessible -Medications previously tried: none  -Recommend [phone] units of vitamin D daily. Recommend 1200 mg of calcium daily from dietary and supplemental sources. Recommend weight-bearing and muscle  strengthening exercises for building and maintaining bone density. -Counseled on diet and exercise extensively Recommended repeat DEXA  Allergic rhinitis (Goal: minimize symptoms) -Controlled -Current treatment  Cetirizine '10mg'$ , 1 tablet once daily as needed for allergies - Appropriate, Effective, Safe, Accessible Sodium chloride 0.65% nasal spray, instill 1 to 2 sprays into nose as needed for allergies - Appropriate, Effective, Safe, Accessible -Medications previously tried: none  -Recommended to continue current medication   Health Maintenance -Vaccine gaps: shingrix, COVID booster -Current therapy:  Acetaminophen '500mg'$ , 1 tablet every six hours as needed for mild pain Miralax, 17 g once daily as needed Senna 8.'6mg'$ , 1 tablet once daily Multivitamin, 1 capsule once daily -Educated on Cost vs benefit of each product must be carefully weighed by individual consumer -Patient is satisfied with current therapy and denies issues -Recommended to continue current medication  Patient Goals/Self-Care Activities Patient will:  - take medications as prescribed check blood pressure weekly, document, and provide at future appointments weigh daily, and contact provider if weight gain of > 3 lbs in one day target a minimum of 150 minutes of moderate intensity exercise weekly  Follow Up Plan: Telephone follow up appointment with care management team member scheduled for: 6 months       Patient verbalizes understanding of instructions and care plan provided today and agrees to view in [facility] MyChart status and patient understanding of how to access instructions and care plan via MyChart confirmed with patient.    Telephone follow up appointment with pharmacy team member scheduled for: 6 months  [facility] Gilmore [facility]

## 2022-02-10 ENCOUNTER — Ambulatory Visit: Payer: Medicare HMO | Admitting: Family Medicine

## 2022-02-10 DIAGNOSIS — G834 Cauda equina syndrome: Secondary | ICD-10-CM | POA: Diagnosis not present

## 2022-02-10 DIAGNOSIS — G809 Cerebral palsy, unspecified: Secondary | ICD-10-CM | POA: Diagnosis not present

## 2022-02-10 DIAGNOSIS — Z936 Other artificial openings of urinary tract status: Secondary | ICD-10-CM | POA: Diagnosis not present

## 2022-02-13 DIAGNOSIS — E785 Hyperlipidemia, unspecified: Secondary | ICD-10-CM

## 2022-02-13 DIAGNOSIS — E1140 Type 2 diabetes mellitus with diabetic neuropathy, unspecified: Secondary | ICD-10-CM | POA: Diagnosis not present

## 2022-02-13 DIAGNOSIS — I10 Essential (primary) hypertension: Secondary | ICD-10-CM

## 2022-02-13 DIAGNOSIS — Z7985 Long-term (current) use of injectable non-insulin antidiabetic drugs: Secondary | ICD-10-CM | POA: Diagnosis not present

## 2022-02-13 DIAGNOSIS — M810 Age-related osteoporosis without current pathological fracture: Secondary | ICD-10-CM | POA: Diagnosis not present

## 2022-02-28 ENCOUNTER — Other Ambulatory Visit: Payer: Self-pay | Admitting: Family Medicine

## 2022-02-28 DIAGNOSIS — I10 Essential (primary) hypertension: Secondary | ICD-10-CM

## 2022-03-03 ENCOUNTER — Other Ambulatory Visit: Payer: Self-pay | Admitting: Family Medicine

## 2022-03-03 DIAGNOSIS — I10 Essential (primary) hypertension: Secondary | ICD-10-CM

## 2022-03-06 ENCOUNTER — Other Ambulatory Visit: Payer: Self-pay | Admitting: Family Medicine

## 2022-03-12 DIAGNOSIS — G809 Cerebral palsy, unspecified: Secondary | ICD-10-CM | POA: Diagnosis not present

## 2022-03-12 DIAGNOSIS — G834 Cauda equina syndrome: Secondary | ICD-10-CM | POA: Diagnosis not present

## 2022-03-12 DIAGNOSIS — Z936 Other artificial openings of urinary tract status: Secondary | ICD-10-CM | POA: Diagnosis not present

## 2022-03-29 ENCOUNTER — Other Ambulatory Visit: Payer: Self-pay | Admitting: Family Medicine

## 2022-04-10 DIAGNOSIS — G809 Cerebral palsy, unspecified: Secondary | ICD-10-CM | POA: Diagnosis not present

## 2022-04-10 DIAGNOSIS — G834 Cauda equina syndrome: Secondary | ICD-10-CM | POA: Diagnosis not present

## 2022-04-10 DIAGNOSIS — Z936 Other artificial openings of urinary tract status: Secondary | ICD-10-CM | POA: Diagnosis not present

## 2022-04-15 ENCOUNTER — Ambulatory Visit (INDEPENDENT_AMBULATORY_CARE_PROVIDER_SITE_OTHER): Payer: Medicare HMO

## 2022-04-15 VITALS — Ht 69.0 in | Wt 200.0 lb

## 2022-04-15 DIAGNOSIS — Z0000 Encounter for general adult medical examination without abnormal findings: Secondary | ICD-10-CM | POA: Diagnosis not present

## 2022-04-15 NOTE — Progress Notes (Signed)
I connected with Jordan [name] today by telephone and verified that I am speaking with the correct person using two identifiers. Location patient: home Location provider: work Persons participating in the virtual visit: Jalayah, [place] LPN.   I discussed the limitations, risks, security and privacy concerns of performing an evaluation and management service by telephone and the availability of in person appointments. I also discussed with the patient that there may be a patient responsible charge related to this service. The patient expressed understanding and verbally consented to this telephonic visit.    Interactive audio and video telecommunications were attempted between this provider and patient, however failed, due to patient having technical difficulties OR patient did not have access to video capability.  We continued and completed visit with audio only.     Vital signs may be patient reported or missing.  Subjective:   Jordan [name] is a 76 y.o. female who presents for Medicare Annual (Subsequent) preventive examination.  Review of Systems     Cardiac Risk Factors include: advanced age (>10mn, >>39women);diabetes mellitus;dyslipidemia;hypertension     Objective:    Today's Vitals   04/15/22 0936  Weight: 200 lb (90.7 kg)  Height: 5' 9" (1.753 m)   Body mass index is 29.53 kg/m.     04/15/2022    9:40 AM 04/03/2021    9:42 AM 02/28/2020    9:54 AM 07/24/2017   10:30 AM 01/09/2017    3:06 PM 09/27/2016    1:57 PM 01/08/2016    8:37 PM  Advanced Directives  Does Patient Have a Medical Advance Directive? _0  No Yes  Type of [place]amed[place] will Healthcare Power of [place] will  Living will;Healthcare Power of [place] will  Does patient want to make changes to medical advance directive?   No - Patient declined      Copy of [place] Chart? No - copy requested No - copy requested No - copy requested  No - copy requested    Would patient like information on creating a medical advance directive?      No - Patient declined     Current Medications (verified) Outpatient Encounter Medications as of 04/15/2022  Medication Sig   Accu-Chek FastClix Lancets MISC Use to check blood sugar once daily. Dx Code E11.65   acetaminophen (TYLENOL) 500 MG tablet Take 500 mg by mouth every 6 (six) hours as needed for mild pain. Pain    Alcohol Swabs PADS Use as directed   Blood Glucose Calibration (TRUE METRIX LEVEL 1) Low SOLN Use as directed   Blood Glucose Monitoring Suppl (ACCU-CHEK NANO SMARTVIEW) w/Device KIT Use to check blood glucose 2 times daily.   Blood Glucose Monitoring Suppl (TRUE METRIX METER) DEVI Use as directed   cetirizine (ZYRTEC) 10 MG tablet Take 1 tablet (10 mg total) by mouth daily as needed. allergies   Cholecalciferol (VITAMIN D) 2000 UNITS CAPS Take 2,000 Units by mouth. Takes only on days Monday through Thursday   furosemide (LASIX) 80 MG tablet TAKE 1 TABLET(80 MG) BY MOUTH TWICE DAILY   gabapentin (NEURONTIN) 100 MG capsule TAKE 1 TO 2 CAPSULES BY MOUTH AT NIGHT AS NEEDED FOR NERVE PAIN   glucose blood (ACCU-CHEK SMARTVIEW) test strip USE TO CHECK BLOOD GLUCOSE TWICE DAILY AS DIRECTED   losartan (COZAAR) 100 MG tablet TAKE 1 TABLET BY MOUTH DAILY.   Multiple Vitamin (  MULTIVITAMIN) capsule Take 1 capsule by mouth daily.   Omega-3 Fatty Acids (FISH OIL) 1200 MG CAPS Take 1,200 mg by mouth 2 (two) times daily.   polyethylene glycol powder (GLYCOLAX/MIRALAX) powder Take 17 g by mouth daily as needed. One capfull daily by mouth with liquid   senna (SENOKOT) 8.6 MG tablet Take 1 tablet by mouth daily as needed.    simvastatin (ZOCOR) 40 MG tablet TAKE ONE TABLET BY MOUTH EVERY EVENING   sodium chloride (OCEAN) 0.65 % nasal spray Place 1-2 sprays into the nose as needed. Allergies    TRUEplus  Lancets 28G MISC Use as directed   TRULICITY 3 BW/4.6KZ SOPN INJECT 3MG UNDER THE SKIN ONCE WEEKLY AS DIRECTED   glucose blood (TRUE METRIX BLOOD GLUCOSE TEST) test strip Use as instructed twice a day   No facility-administered encounter medications on file as of 04/15/2022.    Allergies (verified) Morphine, Onglyza [saxagliptin], Baclofen, Lisinopril, Metoclopramide hcl, and Penicillins   History: 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   PT CAN TRANSFER BED TO CHAIR--CAN STAND BRIEFLY--BUT NOT ABLE TO WALK; RT LEG HYPEREXTENDS BACKWARD AND IS WEAK-HAS A POWER CHAIR   COLONIC POLYPS 07/11/2010   Constipation    Diabetes mellitus    ORAL MED FOR DIABETES   Gallbladder problem    GERD (gastroesophageal reflux disease)    PAST HISTORY GERD--WATCHES DIET - NOT ON ANY MEDS FOR GERD   HYPERLIPIDEMIA 11/29/2009   HYPERTENSION 11/29/2009   Incisional hernia    NO PAIN-NOT CAUSING ANY DISCOMFORT   Kidney infection    Osteoarthritis    Osteomyelitis (HCC)    PONV (postoperative nausea and vomiting)    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[place])    11-28-14 remains   Shortness of breath    Stroke (cerebrum) ([place])    Past Surgical History:  Procedure Laterality Date   1992     ABDOMINAL HYSTERECTOMY  1990   partial   [facility]     COLONOSCOPY WITH PROPOFOL N/A 12/07/2014   Procedure: COLONOSCOPY WITH PROPOFOL;  Surgeon: Gatha Mayer, MD;  Location: [facility];  Service: Endoscopy;  Laterality: N/A;   LEFT CARPAL TUNNEL RELEASE X 2     LUMBAR SYMPATHETECTOMIES X 2     MULTIPLE RIGHT ANKLE SURGERIES     NEPHROLITHOTOMY  10/01/2011   Procedure: NEPHROLITHOTOMY PERCUTANEOUS;  Surgeon: Franchot Gallo, MD;  Location: [facility];   Service: Urology;  Laterality: Right;        PARTIAL AMPUTATION OF LEFT GREAT TOE     1985   radical cystectomy  1992   neurogenic bladder sec CP   REPAIR OF NASAL FRACTURE  1985   REVISION UROSTOMY CUTANEOUS  1992   RIGHT CARPAL TUNNEL RELEASE     RIGHT HIP [place]   RT [place]   SEVERAL RT ANKLE SURGERIES PRIOR TO THE FUSION   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--AT [place]   Family  History  Problem Relation Age of Onset   Heart disease Mother 21       COPD   Hypertension Mother    Hyperlipidemia Mother    Liver disease Mother    Alcoholism Mother    Heart disease Father        COPD    Emphysema Father    Colon cancer Father 7       Died at 47   Bladder Cancer Father    Diabetes Father    Hyperlipidemia Father    Cancer Father    Liver disease Father    Sleep apnea Father    Alcoholism Father    Colon polyps Sister    Heart disease Maternal Grandmother    Heart attack Maternal Grandfather    Esophageal cancer Neg Hx    Kidney disease Neg Hx    Social History   Socioeconomic History   Marital status: Married    Spouse name: Not on file   Number of children: 1   Years of education: Not on file   Highest education level: Master's degree (e.g., MA, MS, MEng, MEd, MSW, MBA)  Occupational History   Occupation: Social worker; Retail banker  Tobacco Use   Smoking status: Never   Smokeless tobacco: Never  Vaping Use   Vaping Use: Never used  Substance and Sexual Activity   Alcohol use: No    Alcohol/week: 0.0 standard drinks of alcohol   Drug use: No   Sexual activity: Not on file  Other Topics Concern   Not on file  Social History Narrative   Married   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 [employer] Strain: [place]  (04/15/2022)   Overall  Financial Resource Strain (CARDIA)    Difficulty of Paying Living Expenses: Not hard at all  Food Insecurity: No Food Insecurity (04/15/2022)   Hunger Vital Sign    Worried About Running Out of Food in the Last Year: Never true    [place] in the Last Year: Never true  Transportation Needs: No Transportation Needs (04/15/2022)   PRAPARE - Hydrologist (Medical): No    Lack of Transportation (Non-Medical): No  Physical Activity: Insufficiently Active (04/15/2022)   Exercise Vital Sign    Days of Exercise per Week: 4 days    Minutes of Exercise per Session: 30 min  Stress: No Stress Concern Present (04/15/2022)   [place]    Feeling of Stress : Not at all  Social Connections: [place] (06/06/2021)   Social Connection and Isolation Panel [NHANES]    Frequency of Communication with Friends and Family: Three times a week    Frequency of Social Gatherings with Friends and Family: Once a week    Attends Religious Services: More than 4 times per year    Active Member of [employer] or Organizations: Yes    Attends Music therapist: More than 4 times per year    Marital Status: Married    Tobacco Counseling Counseling given: Not Answered   Clinical Intake:  Pre-visit preparation completed: Yes  Pain : No/denies pain     Nutritional Status: BMI 25 -29 Overweight Nutritional Risks: None Diabetes: Yes  How often do you need to have someone help you when you read instructions, pamphlets, or other written materials from your doctor or pharmacy?: 1 - Never What  is the last grade level you completed in school?: masters degree  Diabetic? Yes Nutrition Risk Assessment:  Has the patient had any N/V/D within the last 2 months?  No  Does the patient have any non-healing wounds?  No  Has the patient had any unintentional weight loss or weight gain?  No    Diabetes:  Is the patient diabetic?  Yes  If diabetic, was a CBG obtained today?  No  Did the patient bring in their glucometer from home?  No  How often do you monitor your CBG's? daily.   Financial Strains and Diabetes Management:  Are you having any financial strains with the device, your supplies or your medication? No .  Does the patient want to be seen by Chronic Care Management for management of their diabetes?  No  Would the patient like to be referred to a Nutritionist or for Diabetic Management?  No   Diabetic Exams:  Diabetic Eye Exam: Overdue for diabetic eye exam. Pt has been advised about the importance in completing this exam. Patient advised to call and schedule an eye exam. Diabetic Foot Exam: Overdue, Pt has been advised about the importance in completing this exam. Pt is scheduled for diabetic foot exam on next appointment.   Interpreter Needed?: No  Information entered by :: NAllen LPN   Activities of Daily Living    04/15/2022    9:41 AM  In your present state of health, do you have any difficulty performing the following activities:  Hearing? 0  Comment wears hearing aids  Vision? 0  Difficulty concentrating or making decisions? 0  Walking or climbing stairs? 0  Dressing or bathing? 0  Doing errands, shopping? 0  Preparing Food and eating ? N  Using the Toilet? N  In the past six months, have you accidently leaked urine? N  Do you have problems with loss of bowel control? N  Managing your Medications? N  Managing your Finances? N  Housekeeping or managing your Housekeeping? N    Patient Care Team: [name], MD as PCP - General (Family Medicine) Franchot Gallo, MD as Consulting Physician (Urology) Jari Pigg, MD as Consulting Physician (Dermatology) Constance Haw, MD as Consulting Physician (Cardiology) Viona Gilmore, [facility] as Pharmacist (Pharmacist)  Indicate any recent Medical Services you may have received from  other than [facility] providers in the past year (date may be approximate).     Assessment:   This is a routine wellness examination for [place].  Hearing/Vision screen Vision Screening - Comments:: Regular eye exams, Dr. Herbert Deaner  Dietary issues and exercise activities discussed: Current Exercise Habits: Home exercise routine, Type of exercise: calisthenics, Time (Minutes): 30, Frequency (Times/Week): 4, Weekly Exercise (Minutes/Week): 120   Goals Addressed             This Visit's Progress    Patient Stated       04/15/2022, no goals       Depression Screen    04/15/2022    9:41 AM 04/03/2021    9:41 AM 02/28/2020    9:57 AM 01/26/2019   11:00 AM 07/24/2017   10:28 AM 01/09/2017    3:06 PM 09/03/2016   10:29 AM  PHQ 2/9 Scores  PHQ - 2 Score 0 0 0 1 0 0 0  PHQ- 9 Score   0 3       Fall Risk    04/15/2022    9:41 AM 06/06/2021    9:46 AM 06/03/2021  4:37 PM 04/03/2021    9:43 AM 02/28/2020    9:56 AM  Fall Risk   Falls in the past year? 0 0 0   0 0 0  Number falls in past yr: 0   0 0  Injury with Fall? 0   0 0  Risk for fall due to : Medication side effect   Impaired vision;Impaired mobility Impaired mobility;Impaired balance/gait  Risk for fall due to: Comment    right ankle is fused   Follow up Falls prevention discussed;Education provided;Falls evaluation completed   Falls prevention discussed Falls evaluation completed;Falls prevention discussed    FALL RISK PREVENTION PERTAINING TO THE HOME:  Any stairs in or around the home? No  If so, are there any without handrails? N/a Home free of loose throw rugs in walkways, pet beds, electrical cords, etc? Yes  Adequate lighting in your home to reduce risk of falls? Yes   ASSISTIVE DEVICES UTILIZED TO PREVENT FALLS:  Life alert? No  Use of a cane, walker or w/c? No  Grab bars in the bathroom? Yes  Shower chair or bench in shower? Yes  Elevated toilet seat or a handicapped toilet? Yes   TIMED UP AND GO:  Was  the test performed? No .  .     Cognitive Function:        04/15/2022    9:42 AM 04/03/2021    9:47 AM  6CIT Screen  What Year? 0 points 0 points  What month? 0 points 0 points  What time? 3 points 0 points  Count back from 20 0 points 0 points  Months in reverse 0 points 2 points  Repeat phrase 0 points 0 points  Total Score 3 points 2 points    Immunizations Immunization History  Administered Date(s) Administered   Fluad Quad(high Dose 65+) 04/26/2019, 07/30/2020   Influenza Split 05/04/2011   Influenza Whole 04/16/2010   Influenza, High Dose Seasonal PF 04/15/2013, 05/14/2015, 04/10/2017, 03/10/2018   Influenza, Seasonal, Injecte, Preservative Fre 06/01/2012   Influenza,inj,Quad PF,6+ Mos 02/09/2014   Influenza-Unspecified 02/29/2016   Moderna Sars-Covid-2 Vaccination 07/29/2019, 08/26/2019   PFIZER(Purple Top)SARS-COV-2 Vaccination 04/26/2020   PPD Test 01/07/2011, 01/07/2011, 01/07/2011, 01/07/2011, 01/07/2011   Pneumococcal Conjugate-13 06/12/2014   Pneumococcal Polysaccharide-23 06/16/2004, 01/07/2011   Td 06/16/2005   Tdap 06/03/2011, 09/27/2016    TDAP status: Up to date  Flu Vaccine status: Due, Education has been provided regarding the importance of this vaccine. Advised may receive this vaccine at local pharmacy or Health Dept. Aware to provide a copy of the vaccination record if obtained from local pharmacy or Health Dept. Verbalized acceptance and understanding.  Pneumococcal vaccine status: Up to date  Covid-19 vaccine status: Completed vaccines  Qualifies for Shingles Vaccine? Yes   Zostavax completed No   Shingrix Completed?: No.    Education has been provided regarding the importance of this vaccine. Patient has been advised to call insurance company to determine out of pocket expense if they have not yet received this vaccine. Advised may also receive vaccine at local pharmacy or Health Dept. Verbalized acceptance and understanding.  Screening  Tests Health Maintenance  Topic Date Due   Zoster Vaccines- Shingrix (1 of 2) Never done   Diabetic kidney evaluation - Urine ACR  12/31/2011   COVID-19 Vaccine (4 - Mixed Product risk series) 06/21/2020   FOOT EXAM  07/30/2021   OPHTHALMOLOGY EXAM  07/30/2021   INFLUENZA VACCINE  01/14/2022   Medicare Annual Wellness (AWV)  04/03/2022  HEMOGLOBIN A1C  05/09/2022   Diabetic kidney evaluation - GFR measurement  11/07/2022   TETANUS/TDAP  09/28/2026   Pneumonia Vaccine 8+ Years old  Completed   DEXA SCAN  Completed   Hepatitis C Screening  Completed   HPV VACCINES  Aged Out   COLONOSCOPY (Pts 45-88yr Insurance coverage will need to be confirmed)  Discontinued    Health Maintenance  Health Maintenance Due  Topic Date Due   Zoster Vaccines- Shingrix (1 of 2) Never done   Diabetic kidney evaluation - Urine ACR  12/31/2011   COVID-19 Vaccine (4 - Mixed Product risk series) 06/21/2020   FOOT EXAM  07/30/2021   OPHTHALMOLOGY EXAM  07/30/2021   INFLUENZA VACCINE  01/14/2022   Medicare Annual Wellness (AWV)  04/03/2022    Colorectal cancer screening: No longer required.   Mammogram status: No longer required due to age.  Bone Density status: Completed 02/05/2016.   Lung Cancer Screening: (Low Dose CT Chest recommended if Age 78-80years, 30 pack-year currently smoking OR have quit w/in 15years.) does not qualify.   Lung Cancer Screening Referral: no  Additional Screening:  Hepatitis C Screening: does qualify; Completed 01/15/2016  Vision Screening: Recommended annual ophthalmology exams for early detection of glaucoma and other disorders of the eye. Is the patient up to date with their annual eye exam?  Yes  Who is the provider or what is the name of the office in which the patient attends annual eye exams? Dr. HHerbert DeanerIf pt is not established with a provider, would they like to be referred to a provider to establish care? No .   Dental Screening: Recommended annual dental  exams for proper oral hygiene  Community Resource Referral / Chronic Care Management: CRR required this visit?  No   CCM required this visit?   no     Plan:     I have personally reviewed and noted the following in the patient's chart:   Medical and social history Use of alcohol, tobacco or illicit drugs  Current medications and supplements including opioid prescriptions. Patient is not currently taking opioid prescriptions. Functional ability and status Nutritional status Physical activity Advanced directives List of other physicians Hospitalizations, surgeries, and ER visits in previous 12 months Vitals Screenings to include cognitive, depression, and falls Referrals and appointments  In addition, I have reviewed and discussed with patient certain preventive protocols, quality metrics, and best practice recommendations. A written personalized care plan for preventive services as well as general preventive health recommendations were provided to patient.     NKellie Simmering LPN   [id number]  Nurse Notes: none  Due to this being a virtual visit, the after visit summary with patients personalized plan was offered to patient via mail or my-chart.  Patient would like to access on my-chart

## 2022-04-15 NOTE — Patient Instructions (Signed)
Jordan [name] , Thank you for taking time to come for your Medicare Wellness Visit. I appreciate your ongoing commitment to your health goals. Please review the following plan we discussed and let me know if I can assist you in the future.   Screening recommendations/referrals: Colonoscopy: not required Mammogram: not required Bone Density: completed 02/05/2016 Recommended yearly ophthalmology/optometry visit for glaucoma screening and checkup Recommended yearly dental visit for hygiene and checkup  Vaccinations: Influenza vaccine: due Pneumococcal vaccine: completed 06/12/2014 Tdap vaccine: completed 09/27/2016, due 09/28/2026 Shingles vaccine: discussed   Covid-19: 04/26/2020, 08/26/2019, 07/29/2019  Advanced directives: Please bring a copy of your POA (Power of Attorney) and/or Living Will to your next appointment.   Conditions/risks identified: none  Next appointment: Follow up in one year for your annual wellness visit    Preventive Care 65 Years and Older, Female Preventive care refers to lifestyle choices and visits with your health care provider that can promote health and wellness. What does preventive care include? A yearly physical exam. This is also called an annual well check. Dental exams once or twice a year. Routine eye exams. Ask your health care provider how often you should have your eyes checked. Personal lifestyle choices, including: Daily care of your teeth and gums. Regular physical activity. Eating a healthy diet. Avoiding tobacco and drug use. Limiting alcohol use. Practicing safe sex. Taking low-dose aspirin every day. Taking vitamin and mineral supplements as recommended by your health care provider. What happens during an annual well check? The services and screenings done by your health care provider during your annual well check will depend on your age, overall health, lifestyle risk factors, and family history of disease. Counseling  Your health care  provider may ask you questions about your: Alcohol use. Tobacco use. Drug use. Emotional well-being. Home and relationship well-being. Sexual activity. Eating habits. History of falls. Memory and ability to understand (cognition). Work and work Statistician. Reproductive health. Screening  You may have the following tests or measurements: Height, weight, and BMI. Blood pressure. Lipid and cholesterol levels. These may be checked every 5 years, or more frequently if you are over 50 years old. Skin check. Lung cancer screening. You may have this screening every year starting at age 78 if you have a 30-pack-year history of smoking and currently smoke or have quit within the past 15 years. Fecal occult blood test (FOBT) of the stool. You may have this test every year starting at age 78. Flexible sigmoidoscopy or colonoscopy. You may have a sigmoidoscopy every 5 years or a colonoscopy every 10 years starting at age 78. Hepatitis C blood test. Hepatitis B blood test. Sexually transmitted disease (STD) testing. Diabetes screening. This is done by checking your blood sugar (glucose) after you have not eaten for a while (fasting). You may have this done every 1-3 years. Bone density scan. This is done to screen for osteoporosis. You may have this done starting at age 30. Mammogram. This may be done every 1-2 years. Talk to your health care provider about how often you should have regular mammograms. Talk with your health care provider about your test results, treatment options, and if necessary, the need for more tests. Vaccines  Your health care provider may recommend certain vaccines, such as: Influenza vaccine. This is recommended every year. Tetanus, diphtheria, and acellular pertussis (Tdap, Td) vaccine. You may need a Td booster every 10 years. Zoster vaccine. You may need this after age 78. Pneumococcal 13-valent conjugate (PCV13) vaccine. One dose is  recommended after age  78. Pneumococcal polysaccharide (PPSV23) vaccine. One dose is recommended after age  78. Talk to your health care provider about which screenings and vaccines you need and how often you need them. This information is not intended to replace advice given to you by your health care provider. Make sure you discuss any questions you have with your health care provider. Document Released: 06/29/2015 Document Revised: 02/20/2016 Document Reviewed: 04/03/2015 Elsevier Interactive Patient Education  2017 [employer] Prevention in the Home Falls can cause injuries. They can happen to people of all ages. There are many things you can do to make your home safe and to help prevent falls. What can I do on the outside of my home? Regularly fix the edges of walkways and driveways and fix any cracks. Remove anything that might make you trip as you walk through a door, such as a raised step or threshold. Trim any bushes or trees on the path to your home. Use bright outdoor lighting. Clear any walking paths of anything that might make someone trip, such as rocks or tools. Regularly check to see if handrails are loose or broken. Make sure that both sides of any steps have handrails. Any raised decks and porches should have guardrails on the edges. Have any leaves, snow, or ice cleared regularly. Use sand or salt on walking paths during winter. Clean up any spills in your garage right away. This includes oil or grease spills. What can I do in the bathroom? Use night lights. Install grab bars by the toilet and in the tub and shower. Do not use towel bars as grab bars. Use non-skid mats or decals in the tub or shower. If you need to sit down in the shower, use a plastic, non-slip stool. Keep the floor dry. Clean up any water that spills on the floor as soon as it happens. Remove soap buildup in the tub or shower regularly. Attach bath mats securely with double-sided non-slip rug tape. Do not have throw  rugs and other things on the floor that can make you trip. What can I do in the bedroom? Use night lights. Make sure that you have a light by your bed that is easy to reach. Do not use any sheets or blankets that are too big for your bed. They should not hang down onto the floor. Have a firm chair that has side arms. You can use this for support while you get dressed. Do not have throw rugs and other things on the floor that can make you trip. What can I do in the kitchen? Clean up any spills right away. Avoid walking on wet floors. Keep items that you use a lot in easy-to-reach places. If you need to reach something above you, use a strong step stool that has a grab bar. Keep electrical cords out of the way. Do not use floor polish or wax that makes floors slippery. If you must use wax, use non-skid floor wax. Do not have throw rugs and other things on the floor that can make you trip. What can I do with my stairs? Do not leave any items on the stairs. Make sure that there are handrails on both sides of the stairs and use them. Fix handrails that are broken or loose. Make sure that handrails are as long as the stairways. Check any carpeting to make sure that it is firmly attached to the stairs. Fix any carpet that is loose or worn. Avoid having  throw rugs at the top or bottom of the stairs. If you do have throw rugs, attach them to the floor with carpet tape. Make sure that you have a light switch at the top of the stairs and the bottom of the stairs. If you do not have them, ask someone to add them for you. What else can I do to help prevent falls? Wear shoes that: Do not have high heels. Have rubber bottoms. Are comfortable and fit you well. Are closed at the toe. Do not wear sandals. If you use a stepladder: Make sure that it is fully opened. Do not climb a closed stepladder. Make sure that both sides of the stepladder are locked into place. Ask someone to hold it for you, if  possible. Clearly mark and make sure that you can see: Any grab bars or handrails. First and last steps. Where the edge of each step is. Use tools that help you move around (mobility aids) if they are needed. These include: Canes. Walkers. Scooters. Crutches. Turn on the lights when you go into a dark area. Replace any light bulbs as soon as they burn out. Set up your furniture so you have a clear path. Avoid moving your furniture around. If any of your floors are uneven, fix them. If there are any pets around you, be aware of where they are. Review your medicines with your doctor. Some medicines can make you feel dizzy. This can increase your chance of falling. Ask your doctor what other things that you can do to help prevent falls. This information is not intended to replace advice given to you by your health care provider. Make sure you discuss any questions you have with your health care provider. Document Released: 03/29/2009 Document Revised: 11/08/2015 Document Reviewed: 07/07/2014 Elsevier Interactive Patient Education  2017 [employer].

## 2022-04-22 ENCOUNTER — Other Ambulatory Visit: Payer: Self-pay | Admitting: Family Medicine

## 2022-04-22 DIAGNOSIS — I10 Essential (primary) hypertension: Secondary | ICD-10-CM

## 2022-04-25 DIAGNOSIS — Z436 Encounter for attention to other artificial openings of urinary tract: Secondary | ICD-10-CM | POA: Diagnosis not present

## 2022-04-25 DIAGNOSIS — L98499 Non-pressure chronic ulcer of skin of other sites with unspecified severity: Secondary | ICD-10-CM | POA: Diagnosis not present

## 2022-04-28 DIAGNOSIS — G834 Cauda equina syndrome: Secondary | ICD-10-CM | POA: Diagnosis not present

## 2022-04-28 DIAGNOSIS — Z936 Other artificial openings of urinary tract status: Secondary | ICD-10-CM | POA: Diagnosis not present

## 2022-04-28 DIAGNOSIS — G809 Cerebral palsy, unspecified: Secondary | ICD-10-CM | POA: Diagnosis not present

## 2022-05-01 ENCOUNTER — Emergency Department (HOSPITAL_BASED_OUTPATIENT_CLINIC_OR_DEPARTMENT_OTHER)
Admission: EM | Admit: 2022-05-01 | Discharge: 2022-05-01 | Disposition: A | Discharge status: Home / Self Care | Payer: Medicare HMO | Attending: Emergency Medicine | Admitting: Emergency Medicine

## 2022-05-01 ENCOUNTER — Encounter (HOSPITAL_BASED_OUTPATIENT_CLINIC_OR_DEPARTMENT_OTHER): Payer: Self-pay | Admitting: Emergency Medicine

## 2022-05-01 ENCOUNTER — Other Ambulatory Visit: Payer: Self-pay

## 2022-05-01 ENCOUNTER — Emergency Department (HOSPITAL_BASED_OUTPATIENT_CLINIC_OR_DEPARTMENT_OTHER): Payer: Medicare HMO

## 2022-05-01 DIAGNOSIS — N179 Acute kidney failure, unspecified: Secondary | ICD-10-CM | POA: Diagnosis not present

## 2022-05-01 DIAGNOSIS — E119 Type 2 diabetes mellitus without complications: Secondary | ICD-10-CM | POA: Insufficient documentation

## 2022-05-01 DIAGNOSIS — N200 Calculus of kidney: Secondary | ICD-10-CM | POA: Diagnosis not present

## 2022-05-01 DIAGNOSIS — D649 Anemia, unspecified: Secondary | ICD-10-CM | POA: Insufficient documentation

## 2022-05-01 DIAGNOSIS — I10 Essential (primary) hypertension: Secondary | ICD-10-CM | POA: Diagnosis not present

## 2022-05-01 DIAGNOSIS — N3000 Acute cystitis without hematuria: Secondary | ICD-10-CM | POA: Insufficient documentation

## 2022-05-01 DIAGNOSIS — N99528 Other complication of other external stoma of urinary tract: Secondary | ICD-10-CM | POA: Diagnosis not present

## 2022-05-01 DIAGNOSIS — R944 Abnormal results of kidney function studies: Secondary | ICD-10-CM | POA: Insufficient documentation

## 2022-05-01 LAB — COMPREHENSIVE METABOLIC PANEL
ALT: 11 U/L (ref 0–44)
AST: 9 U/L — ABNORMAL LOW (ref 15–41)
Albumin: 3.9 g/dL (ref 3.5–5.0)
Alkaline Phosphatase: 103 U/L (ref 38–126)
Anion gap: 13 (ref 5–15)
BUN: 64 mg/dL — ABNORMAL HIGH (ref 8–23)
CO2: 18 mmol/L — ABNORMAL LOW (ref 22–32)
Calcium: 9.2 mg/dL (ref 8.9–10.3)
Chloride: 107 mmol/L (ref 98–111)
Creatinine, Ser: 2.32 mg/dL — ABNORMAL HIGH (ref 0.44–1.00)
GFR, Estimated: 21 mL/min — ABNORMAL LOW (ref >60)
Glucose, Bld: 195 mg/dL — ABNORMAL HIGH (ref 70–99)
Potassium: 4 mmol/L (ref 3.5–5.1)
Sodium: 138 mmol/L (ref 135–145)
Total Bilirubin: 0.3 mg/dL (ref 0.3–1.2)
Total Protein: 7.6 g/dL (ref 6.5–8.1)

## 2022-05-01 LAB — CBC WITH DIFFERENTIAL/PLATELET
Abs Immature Granulocytes: 0.05 10*3/uL (ref 0.00–0.07)
Basophils Absolute: 0 10*3/uL (ref 0.0–0.1)
Basophils Relative: 0 %
Eosinophils Absolute: 0.2 10*3/uL (ref 0.0–0.5)
Eosinophils Relative: 2 %
HCT: 33.6 % — ABNORMAL LOW (ref 36.0–46.0)
Hemoglobin: 10.3 g/dL — ABNORMAL LOW (ref 12.0–15.0)
Immature Granulocytes: 1 %
Lymphocytes Relative: 18 %
Lymphs Abs: 1.8 10*3/uL (ref 0.7–4.0)
MCH: 27.2 pg (ref 26.0–34.0)
MCHC: 30.7 g/dL (ref 30.0–36.0)
MCV: 88.9 fL (ref 80.0–100.0)
Monocytes Absolute: 0.6 10*3/uL (ref 0.1–1.0)
Monocytes Relative: 6 %
Neutro Abs: 7.4 10*3/uL (ref 1.7–7.7)
Neutrophils Relative %: 73 %
Platelets: 423 10*3/uL — ABNORMAL HIGH (ref 150–400)
RBC: 3.78 MIL/uL — ABNORMAL LOW (ref 3.87–5.11)
RDW: 13.6 % (ref 11.5–15.5)
WBC: 10.1 10*3/uL (ref 4.0–10.5)
nRBC: 0 % (ref 0.0–0.2)

## 2022-05-01 LAB — URINALYSIS, ROUTINE W REFLEX MICROSCOPIC
Bilirubin Urine: NEGATIVE
Glucose, UA: NEGATIVE mg/dL
Hgb urine dipstick: NEGATIVE
Ketones, ur: NEGATIVE mg/dL
Nitrite: NEGATIVE
Specific Gravity, Urine: 1.011 (ref 1.005–1.030)
pH: 6.5 (ref 5.0–8.0)

## 2022-05-01 MED ORDER — CEPHALEXIN 250 MG PO CAPS
500.0000 mg | ORAL_CAPSULE | Freq: Once | ORAL | Status: AC
  Administered 2022-05-01: 500 mg via ORAL
  Filled 2022-05-01: qty 2

## 2022-05-01 MED ORDER — SODIUM CHLORIDE 0.9 % IV BOLUS
1000.0000 mL | Freq: Once | INTRAVENOUS | Status: AC
  Administered 2022-05-01: 1000 mL via INTRAVENOUS

## 2022-05-01 MED ORDER — CEPHALEXIN 500 MG PO CAPS: 500.0000 mg | ORAL_CAPSULE | Freq: Three times a day (TID) | ORAL | 0 refills | Status: DC

## 2022-05-01 NOTE — ED Notes (Addendum)
Pt questioning why a CT was ordered.  Per EDP wants to make sure she does not have a deeper wound or abscess at urostomy site.  Pt and spouse verbalize understanding

## 2022-05-01 NOTE — Discharge Instructions (Addendum)
Keep the wound clean and dry.  Take the antibiotic as prescribed which will treat urinary urinary tract infection as well as the wound on your stomach.  Your kidney function is slightly worse compared to May and your primary doctor should recheck this next week.  Return to the ED with worsening pain, fever, vomiting, decreased urine output or other concerns.

## 2022-05-01 NOTE — ED Triage Notes (Signed)
Pt arrives to ED with c/o bleeding stoma that started today.

## 2022-05-01 NOTE — ED Provider Notes (Signed)
[facility] DEPT Provider Note   CSN: [id number] Arrival date & time: 05/01/22  1408     History  Chief Complaint  Patient presents with   Stoma Issue    Jordan [name] is a 76 y.o. female.  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  Has noticed a sore wound and open numbness to the skin next to her urostomy for the past 2 days with some bleeding.  Saw her urologist Dr. Diona Fanti 2 days ago and was told to "put powder" in the wound but has had increased pain and drainage since.  No fevers, chills, nausea or vomiting.  Not much pain.  No blood in the urine.  No dysuria.  No chest pain or shortness of breath.  No history of blood thinner use.  States some skin breakdown next to her urostomy due to the tape.  Sugars have been well controlled.  No blood thinner use.  The history is provided by the patient.       Home Medications Prior to Admission medications   Medication Sig Start Date End Date Taking? Authorizing Provider  Accu-Chek FastClix Lancets MISC Use to check blood sugar once daily. Dx Code E11.65 06/01/19   Burchette, Alinda Sierras, MD  acetaminophen (TYLENOL) 500 MG tablet Take 500 mg by mouth every 6 (six) hours as needed for mild pain. Pain     [provider]  Alcohol Swabs PADS Use as directed 05/31/20   Burchette, Alinda Sierras, MD  Blood Glucose Calibration (TRUE METRIX LEVEL 1) Low SOLN Use as directed 05/31/20   Burchette, Alinda Sierras, MD  Blood Glucose Monitoring Suppl (ACCU-CHEK NANO SMARTVIEW) w/Device KIT Use to check blood glucose 2 times daily. 06/21/19   Burchette, Alinda Sierras, MD  Blood Glucose Monitoring Suppl (TRUE METRIX METER) DEVI Use as directed 05/31/20   Burchette, Alinda Sierras, MD  cetirizine (ZYRTEC) 10 MG tablet Take 1 tablet (10 mg total) by mouth daily as needed. allergies 07/26/15   Burchette, Alinda Sierras, MD   Cholecalciferol (VITAMIN D) 2000 UNITS CAPS Take 2,000 Units by mouth. Takes only on days Monday through Thursday    [provider]  furosemide (LASIX) 80 MG tablet TAKE 1 TABLET(80 MG) BY MOUTH TWICE DAILY 03/31/22   Burchette, Alinda Sierras, MD  gabapentin (NEURONTIN) 100 MG capsule TAKE 1 TO 2 CAPSULES BY MOUTH AT NIGHT AS NEEDED FOR NERVE PAIN 03/07/22   Burchette, Alinda Sierras, MD  glucose blood (ACCU-CHEK SMARTVIEW) test strip USE TO CHECK BLOOD GLUCOSE TWICE DAILY AS DIRECTED 05/31/18   Burchette, Alinda Sierras, MD  glucose blood (TRUE METRIX BLOOD GLUCOSE TEST) test strip Use as instructed twice a day 05/31/20   [name], MD  losartan (COZAAR) 100 MG tablet TAKE 1 TABLET BY MOUTH DAILY 04/22/22   Burchette, Alinda Sierras, MD  Multiple Vitamin (MULTIVITAMIN) capsule Take 1 capsule by mouth daily.    [provider]  Omega-3 Fatty Acids (FISH OIL) 1200 MG CAPS Take 1,200 mg by mouth 2 (two) times daily.    [provider]  polyethylene glycol powder (GLYCOLAX/MIRALAX) powder Take 17 g by mouth daily as needed. One capfull daily by mouth with liquid    [provider]  senna (SENOKOT) 8.6 MG tablet Take 1 tablet by mouth daily as needed.     [provider]  simvastatin (ZOCOR) 40 MG tablet TAKE  ONE TABLET BY MOUTH EVERY EVENING 12/30/21   Burchette, Alinda Sierras, MD  sodium chloride (OCEAN) 0.65 % nasal spray Place 1-2 sprays into the nose as needed. Allergies     [provider]  TRUEplus Lancets 28G MISC Use as directed 05/31/20   Burchette, Alinda Sierras, MD  TRULICITY 3 AS/5.0NL SOPN INJECT 3MG UNDER THE SKIN ONCE WEEKLY AS DIRECTED 01/21/22   Burchette, Alinda Sierras, MD      Allergies    Morphine, Onglyza [saxagliptin], Baclofen, Lisinopril, Metoclopramide hcl, and Penicillins    Review of Systems   Review of Systems  Constitutional:  Negative for activity change, appetite change and fever.  HENT:  Negative for congestion and rhinorrhea.   Respiratory:   Negative for cough, chest tightness and shortness of breath.   Cardiovascular:  Negative for chest pain.  Gastrointestinal:  Positive for abdominal pain. Negative for nausea and vomiting.  Genitourinary:  Negative for dysuria and hematuria.  Musculoskeletal:  Negative for arthralgias and myalgias.  Skin:  Positive for wound.  Neurological:  Negative for weakness and headaches.   all other systems are negative except as noted in the HPI and PMH.    Physical Exam Updated Vital Signs BP (!) 115/43 (BP Location: Right Arm)   Pulse 69   Temp 97.9 F (36.6 C)   Resp 18   Ht _0  (1.753 m)   Wt 88.5 kg   SpO2 100%   BMI 28.80 kg/m  Physical Exam Vitals and nursing note reviewed.  Constitutional:      General: She is not in acute distress.    Appearance: She is well-developed.  HENT:     Head: Normocephalic and atraumatic.     Mouth/Throat:     Pharynx: No oropharyngeal exudate.  Eyes:     Conjunctiva/sclera: Conjunctivae normal.     Pupils: Pupils are equal, round, and reactive to light.  Neck:     Comments: No meningismus. Cardiovascular:     Rate and Rhythm: Normal rate and regular rhythm.     Heart sounds: Normal heart sounds. No murmur heard. Pulmonary:     Effort: Pulmonary effort is normal. No respiratory distress.     Breath sounds: Normal breath sounds.  Abdominal:     Palpations: Abdomen is soft.     Tenderness: There is no abdominal tenderness. There is no guarding or rebound.     Comments: Abdomen soft, left lower quadrant urostomy in place with clear urine in the bag.  There is a 2 cm area of skin breakdown lateral to the urostomy with mild surrounding clear drainage.  No fluctuance.  Musculoskeletal:        General: No tenderness. Normal range of motion.     Cervical back: Normal range of motion and neck supple.  Skin:    General: Skin is warm.  Neurological:     Mental Status: She is alert and oriented to person, place, and time.     Cranial Nerves: No  cranial nerve deficit.     Motor: No abnormal muscle tone.     Coordination: Coordination normal.     Comments:  5/5 strength throughout. CN 2-12 intact.Equal grip strength.   Psychiatric:        Behavior: Behavior normal.     ED Results / Procedures / Treatments   Labs (all labs ordered are listed, but only abnormal results are displayed) Labs Reviewed  CBC WITH DIFFERENTIAL/PLATELET - Abnormal; Notable for the following components:  Result Value   RBC 3.78 (*)    Hemoglobin 10.3 (*)    HCT 33.6 (*)    Platelets 423 (*)    All other components within normal limits  COMPREHENSIVE METABOLIC PANEL - Abnormal; Notable for the following components:   CO2 18 (*)    Glucose, Bld 195 (*)    BUN 64 (*)    Creatinine, Ser 2.32 (*)    AST 9 (*)    GFR, Estimated 21 (*)    All other components within normal limits  URINALYSIS, ROUTINE W REFLEX MICROSCOPIC - Abnormal; Notable for the following components:   APPearance HAZY (*)    Protein, ur TRACE (*)    Leukocytes,Ua LARGE (*)    Bacteria, UA MANY (*)    All other components within normal limits  URINE CULTURE    EKG None  Radiology CT ABDOMEN PELVIS WO CONTRAST  Result Date: 05/01/2022 CLINICAL DATA:  Abdominal pain, acute, nonlocalized wound near urostomy. Pt arrives to ED with c/o bleeding stoma that started today. EXAM: CT ABDOMEN AND PELVIS WITHOUT CONTRAST TECHNIQUE: Multidetector CT imaging of the abdomen and pelvis was performed following the standard protocol without IV contrast. RADIATION DOSE REDUCTION: This exam was performed according to the departmental dose-optimization program which includes automated exposure control, adjustment of the mA and/or kV according to patient size and/or use of iterative reconstruction technique. COMPARISON:  None Available. FINDINGS: Lower chest: No acute abnormality. Hepatobiliary: No focal liver abnormality. Status post cholecystectomy. No biliary dilatation. Pancreas: Diffusely  atrophic. No focal lesion. Otherwise normal pancreatic contour. No surrounding inflammatory changes. No main pancreatic ductal dilatation. Spleen: Normal in size without focal abnormality. Adrenals/Urinary Tract: No adrenal nodule bilaterally. Ileal conduit formation. Right urothelial thickening elongated 6 mm right nephrolithiasis. No hydronephrosis. Renal cortical scarring of the right kidney. No ureterolithiasis or hydroureter. The urinary bladder is unremarkable. Stomach/Bowel: Question partial small bowel malrotation. Stomach is within normal limits. No evidence of bowel wall thickening or dilatation. The appendix is not definitely identified with no inflammatory changes in the right lower quadrant to suggest acute appendicitis. Vascular/Lymphatic: No abdominal aorta or iliac aneurysm. Mild atherosclerotic plaque of the aorta and its branches. No abdominal, pelvic, or inguinal lymphadenopathy. Reproductive: Status post hysterectomy. No adnexal masses. There is a stable chronic 2.1 cm left pelvic mass (2:64). Other: No intraperitoneal free fluid. No intraperitoneal free gas. No organized fluid collection. Musculoskeletal: There is a Richter ventral wall hernia containing anti mesenteric loop of a short segment of transverse colon (2:48). 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 No suspicious lytic or blastic osseous lesions. No acute displaced fracture. Multilevel degenerative changes spine with severe intervertebral disc space narrowing at the L5-S1 level, mild retrolisthesis of L5 on S1, and posterior disc osteophyte complex formation. IMPRESSION: 1. Mild right urothelial thickening in a patient with an ileal conduit formation. Correlate with urinalysis for infection. 2. Nonobstructive 6 mm right nephrolithiasis. 3. 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 4. 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 Electronically Signed   By: Iven Finn M.D.   On: 05/01/2022 19:18    Procedures Procedures    Medications Ordered in ED Medications - No data to display  ED Course/ Medical Decision Making/ A&P  Medical Decision Making Amount and/or Complexity of Data Reviewed Labs: ordered. Decision-making details documented in ED Course. Radiology: ordered and independent interpretation performed. Decision-making details documented in ED Course. ECG/medicine tests: ordered and independent interpretation performed. Decision-making details documented in ED Course.  Risk Prescription drug management.  Urostomy with skin breakdown.  No fever, nausea, vomiting.  No chest pain or shortness of breath.  Patient appears well.  Abdomen soft without peritoneal signs  Labs show stable anemia.  Creatinine slightly worse from baseline is 2.3 from 2.0.  Patient given IV fluids.  She reports no nausea vomiting or diarrhea.  CT scan to be obtained to assess for deeper space infection or drainable abscess.  Patient given IV fluids.  Informed that her kidney function is slightly worse. CT scan does not show any deep space infection or abscess.  No bowel obstruction. 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  Abdomen is soft and nontender.  We will treat possible UTI will culture is pending.  Follow-up with PCP for recheck of creatinine next week.  Take antibiotics for possible UTI as well as skin infection near site of urostomy.  Keep wound clean and dry.  Follow-up with her primary doctor for recheck of this to ensure it is healing.  Return precautions discussed.        Final Clinical Impression(s) / ED Diagnoses Final diagnoses:  Complication of urostomy ([place])  Acute cystitis without hematuria  AKI (acute kidney injury) [facility])    Rx / DC Orders ED Discharge Orders     None         Jasmene Goswami, Annie Main, MD 05/01/22 1941

## 2022-05-01 NOTE — ED Notes (Signed)
Patient transported to CT 

## 2022-05-01 NOTE — ED Notes (Signed)
Discharge instructions, follow up care, and prescriptions reviewed and explained, pt verbalized understanding. Pt was assisted getting back into her motorized wheelchair without incident. Offered to help pt to vehicle, however pt and her husband declined.

## 2022-05-04 LAB — URINE CULTURE: Culture: >=100000 — AB

## 2022-05-05 ENCOUNTER — Telehealth (HOSPITAL_BASED_OUTPATIENT_CLINIC_OR_DEPARTMENT_OTHER): Payer: Self-pay | Admitting: *Deleted

## 2022-05-05 NOTE — Progress Notes (Signed)
ED Antimicrobial Stewardship Positive Culture Follow Up   Jordan [name] is an 76 y.o. female who presented to [facility] on 05/01/2022 with a chief complaint of wound next to urostomy.   Chief Complaint  Patient presents with   Stoma Issue    Recent Results (from the past 720 hour(s))  Urine Culture     Status: Abnormal   Collection Time: 05/01/22  4:03 PM   Specimen: Urine, Clean Catch  Result Value Ref Range Status   Specimen Description   Final    URINE, CLEAN CATCH Performed at [facility], [address], [place], [place] [address]    Special Requests   Final    NONE Performed at [facility], [address] [place], [place] [address]    Culture (A)  Final    >=100,000 COLONIES/mL PANTOEA SPECIES 50,000 COLONIES/mL ESCHERICHIA COLI    Report Status 05/04/2022 FINAL  Final   Organism ID, Bacteria PANTOEA SPECIES (A)  Final   Organism ID, Bacteria ESCHERICHIA COLI (A)  Final      Susceptibility   Escherichia coli - MIC*    AMPICILLIN <=2 SENSITIVE Sensitive     CEFAZOLIN <=4 SENSITIVE Sensitive     CEFEPIME <=0.12 SENSITIVE Sensitive     CEFTRIAXONE <=0.25 SENSITIVE Sensitive     CIPROFLOXACIN <=0.25 SENSITIVE Sensitive     GENTAMICIN <=1 SENSITIVE Sensitive     IMIPENEM <=0.25 SENSITIVE Sensitive     NITROFURANTOIN <=16 SENSITIVE Sensitive     TRIMETH/SULFA <=20 SENSITIVE Sensitive     AMPICILLIN/SULBACTAM <=2 SENSITIVE Sensitive     PIP/TAZO <=4 SENSITIVE Sensitive     * 50,000 COLONIES/mL ESCHERICHIA COLI   Pantoea species - MIC*    CEFAZOLIN >=64 RESISTANT Resistant     CEFEPIME <=0.12 SENSITIVE Sensitive     CEFTRIAXONE <=0.25 SENSITIVE Sensitive     CIPROFLOXACIN <=0.25 SENSITIVE Sensitive     GENTAMICIN <=1 SENSITIVE Sensitive     IMIPENEM 0.5 SENSITIVE Sensitive     NITROFURANTOIN <=16 SENSITIVE Sensitive     TRIMETH/SULFA <=20 SENSITIVE Sensitive     PIP/TAZO <=4 SENSITIVE Sensitive     * >=100,000 COLONIES/mL  PANTOEA SPECIES    '[x]'$  Treated with cephalexin, organism resistant to prescribed antimicrobial  Patient determined not to have true UTI infection; however, cephalexin is appropriate to treat the wound next to her urostomy.   Plan: Continue cephalexin 500 mg TID x 7 days   ED Provider: Dr. Sonia Side, DO    Gena Fray, PharmD PGY1 Pharmacy Resident  05/05/2022 9:46 AM Monday - Friday phone -  [phone] Saturday - Sunday phone - [phone]

## 2022-05-05 NOTE — Telephone Encounter (Signed)
Post ED Visit - Positive Culture Follow-up  Culture report reviewed by antimicrobial stewardship pharmacist: [facility] [facility] Team '[]'$  [name], Pharm.D. '[]'$  Heide Guile, Pharm.D., BCPS AQ-ID '[]'$  Parks Neptune, Pharm.D., BCPS '[]'$  Alycia Rossetti, Pharm.D., BCPS '[]'$  [place], Pharm.D., BCPS, AAHIVP '[]'$  Legrand Como, Pharm.D., BCPS, AAHIVP '[]'$  Salome Arnt, PharmD, BCPS '[]'$  Johnnette Gourd, PharmD, BCPS '[]'$  Hughes Better, PharmD, BCPS '[]'$  Leeroy Cha, PharmD '[]'$  Laqueta Linden, PharmD, BCPS '[x]'$  Carl Best, PharmD  [facility] Team '[]'$  Leodis Sias, PharmD '[]'$  Lindell Spar, PharmD '[]'$  Royetta Asal, PharmD '[]'$  Graylin Shiver, Rph '[]'$  Rema Fendt) Glennon Mac, PharmD '[]'$  Arlyn Dunning, PharmD '[]'$  Netta Cedars, PharmD '[]'$  Dia Sitter, PharmD '[]'$  Leone Haven, PharmD '[]'$  Gretta Arab, PharmD '[]'$  Theodis Shove, PharmD '[]'$  Peggyann Juba, PharmD '[]'$  Reuel Boom, PharmD   Positive urine culture Treated with Cephalexin,  Continue Cephalexin  Jordan [name] 05/05/2022, 10:10 AM

## 2022-05-19 DIAGNOSIS — G834 Cauda equina syndrome: Secondary | ICD-10-CM | POA: Diagnosis not present

## 2022-05-19 DIAGNOSIS — Z936 Other artificial openings of urinary tract status: Secondary | ICD-10-CM | POA: Diagnosis not present

## 2022-05-19 DIAGNOSIS — G809 Cerebral palsy, unspecified: Secondary | ICD-10-CM | POA: Diagnosis not present

## 2022-05-28 DIAGNOSIS — G834 Cauda equina syndrome: Secondary | ICD-10-CM | POA: Diagnosis not present

## 2022-05-28 DIAGNOSIS — Z936 Other artificial openings of urinary tract status: Secondary | ICD-10-CM | POA: Diagnosis not present

## 2022-05-28 DIAGNOSIS — G809 Cerebral palsy, unspecified: Secondary | ICD-10-CM | POA: Diagnosis not present

## 2022-06-02 DIAGNOSIS — G834 Cauda equina syndrome: Secondary | ICD-10-CM | POA: Diagnosis not present

## 2022-06-02 DIAGNOSIS — G809 Cerebral palsy, unspecified: Secondary | ICD-10-CM | POA: Diagnosis not present

## 2022-06-02 DIAGNOSIS — Z936 Other artificial openings of urinary tract status: Secondary | ICD-10-CM | POA: Diagnosis not present

## 2022-06-11 DIAGNOSIS — G809 Cerebral palsy, unspecified: Secondary | ICD-10-CM | POA: Diagnosis not present

## 2022-06-11 DIAGNOSIS — G834 Cauda equina syndrome: Secondary | ICD-10-CM | POA: Diagnosis not present

## 2022-06-11 DIAGNOSIS — Z936 Other artificial openings of urinary tract status: Secondary | ICD-10-CM | POA: Diagnosis not present

## 2022-06-12 DIAGNOSIS — G834 Cauda equina syndrome: Secondary | ICD-10-CM | POA: Diagnosis not present

## 2022-06-12 DIAGNOSIS — Z936 Other artificial openings of urinary tract status: Secondary | ICD-10-CM | POA: Diagnosis not present

## 2022-06-12 DIAGNOSIS — G809 Cerebral palsy, unspecified: Secondary | ICD-10-CM | POA: Diagnosis not present

## 2022-06-13 ENCOUNTER — Other Ambulatory Visit: Payer: Self-pay | Admitting: Family Medicine

## 2022-06-20 ENCOUNTER — Ambulatory Visit (INDEPENDENT_AMBULATORY_CARE_PROVIDER_SITE_OTHER): Payer: Medicare PPO | Admitting: Family Medicine

## 2022-06-20 ENCOUNTER — Encounter: Payer: Self-pay | Admitting: Family Medicine

## 2022-06-20 VITALS — BP 130/60 | HR 67 | Temp 97.9°F | Ht 69.0 in

## 2022-06-20 DIAGNOSIS — N1832 Chronic kidney disease, stage 3b: Secondary | ICD-10-CM | POA: Diagnosis not present

## 2022-06-20 DIAGNOSIS — R109 Unspecified abdominal pain: Secondary | ICD-10-CM

## 2022-06-20 DIAGNOSIS — E785 Hyperlipidemia, unspecified: Secondary | ICD-10-CM | POA: Diagnosis not present

## 2022-06-20 DIAGNOSIS — E1122 Type 2 diabetes mellitus with diabetic chronic kidney disease: Secondary | ICD-10-CM | POA: Diagnosis not present

## 2022-06-20 DIAGNOSIS — I10 Essential (primary) hypertension: Secondary | ICD-10-CM | POA: Diagnosis not present

## 2022-06-20 DIAGNOSIS — T148XXA Other injury of unspecified body region, initial encounter: Secondary | ICD-10-CM | POA: Diagnosis not present

## 2022-06-20 DIAGNOSIS — Z9889 Other specified postprocedural states: Secondary | ICD-10-CM | POA: Diagnosis not present

## 2022-06-20 DIAGNOSIS — S31109A Unspecified open wound of abdominal wall, unspecified quadrant without penetration into peritoneal cavity, initial encounter: Secondary | ICD-10-CM | POA: Diagnosis not present

## 2022-06-20 LAB — CBC WITH DIFFERENTIAL/PLATELET
Basophils Absolute: 0 10*3/uL (ref 0.0–0.1)
Basophils Relative: 0.6 % (ref 0.0–3.0)
Eosinophils Absolute: 0.2 10*3/uL (ref 0.0–0.7)
Eosinophils Relative: 1.9 % (ref 0.0–5.0)
HCT: 35.7 % — ABNORMAL LOW (ref 36.0–46.0)
Hemoglobin: 11 g/dL — ABNORMAL LOW (ref 12.0–15.0)
Lymphocytes Relative: 23.4 % (ref 12.0–46.0)
Lymphs Abs: 1.9 10*3/uL (ref 0.7–4.0)
MCHC: 30.8 g/dL (ref 30.0–36.0)
MCV: 86.1 fl (ref 78.0–100.0)
Monocytes Absolute: 0.5 10*3/uL (ref 0.1–1.0)
Monocytes Relative: 6.7 % (ref 3.0–12.0)
Neutro Abs: 5.5 10*3/uL (ref 1.4–7.7)
Neutrophils Relative %: 67.4 % (ref 43.0–77.0)
Platelets: 406 10*3/uL — ABNORMAL HIGH (ref 150.0–400.0)
RBC: 4.14 Mil/uL (ref 3.87–5.11)
RDW: 15.5 % (ref 11.5–15.5)
WBC: 8.1 10*3/uL (ref 4.0–10.5)

## 2022-06-20 LAB — LIPID PANEL
Cholesterol: 194 mg/dL (ref 0–200)
HDL: 31.5 mg/dL — ABNORMAL LOW (ref >39.00)
NonHDL: 162.96
Total CHOL/HDL Ratio: 6
Triglycerides: 232 mg/dL — ABNORMAL HIGH (ref 0.0–149.0)
VLDL: 46.4 mg/dL — ABNORMAL HIGH (ref 0.0–40.0)

## 2022-06-20 LAB — COMPREHENSIVE METABOLIC PANEL
ALT: 10 U/L (ref 0–35)
AST: 11 U/L (ref 0–37)
Albumin: 4 g/dL (ref 3.5–5.2)
Alkaline Phosphatase: 95 U/L (ref 39–117)
BUN: 38 mg/dL — ABNORMAL HIGH (ref 6–23)
CO2: 22 mEq/L (ref 19–32)
Calcium: 10 mg/dL (ref 8.4–10.5)
Chloride: 109 mEq/L (ref 96–112)
Creatinine, Ser: 1.63 mg/dL — ABNORMAL HIGH (ref 0.40–1.20)
GFR: 30.11 mL/min — ABNORMAL LOW (ref >60.00)
Glucose, Bld: 150 mg/dL — ABNORMAL HIGH (ref 70–99)
Potassium: 4.6 mEq/L (ref 3.5–5.1)
Sodium: 139 mEq/L (ref 135–145)
Total Bilirubin: 0.5 mg/dL (ref 0.2–1.2)
Total Protein: 7.4 g/dL (ref 6.0–8.3)

## 2022-06-20 LAB — LDL CHOLESTEROL, DIRECT: Direct LDL: 128 mg/dL

## 2022-06-20 LAB — HEMOGLOBIN A1C: Hgb A1c MFr Bld: 7.6 % — ABNORMAL HIGH (ref 4.6–6.5)

## 2022-06-20 NOTE — Progress Notes (Signed)
Established Patient Office Visit  Subjective   Patient ID: Jordan [name], female    DOB: 06-22-1945  Age: 77 y.o. MRN: [id number]  Chief Complaint  Patient presents with   Wound Check    Patient complains of wound on abdominal, x1 week     HPI   Jordan [name] is here accompanied by her husband.  She has multiple chronic problems including history of obesity, hypertension, type 2 diabetes, cerebral palsy, hyperlipidemia, history of total cystectomy with chronic urostomy, history of kidney stones  Her main complaint is increasing size of wound superior to her stoma.  Initially they stated this been present for couple weeks.  On further questioning though this has been present apparently for months.  In looking back in her urology note from November there was mention of 15 x 8 mm superficial wound 12 o'clock position superior to the stoma corresponding with where the adhesive for her pouch is.  This is increased since then.  She does have some associated pain.  Denies any fevers or chills.  She has type 2 diabetes with no recent labs.  She is on Trulicity once weekly.  She has hyperlipidemia treated with simvastatin.  She is on losartan for hypertension  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   PT CAN TRANSFER BED TO CHAIR--CAN STAND BRIEFLY--BUT NOT ABLE TO WALK; RT LEG HYPEREXTENDS BACKWARD AND IS WEAK-HAS A POWER CHAIR   COLONIC POLYPS 07/11/2010   Constipation    Diabetes mellitus    ORAL MED FOR DIABETES   Gallbladder problem    GERD (gastroesophageal reflux disease)    PAST HISTORY GERD--WATCHES DIET - NOT ON ANY MEDS FOR GERD   HYPERLIPIDEMIA 11/29/2009   HYPERTENSION 11/29/2009   Incisional hernia    NO PAIN-NOT CAUSING ANY DISCOMFORT   Kidney infection    Osteoarthritis    Osteomyelitis (HCC)    PONV (postoperative  nausea and vomiting)    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Jordan [name])    11-28-14 remains   Shortness of breath    Stroke (cerebrum) ([place])    Past Surgical History:  Procedure Laterality Date   1992     ABDOMINAL HYSTERECTOMY  1990   partial   [place]     COLONOSCOPY WITH PROPOFOL N/A 12/07/2014   Procedure: COLONOSCOPY WITH PROPOFOL;  Surgeon: Gatha Mayer, MD;  Location: [facility];  Service: Endoscopy;  Laterality: N/A;   LEFT CARPAL TUNNEL RELEASE X 2     LUMBAR SYMPATHETECTOMIES X 2     MULTIPLE RIGHT ANKLE SURGERIES     NEPHROLITHOTOMY  10/01/2011   Procedure: NEPHROLITHOTOMY PERCUTANEOUS;  Surgeon: Franchot Gallo, MD;  Location: [facility];  Service: Urology;  Laterality: Right;        PARTIAL AMPUTATION OF LEFT GREAT TOE     1985   radical cystectomy  1992   neurogenic bladder sec CP   REPAIR OF NASAL FRACTURE  1985   REVISION UROSTOMY CUTANEOUS  1992   RIGHT CARPAL TUNNEL RELEASE     RIGHT HIP [place]   RT Echo   SEVERAL RT ANKLE SURGERIES  PRIOR TO THE FUSION   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 She has never used smokeless tobacco. She reports that she does not drink alcohol and does not use drugs. family history includes Alcoholism in her father and mother; Bladder Cancer in her father; Cancer in her father; Colon cancer (age of onset: 59) in her father; Colon polyps in her sister; Diabetes in her father; Emphysema in her father; Heart attack in her maternal grandfather; Heart disease in her father and maternal grandmother; Heart disease (age of onset: 44) in her mother; Hyperlipidemia in her father and mother; Hypertension in her mother; Liver disease in her father and mother;  Sleep apnea in her father. Allergies  Allergen Reactions   Morphine Shortness Of Breath   Onglyza [Saxagliptin] Other (See Comments)    Headache within 45 minutes of taking this.   Baclofen     GI upset   Lisinopril Cough   Metoclopramide Hcl     Reglan, irritable   Penicillins     " MAJOR CASE OF HIVES"    Review of Systems  Constitutional:  Negative for chills and fever.  Respiratory:  Negative for cough and shortness of breath.   Cardiovascular:  Negative for chest pain.  Gastrointestinal:  Negative for nausea and vomiting.      Objective:     BP 130/60 (BP Location: Left Arm, Patient Position: Sitting, Cuff Size: Large)   Pulse 67   Temp 97.9 F (36.6 C) (Oral)   Ht '5\' 9"'$  (1.753 m)   SpO2 98%   BMI 28.80 kg/m    Physical Exam Vitals reviewed.  Cardiovascular:     Rate and Rhythm: Normal rate and regular rhythm.  Pulmonary:     Effort: Pulmonary effort is normal.     Breath sounds: Normal breath sounds.  Skin:    Comments: Her urostomy stoma looks good.  Healthy pink looking tissue.  Superior to this she has an area 2 x 6 cm of denuded epithelium.  Near the lateral border she has a little bit of purulence.  No surrounding erythema.  She does have a little bit of mild swelling and distention in this region but no significant tenderness to palpation other than around the wound itself.  Neurological:     Mental Status: She is alert.      No results found for any visits on 06/20/22.    The 10-year ASCVD risk score (Arnett DK, et al., 2019) is: 45.9%    Assessment & Plan:   #1 abdominal wound.  This is in the area underlying the adhesive area of her urostomy pouch and apparently has been progressing in size over several months.  Her urostomy itself looks good.  Concerning is that she has little bit of purulent drainage around a couple of borders (of the wound) and does appear to have little bit of swelling in this region, though no erythema.  She also does  not report any fevers or chills.  Nonacute abdomen.  Does have some periwound tenderness  -Culture was obtained of purulent looking drainage -Set up CT abdomen pelvis to rule out underlying abscess -Get back in soon to see urology -May need some wound care assistance from wound care center -Check CBC with differential  #2 type 2 diabetes.  Recheck A1c.  Patient is on Trulicity.  #3 hypertension treated with losartan.  Blood pressure stable today.  Continue losartan 100 mg daily  #4 hyperlipidemia treated with simvastatin.  Recheck lipid and hepatic panel   No follow-ups on file.    Carolann Littler, MD

## 2022-06-20 NOTE — Patient Instructions (Signed)
I will be setting up follow up with Urology  We are getting CT scan to rule out deeper infection such as abscess  Follow up immediately for any fever, increased abdominal pain, or increased redness of skin.

## 2022-06-21 ENCOUNTER — Ambulatory Visit (HOSPITAL_BASED_OUTPATIENT_CLINIC_OR_DEPARTMENT_OTHER)
Admission: RE | Admit: 2022-06-21 | Discharge: 2022-06-21 | Disposition: A | Discharge status: Home / Self Care | Payer: Medicare PPO | Source: Ambulatory Visit | Attending: Family Medicine | Admitting: Family Medicine

## 2022-06-21 ENCOUNTER — Other Ambulatory Visit: Payer: Self-pay | Admitting: Family Medicine

## 2022-06-21 DIAGNOSIS — E1122 Type 2 diabetes mellitus with diabetic chronic kidney disease: Secondary | ICD-10-CM

## 2022-06-21 DIAGNOSIS — R109 Unspecified abdominal pain: Secondary | ICD-10-CM

## 2022-06-21 DIAGNOSIS — T148XXA Other injury of unspecified body region, initial encounter: Secondary | ICD-10-CM

## 2022-06-21 DIAGNOSIS — I10 Essential (primary) hypertension: Secondary | ICD-10-CM

## 2022-06-21 DIAGNOSIS — E785 Hyperlipidemia, unspecified: Secondary | ICD-10-CM

## 2022-06-21 DIAGNOSIS — Z9889 Other specified postprocedural states: Secondary | ICD-10-CM

## 2022-06-21 NOTE — Progress Notes (Incomplete)
I-stat 1.8  GFR 28 06/21/22 - Pt unable to receive contrast

## 2022-06-23 ENCOUNTER — Telehealth: Payer: Self-pay | Admitting: Family Medicine

## 2022-06-23 MED ORDER — DOXYCYCLINE HYCLATE 100 MG PO TABS: 100.0000 mg | ORAL_TABLET | Freq: Two times a day (BID) | ORAL | 0 refills | Status: AC

## 2022-06-23 NOTE — Telephone Encounter (Signed)
Please see result note 

## 2022-06-23 NOTE — Addendum Note (Signed)
Addended by: Nilda Riggs on: 06/23/2022 08:19 AM   Modules accepted: Orders

## 2022-06-23 NOTE — Telephone Encounter (Signed)
Pt returning a call she said she received from this office

## 2022-06-24 LAB — WOUND CULTURE
MICRO NUMBER:: 14394165
SPECIMEN QUALITY:: ADEQUATE

## 2022-06-27 DIAGNOSIS — Z936 Other artificial openings of urinary tract status: Secondary | ICD-10-CM | POA: Diagnosis not present

## 2022-06-27 DIAGNOSIS — N200 Calculus of kidney: Secondary | ICD-10-CM | POA: Diagnosis not present

## 2022-06-27 DIAGNOSIS — G834 Cauda equina syndrome: Secondary | ICD-10-CM | POA: Diagnosis not present

## 2022-06-27 DIAGNOSIS — G809 Cerebral palsy, unspecified: Secondary | ICD-10-CM | POA: Diagnosis not present

## 2022-06-30 ENCOUNTER — Ambulatory Visit (HOSPITAL_COMMUNITY)
Admission: RE | Admit: 2022-06-30 | Discharge: 2022-06-30 | Disposition: A | Discharge status: Home / Self Care | Payer: Medicare PPO | Source: Ambulatory Visit | Attending: Nurse Practitioner | Admitting: Nurse Practitioner

## 2022-06-30 DIAGNOSIS — L24B3 Irritant contact dermatitis related to fecal or urinary stoma or fistula: Secondary | ICD-10-CM

## 2022-06-30 DIAGNOSIS — K9419 Other complications of enterostomy: Secondary | ICD-10-CM | POA: Diagnosis not present

## 2022-06-30 DIAGNOSIS — N99528 Other complication of other external stoma of urinary tract: Secondary | ICD-10-CM

## 2022-06-30 DIAGNOSIS — Z432 Encounter for attention to ileostomy: Secondary | ICD-10-CM | POA: Diagnosis present

## 2022-06-30 NOTE — Discharge Instructions (Signed)
New pouching instructions REmove old pouch and clean with mild soap and water.  Apply barrier ring around stoma Apply piece of silver (aquacel) to open wound and cover with Eakin seal piece Cut pouch to fit, 1 1/2"  Apply ostomy belt. I included adapter and nighttime bag.

## 2022-06-30 NOTE — Progress Notes (Signed)
[facility]   Reason for visit:  LMQ ileal conduit HPI:  Neurogenic bladder 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   PT CAN TRANSFER BED TO CHAIR--CAN STAND BRIEFLY--BUT NOT ABLE TO WALK; RT LEG HYPEREXTENDS BACKWARD AND IS WEAK-HAS A POWER CHAIR   COLONIC POLYPS 07/11/2010   Constipation    Diabetes mellitus    ORAL MED FOR DIABETES   Gallbladder problem    GERD (gastroesophageal reflux disease)    PAST HISTORY GERD--WATCHES DIET - NOT ON ANY MEDS FOR GERD   HYPERLIPIDEMIA 11/29/2009   HYPERTENSION 11/29/2009   Incisional hernia    NO PAIN-NOT CAUSING ANY DISCOMFORT   Kidney infection    Osteoarthritis    Osteomyelitis (HCC)    PONV (postoperative nausea and vomiting)    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[place])    11-28-14 remains   Shortness of breath    Stroke (cerebrum) ([place])    Family History  Problem Relation Age of Onset   Heart disease Mother 6       COPD   Hypertension Mother    Hyperlipidemia Mother    Liver disease Mother    Alcoholism Mother    Heart disease Father        COPD    Emphysema Father    Colon cancer Father 80       Died at 28   Bladder Cancer Father    Diabetes Father    Hyperlipidemia Father    Cancer Father    Liver disease Father    Sleep apnea Father    Alcoholism Father    Colon polyps Sister    Heart disease Maternal Grandmother    Heart attack Maternal Grandfather    Esophageal cancer Neg Hx    Kidney disease Neg Hx    Allergies  Allergen Reactions   Morphine Shortness Of Breath   Onglyza [Saxagliptin] Other (See Comments)    Headache within 45 minutes of taking this.   Baclofen     GI upset   Lisinopril Cough   Metoclopramide Hcl     Reglan, irritable   Penicillins     " MAJOR CASE OF HIVES"   Current Outpatient Medications  Medication Sig Dispense Refill  Last Dose   Accu-Chek FastClix Lancets MISC Use to check blood sugar once daily. Dx Code E11.65 102 each 1    acetaminophen (TYLENOL) 500 MG tablet Take 500 mg by mouth every 6 (six) hours as needed for mild pain. Pain       Alcohol Swabs PADS Use as directed 100 each 3    Blood Glucose Calibration (TRUE METRIX LEVEL 1) Low SOLN Use as directed 1 each 1    Blood Glucose Monitoring Suppl (ACCU-CHEK NANO SMARTVIEW) w/Device KIT Use to check blood glucose 2 times daily. 1 kit 1    Blood Glucose Monitoring Suppl (TRUE METRIX METER) DEVI Use as directed 1 each 1    cetirizine (ZYRTEC) 10 MG tablet Take 1 tablet (10 mg total) by mouth daily as needed. allergies 90 tablet 1    Cholecalciferol (VITAMIN D) 2000 UNITS CAPS Take 2,000 Units by mouth. Takes only on days Monday through Thursday      furosemide (LASIX) 80 MG tablet TAKE 1 TABLET(80 MG) BY MOUTH  TWICE DAILY 180 tablet 0    gabapentin (NEURONTIN) 100 MG capsule TAKE 1 TO 2 CAPSULES BY MOUTH AT NIGHT AS NEEDED FOR NERVE PAIN 180 capsule 0    glucose blood (ACCU-CHEK SMARTVIEW) test strip USE TO CHECK BLOOD GLUCOSE TWICE DAILY AS DIRECTED 100 each 3    losartan (COZAAR) 100 MG tablet TAKE 1 TABLET BY MOUTH DAILY 90 tablet 0    Multiple Vitamin (MULTIVITAMIN) capsule Take 1 capsule by mouth daily.      Omega-3 Fatty Acids (FISH OIL) 1200 MG CAPS Take 1,200 mg by mouth 2 (two) times daily.      polyethylene glycol powder (GLYCOLAX/MIRALAX) powder Take 17 g by mouth daily as needed. One capfull daily by mouth with liquid      senna (SENOKOT) 8.6 MG tablet Take 1 tablet by mouth daily as needed.       simvastatin (ZOCOR) 40 MG tablet TAKE ONE TABLET BY MOUTH EVERY EVENING 90 tablet 1    sodium chloride (OCEAN) 0.65 % nasal spray Place 1-2 sprays into the nose as needed. Allergies       TRUEplus Lancets 28G MISC Use as directed 502 each 3    TRULICITY 3 DX/4.1OI SOPN INJECT '3MG'$  UNDER THE SKIN ONCE WEEKLY AS DIRECTED 2 mL 1    No current  facility-administered medications for this encounter.   ROS  Review of Systems  Gastrointestinal:        LMQ ileal conduit  Genitourinary:        Ileal conduit  Musculoskeletal:        Cerebral palsy.  In motorized wheelchair  Skin:  Positive for rash and wound.       Peristomal breakdown  Neurological:        CP  Psychiatric/Behavioral: Negative.    All other systems reviewed and are negative.  Vital signs:  There were no vitals taken for this visit. Exam:  Physical Exam Constitutional:      Appearance: She is obese.  Abdominal:     Palpations: Abdomen is soft.  Skin:    General: Skin is warm and dry.  Neurological:     Mental Status: She is alert and oriented to person, place, and time.  Psychiatric:        Mood and Affect: Mood normal.        Behavior: Behavior normal.    Stoma type/location:  LMQ ileal conduit Stomal assessment/size:  1 3/8" pink and moist Peristomal assessment:  Full thickness tissue loss to perimeter pouching area.  Has had change in weight and abdomen may have changed some.  Does not wear belt or barrier ring Treatment options for stomal/peristomal skin: 1 piece convex  belt barrier ring  Treatment for breakdown (belt for support)  add aquacel to wound bed, cover with Eakin seal and pouch.  Was wearing convatec pouch, not available here in clinic. I fit with hollister pouch, belt and nighttime drainage bag with adaptor.  Output: clear yellow urine Ostomy pouching: 1pc. convex Education provided:  POuch change performed. Demonstrated how to apply barrier ring, topical wound care and ostomy belt.     Impression/dx  Peristomal breakdown Discussion  Wear belt at all times except shower Plan  See back in one week.     Visit time: 50 minutes.   Jordan Moras FNP-BC

## 2022-07-02 DIAGNOSIS — N99528 Other complication of other external stoma of urinary tract: Secondary | ICD-10-CM | POA: Insufficient documentation

## 2022-07-02 DIAGNOSIS — L24B3 Irritant contact dermatitis related to fecal or urinary stoma or fistula: Secondary | ICD-10-CM | POA: Insufficient documentation

## 2022-07-02 NOTE — Consult Note (Signed)
[facility]   Reason for visit:  Peristomal breakdown around ileal conduit HPI:  LLQ ileal conduit  Past Medical History:  Diagnosis Date  . Arthritis    OA  . Back pain   . Bilateral swelling of feet   . Blood transfusion 1992   AFTER  SURGERY TO REMOVE BLADDER  . Cataract   . 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 COLONIC POLYPS 07/11/2010  . Constipation   . Diabetes mellitus    ORAL MED FOR DIABETES  . Gallbladder problem   . GERD (gastroesophageal reflux disease)    PAST HISTORY GERD--WATCHES DIET - NOT ON ANY MEDS FOR GERD  . HYPERLIPIDEMIA 11/29/2009  . HYPERTENSION 11/29/2009  . Incisional hernia    NO PAIN-NOT CAUSING ANY DISCOMFORT  . Kidney infection   . Osteoarthritis   . Osteomyelitis ([place])   . PONV (postoperative nausea and vomiting)   . 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[place])    11-28-14 remains  . Shortness of breath   . Stroke (cerebrum) [facility])    Family History  Problem Relation Age of Onset  . Heart disease Mother 40       COPD  . Hypertension Mother   . Hyperlipidemia Mother   . Liver disease Mother   . Alcoholism Mother   . Heart disease Father        COPD   . Emphysema Father   . Colon cancer Father 57       Died at 64  . Bladder Cancer Father   . Diabetes Father   . Hyperlipidemia Father   . Cancer Father   . Liver disease Father   . Sleep apnea Father   . Alcoholism Father   . Colon polyps Sister   . Heart disease Maternal Grandmother   . Heart attack Maternal Grandfather   . Esophageal cancer Neg Hx   . Kidney disease Neg Hx    Allergies  Allergen Reactions  . Morphine Shortness Of Breath  . Onglyza [Saxagliptin] Other (See Comments)    Headache within 45 minutes of taking this.  . Baclofen     GI upset  . Lisinopril Cough  . Metoclopramide Hcl     Reglan, irritable  . Penicillins     " MAJOR CASE OF  HIVES"   Current Outpatient Medications  Medication Sig Dispense Refill Last Dose  . Accu-Chek FastClix Lancets MISC Use to check blood sugar once daily. Dx Code E11.65 102 each 1   . acetaminophen (TYLENOL) 500 MG tablet Take 500 mg by mouth every 6 (six) hours as needed for mild pain. Pain      . Alcohol Swabs PADS Use as directed 100 each 3   . Blood Glucose Calibration (TRUE METRIX LEVEL 1) Low SOLN Use as directed 1 each 1   . Blood Glucose Monitoring Suppl (ACCU-CHEK NANO SMARTVIEW) w/Device KIT Use to check blood glucose 2 times daily. 1 kit 1   . Blood Glucose Monitoring Suppl (TRUE METRIX METER) DEVI Use as directed 1 each 1   . cetirizine (ZYRTEC) 10 MG tablet Take 1 tablet (10 mg total) by mouth daily as needed. allergies 90 tablet 1   . Cholecalciferol (VITAMIN D) 2000 UNITS CAPS Take 2,000 Units by mouth. Takes only on days Monday through Thursday     . doxycycline (VIBRA-TABS) 100 MG tablet Take 1  tablet (100 mg total) by mouth 2 (two) times daily for 10 days. 20 tablet 0   . furosemide (LASIX) 80 MG tablet TAKE 1 TABLET(80 MG) BY MOUTH TWICE DAILY 180 tablet 0   . gabapentin (NEURONTIN) 100 MG capsule TAKE 1 TO 2 CAPSULES BY MOUTH AT NIGHT AS NEEDED FOR NERVE PAIN 180 capsule 0   . glucose blood (ACCU-CHEK SMARTVIEW) test strip USE TO CHECK BLOOD GLUCOSE TWICE DAILY AS DIRECTED 100 each 3   . losartan (COZAAR) 100 MG tablet TAKE 1 TABLET BY MOUTH DAILY 90 tablet 0   . Multiple Vitamin (MULTIVITAMIN) capsule Take 1 capsule by mouth daily.     . Omega-3 Fatty Acids (FISH OIL) 1200 MG CAPS Take 1,200 mg by mouth 2 (two) times daily.     . polyethylene glycol powder (GLYCOLAX/MIRALAX) powder Take 17 g by mouth daily as needed. One capfull daily by mouth with liquid     . senna (SENOKOT) 8.6 MG tablet Take 1 tablet by mouth daily as needed.      . simvastatin (ZOCOR) 40 MG tablet TAKE ONE TABLET BY MOUTH EVERY EVENING 90 tablet 1   . sodium chloride (OCEAN) 0.65 % nasal spray Place  1-2 sprays into the nose as needed. Allergies      . TRUEplus Lancets 28G MISC Use as directed 100 each 3   . TRULICITY 3 NG/2.9BM SOPN INJECT '3MG'$  UNDER THE SKIN ONCE WEEKLY AS DIRECTED 2 mL 1    No current facility-administered medications for this encounter.   ROS  Review of Systems  Gastrointestinal:        LLQ urostomy   Genitourinary:        Urostomy with clear yellow urine  Musculoskeletal:        Cerebral Palsy, uses motorized wheelchair  Skin:        Peristomal lesion  All other systems reviewed and are negative. Vital signs:  There were no vitals taken for this visit. Exam:  Physical Exam Constitutional:      Appearance: She is obese.  Abdominal:     Palpations: Abdomen is soft.     Comments: Peristomal breakdown.  Rounded abdomen  Musculoskeletal:        General: Deformity present.     Comments: Cerebral palsy  Skin:    Findings: Lesion present.  Neurological:     Mental Status: She is alert and oriented to person, place, and time.  Psychiatric:        Mood and Affect: Mood normal.        Behavior: Behavior normal.    Stoma type/location:  LLQ urostomy Stomal assessment/size:  1 1/2" budded, moist Peristomal assessment:  rounded abdomen, wears firm convex (Convatec) pouch, likely cause of breakdown Treatment options for stomal/peristomal skin: will add barrier ring and softer convexity pouch, add ostomy belt for added support.  Topical wound care for peristomal breakdown with silver hydrofiber covered with barrier ring segment.  Output: clear yellow urine Ostomy pouching: 1pc. Convex with barrier ring and belt.  Add aquacel over wound, cover with barrier ring.   Education provided:  performed pouch change, provided adaptor and bedside drainage bag  compatible with new pouch.  Provided additional pouch sample, barrier rings, aquacel and ostomy belt.     Impression/dx  Peristomal breakdown  Discussion  NEw pouch, rationale Provided additional samples.   Will update orders if patient prefers this pouch.     Visit time: 50 minutes.   Domenic Moras FNP-BC

## 2022-07-05 ENCOUNTER — Other Ambulatory Visit: Payer: Self-pay | Admitting: Family Medicine

## 2022-07-05 DIAGNOSIS — E781 Pure hyperglyceridemia: Secondary | ICD-10-CM

## 2022-07-07 ENCOUNTER — Ambulatory Visit (HOSPITAL_COMMUNITY)
Admission: RE | Admit: 2022-07-07 | Discharge: 2022-07-07 | Disposition: A | Discharge status: Home / Self Care | Payer: Medicare PPO | Source: Ambulatory Visit | Attending: Family Medicine | Admitting: Family Medicine

## 2022-07-07 DIAGNOSIS — L24B3 Irritant contact dermatitis related to fecal or urinary stoma or fistula: Secondary | ICD-10-CM | POA: Diagnosis not present

## 2022-07-07 DIAGNOSIS — Z932 Ileostomy status: Secondary | ICD-10-CM | POA: Diagnosis not present

## 2022-07-07 DIAGNOSIS — N99528 Other complication of other external stoma of urinary tract: Secondary | ICD-10-CM

## 2022-07-07 NOTE — Discharge Instructions (Signed)
Remove old pouch and gently clean skin and wound Apply piece of silver to wound   Squish barrier ring into solid flat piece and cover silver Cut opening to 1 1/8"  on pouch Apply pouch  Wear belt as much as possible to support hernia Stay connected to bedside drainage as much as possible to avoid weight from pouch pulling on skin.

## 2022-07-07 NOTE — Progress Notes (Signed)
[facility]   Reason for visit:  LUQ ileal conduit.  Peristomal breakdown. Not improved. Has not followed recommendations.  HPI:  Cerebral palsy 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   PT CAN TRANSFER BED TO CHAIR--CAN STAND BRIEFLY--BUT NOT ABLE TO WALK; RT LEG HYPEREXTENDS BACKWARD AND IS WEAK-HAS A POWER CHAIR   COLONIC POLYPS 07/11/2010   Constipation    Diabetes mellitus    ORAL MED FOR DIABETES   Gallbladder problem    GERD (gastroesophageal reflux disease)    PAST HISTORY GERD--WATCHES DIET - NOT ON ANY MEDS FOR GERD   HYPERLIPIDEMIA 11/29/2009   HYPERTENSION 11/29/2009   Incisional hernia    NO PAIN-NOT CAUSING ANY DISCOMFORT   Kidney infection    Osteoarthritis    Osteomyelitis (HCC)    PONV (postoperative nausea and vomiting)    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[place])    11-28-14 remains   Shortness of breath    Stroke (cerebrum) ([place])    Family History  Problem Relation Age of Onset   Heart disease Mother 43       COPD   Hypertension Mother    Hyperlipidemia Mother    Liver disease Mother    Alcoholism Mother    Heart disease Father        COPD    Emphysema Father    Colon cancer Father 97       Died at 31   Bladder Cancer Father    Diabetes Father    Hyperlipidemia Father    Cancer Father    Liver disease Father    Sleep apnea Father    Alcoholism Father    Colon polyps Sister    Heart disease Maternal Grandmother    Heart attack Maternal Grandfather    Esophageal cancer Neg Hx    Kidney disease Neg Hx    Allergies  Allergen Reactions   Morphine Shortness Of Breath   Onglyza [Saxagliptin] Other (See Comments)    Headache within 45 minutes of taking this.   Baclofen     GI upset   Lisinopril Cough   Metoclopramide Hcl     Reglan, irritable   Penicillins     " MAJOR CASE OF  HIVES"   Current Outpatient Medications  Medication Sig Dispense Refill Last Dose   Accu-Chek FastClix Lancets MISC Use to check blood sugar once daily. Dx Code E11.65 102 each 1    acetaminophen (TYLENOL) 500 MG tablet Take 500 mg by mouth every 6 (six) hours as needed for mild pain. Pain       Alcohol Swabs PADS Use as directed 100 each 3    Blood Glucose Calibration (TRUE METRIX LEVEL 1) Low SOLN Use as directed 1 each 1    Blood Glucose Monitoring Suppl (ACCU-CHEK NANO SMARTVIEW) w/Device KIT Use to check blood glucose 2 times daily. 1 kit 1    Blood Glucose Monitoring Suppl (TRUE METRIX METER) DEVI Use as directed 1 each 1    cetirizine (ZYRTEC) 10 MG tablet Take 1 tablet (10 mg total) by mouth daily as needed. allergies 90 tablet 1    Cholecalciferol (VITAMIN D) 2000 UNITS CAPS Take 2,000 Units by mouth. Takes only on days Monday through Thursday      furosemide (  LASIX) 80 MG tablet TAKE 1 TABLET(80 MG) BY MOUTH TWICE DAILY 180 tablet 0    gabapentin (NEURONTIN) 100 MG capsule TAKE 1 TO 2 CAPSULES BY MOUTH AT NIGHT AS NEEDED FOR NERVE PAIN 180 capsule 0    glucose blood (ACCU-CHEK SMARTVIEW) test strip USE TO CHECK BLOOD GLUCOSE TWICE DAILY AS DIRECTED 100 each 3    losartan (COZAAR) 100 MG tablet TAKE 1 TABLET BY MOUTH DAILY 90 tablet 0    Multiple Vitamin (MULTIVITAMIN) capsule Take 1 capsule by mouth daily.      Omega-3 Fatty Acids (FISH OIL) 1200 MG CAPS Take 1,200 mg by mouth 2 (two) times daily.      polyethylene glycol powder (GLYCOLAX/MIRALAX) powder Take 17 g by mouth daily as needed. One capfull daily by mouth with liquid      senna (SENOKOT) 8.6 MG tablet Take 1 tablet by mouth daily as needed.       simvastatin (ZOCOR) 40 MG tablet TAKE 1 TABLET BY MOUTH EVERY EVENING 90 tablet 1    sodium chloride (OCEAN) 0.65 % nasal spray Place 1-2 sprays into the nose as needed. Allergies       TRUEplus Lancets 28G MISC Use as directed 277 each 3    TRULICITY 3 AJ/2.8NO SOPN INJECT '3MG'$   UNDER THE SKIN ONCE WEEKLY AS DIRECTED 2 mL 1    No current facility-administered medications for this encounter.   ROS  Review of Systems  Gastrointestinal:        Parastomal hernia   LLQ ileal conduit  Genitourinary:        Urostomy  Musculoskeletal:        Cerebral palsy, has electric wheelchair. Can stand for short periods of time  Skin:  Positive for wound.       Medical adhesive related skin injury to pouching area  Psychiatric/Behavioral:  The patient is nervous/anxious.        Open wound is painful.  Is stressed about leaking pouch and skin breakdown.   All other systems reviewed and are negative.  Vital signs:  BP (!) 141/57 (BP Location: Right Arm)   Pulse 68   Temp 98.7 F (37.1 C) (Oral)   Resp 18   SpO2 99%  Exam:  Physical Exam Vitals reviewed.  Constitutional:      Appearance: She is obese.  Abdominal:     Palpations: Abdomen is soft.     Hernia: A hernia is present.  Genitourinary:    Comments: LLQ urostomy Skin:    General: Skin is warm and dry.     Findings: Lesion and rash present.  Neurological:     Mental Status: She is alert and oriented to person, place, and time.  Psychiatric:        Mood and Affect: Mood normal.        Behavior: Behavior normal.     Stoma type/location:  LLQ ileal conduit Stomal assessment/size:  1 1/8" pink and moist Peristomal assessment:  Large parastomal hernia at 10 o'clock.  Moderate bulging.  As patient moves, pouch is losing seal.  Unyielding adhesive is causing trauma to perimeter of pouching area.  We began silver hydrofiber to this area at last visit.  Patient did not continue topical wound care (silver hydrofiber).  Patient and spouse do not seem to understand the significance of the recommendations.  Patient does not want to wear ostomy belt.   Treatment options for stomal/peristomal skin: I discuss importance of belt barrier ring and topical wound care  with each pouch change.  Output: clear yellow urine  Ostomy  pouching: 1pc. Convex with barrier ring  aquacel covered with barrier ring to open wound .   Education provided:  stressed importance to wear belt, wound care at each pouch change.  Empty pouch when 1/3 full to decrease weight of bag and pulling     Impression/dx  Abdominal hernia Ileal conduit Contact dermatitis Medical adhesive related skin injury Discussion  See back in one week.  Plan  Given additional aquacel, barrier rings and pouches.     Visit time: 55 minutes.   Domenic Moras FNP-BC

## 2022-07-14 ENCOUNTER — Ambulatory Visit (HOSPITAL_COMMUNITY)
Admission: RE | Admit: 2022-07-14 | Discharge: 2022-07-14 | Disposition: A | Discharge status: Home / Self Care | Payer: Medicare PPO | Source: Ambulatory Visit | Attending: Family Medicine | Admitting: Family Medicine

## 2022-07-14 DIAGNOSIS — G809 Cerebral palsy, unspecified: Secondary | ICD-10-CM | POA: Diagnosis not present

## 2022-07-14 DIAGNOSIS — L24B3 Irritant contact dermatitis related to fecal or urinary stoma or fistula: Secondary | ICD-10-CM | POA: Diagnosis not present

## 2022-07-14 DIAGNOSIS — Z436 Encounter for attention to other artificial openings of urinary tract: Secondary | ICD-10-CM | POA: Insufficient documentation

## 2022-07-14 DIAGNOSIS — K4020 Bilateral inguinal hernia, without obstruction or gangrene, not specified as recurrent: Secondary | ICD-10-CM | POA: Diagnosis not present

## 2022-07-14 DIAGNOSIS — N99528 Other complication of other external stoma of urinary tract: Secondary | ICD-10-CM | POA: Diagnosis not present

## 2022-07-14 DIAGNOSIS — Z936 Other artificial openings of urinary tract status: Secondary | ICD-10-CM | POA: Diagnosis not present

## 2022-07-14 DIAGNOSIS — G834 Cauda equina syndrome: Secondary | ICD-10-CM | POA: Diagnosis not present

## 2022-07-14 NOTE — Progress Notes (Signed)
[facility]   Reason for visit:  LUQ urostomy HPI:  Neurogenic bladder 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   PT CAN TRANSFER BED TO CHAIR--CAN STAND BRIEFLY--BUT NOT ABLE TO WALK; RT LEG HYPEREXTENDS BACKWARD AND IS WEAK-HAS A POWER CHAIR   COLONIC POLYPS 07/11/2010   Constipation    Diabetes mellitus    ORAL MED FOR DIABETES   Gallbladder problem    GERD (gastroesophageal reflux disease)    PAST HISTORY GERD--WATCHES DIET - NOT ON ANY MEDS FOR GERD   HYPERLIPIDEMIA 11/29/2009   HYPERTENSION 11/29/2009   Incisional hernia    NO PAIN-NOT CAUSING ANY DISCOMFORT   Kidney infection    Osteoarthritis    Osteomyelitis (HCC)    PONV (postoperative nausea and vomiting)    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[place])    11-28-14 remains   Shortness of breath    Stroke (cerebrum) ([place])    Family History  Problem Relation Age of Onset   Heart disease Mother 29       COPD   Hypertension Mother    Hyperlipidemia Mother    Liver disease Mother    Alcoholism Mother    Heart disease Father        COPD    Emphysema Father    Colon cancer Father 74       Died at 3   Bladder Cancer Father    Diabetes Father    Hyperlipidemia Father    Cancer Father    Liver disease Father    Sleep apnea Father    Alcoholism Father    Colon polyps Sister    Heart disease Maternal Grandmother    Heart attack Maternal Grandfather    Esophageal cancer Neg Hx    Kidney disease Neg Hx    Allergies  Allergen Reactions   Morphine Shortness Of Breath   Onglyza [Saxagliptin] Other (See Comments)    Headache within 45 minutes of taking this.   Baclofen     GI upset   Lisinopril Cough   Metoclopramide Hcl     Reglan, irritable   Penicillins     " MAJOR CASE OF HIVES"   Current Outpatient Medications  Medication Sig Dispense Refill Last  Dose   Accu-Chek FastClix Lancets MISC Use to check blood sugar once daily. Dx Code E11.65 102 each 1    acetaminophen (TYLENOL) 500 MG tablet Take 500 mg by mouth every 6 (six) hours as needed for mild pain. Pain       Alcohol Swabs PADS Use as directed 100 each 3    Blood Glucose Calibration (TRUE METRIX LEVEL 1) Low SOLN Use as directed 1 each 1    Blood Glucose Monitoring Suppl (ACCU-CHEK NANO SMARTVIEW) w/Device KIT Use to check blood glucose 2 times daily. 1 kit 1    Blood Glucose Monitoring Suppl (TRUE METRIX METER) DEVI Use as directed 1 each 1    cetirizine (ZYRTEC) 10 MG tablet Take 1 tablet (10 mg total) by mouth daily as needed. allergies 90 tablet 1    Cholecalciferol (VITAMIN D) 2000 UNITS CAPS Take 2,000 Units by mouth. Takes only on days Monday through Thursday      furosemide (LASIX) 80 MG tablet TAKE 1 TABLET(80 MG) BY MOUTH TWICE  DAILY 180 tablet 0    gabapentin (NEURONTIN) 100 MG capsule TAKE 1 TO 2 CAPSULES BY MOUTH AT NIGHT AS NEEDED FOR NERVE PAIN 180 capsule 0    glucose blood (ACCU-CHEK SMARTVIEW) test strip USE TO CHECK BLOOD GLUCOSE TWICE DAILY AS DIRECTED 100 each 3    losartan (COZAAR) 100 MG tablet TAKE 1 TABLET BY MOUTH DAILY 90 tablet 0    Multiple Vitamin (MULTIVITAMIN) capsule Take 1 capsule by mouth daily.      Omega-3 Fatty Acids (FISH OIL) 1200 MG CAPS Take 1,200 mg by mouth 2 (two) times daily.      polyethylene glycol powder (GLYCOLAX/MIRALAX) powder Take 17 g by mouth daily as needed. One capfull daily by mouth with liquid      senna (SENOKOT) 8.6 MG tablet Take 1 tablet by mouth daily as needed.       simvastatin (ZOCOR) 40 MG tablet TAKE 1 TABLET BY MOUTH EVERY EVENING 90 tablet 1    sodium chloride (OCEAN) 0.65 % nasal spray Place 1-2 sprays into the nose as needed. Allergies       TRUEplus Lancets 28G MISC Use as directed 704 each 3    TRULICITY 3 UG/8.9VQ SOPN INJECT '3MG'$  UNDER THE SKIN ONCE WEEKLY AS DIRECTED 2 mL 1    No current  facility-administered medications for this encounter.   ROS  Review of Systems  Gastrointestinal:        LMQ urostomy  Genitourinary:        Urostomy  Skin:  Positive for rash and wound.       Skin breakdown to peristomal area  Neurological:        Cerebral palsy  All other systems reviewed and are negative.  Vital signs:  BP (!) 139/53 (BP Location: Right Arm)   Pulse 62   Temp 97.9 F (36.6 C) (Oral)   Resp 20   SpO2 100%  Exam:  Physical Exam Vitals reviewed.  Constitutional:      Appearance: She is obese.  Abdominal:     Palpations: Abdomen is soft.     Hernia: A hernia is present.  Musculoskeletal:     Comments: Cerebral palsy  Skin:    Findings: Lesion and rash present.  Neurological:     Mental Status: She is alert and oriented to person, place, and time.  Psychiatric:        Mood and Affect: Mood normal.        Behavior: Behavior normal.    Stoma type/location:  LUQ urostomy  parastomal hernia with peristomal full thickness breakdown, improved Stomal assessment/size:  1 3/8" slightly budded Peristomal assessment:  nonintact lesion at 12 o'clock  some new epithlial growth noted.  Treatment options for stomal/peristomal skin: continue silver hydrofiber.  New epithelial growth noted to wound bed on perimeter.  Still tender to touch.  Bleeds with cleansing.  Output: clear yellow urine Ostomy pouching: 1pc. Convex  due to medical adhesive related skin injury, will switch to coloplast 1 piece flexible convex pouch with barrier ring over silver.   Still does not prefer to wear belt.  Has tried since last visit and has worn nighttime drainage during the day to keep weight off of bag.   Education provided:  since offloading has yielded minimal improvement, will switch to pouch with tapeless backing.    Impression/dx  Skin breakdown HErnia urostomy Discussion  New pouch applied and given 3 extra pouch sets with silver and barrier rings.  Given adaptor.  see  back in  one week Plan  Return in one week to monitor progress   Visit time: 45 minutes.   Domenic Moras FNP-BC

## 2022-07-15 ENCOUNTER — Telehealth: Payer: Self-pay | Admitting: Family Medicine

## 2022-07-15 DIAGNOSIS — K4020 Bilateral inguinal hernia, without obstruction or gangrene, not specified as recurrent: Secondary | ICD-10-CM | POA: Insufficient documentation

## 2022-07-15 DIAGNOSIS — T148XXA Other injury of unspecified body region, initial encounter: Secondary | ICD-10-CM

## 2022-07-15 NOTE — Telephone Encounter (Signed)
Pt is calling and no longer want to go back to  [facility] for sore next to stoma. Pt would like a referral to another facility

## 2022-07-15 NOTE — Discharge Instructions (Signed)
Switch to coloplast Offload by emptying frequently and wearing belt.

## 2022-07-16 NOTE — Addendum Note (Signed)
Addended by: Nilda Riggs on: 07/16/2022 01:26 PM   Modules accepted: Orders

## 2022-07-16 NOTE — Telephone Encounter (Signed)
Referral placed and patient aware 

## 2022-07-16 NOTE — Telephone Encounter (Signed)
Pt does not want to go to  any [facility]

## 2022-07-16 NOTE — Telephone Encounter (Signed)
I spoke with the patient and she stated she would like referral sent to [place]. Referral placed

## 2022-07-19 ENCOUNTER — Other Ambulatory Visit: Payer: Self-pay | Admitting: Family Medicine

## 2022-07-19 DIAGNOSIS — I10 Essential (primary) hypertension: Secondary | ICD-10-CM

## 2022-07-21 ENCOUNTER — Ambulatory Visit (HOSPITAL_COMMUNITY)
Admission: RE | Admit: 2022-07-21 | Discharge: 2022-07-21 | Disposition: A | Discharge status: Home / Self Care | Payer: Medicare HMO | Source: Ambulatory Visit | Attending: Nurse Practitioner | Admitting: Nurse Practitioner

## 2022-07-21 DIAGNOSIS — N319 Neuromuscular dysfunction of bladder, unspecified: Secondary | ICD-10-CM | POA: Diagnosis not present

## 2022-07-21 DIAGNOSIS — G809 Cerebral palsy, unspecified: Secondary | ICD-10-CM | POA: Insufficient documentation

## 2022-07-21 DIAGNOSIS — N99528 Other complication of other external stoma of urinary tract: Secondary | ICD-10-CM | POA: Diagnosis not present

## 2022-07-21 DIAGNOSIS — L24B3 Irritant contact dermatitis related to fecal or urinary stoma or fistula: Secondary | ICD-10-CM | POA: Diagnosis not present

## 2022-07-21 DIAGNOSIS — K9419 Other complications of enterostomy: Secondary | ICD-10-CM | POA: Diagnosis not present

## 2022-07-21 DIAGNOSIS — K435 Parastomal hernia without obstruction or  gangrene: Secondary | ICD-10-CM | POA: Diagnosis not present

## 2022-07-21 DIAGNOSIS — K4020 Bilateral inguinal hernia, without obstruction or gangrene, not specified as recurrent: Secondary | ICD-10-CM | POA: Diagnosis not present

## 2022-07-21 DIAGNOSIS — L259 Unspecified contact dermatitis, unspecified cause: Secondary | ICD-10-CM | POA: Diagnosis not present

## 2022-07-21 NOTE — Progress Notes (Signed)
[facility]   Reason for visit:  LUQ ileal conduit HPI:  Cerebral palsy Neurogenic bladder Urostomy with peristomal breakdown 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   PT CAN TRANSFER BED TO CHAIR--CAN STAND BRIEFLY--BUT NOT ABLE TO WALK; RT LEG HYPEREXTENDS BACKWARD AND IS WEAK-HAS A POWER CHAIR   COLONIC POLYPS 07/11/2010   Constipation    Diabetes mellitus    ORAL MED FOR DIABETES   Gallbladder problem    GERD (gastroesophageal reflux disease)    PAST HISTORY GERD--WATCHES DIET - NOT ON ANY MEDS FOR GERD   HYPERLIPIDEMIA 11/29/2009   HYPERTENSION 11/29/2009   Incisional hernia    NO PAIN-NOT CAUSING ANY DISCOMFORT   Kidney infection    Osteoarthritis    Osteomyelitis (HCC)    PONV (postoperative nausea and vomiting)    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[place])    11-28-14 remains   Shortness of breath    Stroke (cerebrum) ([place])    Family History  Problem Relation Age of Onset   Heart disease Mother 77       COPD   Hypertension Mother    Hyperlipidemia Mother    Liver disease Mother    Alcoholism Mother    Heart disease Father        COPD    Emphysema Father    Colon cancer Father 70       Died at 59   Bladder Cancer Father    Diabetes Father    Hyperlipidemia Father    Cancer Father    Liver disease Father    Sleep apnea Father    Alcoholism Father    Colon polyps Sister    Heart disease Maternal Grandmother    Heart attack Maternal Grandfather    Esophageal cancer Neg Hx    Kidney disease Neg Hx    Allergies  Allergen Reactions   Morphine Shortness Of Breath   Onglyza [Saxagliptin] Other (See Comments)    Headache within 45 minutes of taking this.   Baclofen     GI upset   Lisinopril Cough   Metoclopramide Hcl     Reglan, irritable   Penicillins     " MAJOR CASE OF HIVES"   Current  Outpatient Medications  Medication Sig Dispense Refill Last Dose   Accu-Chek FastClix Lancets MISC Use to check blood sugar once daily. Dx Code E11.65 102 each 1    acetaminophen (TYLENOL) 500 MG tablet Take 500 mg by mouth every 6 (six) hours as needed for mild pain. Pain       Alcohol Swabs PADS Use as directed 100 each 3    Blood Glucose Calibration (TRUE METRIX LEVEL 1) Low SOLN Use as directed 1 each 1    Blood Glucose Monitoring Suppl (ACCU-CHEK NANO SMARTVIEW) w/Device KIT Use to check blood glucose 2 times daily. 1 kit 1    Blood Glucose Monitoring Suppl (TRUE METRIX METER) DEVI Use as directed 1 each 1    cetirizine (ZYRTEC) 10 MG tablet Take 1 tablet (10 mg total) by mouth daily as needed. allergies 90 tablet 1    Cholecalciferol (VITAMIN D) 2000 UNITS CAPS Take 2,000 Units by mouth. Takes only on days Monday through Thursday      furosemide (LASIX) 80 MG tablet  TAKE 1 TABLET(80 MG) BY MOUTH TWICE DAILY 180 tablet 0    gabapentin (NEURONTIN) 100 MG capsule TAKE 1 TO 2 CAPSULES BY MOUTH AT NIGHT AS NEEDED FOR NERVE PAIN 180 capsule 0    glucose blood (ACCU-CHEK SMARTVIEW) test strip USE TO CHECK BLOOD GLUCOSE TWICE DAILY AS DIRECTED 100 each 3    losartan (COZAAR) 100 MG tablet TAKE 1 TABLET BY MOUTH DAILY 90 tablet 0    Multiple Vitamin (MULTIVITAMIN) capsule Take 1 capsule by mouth daily.      Omega-3 Fatty Acids (FISH OIL) 1200 MG CAPS Take 1,200 mg by mouth 2 (two) times daily.      polyethylene glycol powder (GLYCOLAX/MIRALAX) powder Take 17 g by mouth daily as needed. One capfull daily by mouth with liquid      senna (SENOKOT) 8.6 MG tablet Take 1 tablet by mouth daily as needed.       simvastatin (ZOCOR) 40 MG tablet TAKE 1 TABLET BY MOUTH EVERY EVENING 90 tablet 1    sodium chloride (OCEAN) 0.65 % nasal spray Place 1-2 sprays into the nose as needed. Allergies       TRUEplus Lancets 28G MISC Use as directed 937 each 3    TRULICITY 3 DS/2.8JG SOPN INJECT '3MG'$  UNDER THE SKIN ONCE  WEEKLY AS DIRECTED 2 mL 1    No current facility-administered medications for this encounter.   ROS  Review of Systems  Gastrointestinal:        LUQ urostomy  Musculoskeletal:  Positive for gait problem.       Cerebral palsy, wheelchair bound  Skin:  Positive for wound.       Peristomal breakdown  Neurological:        Cerebral palsy  All other systems reviewed and are negative.  Vital signs:  BP (!) 137/47 (BP Location: Right Arm)   Pulse 70   Temp 97.6 F (36.4 C) (Oral)   Resp 17   SpO2 99%  Exam:  Physical Exam Vitals reviewed.  Constitutional:      Appearance: She is obese.  Abdominal:     Palpations: Abdomen is soft.     Hernia: A hernia is present.  Skin:    General: Skin is warm and dry.     Findings: Lesion present.  Neurological:     Mental Status: She is alert and oriented to person, place, and time.  Psychiatric:        Mood and Affect: Mood normal.        Behavior: Behavior normal.     Stoma type/location:  1 3/8" pink and moist Stomal assessment/size:  LUQ  Peristomal assessment:  noninact wound at 12 o'clock, improved and healing.  Photo in chart Treatment options for stomal/peristomal skin: Aquacel to open wound covered with barrier ring 1 piece convex urostomy pouch Output: clear yellow urine Ostomy pouching: 1pc. convex Education provided:  continue wound care Supplies given     Impression/dx  Contact dermatitis Parastomal hernia ileostomy Discussion  See back in 2 weeks.  Plan  See back    Visit time: 40 minutes.   Domenic Moras FNP-BC

## 2022-07-22 NOTE — Discharge Instructions (Signed)
Continue aquacel and barrier ring  supplies given

## 2022-07-24 DIAGNOSIS — G809 Cerebral palsy, unspecified: Secondary | ICD-10-CM | POA: Diagnosis not present

## 2022-07-24 DIAGNOSIS — G834 Cauda equina syndrome: Secondary | ICD-10-CM | POA: Diagnosis not present

## 2022-07-24 DIAGNOSIS — Z936 Other artificial openings of urinary tract status: Secondary | ICD-10-CM | POA: Diagnosis not present

## 2022-07-28 DIAGNOSIS — G809 Cerebral palsy, unspecified: Secondary | ICD-10-CM | POA: Diagnosis not present

## 2022-07-28 DIAGNOSIS — G834 Cauda equina syndrome: Secondary | ICD-10-CM | POA: Diagnosis not present

## 2022-07-28 DIAGNOSIS — Z936 Other artificial openings of urinary tract status: Secondary | ICD-10-CM | POA: Diagnosis not present

## 2022-07-29 NOTE — Progress Notes (Unsigned)
Care Management & Coordination Services Pharmacy Note  07/29/2022 Name:  Jordan [name] MRN:  [id number] DOB:  [birth year]  Summary: ***  Recommendations/Changes made from today's visit: ***  Follow up plan: ***   Subjective: Jordan [name] is an 77 y.o. year old female who is a primary patient of Burchette, Alinda Sierras, MD.  The care coordination team was consulted for assistance with disease management and care coordination needs.    {CCMTELEPHONEFACETOFACE:21091510} for follow up visit.  Recent office visits: 06/20/22 [name], MD - For open skin wound and follow up. START doxycycline 160m BID, referral to urology 04/15/22 NKellie Simmering LPN - For AWV  Recent consult visits: 07/21/22 KNelle Don FL[place]af/u. New supplies provided 07/14/22 KNelle Don [place]. New supplies provided 07/07/22 KNelle Don FR[place]f/u. New supplies provided 06/30/22 KNelle Don [place] [facility]: 05/01/22 SEzequiel Essex MD - For urostomy complication. Discharged same day. START keflex 5032mTID for possible UTI and skin infection at urostomy site   Objective:  Lab Results  Component Value Date   CREATININE 1.63 (H) 06/20/2022   BUN 38 (H) 06/20/2022   GFR 30.11 (L) 06/20/2022   GFRNONAA 21 (L) 05/01/2022   GFRAA 43 (L) 01/08/2016   NA 139 06/20/2022   K 4.6 06/20/2022   CALCIUM 10.0 06/20/2022   CO2 22 06/20/2022   GLUCOSE 150 (H) 06/20/2022    Lab Results  Component Value Date/Time   HGBA1C 7.6 (H) 06/20/2022 11:30 AM   HGBA1C 9.5 (H) 11/06/2021 07:28 AM   GFR 30.11 (L) 06/20/2022 11:30 AM   GFR 23.38 (L) 11/06/2021 07:28 AM   MICROALBUR 84.1 (H) 12/31/2010 09:03 AM    Last diabetic Eye exam:  Lab Results  Component Value Date/Time   HMDIABEYEEXA No Retinopathy 07/30/2020 12:00 AM    Last diabetic Foot exam:  Lab Results  Component Value Date/Time   HMDIABFOOTEX normal 12/20/2014 12:00 AM      Lab Results  Component Value Date   CHOL 194 06/20/2022   HDL 31.50 (L) 06/20/2022   LDLCALC 60 01/26/2019   LDLDIRECT 128.0 06/20/2022   TRIG 232.0 (H) 06/20/2022   CHOLHDL 6 06/20/2022       Latest Ref Rng & Units 06/20/2022   11:30 AM 05/01/2022    4:03 PM 11/06/2021    7:28 AM  Hepatic Function  Total Protein 6.0 - 8.3 g/dL 7.4  7.6  7.4   Albumin 3.5 - 5.2 g/dL 4.0  3.9  3.9   AST 0 - 37 U/L 11  9  10   $ ALT 0 - 35 U/L 10  11  15   $ Alk Phosphatase 39 - 117 U/L 95  103  101   Total Bilirubin 0.2 - 1.2 mg/dL 0.5  0.3  0.5   Bilirubin, Direct 0.0 - 0.3 mg/dL   0.1     Lab Results  Component Value Date/Time   TSH 1.130 01/26/2019 03:45 PM   TSH 1.27 01/04/2016 11:20 AM   FREET4 1.39 01/26/2019 03:45 PM       Latest Ref Rng & Units 06/20/2022   11:30 AM 05/01/2022    4:03 PM 11/06/2021    7:28 AM  CBC  WBC 4.0 - 10.5 K/uL 8.1  10.1  6.9   Hemoglobin 12.0 - 15.0 g/dL 11.0  10.3  10.9   Hematocrit 36.0 - 46.0 % 35.7  33.6  34.1   Platelets 150.0 - 400.0 K/uL 406.0  423  373.0     Lab Results  Component Value Date/Time   VITAMINB12 231 (L) 01/26/2019 03:45 PM    Clinical ASCVD: No  The 10-year ASCVD risk score (Arnett DK, et al., 2019) is: 49.5%   Values used to calculate the score:     Age: 74 years     Sex: Female     Is Non-Hispanic African American: No     Diabetic: Yes     Tobacco smoker: No     Systolic Blood Pressure: 0000000 mmHg     Is BP treated: Yes     HDL Cholesterol: 31.5 mg/dL     Total Cholesterol: 194 mg/dL         04/15/2022    9:41 AM 04/03/2021    9:41 AM 02/28/2020    9:57 AM  Depression screen PHQ 2/9  Decreased Interest 0 0 0  Down, Depressed, Hopeless 0 0 0  PHQ - 2 Score 0 0 0  Altered sleeping   0  Tired, decreased energy   0  Change in appetite   0  Feeling bad or failure about yourself    0  Trouble concentrating   0  Moving slowly or fidgety/restless   0  Suicidal thoughts   0  PHQ-9 Score   0  Difficult doing  work/chores   Not difficult at all     Social History   Tobacco Use  Smoking Status Never  Smokeless Tobacco Never   BP Readings from Last 3 Encounters:  07/21/22 (!) 137/47  07/14/22 (!) 139/53  07/07/22 (!) 141/57   Pulse Readings from Last 3 Encounters:  07/21/22 70  07/14/22 62  07/07/22 68   Wt Readings from Last 3 Encounters:  05/01/22 195 lb (88.5 kg)  04/15/22 200 lb (90.7 kg)  11/06/21 198 lb (89.8 kg)   BMI Readings from Last 3 Encounters:  06/20/22 28.80 kg/m  05/01/22 28.80 kg/m  04/15/22 29.53 kg/m    Allergies  Allergen Reactions   Morphine Shortness Of Breath   Onglyza [Saxagliptin] Other (See Comments)    Headache within 45 minutes of taking this.   Baclofen     GI upset   Lisinopril Cough   Metoclopramide Hcl     Reglan, irritable   Penicillins     " MAJOR CASE OF HIVES"    Medications Reviewed Today     Reviewed by [name], MD (Physician) on 06/20/22 at 1059  Med List Status: <None>   Medication Order Taking? Sig Documenting Provider Last Dose Status Informant  Accu-Chek FastClix Lancets MISC [id number] Yes Use to check blood sugar once daily. Dx Code [id number] [name], MD Taking Active   acetaminophen (TYLENOL) 500 MG tablet [id number] Yes Take 500 mg by mouth every 6 (six) hours as needed for mild pain. Pain  [provider] Taking Active Self  Alcohol Swabs PADS [id number] Yes Use as directed [name], MD Taking Active   Blood Glucose Calibration (TRUE METRIX LEVEL 1) Low SOLN [id number] Yes Use as directed [name], MD Taking Active   Blood Glucose Monitoring Suppl (ACCU-CHEK NANO SMARTVIEW) w/Device KIT [id number] Yes Use to check blood glucose 2 times daily. [name], MD Taking Active   Blood Glucose Monitoring Suppl ([address] [place]ta Breach) DEVI [id number] Yes Use as directed [name], MD Taking Active   Discontinued 06/20/22 1059   cetirizine (ZYRTEC)  10 MG tablet [id number]  Yes Take 1 tablet (10 mg total) by mouth daily as needed. allergies Burchette, Alinda Sierras, MD Taking Active   Cholecalciferol (VITAMIN D) 2000 UNITS CAPS [id number] Yes Take 2,000 Units by mouth. Takes only on days Monday through Thursday [provider] Taking Active Self  furosemide (LASIX) 80 MG tablet [id number] Yes TAKE 1 TABLET(80 MG) BY MOUTH TWICE DAILY Burchette, Alinda Sierras, MD Taking Active   gabapentin (NEURONTIN) 100 MG capsule [id number] Yes TAKE 1 TO 2 CAPSULES BY MOUTH AT NIGHT AS NEEDED FOR NERVE PAIN [name], MD Taking Active   glucose blood (ACCU-CHEK SMARTVIEW) test strip [id number] Yes USE TO CHECK BLOOD GLUCOSE TWICE DAILY AS DIRECTED Burchette, Alinda Sierras, MD Taking Active   losartan (COZAAR) 100 MG tablet [id number] Yes TAKE 1 TABLET BY MOUTH DAILY Burchette, Alinda Sierras, MD Taking Active   Multiple Vitamin (MULTIVITAMIN) capsule [id number] Yes Take 1 capsule by mouth daily. [provider] Taking Active Self  Omega-3 Fatty Acids (FISH OIL) 1200 MG CAPS [id number] Yes Take 1,200 mg by mouth 2 (two) times daily. [provider] Taking Active Self  polyethylene glycol powder (GLYCOLAX/MIRALAX) powder [id number] Yes Take 17 g by mouth daily as needed. One capfull daily by mouth with liquid [provider] Taking Active Self  senna (SENOKOT) 8.6 MG tablet [id number] Yes Take 1 tablet by mouth daily as needed.  [provider] Taking Active   simvastatin (ZOCOR) 40 MG tablet [id number] Yes TAKE ONE TABLET BY MOUTH EVERY EVENING Burchette, Alinda Sierras, MD Taking Active   sodium chloride (OCEAN) 0.65 % nasal spray [id number] Yes Place 1-2 sprays into the nose as needed. Allergies  [provider] Taking Active Self  TRUEplus Lancets 28G [place] [id number] Yes Use as directed [name], MD Taking Active   TRULICITY 3 0000000 Bonney Aid [id number] Yes INJECT 3MG UNDER THE SKIN ONCE WEEKLY AS DIRECTED Burchette, Alinda Sierras, MD Taking Active              SDOH:  (Social Determinants of Health) assessments and interventions performed: Yes SDOH Interventions    Flowsheet Row Clinical Support from 04/15/2022 in [place] [facility] [place] from 02/28/2020 in [place] [facility] from 12/01/2019 in [place] at [place] Interventions Intervention Not Indicated Intervention Not Indicated --  Housing Interventions -- Intervention Not Indicated --  Transportation Interventions Intervention Not Indicated Intervention Not Indicated Intervention Not Indicated  Depression Interventions/Treatment  -- PHQ2-9 Score <4 Follow-up Not Indicated --  Financial Strain Interventions Intervention Not Indicated Intervention Not Indicated Intervention Not Indicated  Physical Activity Interventions Intervention Not Indicated Intervention Not Indicated --  Stress Interventions Intervention Not Indicated Intervention Not Indicated --  Social Connections Interventions -- Intervention Not Indicated --       Medication Assistance: {MEDASSISTANCEINFO:25044}  Medication Access: Within the past 30 days, how often has patient missed a dose of medication? *** Is a pillbox or other method used to improve adherence? {YES/NO:21197} Factors that may affect medication adherence? {CHL DESC; BARRIERS:21522} Are meds synced by current pharmacy? {YES/NO:21197} Are meds delivered by current pharmacy? {YES/NO:21197} Does patient experience delays in picking up medications due to transportation concerns? {YES/NO:21197}  [facility] Reviewed: Is patient disadvantaged to use [facility]?: Yes  Current Rx insurance plan: [employer] Name and location of Current pharmacy:  [place] AT [facility] [address] [place] [address] Phone:  [phone] Fax: [phone]  [facility] services reviewed with patient today?: {YES/NO:21197} Patient requests to transfer care to [facility]?: {YES/NO:21197} Reason patient declined to change pharmacies: {US patient preference:27474}  Compliance/Adherence/Medication fill history: Care Gaps: Urine microalbumin Diabetic eye exam/foot exam Vaccine: Shingles, Covid  Star-Rating Drugs: Losartan 146m PDC 66% Simvastatin 429mPDC 95%  Assessment/Plan   Hypertension (BP goal <130/80) -{US controlled/uncontrolled:25276} -Current treatment: Losartan 10075md Lasix 52m91mD -Medications previously tried: None  -Current home readings: *** -Current dietary habits: *** -Current exercise habits: *** -{ACTIONS;DENIES/REPORTS:21021675::"Denies"} hypotensive/hypertensive symptoms -Educated on {CCM BP Counseling:25124} -Counseled to monitor BP at home ***, document, and provide log at future appointments -{CCMPHARMDINTERVENTION:25122}  Hyperlipidemia: (LDL goal < 70) -Uncontrolled -Current treatment: Simvastatin 40mg81md -Medications previously tried: None  -Current dietary patterns: *** -Current exercise habits: *** -Educated on {CCM HLD Counseling:25126} -{CCMPHARMDINTERVENTION:25122}  Diabetes (A1c goal <7%) -Uncontrolled -Current medications: Trulicity 3mg o51m weekly -Medications previously tried: Metformin (gfr excludes use), Januvia (headaches) -Current home glucose readings fasting glucose: *** post prandial glucose: *** -{ACTIONS;DENIES/REPORTS:21021675::"Denies"} hypoglycemic/hyperglycemic symptoms -Current meal patterns:  breakfast: ***  lunch: ***  dinner: *** snacks: *** drinks: *** -Current exercise: *** -Educated on {CCM DM COUNSELING:25123} -Counseled to check feet daily and get yearly eye exams -{CCMPHARMDINTERVENTION:25122}  [facility]ela[facility]ckenridgeacist [phone]

## 2022-08-04 ENCOUNTER — Inpatient Hospital Stay (HOSPITAL_COMMUNITY): Admission: RE | Admit: 2022-08-04 | Payer: Medicare HMO | Source: Ambulatory Visit | Admitting: Nurse Practitioner

## 2022-08-04 DIAGNOSIS — E119 Type 2 diabetes mellitus without complications: Secondary | ICD-10-CM | POA: Diagnosis not present

## 2022-08-04 DIAGNOSIS — I10 Essential (primary) hypertension: Secondary | ICD-10-CM | POA: Diagnosis not present

## 2022-08-05 ENCOUNTER — Telehealth: Payer: Self-pay

## 2022-08-05 NOTE — Progress Notes (Signed)
Care Management & Coordination Services Pharmacy Team  Reason for Encounter: Appointment Reminder  Contacted patient to confirm telephone appointment with Burman Riis, PharmD on 08/06/2022 at 11:45. Unsuccessful outreach. Left voicemail for patient to return call.  Do you have any problems getting your medications?  If yes what types of problems are you experiencing?   What is your top health concern you would like to discuss at your upcoming visit?   Have you seen any other providers since your last visit with PCP?    Care Gaps: AWV - completed 04/15/2022 Last eye exam / retinopathy screening:07/30/2020 Last diabetic foot exam:07/30/2020 Shingrix - never done Urine ACR - overdue Covid - overdue Flu - postponed  Star Rating Drug: Losartan 100 mg - last filled 10/19/2022 90 DS at [facility] Simvastatin 40 mg - last filled 07/07/2022 90 DS at [place] 0000000 ml - last filled 12/11/2021 28 DS at [facility] verified with pharmacist   [place] Pharmacist Assistant [phone]

## 2022-08-12 ENCOUNTER — Ambulatory Visit: Payer: Medicare Other

## 2022-08-12 NOTE — Progress Notes (Addendum)
Care Management & Coordination Services Pharmacy Note  08/12/2022 Name:  Jordan [name] MRN:  [id number] DOB:  1945-09-03  Summary: BP at goal <140/90 A1C elevated >7.0 LDL not at goal <70  Recommendations/Changes made from today's visit: -Check BP once weekly at home and keep a log. -Check sugars at least once daily and keep a log. Be mindful of carb intake in food -Limit fried, fatty foods high in cholesterol in diet  Follow up plan: DM call in 1 week Pharmacist visit in 3 months   Subjective: Jordan [name] is an 77 y.o. year old female who is a primary patient of [name], Jordan Sierras, MD.  The care coordination team was consulted for assistance with disease management and care coordination needs.    Engaged with patient by telephone for follow up visit.  Recent office visits: 06/20/22 [name], MD - For open skin wound and follow up. START doxycycline '100mg'$  BID, referral to urology 04/15/22 Kellie Simmering, LPN - For AWV  Recent consult visits: 07/21/22 Nelle Don, [place] f/u. New supplies provided 07/14/22 Nelle Don, [place] f/u. New supplies provided 07/07/22 Nelle Don, [place] f/u. New supplies provided 06/30/22 Nelle Don, [place] pt visit for osotomy clinic at [facility] visits: 05/01/22 Ezequiel Essex, MD - For urostomy complication. Discharged same day. START keflex '500mg'$  TID for possible UTI and skin infection at urostomy site   Objective:  Lab Results  Component Value Date   CREATININE 1.63 (H) 06/20/2022   BUN 38 (H) 06/20/2022   GFR 30.11 (L) 06/20/2022   GFRNONAA 21 (L) 05/01/2022   GFRAA 43 (L) 01/08/2016   NA 139 06/20/2022   K 4.6 06/20/2022   CALCIUM 10.0 06/20/2022   CO2 22 06/20/2022   GLUCOSE 150 (H) 06/20/2022    Lab Results  Component Value Date/Time   HGBA1C 7.6 (H) 06/20/2022 11:30 AM   HGBA1C 9.5 (H) 11/06/2021 07:28 AM   GFR 30.11 (L) 06/20/2022 11:30 AM   GFR 23.38 (L) 11/06/2021 07:28  AM   MICROALBUR 84.1 (H) 12/31/2010 09:03 AM    Last diabetic Eye exam:  Lab Results  Component Value Date/Time   HMDIABEYEEXA No Retinopathy 07/30/2020 12:00 AM    Last diabetic Foot exam:  Lab Results  Component Value Date/Time   HMDIABFOOTEX normal 12/20/2014 12:00 AM     Lab Results  Component Value Date   CHOL 194 06/20/2022   HDL 31.50 (L) 06/20/2022   LDLCALC 60 01/26/2019   LDLDIRECT 128.0 06/20/2022   TRIG 232.0 (H) 06/20/2022   CHOLHDL 6 06/20/2022       Latest Ref Rng & Units 06/20/2022   11:30 AM 05/01/2022    4:03 PM 11/06/2021    7:28 AM  Hepatic Function  Total Protein 6.0 - 8.3 g/dL 7.4  7.6  7.4   Albumin 3.5 - 5.2 g/dL 4.0  3.9  3.9   AST 0 - 37 U/L '11  9  10   '$ ALT 0 - 35 U/L '10  11  15   '$ Alk Phosphatase 39 - 117 U/L 95  103  101   Total Bilirubin 0.2 - 1.2 mg/dL 0.5  0.3  0.5   Bilirubin, Direct 0.0 - 0.3 mg/dL   0.1     Lab Results  Component Value Date/Time   TSH 1.130 01/26/2019 03:45 PM   TSH 1.27 01/04/2016 11:20 AM   FREET4 1.39 01/26/2019 03:45 PM  Latest Ref Rng & Units 06/20/2022   11:30 AM 05/01/2022    4:03 PM 11/06/2021    7:28 AM  CBC  WBC 4.0 - 10.5 K/uL 8.1  10.1  6.9   Hemoglobin 12.0 - 15.0 g/dL 11.0  10.3  10.9   Hematocrit 36.0 - 46.0 % 35.7  33.6  34.1   Platelets 150.0 - 400.0 K/uL 406.0  423  373.0     Lab Results  Component Value Date/Time   VITAMINB12 231 (L) 01/26/2019 03:45 PM    Clinical ASCVD: No  The 10-year ASCVD risk score (Arnett DK, et al., 2019) is: 49.5%   Values used to calculate the score:     Age: 22 years     Sex: Female     Is Non-Hispanic African American: No     Diabetic: Yes     Tobacco smoker: No     Systolic Blood Pressure: 0000000 mmHg     Is BP treated: Yes     HDL Cholesterol: 31.5 mg/dL     Total Cholesterol: 194 mg/dL         04/15/2022    9:41 AM 04/03/2021    9:41 AM 02/28/2020    9:57 AM  Depression screen PHQ 2/9  Decreased Interest 0 0 0  Down, Depressed, Hopeless 0  0 0  PHQ - 2 Score 0 0 0  Altered sleeping   0  Tired, decreased energy   0  Change in appetite   0  Feeling bad or failure about yourself    0  Trouble concentrating   0  Moving slowly or fidgety/restless   0  Suicidal thoughts   0  PHQ-9 Score   0  Difficult doing work/chores   Not difficult at all     Social History   Tobacco Use  Smoking Status Never  Smokeless Tobacco Never   BP Readings from Last 3 Encounters:  07/21/22 (!) 137/47  07/14/22 (!) 139/53  07/07/22 (!) 141/57   Pulse Readings from Last 3 Encounters:  07/21/22 70  07/14/22 62  07/07/22 68   Wt Readings from Last 3 Encounters:  05/01/22 195 lb (88.5 kg)  04/15/22 200 lb (90.7 kg)  11/06/21 198 lb (89.8 kg)   BMI Readings from Last 3 Encounters:  06/20/22 28.80 kg/m  05/01/22 28.80 kg/m  04/15/22 29.53 kg/m    Allergies  Allergen Reactions   Morphine Shortness Of Breath   Onglyza [Saxagliptin] Other (See Comments)    Headache within 45 minutes of taking this.   Baclofen     GI upset   Lisinopril Cough   Metoclopramide Hcl     Reglan, irritable   Penicillins     " MAJOR CASE OF HIVES"    Medications Reviewed Today     Reviewed by Maren Reamer, [place] (Pharmacist) on 08/12/22 at 1133  Med List Status: <None>   Medication Order Taking? Sig Documenting Provider Last Dose Status Informant  Accu-Chek FastClix Lancets MISC [id number] No Use to check blood sugar once daily. Dx Code [id number] [name], MD Taking Active   acetaminophen (TYLENOL) 500 MG tablet [id number] No Take 500 mg by mouth every 6 (six) hours as needed for mild pain. Pain  [provider] Taking Active Self  Alcohol Swabs PADS [id number] No Use as directed [name], MD Taking Active   Blood Glucose Calibration (TRUE METRIX LEVEL 1) Low SOLN [id number] No Use as directed [name], MD Taking Active  Blood Glucose Monitoring Suppl (ACCU-CHEK NANO SMARTVIEW) w/Device KIT [id number] No Use to  check blood glucose 2 times daily. [name], MD Taking Active   Blood Glucose Monitoring Suppl (TRUE METRIX METER) DEVI [id number] No Use as directed [name], MD Taking Active   cetirizine (ZYRTEC) 10 MG tablet [id number] No Take 1 tablet (10 mg total) by mouth daily as needed. allergies [name], Jordan Sierras, MD Taking Active   Cholecalciferol (VITAMIN D) 2000 UNITS CAPS [id number] No Take 2,000 Units by mouth. Takes only on days Monday through Thursday [provider] Taking Active Self  furosemide (LASIX) 80 MG tablet [id number]  TAKE 1 TABLET(80 MG) BY MOUTH TWICE DAILY [name], Jordan Sierras, MD  Active   gabapentin (NEURONTIN) 100 MG capsule [id number] No TAKE 1 TO 2 CAPSULES BY MOUTH AT NIGHT AS NEEDED FOR NERVE PAIN [name], MD Taking Active   glucose blood (ACCU-CHEK SMARTVIEW) test strip [id number] No USE TO CHECK BLOOD GLUCOSE TWICE DAILY AS DIRECTED [name], Jordan Sierras, MD Taking Active   losartan (COZAAR) 100 MG tablet [id number]  TAKE 1 TABLET BY MOUTH DAILY [name], Jordan Sierras, MD  Active   Multiple Vitamin (MULTIVITAMIN) capsule [id number] No Take 1 capsule by mouth daily. [provider] Taking Active Self  Omega-3 Fatty Acids (FISH OIL) 1200 MG CAPS [id number] No Take 1,200 mg by mouth 2 (two) times daily. [provider] Taking Active Self  polyethylene glycol powder (GLYCOLAX/MIRALAX) powder [id number] No Take 17 g by mouth daily as needed. One capfull daily by mouth with liquid [provider] Taking Active Self  senna (SENOKOT) 8.6 MG tablet [id number] No Take 1 tablet by mouth daily as needed.  [provider] Taking Active   simvastatin (ZOCOR) 40 MG tablet [id number]  TAKE 1 TABLET BY MOUTH EVERY EVENING [name], Jordan Sierras, MD  Active   sodium chloride (OCEAN) 0.65 % nasal spray [id number] No Place 1-2 sprays into the nose as needed. Allergies  [provider] Taking Active Self  TRUEplus Lancets 28G [place] [id number] No  Use as directed [name], MD Taking Active   TRULICITY 3 0000000 Bonney Aid [id number] No INJECT '3MG'$  UNDER THE SKIN ONCE WEEKLY AS DIRECTED [name], Jordan Sierras, MD Taking Active             SDOH:  (Social Determinants of Health) assessments and interventions performed: Yes SDOH Interventions    [place] Coordination from 08/12/2022 in [place] from 04/15/2022 in [place] at [place] from 02/28/2020 in [place] at [place] Management from 12/01/2019 in [place] at [place] Interventions Intervention Not Indicated Intervention Not Indicated Intervention Not Indicated --  Housing Interventions Intervention Not Indicated -- Intervention Not Indicated --  Transportation Interventions -- Intervention Not Indicated Intervention Not Indicated Intervention Not Indicated  Utilities Interventions Intervention Not Indicated -- -- --  Depression Interventions/Treatment  -- -- PHQ2-9 Score <4 Follow-up Not Indicated --  Financial Strain Interventions -- Intervention Not Indicated Intervention Not Indicated Intervention Not Indicated  Physical Activity Interventions -- Intervention Not Indicated Intervention Not Indicated --  Stress Interventions -- Intervention Not Indicated Intervention Not Indicated --  Social Connections Interventions -- -- Intervention Not Indicated --       Medication Assistance: None required.  Patient affirms current coverage meets needs.  Medication Access: Within the past 30 days, how often has patient  missed a dose of medication? None Is a pillbox or other method used to improve adherence? Yes  Factors that may affect medication adherence? no barriers identified Are meds synced by current pharmacy? No  Are meds delivered by current pharmacy? No  Does patient experience delays in picking  up medications due to transportation concerns? No   [facility] Services Reviewed: Is patient disadvantaged to use [facility]?: Yes  Current Rx insurance plan: [employer] Name and location of Current pharmacy:  [facility], [place] [address] [place] [address] Phone: [phone] Fax: [phone]  [facility] services reviewed with patient today?: No  Patient requests to transfer care to [facility] Pharmacy?: No  Reason patient declined to change pharmacies: Not mentioned at this visit  Compliance/Adherence/Medication fill history: Care Gaps: Urine microalbumin Diabetic eye exam/foot exam Vaccine: Shingles, Covid  Star-Rating Drugs: Losartan '100mg'$  PDC 66% Simvastatin '40mg'$  PDC 95%  Assessment/Plan   Hypertension (BP goal <130/80) -Controlled -Current treatment: Losartan '100mg'$  qd Appropriate, Effective, Safe, Accessible Lasix '80mg'$  BID Appropriate, Effective, Safe, Accessible -Medications previously tried: None  -Current home readings: Does not have a cuff and has not been checking at home -Current dietary habits: Is mindful of salt intake -Current exercise habits: Not discussed -Denies hypotensive/hypertensive symptoms -Educated on BP goals and benefits of medications for prevention of heart attack, stroke and kidney damage; Daily salt intake goal < 2300 mg; Importance of home blood pressure monitoring; -Counseled to monitor BP at home once weekly once new cuff is obtained, document, and provide log at future appointments -Recommended to continue current medication Recommended a new cuff to resume home monitoring  Hyperlipidemia: (LDL goal < 70) -Uncontrolled -Current treatment: Simvastatin '40mg'$  1 qd Appropriate, Query Effective -Medications previously tried: None  -Current dietary patterns: not discussed -Current exercise habits: not discussed -Educated on Cholesterol  goals;  Benefits of statin for ASCVD risk reduction; Importance of limiting foods high in cholesterol; -Counseled on diet and exercise extensively Recommended to continue current medication -If LDL still elevated at next check, will need to consider switch to moderate/high intensity statin/addition of zetia  Diabetes (A1c goal <7%) -Uncontrolled -Current medications: Trulicity '3mg'$  once weekly Appropriate, Query Effective -Medications previously tried: Metformin (gfr excludes use), Januvia (headaches) -Current home glucose readings - checking once daily fasting glucose: 120 yesterday, no log kept -Denies hypoglycemic/hyperglycemic symptoms -Current meal patterns:  drinks: water or unsweet tea -Educated on A1c and blood sugar goals; Complications of diabetes including kidney damage, retinal damage, and cardiovascular disease; Benefits of routine self-monitoring of blood sugar; -Counseled to check feet daily and get yearly eye exams -Recommended to continue current medication Counseled on keeping a log of sugars  [place] Pharmacist [phone]

## 2022-09-02 ENCOUNTER — Telehealth: Payer: Self-pay | Admitting: Family Medicine

## 2022-09-02 NOTE — Telephone Encounter (Signed)
Patient dropped off document  Verification of Chronic Condition , to be filled out by provider. Patient requested to Call Patient to pick up (Husband Erlene Senters) within 5-days. Document is located in providers tray at front office.  Please advise at Mobile [phone]

## 2022-09-05 DIAGNOSIS — Z936 Other artificial openings of urinary tract status: Secondary | ICD-10-CM | POA: Diagnosis not present

## 2022-09-05 DIAGNOSIS — G834 Cauda equina syndrome: Secondary | ICD-10-CM | POA: Diagnosis not present

## 2022-09-05 DIAGNOSIS — G809 Cerebral palsy, unspecified: Secondary | ICD-10-CM | POA: Diagnosis not present

## 2022-09-16 ENCOUNTER — Other Ambulatory Visit: Payer: Self-pay | Admitting: Family Medicine

## 2022-09-19 ENCOUNTER — Telehealth: Payer: Self-pay

## 2022-09-19 NOTE — Progress Notes (Signed)
Care Management & Coordination Services Pharmacy Team  Reason for Encounter: Diabetes  Contacted patient to discuss diabetes disease state. Spoke with patient on 09/19/2022   Current antihyperglycemic regimen:  Trulicity 3mg  once weekly   Patient verbally confirms she is taking the above medications as directed. Yes  What diet changes have been made to improve diabetes control? Patient is not following any specific diet Breakfast - patient will have toast and eggs or cereal Lunch - patient will have a salad or 1/2 sandwich Dinner - patient will have a meal with a meat, vegetable and a starch (meats are chicken, fish or a rare red meat)  What recent interventions/DTPs have been made to improve glycemic control:  Check sugars at least once daily and keep a log. Be mindful of carb intake  Have there been any recent hospitalizations or ED visits since last visit with PharmD? No recent hospital visits.   Patient denies hypoglycemic symptoms, including None  Patient denies hyperglycemic symptoms, including none  How often are you checking your blood sugar? Patient states she is checking twice daily however, she has not been writing them down  What are your blood sugars ranging?  Patient states her blood sugar readings have been between 80-150 both fasting and not fasting, patient has not been logging her readings.   During the week, how often does your blood glucose drop below 70?  Patient denies any readings below 70  Are you checking your feet daily/regularly? Patient states she is checking daily  Adherence Review: Is the patient currently on a STATIN medication? Yes Is the patient currently on ACE/ARB medication? Yes Does the patient have >5 day gap between last estimated fill dates? No  Care Gaps: AWV - completed 04/15/2022 Last eye exam - 07/30/2020 Last  foot exam - 07/30/2020 Shingrix - never done Urine ACR - overdue Covid - overdue   Star Rating Drug: Losartan 100 mg  - [facility] Simvastatin 40 mg - [facility] Trulicity 3mg /0.5 ml - last filled [facility]/11/2021 [facility] verified with [facility]ist  Chart Updates:  Recent office visits:  None  Recent consult visits:  None  Hospital visits:  None  Medications: Outpatient Encounter Medications as of 09/19/2022  Medication Sig   Accu-Chek FastClix Lancets MISC Use to check blood sugar once daily. Dx Code E11.65   acetaminophen (TYLENOL) 500 MG tablet Take 500 mg by mouth every 6 (six) hours as needed for mild pain. Pain    Alcohol Swabs PADS Use as directed   Blood Glucose Calibration (TRUE METRIX LEVEL 1) Low SOLN Use as directed   Blood Glucose Monitoring Suppl (ACCU-CHEK NANO SMARTVIEW) w/Device KIT Use to check blood glucose 2 times daily.   Blood Glucose Monitoring Suppl (TRUE METRIX METER) DEVI Use as directed   cetirizine (ZYRTEC) 10 MG tablet Take 1 tablet (10 mg total) by mouth daily as needed. allergies   Cholecalciferol (VITAMIN D) 2000 UNITS CAPS Take 2,000 Units by mouth. Takes only on days Monday through Thursday   furosemide (LASIX) 80 MG tablet TAKE 1 TABLET(80 MG) BY MOUTH TWICE DAILY   gabapentin (NEURONTIN) 100 MG capsule TAKE 1 TO 2 CAPSULES BY MOUTH AT NIGHT AS NEEDED FOR NERVE PAIN   glucose blood (ACCU-CHEK SMARTVIEW) test strip USE TO CHECK BLOOD GLUCOSE TWICE DAILY AS DIRECTED   losartan (COZAAR) 100 MG tablet TAKE 1 TABLET BY MOUTH DAILY   Multiple Vitamin (MULTIVITAMIN) capsule Take 1  capsule by mouth daily.   Omega-3 Fatty Acids (FISH OIL) 1200 MG CAPS Take 1,200 mg by mouth 2 (two) times daily.   polyethylene glycol powder (GLYCOLAX/MIRALAX) powder Take 17 g by mouth daily as needed. One capfull daily by mouth with liquid   senna (SENOKOT) 8.6 MG tablet Take 1 tablet by mouth daily as needed.    simvastatin (ZOCOR) 40 MG tablet TAKE 1 TABLET BY MOUTH EVERY EVENING   sodium chloride (OCEAN) 0.65 % nasal spray  Place 1-2 sprays into the nose as needed. Allergies    TRUEplus Lancets 28G MISC Use as directed   TRULICITY 3 MG/0.5ML SOPN INJECT 3MG  UNDER THE SKIN ONCE WEEKLY AS DIRECTED   No facility-administered encounter medications on file as of 09/19/2022.  Fill History:   Dispensed Days Supply Quantity Provider Pharmacy  TRULICITY 3 MG/0.5 ML PEN 01/21/2022 28  Burchette, Elberta Fortis, MD [facility]...  TRULICITY 3MG /0.5ML SDP 0.5ML 12/11/2021 28 2 mL      Dispensed Days Supply Quantity Provider Pharmacy  FUROSEMIDE 80MG  TABLETS 07/07/2022 90 180 each      Dispensed Days Supply Quantity Provider Pharmacy  GABAPENTIN 100MG  CAPSULES 06/13/2022 90 180 each      Dispensed Days Supply Quantity Provider Pharmacy  LOSARTAN 100MG  TABLETS 07/21/2022 90 90 each      Dispensed Days Supply Quantity Provider Pharmacy  SIMVASTATIN 40MG  TABLETS 07/07/2022 90 90 each      Recent Relevant Labs: Lab Results  Component Value Date/Time   HGBA1C 7.6 (H) 06/20/2022 11:30 AM   HGBA1C 9.5 (H) 11/06/2021 07:28 AM   MICROALBUR 84.1 (H) 12/31/2010 09:03 AM    Kidney Function Lab Results  Component Value Date/Time   CREATININE 1.63 (H) 06/20/2022 11:30 AM   CREATININE 2.32 (H) 05/01/2022 04:03 PM   GFR 30.11 (L) 06/20/2022 11:30 AM   GFRNONAA 21 (L) 05/01/2022 04:03 PM   GFRAA 43 (L) 01/08/2016 09:38 PM   Inetta Fermo CMA  Clinical Pharmacist Assistant [phone]

## 2022-10-03 DIAGNOSIS — Z936 Other artificial openings of urinary tract status: Secondary | ICD-10-CM | POA: Diagnosis not present

## 2022-10-03 DIAGNOSIS — G809 Cerebral palsy, unspecified: Secondary | ICD-10-CM | POA: Diagnosis not present

## 2022-10-03 DIAGNOSIS — G834 Cauda equina syndrome: Secondary | ICD-10-CM | POA: Diagnosis not present

## 2022-10-09 ENCOUNTER — Other Ambulatory Visit: Payer: Self-pay | Admitting: Family Medicine

## 2022-10-22 ENCOUNTER — Other Ambulatory Visit: Payer: Self-pay | Admitting: Family Medicine

## 2022-10-22 DIAGNOSIS — I10 Essential (primary) hypertension: Secondary | ICD-10-CM

## 2022-11-04 DIAGNOSIS — G834 Cauda equina syndrome: Secondary | ICD-10-CM | POA: Diagnosis not present

## 2022-11-04 DIAGNOSIS — G809 Cerebral palsy, unspecified: Secondary | ICD-10-CM | POA: Diagnosis not present

## 2022-11-04 DIAGNOSIS — Z936 Other artificial openings of urinary tract status: Secondary | ICD-10-CM | POA: Diagnosis not present

## 2022-11-12 NOTE — Progress Notes (Unsigned)
Care Management & Coordination Services Pharmacy Note  11/12/2022 Name:  Jordan [name] MRN:  [id number] DOB:  May 22, 1946  Summary: BP at goal <140/90 A1C elevated >7.0 LDL not at goal <70  Recommendations/Changes made from today's visit: -Check BP once weekly at home and keep a log. -Check sugars at least once daily and keep a log. Be mindful of carb intake in food -Limit fried, fatty foods high in cholesterol in diet  Follow up plan: DM call in 1 week Pharmacist visit in 3 months   Subjective: Jordan [name] is an 77 y.o. year old female who is a primary patient of Burchette, Elberta Fortis, MD.  The care coordination team was consulted for assistance with disease management and care coordination needs.    Engaged with patient by telephone for follow up visit.  Recent office visits: 06/20/22 Kristian Covey, MD - For open skin wound and follow up. START doxycycline 100mg  BID, referral to urology 04/15/22 Barb Merino, LPN - For AWV  Recent consult visits: 07/21/22 Lucius Conn, FNP - Urostomy f/u. New supplies provided 07/14/22 Lucius Conn, FNP - Urostomy f/u. New supplies provided 07/07/22 Lucius Conn, FNP - Urostomy f/u. New supplies provided 06/30/22 Lucius Conn, FNP - New pt visit for osotomy clinic at [facility] visits: 05/01/22 Glynn Octave, MD - For urostomy complication. Discharged same day. START keflex 500mg  TID for possible UTI and skin infection at urostomy site   Objective:  Lab Results  Component Value Date   CREATININE 1.63 (H) 06/20/2022   BUN 38 (H) 06/20/2022   GFR 30.11 (L) 06/20/2022   GFRNONAA 21 (L) 05/01/2022   GFRAA 43 (L) 01/08/2016   NA 139 06/20/2022   K 4.6 06/20/2022   CALCIUM 10.0 06/20/2022   CO2 22 06/20/2022   GLUCOSE 150 (H) 06/20/2022    Lab Results  Component Value Date/Time   HGBA1C 7.6 (H) 06/20/2022 11:30 AM   HGBA1C 9.5 (H) 11/06/2021 07:28 AM   GFR 30.11 (L) 06/20/2022 11:30 AM   GFR 23.38 (L) 11/06/2021 07:28  AM   MICROALBUR 84.1 (H) 12/31/2010 09:03 AM    Last diabetic Eye exam:  Lab Results  Component Value Date/Time   HMDIABEYEEXA No Retinopathy 07/30/2020 12:00 AM    Last diabetic Foot exam:  Lab Results  Component Value Date/Time   HMDIABFOOTEX normal 12/20/2014 12:00 AM     Lab Results  Component Value Date   CHOL 194 06/20/2022   HDL 31.50 (L) 06/20/2022   LDLCALC 60 01/26/2019   LDLDIRECT 128.0 06/20/2022   TRIG 232.0 (H) 06/20/2022   CHOLHDL 6 06/20/2022       Latest Ref Rng & Units 06/20/2022   11:30 AM 05/01/2022    4:03 PM 11/06/2021    7:28 AM  Hepatic Function  Total Protein 6.0 - 8.3 g/dL 7.4  7.6  7.4   Albumin 3.5 - 5.2 g/dL 4.0  3.9  3.9   AST 0 - 37 U/L 11  9  10    ALT 0 - 35 U/L 10  11  15    Alk Phosphatase 39 - 117 U/L 95  103  101   Total Bilirubin 0.2 - 1.2 mg/dL 0.5  0.3  0.5   Bilirubin, Direct 0.0 - 0.3 mg/dL   0.1     Lab Results  Component Value Date/Time   TSH 1.130 01/26/2019 03:45 PM   TSH 1.27 01/04/2016 11:20 AM   FREET4 1.39 01/26/2019 03:45 PM  Latest Ref Rng & Units 06/20/2022   11:30 AM 05/01/2022    4:03 PM 11/06/2021    7:28 AM  CBC  WBC 4.0 - 10.5 K/uL 8.1  10.1  6.9   Hemoglobin 12.0 - 15.0 g/dL 91.4  78.2  95.6   Hematocrit 36.0 - 46.0 % 35.7  33.6  34.1   Platelets 150.0 - 400.0 K/uL 406.0  423  373.0     Lab Results  Component Value Date/Time   VITAMINB12 231 (L) 01/26/2019 03:45 PM    Clinical ASCVD: No  The ASCVD Risk score (Arnett DK, et al., 2019) failed to calculate for the following reasons:   The patient has a prior MI or stroke diagnosis         04/15/2022    9:41 AM 04/03/2021    9:41 AM 02/28/2020    9:57 AM  Depression screen PHQ 2/9  Decreased Interest 0 0 0  Down, Depressed, Hopeless 0 0 0  PHQ - 2 Score 0 0 0  Altered sleeping   0  Tired, decreased energy   0  Change in appetite   0  Feeling bad or failure about yourself    0  Trouble concentrating   0  Moving slowly or  fidgety/restless   0  Suicidal thoughts   0  PHQ-9 Score   0  Difficult doing work/chores   Not difficult at all     Social History   Tobacco Use  Smoking Status Never  Smokeless Tobacco Never   BP Readings from Last 3 Encounters:  07/21/22 (!) 137/47  07/14/22 (!) 139/53  07/07/22 (!) 141/57   Pulse Readings from Last 3 Encounters:  07/21/22 70  07/14/22 62  07/07/22 68   Wt Readings from Last 3 Encounters:  05/01/22 195 lb (88.5 kg)  04/15/22 200 lb (90.7 kg)  11/06/21 198 lb (89.8 kg)   BMI Readings from Last 3 Encounters:  06/20/22 28.80 kg/m  05/01/22 28.80 kg/m  04/15/22 29.53 kg/m    Allergies  Allergen Reactions   Morphine Shortness Of Breath   Onglyza [Saxagliptin] Other (See Comments)    Headache within 45 minutes of taking this.   Baclofen     GI upset   Lisinopril Cough   Metoclopramide Hcl     Reglan, irritable   Penicillins     " MAJOR CASE OF HIVES"    Medications Reviewed Today     Reviewed by Sherrill Raring, RPH (Pharmacist) on 08/12/22 at 1133  Med List Status: <None>   Medication Order Taking? Sig Documenting Provider Last Dose Status Informant  Accu-Chek FastClix Lancets MISC [id number] No Use to check blood sugar once daily. Dx Code I69.62 Kristian Covey, MD Taking Active   acetaminophen (TYLENOL) 500 MG tablet [id number] No Take 500 mg by mouth every 6 (six) hours as needed for mild pain. Pain  [provider] Taking Active Self  Alcohol Swabs PADS [id number] No Use as directed Burchette, Elberta Fortis, MD Taking Active   Blood Glucose Calibration (TRUE METRIX LEVEL 1) Low SOLN [id number] No Use as directed Kristian Covey, MD Taking Active   Blood Glucose Monitoring Suppl (ACCU-CHEK NANO SMARTVIEW) w/Device KIT [id number] No Use to check blood glucose 2 times daily. Kristian Covey, MD Taking Active   Blood Glucose Monitoring Suppl (TRUE METRIX METER) DEVI [id number] No Use as directed Kristian Covey, MD Taking Active    cetirizine (ZYRTEC) 10 MG tablet [id number] No Take 1 tablet (10  mg total) by mouth daily as needed. allergies Burchette, Elberta Fortis, MD Taking Active   Cholecalciferol (VITAMIN D) 2000 UNITS CAPS [id number] No Take 2,000 Units by mouth. Takes only on days Monday through Thursday [provider] Taking Active Self  furosemide (LASIX) 80 MG tablet [id number]  TAKE 1 TABLET(80 MG) BY MOUTH TWICE DAILY Burchette, Elberta Fortis, MD  Active   gabapentin (NEURONTIN) 100 MG capsule [id number] No TAKE 1 TO 2 CAPSULES BY MOUTH AT NIGHT AS NEEDED FOR NERVE PAIN Kristian Covey, MD Taking Active   glucose blood (ACCU-CHEK SMARTVIEW) test strip [id number] No USE TO CHECK BLOOD GLUCOSE TWICE DAILY AS DIRECTED Burchette, Elberta Fortis, MD Taking Active   losartan (COZAAR) 100 MG tablet [id number]  TAKE 1 TABLET BY MOUTH DAILY Burchette, Elberta Fortis, MD  Active   Multiple Vitamin (MULTIVITAMIN) capsule [id number] No Take 1 capsule by mouth daily. [provider] Taking Active Self  Omega-3 Fatty Acids (FISH OIL) 1200 MG CAPS [id number] No Take 1,200 mg by mouth 2 (two) times daily. [provider] Taking Active Self  polyethylene glycol powder (GLYCOLAX/MIRALAX) powder [id number] No Take 17 g by mouth daily as needed. One capfull daily by mouth with liquid [provider] Taking Active Self  senna (SENOKOT) 8.6 MG tablet [id number] No Take 1 tablet by mouth daily as needed.  [provider] Taking Active   simvastatin (ZOCOR) 40 MG tablet [id number]  TAKE 1 TABLET BY MOUTH EVERY EVENING Burchette, Elberta Fortis, MD  Active   sodium chloride (OCEAN) 0.65 % nasal spray [id number] No Place 1-2 sprays into the nose as needed. Allergies  [provider] Taking Active Self  TRUEplus Lancets 28G MISC [id number] No Use as directed Kristian Covey, MD Taking Active   TRULICITY 3 MG/0.5ML Namon Cirri [id number] No INJECT 3MG  UNDER THE SKIN ONCE WEEKLY AS DIRECTED Burchette, Elberta Fortis, MD Taking Active              SDOH:  (Social Determinants of Health) assessments and interventions performed: Yes SDOH Interventions    Flowsheet Row Care Coordination from 08/12/2022 in [facility] Clinical Support from 04/15/2022 in [facility] [place] Clinical Support from 02/28/2020 in [facility] [place] Chronic Care Management from 12/01/2019 in [facility] [place]  SDOH Interventions      Food Insecurity Interventions Intervention Not Indicated Intervention Not Indicated Intervention Not Indicated --  Housing Interventions Intervention Not Indicated -- Intervention Not Indicated --  Transportation Interventions -- Intervention Not Indicated Intervention Not Indicated Intervention Not Indicated  Utilities Interventions Intervention Not Indicated -- -- --  Depression Interventions/Treatment  -- -- PHQ2-9 Score <4 Follow-up Not Indicated --  Financial Strain Interventions -- Intervention Not Indicated Intervention Not Indicated Intervention Not Indicated  Physical Activity Interventions -- Intervention Not Indicated Intervention Not Indicated --  Stress Interventions -- Intervention Not Indicated Intervention Not Indicated --  Social Connections Interventions -- -- Intervention Not Indicated --       Medication Assistance: None required.  Patient affirms current coverage meets needs.  Medication Access: Within the past 30 days, how often has patient missed a dose of medication? None Is a pillbox or other method used to improve adherence? Yes  Factors that may affect medication adherence? no barriers identified Are meds synced by current pharmacy? No  Are meds delivered by current pharmacy? No  Does patient experience delays in picking up medications due to transportation concerns? No   Name and  location of Current pharmacy:  [id number] - [place], [place] - [address] AT [facility] [address] [facility]  [place] [address] [place] [address] Phone: [phone] Fax: [phone]   Compliance/Adherence/Medication fill history: Care Gaps: Urine microalbumin Diabetic eye exam/foot exam Vaccine: Shingles, Covid  Star-Rating Drugs: Losartan 100mg  PDC 66% Simvastatin 40mg  PDC 95%  Assessment/Plan   Hypertension (BP goal <130/80) -Controlled -Current treatment: Losartan 100mg  qd Appropriate, Effective, Safe, Accessible Lasix 80mg  BID Appropriate, Effective, Safe, Accessible -Medications previously tried: None  -Current home readings: Does not have a cuff and has not been checking at home -Current dietary habits: Is mindful of salt intake -Current exercise habits: Not discussed -Denies hypotensive/hypertensive symptoms -Educated on BP goals and benefits of medications for prevention of heart attack, stroke and kidney damage; Daily salt intake goal < 2300 mg; Importance of home blood pressure monitoring; -Counseled to monitor BP at home once weekly once new cuff is obtained, document, and provide log at future appointments -Recommended to continue current medication Recommended a new cuff to resume home monitoring  Hyperlipidemia: (LDL goal < 70) -Uncontrolled -Current treatment: Simvastatin 40mg  1 qd Appropriate, Query Effective -Medications previously tried: None  -Current dietary patterns: not discussed -Current exercise habits: not discussed -Educated on Cholesterol goals;  Benefits of statin for ASCVD risk reduction; Importance of limiting foods high in cholesterol; -Counseled on diet and exercise extensively Recommended to continue current medication -If LDL still elevated at next check, will need to consider switch to moderate/high intensity statin/addition of zetia  Diabetes (A1c goal <7%) -Uncontrolled -Current medications: Trulicity 3mg  once weekly Appropriate, Query Effective -Medications previously tried: Metformin (gfr excludes use), Januvia  (headaches) -Current home glucose readings - checking once daily fasting glucose: 120 yesterday, no log kept -Denies hypoglycemic/hyperglycemic symptoms -Current meal patterns:  drinks: water or unsweet tea -Educated on A1c and blood sugar goals; Complications of diabetes including kidney damage, retinal damage, and cardiovascular disease; Benefits of routine self-monitoring of blood sugar; -Counseled to check feet daily and get yearly eye exams -Recommended to continue current medication Counseled on keeping a log of sugars  Sherrill Raring Clinical Pharmacist [phone]

## 2022-11-13 ENCOUNTER — Telehealth: Payer: Self-pay

## 2022-11-13 NOTE — Progress Notes (Signed)
Care Management & Coordination Services Pharmacy Team  Reason for Encounter: Appointment Reminder  Contacted patient to confirm telephone appointment with Delano Metz, PharmD on 11/14/2022 at 3:00. Unsuccessful outreach. Left voicemail for patient to return call.  Care Gaps: AWV - completed 04/15/2022 Last eye exam - 07/30/2020 Last  foot exam - 07/30/2020 Shingrix - never done Urine ACR - overdue Covid - overdue   Star Rating Drug: Losartan 100 mg - last filled 07/27/2022 90 DS at [facility] verified Simvastatin 40 mg - last filled 10/10/2022 90 DS at [facility] Trulicity 3mg /0.5 ml - last filled 12/11/2021 28 DS at [facility] verified   Inetta Fermo Avala  Clinical Pharmacist Assistant [phone]

## 2022-12-01 DIAGNOSIS — Z936 Other artificial openings of urinary tract status: Secondary | ICD-10-CM | POA: Diagnosis not present

## 2022-12-01 DIAGNOSIS — G834 Cauda equina syndrome: Secondary | ICD-10-CM | POA: Diagnosis not present

## 2022-12-01 DIAGNOSIS — G809 Cerebral palsy, unspecified: Secondary | ICD-10-CM | POA: Diagnosis not present

## 2022-12-05 ENCOUNTER — Encounter (HOSPITAL_BASED_OUTPATIENT_CLINIC_OR_DEPARTMENT_OTHER): Payer: Self-pay | Admitting: Emergency Medicine

## 2022-12-05 ENCOUNTER — Emergency Department (HOSPITAL_BASED_OUTPATIENT_CLINIC_OR_DEPARTMENT_OTHER)
Admission: EM | Admit: 2022-12-05 | Discharge: 2022-12-05 | Disposition: A | Discharge status: Home / Self Care | Payer: Medicare HMO | Attending: Emergency Medicine | Admitting: Emergency Medicine

## 2022-12-05 ENCOUNTER — Other Ambulatory Visit: Payer: Self-pay

## 2022-12-05 ENCOUNTER — Emergency Department (HOSPITAL_BASED_OUTPATIENT_CLINIC_OR_DEPARTMENT_OTHER): Payer: Medicare HMO | Admitting: Radiology

## 2022-12-05 ENCOUNTER — Other Ambulatory Visit (HOSPITAL_BASED_OUTPATIENT_CLINIC_OR_DEPARTMENT_OTHER): Payer: Self-pay

## 2022-12-05 DIAGNOSIS — J9811 Atelectasis: Secondary | ICD-10-CM | POA: Diagnosis not present

## 2022-12-05 DIAGNOSIS — R002 Palpitations: Secondary | ICD-10-CM | POA: Diagnosis not present

## 2022-12-05 LAB — BASIC METABOLIC PANEL
Anion gap: 12 (ref 5–15)
BUN: 69 mg/dL — ABNORMAL HIGH (ref 8–23)
CO2: 21 mmol/L — ABNORMAL LOW (ref 22–32)
Calcium: 9.3 mg/dL (ref 8.9–10.3)
Chloride: 105 mmol/L (ref 98–111)
Creatinine, Ser: 1.9 mg/dL — ABNORMAL HIGH (ref 0.44–1.00)
GFR, Estimated: 27 mL/min — ABNORMAL LOW (ref >60)
Glucose, Bld: 163 mg/dL — ABNORMAL HIGH (ref 70–99)
Potassium: 3.8 mmol/L (ref 3.5–5.1)
Sodium: 138 mmol/L (ref 135–145)

## 2022-12-05 LAB — CBC
HCT: 36.1 % (ref 36.0–46.0)
Hemoglobin: 11.3 g/dL — ABNORMAL LOW (ref 12.0–15.0)
MCH: 27 pg (ref 26.0–34.0)
MCHC: 31.3 g/dL (ref 30.0–36.0)
MCV: 86.4 fL (ref 80.0–100.0)
Platelets: 339 10*3/uL (ref 150–400)
RBC: 4.18 MIL/uL (ref 3.87–5.11)
RDW: 15.8 % — ABNORMAL HIGH (ref 11.5–15.5)
WBC: 6.8 10*3/uL (ref 4.0–10.5)
nRBC: 0 % (ref 0.0–0.2)

## 2022-12-05 LAB — TSH: TSH: 1.566 u[IU]/mL (ref 0.350–4.500)

## 2022-12-05 LAB — TROPONIN I (HIGH SENSITIVITY): Troponin I (High Sensitivity): 5 ng/L (ref <18)

## 2022-12-05 NOTE — ED Notes (Signed)
Patient transported to X-ray 

## 2022-12-05 NOTE — ED Triage Notes (Signed)
Pt arrives to ED with c/o palpitations that started last night.

## 2022-12-05 NOTE — Discharge Instructions (Addendum)
You were evaluated today for palpitations. Your workup was reassuring but does not explain your symptoms. I have placed a referral with cardiology for outpatient follow up. If you do not hear from them you may call them to schedule the appointment. If you develop any life threatening symptoms you should return the emergency department.

## 2022-12-05 NOTE — ED Provider Notes (Signed)
[facility] AT [facility] Provider Note   CSN: [id number] Arrival date & time: 12/05/22  1231     History  Chief Complaint  Patient presents with   Palpitations    Jordan [name] is a 77 y.o. female.  Patient presents to the emergency department complaining of heart palpitations.  She states that the symptoms began earlier this morning at approximately 6 or 7 AM.  She feels that her chest is "fluttering".  She denies chest pain, shortness of breath, abdominal pain, nausea, vomiting at this time.  She denies any history of similar symptoms.  Past medical history significant for type II DM, hypertension, history of total cystectomy, cerebral palsy  HPI     Home Medications Prior to Admission medications   Medication Sig Start Date End Date Taking? Authorizing Provider  Accu-Chek FastClix Lancets MISC Use to check blood sugar once daily. Dx Code E11.65 06/01/19   Burchette, Elberta Fortis, MD  acetaminophen (TYLENOL) 500 MG tablet Take 500 mg by mouth every 6 (six) hours as needed for mild pain. Pain     [provider]  Alcohol Swabs PADS Use as directed 05/31/20   Burchette, Elberta Fortis, MD  Blood Glucose Calibration (TRUE METRIX LEVEL 1) Low SOLN Use as directed 05/31/20   Burchette, Elberta Fortis, MD  Blood Glucose Monitoring Suppl (ACCU-CHEK NANO SMARTVIEW) w/Device KIT Use to check blood glucose 2 times daily. 06/21/19   Burchette, Elberta Fortis, MD  Blood Glucose Monitoring Suppl (TRUE METRIX METER) DEVI Use as directed 05/31/20   Burchette, Elberta Fortis, MD  cetirizine (ZYRTEC) 10 MG tablet Take 1 tablet (10 mg total) by mouth daily as needed. allergies 07/26/15   Burchette, Elberta Fortis, MD  Cholecalciferol (VITAMIN D) 2000 UNITS CAPS Take 2,000 Units by mouth. Takes only on days Monday through Thursday    [provider]  furosemide (LASIX) 80 MG tablet TAKE 1 TABLET(80 MG) BY MOUTH TWICE DAILY 10/10/22   Burchette, Elberta Fortis, MD  gabapentin (NEURONTIN) 100 MG capsule  TAKE 1 TO 2 CAPSULES BY MOUTH AT NIGHT AS NEEDED FOR NERVE PAIN 09/16/22   Burchette, Elberta Fortis, MD  glucose blood (ACCU-CHEK SMARTVIEW) test strip USE TO CHECK BLOOD GLUCOSE TWICE DAILY AS DIRECTED 05/31/18   Burchette, Elberta Fortis, MD  losartan (COZAAR) 100 MG tablet TAKE 1 TABLET BY MOUTH DAILY 10/23/22   Burchette, Elberta Fortis, MD  Multiple Vitamin (MULTIVITAMIN) capsule Take 1 capsule by mouth daily.    [provider]  Omega-3 Fatty Acids (FISH OIL) 1200 MG CAPS Take 1,200 mg by mouth 2 (two) times daily.    [provider]  polyethylene glycol powder (GLYCOLAX/MIRALAX) powder Take 17 g by mouth daily as needed. One capfull daily by mouth with liquid    [provider]  senna (SENOKOT) 8.6 MG tablet Take 1 tablet by mouth daily as needed.     [provider]  simvastatin (ZOCOR) 40 MG tablet TAKE 1 TABLET BY MOUTH EVERY EVENING 07/07/22   Burchette, Elberta Fortis, MD  sodium chloride (OCEAN) 0.65 % nasal spray Place 1-2 sprays into the nose as needed. Allergies     [provider]  TRUEplus Lancets 28G MISC Use as directed 05/31/20   Burchette, Elberta Fortis, MD  TRULICITY 3 MG/0.5ML SOPN INJECT 3MG  UNDER THE SKIN ONCE WEEKLY AS DIRECTED 01/21/22   Burchette, Elberta Fortis, MD      Allergies    Morphine, Onglyza [saxagliptin], Baclofen, Lisinopril, Metoclopramide hcl, and Penicillins  Review of Systems   Review of Systems  Physical Exam Updated Vital Signs BP (!) 152/58 (BP Location: Left Arm)   Pulse 70   Temp 98.3 F (36.8 C) (Oral)   Resp 15   Ht 5\' 9"  (1.753 m)   Wt 90.7 kg   SpO2 94%   BMI 29.53 kg/m  Physical Exam Vitals and nursing note reviewed.  Constitutional:      General: She is not in acute distress.    Appearance: She is well-developed.  HENT:     Head: Normocephalic and atraumatic.  Eyes:     Conjunctiva/sclera: Conjunctivae normal.  Cardiovascular:     Rate and Rhythm: Normal rate and regular rhythm.     Heart sounds: No murmur  heard. Pulmonary:     Effort: Pulmonary effort is normal. No respiratory distress.     Breath sounds: Normal breath sounds.  Abdominal:     Palpations: Abdomen is soft.     Tenderness: There is no abdominal tenderness.     Comments: Urostomy in place  Musculoskeletal:        General: No swelling.     Cervical back: Neck supple.  Skin:    General: Skin is warm and dry.     Capillary Refill: Capillary refill takes less than 2 seconds.  Neurological:     Mental Status: She is alert.  Psychiatric:        Mood and Affect: Mood normal.     ED Results / Procedures / Treatments   Labs (all labs ordered are listed, but only abnormal results are displayed) Labs Reviewed  CBC - Abnormal; Notable for the following components:      Result Value   Hemoglobin 11.3 (*)    RDW 15.8 (*)    All other components within normal limits  BASIC METABOLIC PANEL - Abnormal; Notable for the following components:   CO2 21 (*)    Glucose, Bld 163 (*)    BUN 69 (*)    Creatinine, Ser 1.90 (*)    GFR, Estimated 27 (*)    All other components within normal limits  TSH  TROPONIN I (HIGH SENSITIVITY)    EKG None  Radiology DG Chest 2 View  Result Date: 12/05/2022 CLINICAL DATA:  Palpitations EXAM: CHEST - 2 VIEW COMPARISON:  X-ray 06/29/2014 FINDINGS: No consolidation, pneumothorax or effusion. No edema. Normal cardiopericardial silhouette. Overlapping cardiac leads. Degenerative changes are seen along the shoulders. Minimal linear opacity left lung base. Favor atelectasis. IMPRESSION: Minimal left basilar atelectasis. Electronically Signed   By: Karen Kays M.D.   On: 12/05/2022 13:50    Procedures Procedures    Medications Ordered in ED Medications - No data to display  ED Course/ Medical Decision Making/ A&P                             Medical Decision Making Amount and/or Complexity of Data Reviewed Labs: ordered. Radiology: ordered.   This patient presents to the ED for concern  of palpitations, this involves an extensive number of treatment options, and is a complaint that carries with it a high risk of complications and morbidity.  The differential diagnosis includes arrhythmia, ACS, CHF, anxiety, thyroid storm, others   Co morbidities that complicate the patient evaluation  CP, type II DM   Additional history obtained:  Additional history obtained from visitor at bedside External records from outside source obtained and reviewed including family medicine notes  Lab Tests:  I Ordered, and personally interpreted labs.  The pertinent results include: CBC and BMP roughly at baseline, troponin 5, TSH   Imaging Studies ordered:  I ordered imaging studies including chest x-ray I independently visualized and interpreted imaging which showed minimal left basilar atelectasis I agree with the radiologist interpretation   Cardiac Monitoring: / EKG:  The patient was maintained on a cardiac monitor.  I personally viewed and interpreted the cardiac monitored which showed an underlying rhythm of: Sinus rhythm with occasional PVCs   Social Determinants of Health:  Patient reportedly insufficiently active   Test / Admission - Considered:  Patient with palpitations of unclear etiology.  EKG shows sinus rhythm with occasional PVCs, very sporadic.  Initial troponin 5.  No chest pain or shortness of breath to suggest PE at this time.  Patient's TSH does not suggest thyroid storm.  Plan to discharge at this time with return precautions and plans for outpatient cardiology workup.  Patient voices understanding with plan.  Discharge home.         Final Clinical Impression(s) / ED Diagnoses Final diagnoses:  Palpitations    Rx / DC Orders ED Discharge Orders          Ordered    Ambulatory referral to Cardiology       Comments: If you have not heard from the Cardiology office within the next 72 hours please call [phone].   12/05/22 1432               Darrick Grinder, PA-C 12/05/22 1442    Blane Ohara, MD 12/05/22 1600

## 2022-12-08 ENCOUNTER — Telehealth: Payer: Self-pay | Admitting: Family Medicine

## 2022-12-08 NOTE — Telephone Encounter (Signed)
Pt is calling and has checked with [facility] and [facility] and they do not have TRULICITY 3 MG/0.5ML SOPN in stock please advise

## 2022-12-09 NOTE — Telephone Encounter (Signed)
Left a detailed message on vm informing patient of message below.

## 2022-12-10 NOTE — Telephone Encounter (Signed)
Pt called to say she is still having issues getting her Trulicity and is wondering   if MD needs to change the dosage or prescribe something else?  Please advise.

## 2022-12-10 NOTE — Telephone Encounter (Signed)
I spoke with the patient and she reported that her pharmacy did not have Trulicity 1.5 mg in stock and inquired if an alternative could be sent.

## 2022-12-11 ENCOUNTER — Telehealth: Payer: Self-pay | Admitting: Family Medicine

## 2022-12-11 DIAGNOSIS — Z9889 Other specified postprocedural states: Secondary | ICD-10-CM

## 2022-12-11 MED ORDER — OZEMPIC (0.25 OR 0.5 MG/DOSE) 2 MG/3ML ~~LOC~~ SOPN: PEN_INJECTOR | SUBCUTANEOUS | 0 refills | Status: DC

## 2022-12-11 NOTE — Telephone Encounter (Signed)
Spouse called to F/U, stating new request must go out today.

## 2022-12-11 NOTE — Telephone Encounter (Signed)
Rx sent and left message for patient to return my call.  

## 2022-12-11 NOTE — Telephone Encounter (Signed)
Insurance changed, needs new request for  [employer] 1/8 inch urostomy pouches sent to [facility] [phone]

## 2022-12-11 NOTE — Addendum Note (Signed)
Addended by: Christy Sartorius on: 12/11/2022 08:36 AM   Modules accepted: Orders

## 2022-12-11 NOTE — Telephone Encounter (Signed)
Patient informed rx was sent  

## 2022-12-12 NOTE — Telephone Encounter (Signed)
Spoke with Joy at [facility] and she provided fax number for order and this has been faxed. Joy stated clinical information will need to be attached and this was done.

## 2022-12-12 NOTE — Telephone Encounter (Signed)
DME order created and faxed to [facility]

## 2022-12-12 NOTE — Addendum Note (Signed)
Addended by: Christy Sartorius on: 12/12/2022 01:15 PM   Modules accepted: Orders

## 2022-12-13 ENCOUNTER — Encounter (HOSPITAL_COMMUNITY): Payer: Self-pay | Admitting: Emergency Medicine

## 2022-12-13 ENCOUNTER — Emergency Department (HOSPITAL_COMMUNITY)
Admission: EM | Admit: 2022-12-13 | Discharge: 2022-12-13 | Disposition: A | Discharge status: Home / Self Care | Payer: Medicare HMO | Attending: Emergency Medicine | Admitting: Emergency Medicine

## 2022-12-13 ENCOUNTER — Other Ambulatory Visit: Payer: Self-pay

## 2022-12-13 DIAGNOSIS — R238 Other skin changes: Secondary | ICD-10-CM | POA: Diagnosis not present

## 2022-12-13 DIAGNOSIS — I10 Essential (primary) hypertension: Secondary | ICD-10-CM | POA: Diagnosis not present

## 2022-12-13 DIAGNOSIS — Z7401 Bed confinement status: Secondary | ICD-10-CM | POA: Diagnosis not present

## 2022-12-13 DIAGNOSIS — R239 Unspecified skin changes: Secondary | ICD-10-CM | POA: Diagnosis not present

## 2022-12-13 DIAGNOSIS — S31109A Unspecified open wound of abdominal wall, unspecified quadrant without penetration into peritoneal cavity, initial encounter: Secondary | ICD-10-CM | POA: Diagnosis not present

## 2022-12-13 DIAGNOSIS — Z4801 Encounter for change or removal of surgical wound dressing: Secondary | ICD-10-CM | POA: Diagnosis present

## 2022-12-13 DIAGNOSIS — R739 Hyperglycemia, unspecified: Secondary | ICD-10-CM | POA: Diagnosis not present

## 2022-12-13 NOTE — ED Notes (Signed)
Ptar has been contacted

## 2022-12-13 NOTE — ED Provider Notes (Signed)
[facility] AT [facility] Provider Note   CSN: [id number] Arrival date & time: 12/13/22  1544     History  Chief Complaint  Patient presents with   Wound Check    Jordan [name] is a 77 y.o. female.  Patient is 77 year old female who presents with a wound above her stoma.  She has a history of cerebral palsy with a neurogenic bladder.  She has an ostomy site on her abdomen.  She said that over the last week she has developed a wound above her ostomy site.  She is having a hard time attaching the pouch because of that.  On chart review, I do note that she had a similar type wound in February and was going to the ostomy wound clinic for dressing changes.  She denies any fevers.  No tenderness to her abdomen other than at the site of the sores.  No significant drainage.       Home Medications Prior to Admission medications   Medication Sig Start Date End Date Taking? Authorizing Provider  Accu-Chek FastClix Lancets MISC Use to check blood sugar once daily. Dx Code E11.65 06/01/19   Burchette, Elberta Fortis, MD  acetaminophen (TYLENOL) 500 MG tablet Take 500 mg by mouth every 6 (six) hours as needed for mild pain. Pain     [provider]  Alcohol Swabs PADS Use as directed 05/31/20   Burchette, Elberta Fortis, MD  Blood Glucose Calibration (TRUE METRIX LEVEL 1) Low SOLN Use as directed 05/31/20   Burchette, Elberta Fortis, MD  Blood Glucose Monitoring Suppl (ACCU-CHEK NANO SMARTVIEW) w/Device KIT Use to check blood glucose 2 times daily. 06/21/19   Burchette, Elberta Fortis, MD  Blood Glucose Monitoring Suppl (TRUE METRIX METER) DEVI Use as directed 05/31/20   Burchette, Elberta Fortis, MD  cetirizine (ZYRTEC) 10 MG tablet Take 1 tablet (10 mg total) by mouth daily as needed. allergies 07/26/15   Burchette, Elberta Fortis, MD  Cholecalciferol (VITAMIN D) 2000 UNITS CAPS Take 2,000 Units by mouth. Takes only on days Monday through Thursday    [provider]  furosemide (LASIX) 80  MG tablet TAKE 1 TABLET(80 MG) BY MOUTH TWICE DAILY 10/10/22   Burchette, Elberta Fortis, MD  gabapentin (NEURONTIN) 100 MG capsule TAKE 1 TO 2 CAPSULES BY MOUTH AT NIGHT AS NEEDED FOR NERVE PAIN 09/16/22   Burchette, Elberta Fortis, MD  glucose blood (ACCU-CHEK SMARTVIEW) test strip USE TO CHECK BLOOD GLUCOSE TWICE DAILY AS DIRECTED 05/31/18   Burchette, Elberta Fortis, MD  losartan (COZAAR) 100 MG tablet TAKE 1 TABLET BY MOUTH DAILY 10/23/22   Burchette, Elberta Fortis, MD  Multiple Vitamin (MULTIVITAMIN) capsule Take 1 capsule by mouth daily.    [provider]  Omega-3 Fatty Acids (FISH OIL) 1200 MG CAPS Take 1,200 mg by mouth 2 (two) times daily.    [provider]  polyethylene glycol powder (GLYCOLAX/MIRALAX) powder Take 17 g by mouth daily as needed. One capfull daily by mouth with liquid    [provider]  Semaglutide,0.25 or 0.5MG /DOS, (OZEMPIC, 0.25 OR 0.5 MG/DOSE,) 2 MG/3ML SOPN Inject 0.5 mg subcutaneous once weekly 12/11/22   Burchette, Elberta Fortis, MD  senna (SENOKOT) 8.6 MG tablet Take 1 tablet by mouth daily as needed.     [provider]  simvastatin (ZOCOR) 40 MG tablet TAKE 1 TABLET BY MOUTH EVERY EVENING 07/07/22   Burchette, Elberta Fortis, MD  sodium chloride (OCEAN) 0.65 % nasal spray Place 1-2 sprays into the  nose as needed. Allergies     [provider]  TRUEplus Lancets 28G MISC Use as directed 05/31/20   Burchette, Elberta Fortis, MD  TRULICITY 3 MG/0.5ML SOPN INJECT 3MG  UNDER THE SKIN ONCE WEEKLY AS DIRECTED 01/21/22   Burchette, Elberta Fortis, MD      Allergies    Morphine, Onglyza [saxagliptin], Baclofen, Lisinopril, Metoclopramide hcl, and Penicillins    Review of Systems   Review of Systems  Constitutional:  Negative for fever.  Respiratory:  Negative for shortness of breath.   Gastrointestinal:  Negative for abdominal pain, nausea and vomiting.  Skin:  Positive for wound.    Physical Exam Updated Vital Signs BP (!) 147/63   Pulse 76   Temp 98 F (36.7 C) (Oral)    Resp 16   SpO2 100%  Physical Exam Constitutional:      Appearance: She is well-developed. She is obese.  HENT:     Head: Normocephalic and atraumatic.  Eyes:     Pupils: Pupils are equal, round, and reactive to light.  Cardiovascular:     Rate and Rhythm: Normal rate and regular rhythm.     Heart sounds: Normal heart sounds.  Pulmonary:     Effort: Pulmonary effort is normal. No respiratory distress.     Breath sounds: Normal breath sounds. No wheezing or rales.  Chest:     Chest wall: No tenderness.  Abdominal:     General: Bowel sounds are normal.     Palpations: Abdomen is soft.     Tenderness: There is no abdominal tenderness. There is no guarding or rebound.     Comments: Ostomy site looks well.  There is a wound above the site.  There is no significant tenderness around the site.  No drainage.  Musculoskeletal:        General: Normal range of motion.     Cervical back: Normal range of motion and neck supple.  Lymphadenopathy:     Cervical: No cervical adenopathy.  Skin:    General: Skin is warm and dry.     Findings: No rash.  Neurological:     Mental Status: She is alert and oriented to person, place, and time.     ED Results / Procedures / Treatments   Labs (all labs ordered are listed, but only abnormal results are displayed) Labs Reviewed - No data to display  EKG None  Radiology No results found.  Procedures Procedures    Medications Ordered in ED Medications - No data to display  ED Course/ Medical Decision Making/ A&P                             Medical Decision Making  Patient is a 77 year old who is developed a wound around her stoma.  It appears to be related to the sticky tape from the ostomy bag.  She had similar wound back in February although it looks a little bit worse this time.  She said previously it had healed and now is come back again.  She previously was followed at the ostomy clinic.  We were able to consult the wound nurse who  recommended special dressings including Aquacel and hydrocolloid.  A new patch was placed.  We were able to send him home with replacement dressings.  I discussed with her following back up with the ostomy clinic.  She was discharged home in good condition.  Return precautions were given. Final Clinical Impression(s) /  ED Diagnoses Final diagnoses:  Skin breakdown    Rx / DC Orders ED Discharge Orders     None         Rolan Bucco, MD 12/13/22 2113

## 2022-12-13 NOTE — ED Triage Notes (Signed)
Patient presents from home due to an open wound above her stoma site she believes may be infected. Wound makes it difficult to attach her pouch. She denies trauma to the site.   EMS vitals: 160/80 BP 80 HR 96 % SPO2 on room air 251 CBG   HX: hypertension, diabetes

## 2022-12-13 NOTE — ED Notes (Signed)
Wound care nurse Britta Mccreedy from upstairs provided dressing items and walked nurse through dressing steps myself and Nurse Montez Hageman applied dressing and new ostomy and provided education. MD made aware, discharge to follow.

## 2022-12-16 ENCOUNTER — Ambulatory Visit (HOSPITAL_COMMUNITY)
Admission: RE | Admit: 2022-12-16 | Discharge: 2022-12-16 | Disposition: A | Discharge status: Home / Self Care | Payer: Medicare HMO | Source: Ambulatory Visit | Attending: Family Medicine | Admitting: Family Medicine

## 2022-12-16 DIAGNOSIS — Z432 Encounter for attention to ileostomy: Secondary | ICD-10-CM | POA: Diagnosis not present

## 2022-12-16 DIAGNOSIS — N99521 Infection of other external stoma of urinary tract: Secondary | ICD-10-CM | POA: Diagnosis not present

## 2022-12-16 DIAGNOSIS — L24B3 Irritant contact dermatitis related to fecal or urinary stoma or fistula: Secondary | ICD-10-CM | POA: Diagnosis not present

## 2022-12-16 NOTE — Progress Notes (Signed)
Jordan [name]   Reason for visit:  LUQ ileal conduit HPI:  Urostomy with peristomal breakdown 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   PT CAN TRANSFER BED TO CHAIR--CAN STAND BRIEFLY--BUT NOT ABLE TO WALK; RT LEG HYPEREXTENDS BACKWARD AND IS WEAK-HAS A POWER CHAIR   COLONIC POLYPS 07/11/2010   Constipation    Diabetes mellitus    ORAL MED FOR DIABETES   Gallbladder problem    GERD (gastroesophageal reflux disease)    PAST HISTORY GERD--WATCHES DIET - NOT ON ANY MEDS FOR GERD   HYPERLIPIDEMIA 11/29/2009   HYPERTENSION 11/29/2009   Incisional hernia    NO PAIN-NOT CAUSING ANY DISCOMFORT   Kidney infection    Osteoarthritis    Osteomyelitis (HCC)    PONV (postoperative nausea and vomiting)    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HCC)    11-28-14 remains   Shortness of breath    Stroke (cerebrum) (HCC)    Family History  Problem Relation Age of Onset   Heart disease Mother 41       COPD   Hypertension Mother    Hyperlipidemia Mother    Liver disease Mother    Alcoholism Mother    Heart disease Father        COPD    Emphysema Father    Colon cancer Father 13       Died at 33   Bladder Cancer Father    Diabetes Father    Hyperlipidemia Father    Cancer Father    Liver disease Father    Sleep apnea Father    Alcoholism Father    Colon polyps Sister    Heart disease Maternal Grandmother    Heart attack Maternal Grandfather    Esophageal cancer Neg Hx    Kidney disease Neg Hx    Allergies  Allergen Reactions   Morphine Shortness Of Breath   Onglyza [Saxagliptin] Other (See Comments)    Headache within 45 minutes of taking this.   Baclofen     GI upset   Lisinopril Cough   Metoclopramide Hcl     Reglan, irritable   Penicillins     " MAJOR CASE OF HIVES"   Current Outpatient Medications  Medication Sig  Dispense Refill Last Dose   Accu-Chek FastClix Lancets MISC Use to check blood sugar once daily. Dx Code E11.65 102 each 1    acetaminophen (TYLENOL) 500 MG tablet Take 500 mg by mouth every 6 (six) hours as needed for mild pain. Pain       Alcohol Swabs PADS Use as directed 100 each 3    Blood Glucose Calibration (TRUE METRIX LEVEL 1) Low SOLN Use as directed 1 each 1    Blood Glucose Monitoring Suppl (ACCU-CHEK NANO SMARTVIEW) w/Device KIT Use to check blood glucose 2 times daily. 1 kit 1    Blood Glucose Monitoring Suppl (TRUE METRIX METER) DEVI Use as directed 1 each 1    cetirizine (ZYRTEC) 10 MG tablet Take 1 tablet (10 mg total) by mouth daily as needed. allergies 90 tablet 1    Cholecalciferol (VITAMIN D) 2000 UNITS CAPS Take 2,000 Units by mouth. Takes only on days Monday through Thursday      furosemide (LASIX) 80 MG tablet TAKE 1 TABLET(80 MG)  BY MOUTH TWICE DAILY 180 tablet 0    gabapentin (NEURONTIN) 100 MG capsule TAKE 1 TO 2 CAPSULES BY MOUTH AT NIGHT AS NEEDED FOR NERVE PAIN 180 capsule 0    glucose blood (ACCU-CHEK SMARTVIEW) test strip USE TO CHECK BLOOD GLUCOSE TWICE DAILY AS DIRECTED 100 each 3    losartan (COZAAR) 100 MG tablet TAKE 1 TABLET BY MOUTH DAILY 90 tablet 0    Multiple Vitamin (MULTIVITAMIN) capsule Take 1 capsule by mouth daily.      Omega-3 Fatty Acids (FISH OIL) 1200 MG CAPS Take 1,200 mg by mouth 2 (two) times daily.      polyethylene glycol powder (GLYCOLAX/MIRALAX) powder Take 17 g by mouth daily as needed. One capfull daily by mouth with liquid      senna (SENOKOT) 8.6 MG tablet Take 1 tablet by mouth daily as needed.       simvastatin (ZOCOR) 40 MG tablet TAKE 1 TABLET BY MOUTH EVERY EVENING 90 tablet 1    sodium chloride (OCEAN) 0.65 % nasal spray Place 1-2 sprays into the nose as needed. Allergies       TRUEplus Lancets 28G MISC Use as directed 100 each 3    TRULICITY 3 MG/0.5ML SOPN INJECT 3MG  UNDER THE SKIN ONCE WEEKLY AS DIRECTED 2 mL 1    No  current facility-administered medications for this encounter.   ROS  Review of Systems  Gastrointestinal:        Obese abdomen, rounded LUQ urostomy  Musculoskeletal:        Cerebral palsy  wheelchair bound  Skin:  Positive for rash and wound.  Psychiatric/Behavioral: Negative.    All other systems reviewed and are negative.  Vital signs:  BP 136/60 (BP Location: Left Arm)   Pulse 65   Temp (!) 97.5 F (36.4 C) (Oral)   Resp 18   SpO2 97%  Exam:  Physical Exam Vitals reviewed.  Constitutional:      Appearance: She is obese.  Abdominal:     Palpations: Abdomen is soft.  Musculoskeletal:     Comments: Wheelchair bound  Skin:    General: Skin is warm and dry.     Findings: Lesion present.  Neurological:     Mental Status: She is alert and oriented to person, place, and time.  Psychiatric:        Mood and Affect: Mood normal.        Behavior: Behavior normal.     Stoma type/location:  LUQ urostomy Stomal assessment/size:  1 3/8" pink and moist Peristomal assessment:  2 open areas, 12 o'clock and 2 o'clock.  Not the same ones that were open a few months ago.  Treatment options for stomal/peristomal skin: aquacel to wound bed for antimicrobial coverage and absorption.  Cover with barrier ring Output: clear yellow urine Ostomy pouching: 2pc. Recruitment consultant provided:  needs assistance ordering supplies. Has run out of pouches.  I believe the open areas to be medical adhesive related skin injuries. Patient transfers self rather forcefully and the unyielding adhesive may be traumatizing skin.      Impression/dx  Contact dermatitis urostomy Discussion  Recheck wounds 1 week Plan  Given aquacel and additional pouches.  Will order from [facility]    Visit time: 55 minutes.   Maple Hudson FNP-BC

## 2022-12-16 NOTE — Discharge Instructions (Signed)
Remove old pouch and clean skin. Apply silver to open wounds and cover with barrier ring (squished together to cover)  Apply second barrier around stoma Apply pouch and secure with barrier strips along the edge Wear ostomy belt.

## 2022-12-17 ENCOUNTER — Ambulatory Visit (INDEPENDENT_AMBULATORY_CARE_PROVIDER_SITE_OTHER): Payer: Medicare HMO | Admitting: Family Medicine

## 2022-12-17 ENCOUNTER — Other Ambulatory Visit (HOSPITAL_COMMUNITY): Payer: Self-pay | Admitting: Nurse Practitioner

## 2022-12-17 ENCOUNTER — Encounter: Payer: Self-pay | Admitting: Family Medicine

## 2022-12-17 VITALS — BP 120/60 | HR 64 | Temp 97.4°F | Ht 69.0 in

## 2022-12-17 DIAGNOSIS — N1832 Chronic kidney disease, stage 3b: Secondary | ICD-10-CM | POA: Diagnosis not present

## 2022-12-17 DIAGNOSIS — E1122 Type 2 diabetes mellitus with diabetic chronic kidney disease: Secondary | ICD-10-CM

## 2022-12-17 DIAGNOSIS — Z7985 Long-term (current) use of injectable non-insulin antidiabetic drugs: Secondary | ICD-10-CM

## 2022-12-17 DIAGNOSIS — L24B3 Irritant contact dermatitis related to fecal or urinary stoma or fistula: Secondary | ICD-10-CM

## 2022-12-17 DIAGNOSIS — Z89422 Acquired absence of other left toe(s): Secondary | ICD-10-CM | POA: Diagnosis not present

## 2022-12-17 DIAGNOSIS — G834 Cauda equina syndrome: Secondary | ICD-10-CM | POA: Diagnosis not present

## 2022-12-17 DIAGNOSIS — E785 Hyperlipidemia, unspecified: Secondary | ICD-10-CM

## 2022-12-17 DIAGNOSIS — N99528 Other complication of other external stoma of urinary tract: Secondary | ICD-10-CM

## 2022-12-17 DIAGNOSIS — Z936 Other artificial openings of urinary tract status: Secondary | ICD-10-CM | POA: Diagnosis not present

## 2022-12-17 DIAGNOSIS — G809 Cerebral palsy, unspecified: Secondary | ICD-10-CM | POA: Diagnosis not present

## 2022-12-17 LAB — LIPID PANEL
Cholesterol: 163 mg/dL (ref 0–200)
HDL: 30.1 mg/dL — ABNORMAL LOW (ref >39.00)
LDL Cholesterol: 99 mg/dL (ref 0–99)
NonHDL: 132.58
Total CHOL/HDL Ratio: 5
Triglycerides: 170 mg/dL — ABNORMAL HIGH (ref 0.0–149.0)
VLDL: 34 mg/dL (ref 0.0–40.0)

## 2022-12-17 LAB — HEPATIC FUNCTION PANEL
ALT: 11 U/L (ref 0–35)
AST: 9 U/L (ref 0–37)
Albumin: 3.9 g/dL (ref 3.5–5.2)
Alkaline Phosphatase: 95 U/L (ref 39–117)
Bilirubin, Direct: 0.1 mg/dL (ref 0.0–0.3)
Total Bilirubin: 0.5 mg/dL (ref 0.2–1.2)
Total Protein: 7.3 g/dL (ref 6.0–8.3)

## 2022-12-17 LAB — MICROALBUMIN / CREATININE URINE RATIO
Creatinine,U: 60 mg/dL
Microalb Creat Ratio: 2.9 mg/g (ref 0.0–30.0)
Microalb, Ur: 1.7 mg/dL (ref 0.0–1.9)

## 2022-12-17 LAB — HEMOGLOBIN A1C: Hgb A1c MFr Bld: 8 % — ABNORMAL HIGH (ref 4.6–6.5)

## 2022-12-17 NOTE — Progress Notes (Unsigned)
Established Patient Office Visit  Subjective   Patient ID: Jordan [name], female    DOB: 1946/04/01  Age: 77 y.o. MRN: [id number]  Chief Complaint  Patient presents with   Annual Exam    HPI  {History (Optional):23778} Analyse is here today for physical exam.  Her chronic problems include history of hypertension, venous stasis lower extremities, type 2 diabetes, cerebral palsy, history of kidney stones, history of total cystectomy with chronic urostomy, hyperlipidemia, obesity.  She is predominantly wheelchair-bound.  Generally doing well.  She has had some problems with urostomy stoma wound and is followed by wound care center for that.  Has had problems with leakage in the past.  Health maintenance reviewed  -Aged out of further colonoscopies -Pneumonia vaccines complete -Prior DEXA scan 2017 was normal.  None since then. -Prior hepatitis C antibody screen negative -Tetanus due 2028 -She gets annual flu vaccine -No indication for further Pap smears  Family history and social history reviewed with no significant changes.  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   PT CAN TRANSFER BED TO CHAIR--CAN STAND BRIEFLY--BUT NOT ABLE TO WALK; RT LEG HYPEREXTENDS BACKWARD AND IS WEAK-HAS A POWER CHAIR   COLONIC POLYPS 07/11/2010   Constipation    Diabetes mellitus    ORAL MED FOR DIABETES   Gallbladder problem    GERD (gastroesophageal reflux disease)    PAST HISTORY GERD--WATCHES DIET - NOT ON ANY MEDS FOR GERD   HYPERLIPIDEMIA 11/29/2009   HYPERTENSION 11/29/2009   Incisional hernia    NO PAIN-NOT CAUSING ANY DISCOMFORT   Kidney infection    Osteoarthritis    Osteomyelitis (HCC)    PONV (postoperative nausea and vomiting)    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HCC)    11-28-14 remains   Shortness of breath    Stroke (cerebrum)  (HCC)    Past Surgical History:  Procedure Laterality Date   1992     ABDOMINAL HYSTERECTOMY  1990   partial   ACHILLES TENDON LENGTHENING  1962   ADENOIDECTOMY  1950   APPENDECTOMY     CARPAL TUNNEL RELEASE  1983   CHOLECYSTECTOMY     COLONOSCOPY WITH PROPOFOL N/A 12/07/2014   Procedure: COLONOSCOPY WITH PROPOFOL;  Surgeon: Iva Boop, MD;  Location: [facility];  Service: Endoscopy;  Laterality: N/A;   LEFT CARPAL TUNNEL RELEASE X 2     LUMBAR SYMPATHETECTOMIES X 2     MULTIPLE RIGHT ANKLE SURGERIES     NEPHROLITHOTOMY  10/01/2011   Procedure: NEPHROLITHOTOMY PERCUTANEOUS;  Surgeon: Marcine Matar, MD;  Location: [facility];  Service: Urology;  Laterality: Right;        PARTIAL AMPUTATION OF LEFT GREAT TOE     1985   radical cystectomy  1992   neurogenic bladder sec CP   REPAIR OF NASAL FRACTURE  1985   REVISION UROSTOMY CUTANEOUS  1992   RIGHT CARPAL TUNNEL RELEASE     RIGHT HIP FLEXION CONTRACTURE REPAIR  1986   RT ANKLE FUSION  1975   SEVERAL RT ANKLE SURGERIES PRIOR TO THE FUSION   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 She has never used smokeless tobacco. She reports that she does not drink alcohol and does not use drugs. family history includes Alcoholism in her father and mother; Bladder Cancer in her father; Cancer in her father; Colon cancer (age of onset: 29) in her father; Colon polyps in her sister; Diabetes in her father; Emphysema in her father; Heart attack in her maternal grandfather; Heart disease in her father and maternal grandmother; Heart disease (age of onset: 79) in her mother; Hyperlipidemia in her father and mother; Hypertension in her mother; Liver disease in her father and mother; Sleep apnea in her father. Allergies  Allergen Reactions   Morphine Shortness Of Breath   Onglyza [Saxagliptin]  Other (See Comments)    Headache within 45 minutes of taking this.   Baclofen     GI upset   Lisinopril Cough   Metoclopramide Hcl     Reglan, irritable   Penicillins     " MAJOR CASE OF HIVES"    Review of Systems  Constitutional:  Negative for chills, fever, malaise/fatigue and weight loss.  Eyes:  Negative for blurred vision.  Respiratory:  Negative for shortness of breath.   Cardiovascular:  Negative for chest pain.  Gastrointestinal:  Negative for abdominal pain.  Musculoskeletal:  Negative for joint pain.  Neurological:  Negative for dizziness, weakness and headaches.      Objective:     BP 120/60 (BP Location: Left Arm, Patient Position: Sitting, Cuff Size: Large)   Pulse 64   Temp (!) 97.4 F (36.3 C) (Oral)   Ht 5\' 9"  (1.753 m)   SpO2 98%   BMI 29.53 kg/m  BP Readings from Last 3 Encounters:  12/17/22 120/60  12/16/22 136/60  12/13/22 (!) 147/63   Wt Readings from Last 3 Encounters:  12/05/22 200 lb (90.7 kg)  05/01/22 195 lb (88.5 kg)  04/15/22 200 lb (90.7 kg)      Physical Exam Vitals reviewed.  Constitutional:      General: She is not in acute distress.    Appearance: She is well-developed. She is not ill-appearing.  HENT:     Right Ear: Tympanic membrane normal.     Left Ear: Tympanic membrane normal.  Eyes:     Pupils: Pupils are equal, round, and reactive to light.  Neck:     Thyroid: No thyromegaly.     Vascular: No JVD.  Cardiovascular:     Rate and Rhythm: Normal rate and regular rhythm.     Heart sounds:     No gallop.  Pulmonary:     Effort: Pulmonary effort is normal. No respiratory distress.     Breath sounds: Normal breath sounds. No wheezing or rales.  Musculoskeletal:     Cervical back: Neck supple.     Comments: She has compression stockings on bilaterally  Neurological:     Mental Status: She is alert.      No results found for any visits on 12/17/22.  {Labs (Optional):23779}  The ASCVD Risk score (Arnett DK, et  al., 2019) failed to calculate for the following reasons:   The patient has a prior MI or stroke diagnosis    Assessment & Plan:   #1 physical exam.   Multiple chronic problems as above.  We recommend she schedule repeat mammogram and she will consider.  Aged out of further colonoscopy.  Continue annual mammogram.  Also discussed Shingrix vaccine which she will consider  #2 type  2 diabetes.  Patient has been on Trulicity but had recent problems with getting this secondary to availability issues.  We had to switch briefly to Ozempic.  Recheck A1c today along with urine microalbumin screen.  Continue annual diabetic eye exam.  Set up 17-month follow-up  #3 hypertension well-controlled currently on losartan 80 mg daily  #4 hyperlipidemia treated with simvastatin.  Recheck lipids today.  If lipids not better to goal consider more potent statin such as rosuvastatin   Return in about 3 months (around 03/19/2023).    Evelena Peat, MD

## 2022-12-17 NOTE — Patient Instructions (Signed)
Consider setting up repeat mammogram at some point this year.    Confirm if you had Shingrix vaccine.

## 2022-12-19 DIAGNOSIS — G809 Cerebral palsy, unspecified: Secondary | ICD-10-CM | POA: Diagnosis not present

## 2022-12-19 DIAGNOSIS — Z936 Other artificial openings of urinary tract status: Secondary | ICD-10-CM | POA: Diagnosis not present

## 2022-12-19 DIAGNOSIS — G834 Cauda equina syndrome: Secondary | ICD-10-CM | POA: Diagnosis not present

## 2022-12-22 DIAGNOSIS — N99521 Infection of other external stoma of urinary tract: Secondary | ICD-10-CM | POA: Insufficient documentation

## 2022-12-23 ENCOUNTER — Ambulatory Visit (HOSPITAL_COMMUNITY): Payer: Medicare HMO | Admitting: Nurse Practitioner

## 2022-12-31 DIAGNOSIS — G809 Cerebral palsy, unspecified: Secondary | ICD-10-CM | POA: Diagnosis not present

## 2022-12-31 DIAGNOSIS — G834 Cauda equina syndrome: Secondary | ICD-10-CM | POA: Diagnosis not present

## 2022-12-31 DIAGNOSIS — Z936 Other artificial openings of urinary tract status: Secondary | ICD-10-CM | POA: Diagnosis not present

## 2023-01-13 ENCOUNTER — Other Ambulatory Visit: Payer: Self-pay | Admitting: Family Medicine

## 2023-01-13 DIAGNOSIS — E781 Pure hyperglyceridemia: Secondary | ICD-10-CM

## 2023-01-21 ENCOUNTER — Ambulatory Visit: Payer: Medicare PPO | Admitting: Family Medicine

## 2023-01-21 VITALS — BP 128/60 | HR 53 | Temp 98.1°F | Wt 200.0 lb

## 2023-01-21 DIAGNOSIS — N184 Chronic kidney disease, stage 4 (severe): Secondary | ICD-10-CM | POA: Diagnosis not present

## 2023-01-21 DIAGNOSIS — Z7985 Long-term (current) use of injectable non-insulin antidiabetic drugs: Secondary | ICD-10-CM

## 2023-01-21 DIAGNOSIS — E1122 Type 2 diabetes mellitus with diabetic chronic kidney disease: Secondary | ICD-10-CM | POA: Diagnosis not present

## 2023-01-21 DIAGNOSIS — I10 Essential (primary) hypertension: Secondary | ICD-10-CM | POA: Diagnosis not present

## 2023-01-21 DIAGNOSIS — N1832 Chronic kidney disease, stage 3b: Secondary | ICD-10-CM | POA: Diagnosis not present

## 2023-01-21 MED ORDER — FUROSEMIDE 40 MG PO TABS: 40.0000 mg | ORAL_TABLET | Freq: Two times a day (BID) | ORAL | 3 refills | Status: DC

## 2023-01-21 MED ORDER — TRULICITY 0.75 MG/0.5ML ~~LOC~~ SOAJ: 0.7500 mg | SUBCUTANEOUS | 1 refills | Status: DC

## 2023-01-21 NOTE — Progress Notes (Signed)
Established Patient Office Visit  Subjective   Patient ID: Jordan [name], female    DOB: [birth year]  Age: 77 y.o. MRN: [id number]  Chief Complaint  Patient presents with   Follow-up    Follow up from last visit. No other questions or concerns.    HPI   Jordan [name] is seen for medical follow-up.  She had recent physical exam and her kidney function had declined with GFR of 27.  Her BUN was 69.  Takes Lasix 80 mg twice daily.  Her A1c had climbed up to 8.0%.  However, she was out of Trulicity.  There were problems with availability for 3 mg dose.  She has been out now for over 2 months.  She had been out for several weeks prior to last lab.  Surprisingly, her urine microalbumin screen was normal.  She does wear compression stockings bilaterally and has no recent leg edema.  No dyspnea.  Hypertension treated with losartan 100 mg daily.  Blood pressures have been stable.  She takes simvastatin for hyperlipidemia and recent lipids were reviewed and stable.  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   PT CAN TRANSFER BED TO CHAIR--CAN STAND BRIEFLY--BUT NOT ABLE TO WALK; RT LEG HYPEREXTENDS BACKWARD AND IS WEAK-HAS A POWER CHAIR   COLONIC POLYPS 07/11/2010   Constipation    Diabetes mellitus    ORAL MED FOR DIABETES   Gallbladder problem    GERD (gastroesophageal reflux disease)    PAST HISTORY GERD--WATCHES DIET - NOT ON ANY MEDS FOR GERD   HYPERLIPIDEMIA 11/29/2009   HYPERTENSION 11/29/2009   Incisional hernia    NO PAIN-NOT CAUSING ANY DISCOMFORT   Kidney infection    Osteoarthritis    Osteomyelitis (HCC)    PONV (postoperative nausea and vomiting)    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HCC)    11-28-14 remains   Shortness of breath    Stroke (cerebrum) (HCC)    Past Surgical History:  Procedure Laterality Date   1992     ABDOMINAL  HYSTERECTOMY  1990   partial   ACHILLES TENDON LENGTHENING  1962   ADENOIDECTOMY  1950   APPENDECTOMY     CARPAL TUNNEL RELEASE  1983   CHOLECYSTECTOMY     COLONOSCOPY WITH PROPOFOL N/A 12/07/2014   Procedure: COLONOSCOPY WITH PROPOFOL;  Surgeon: Iva Boop, MD;  Location: [facility];  Service: Endoscopy;  Laterality: N/A;   LEFT CARPAL TUNNEL RELEASE X 2     LUMBAR SYMPATHETECTOMIES X 2     MULTIPLE RIGHT ANKLE SURGERIES     NEPHROLITHOTOMY  10/01/2011   Procedure: NEPHROLITHOTOMY PERCUTANEOUS;  Surgeon: Marcine Matar, MD;  Location: [facility];  Service: Urology;  Laterality: Right;        PARTIAL AMPUTATION OF LEFT GREAT TOE     1985   radical cystectomy  1992   neurogenic bladder sec CP   REPAIR OF NASAL FRACTURE  1985   REVISION UROSTOMY CUTANEOUS  1992   RIGHT CARPAL TUNNEL RELEASE     RIGHT HIP FLEXION CONTRACTURE REPAIR  1986   RT ANKLE FUSION  1975   SEVERAL RT ANKLE SURGERIES PRIOR TO THE FUSION   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--AT [facility]      TONSILLECTOMY     TUBAL  LIGATION  1975   VAGINAL HYSTERECTOMY  1990   WISDOM TOOTH EXTRACTION  1971    reports that she has never smoked. She has never used smokeless tobacco. She reports that she does not drink alcohol and does not use drugs. family history includes Alcoholism in her father and mother; Bladder Cancer in her father; Cancer in her father; Colon cancer (age of onset: 66) in her father; Colon polyps in her sister; Diabetes in her father; Emphysema in her father; Heart attack in her maternal grandfather; Heart disease in her father and maternal grandmother; Heart disease (age of onset: 73) in her mother; Hyperlipidemia in her father and mother; Hypertension in her mother; Liver disease in her father and mother; Sleep apnea in her father. Allergies  Allergen Reactions   Morphine Shortness Of Breath   Onglyza [Saxagliptin] Other (See Comments)    Headache within 45 minutes of taking this.   Baclofen     GI  upset   Lisinopril Cough   Metoclopramide Hcl     Reglan, irritable   Penicillins     " MAJOR CASE OF HIVES"    Review of Systems  Constitutional:  Negative for malaise/fatigue.  Eyes:  Negative for blurred vision.  Respiratory:  Negative for shortness of breath.   Cardiovascular:  Negative for chest pain.  Neurological:  Negative for dizziness, weakness and headaches.      Objective:     BP 128/60   Pulse (!) 53   Temp 98.1 F (36.7 C) (Oral)   Wt 200 lb (90.7 kg)   SpO2 98%   BMI 29.53 kg/m  BP Readings from Last 3 Encounters:  01/21/23 128/60  12/17/22 120/60  12/16/22 136/60   Wt Readings from Last 3 Encounters:  01/21/23 200 lb (90.7 kg)  12/05/22 200 lb (90.7 kg)  05/01/22 195 lb (88.5 kg)      Physical Exam Vitals reviewed.  Cardiovascular:     Rate and Rhythm: Normal rate and regular rhythm.  Pulmonary:     Effort: Pulmonary effort is normal.     Breath sounds: Normal breath sounds. No wheezing or rales.  Musculoskeletal:     Comments: She has knee-high support stockings on bilaterally.  No significant pitting edema  Neurological:     Mental Status: She is alert.      No results found for any visits on 01/21/23.  Last CBC Lab Results  Component Value Date   WBC 6.8 12/05/2022   HGB 11.3 (L) 12/05/2022   HCT 36.1 12/05/2022   MCV 86.4 12/05/2022   MCH 27.0 12/05/2022   RDW 15.8 (H) 12/05/2022   PLT 339 12/05/2022   Last metabolic panel Lab Results  Component Value Date   GLUCOSE 163 (H) 12/05/2022   NA 138 12/05/2022   K 3.8 12/05/2022   CL 105 12/05/2022   CO2 21 (L) 12/05/2022   BUN 69 (H) 12/05/2022   CREATININE 1.90 (H) 12/05/2022   GFRNONAA 27 (L) 12/05/2022   CALCIUM 9.3 12/05/2022   PHOS 3.6 07/09/2014   PROT 7.3 12/17/2022   ALBUMIN 3.9 12/17/2022   BILITOT 0.5 12/17/2022   ALKPHOS 95 12/17/2022   AST 9 12/17/2022   ALT 11 12/17/2022   ANIONGAP 12 12/05/2022   Last lipids Lab Results  Component Value Date    CHOL 163 12/17/2022   HDL 30.10 (L) 12/17/2022   LDLCALC 99 12/17/2022   LDLDIRECT 128.0 06/20/2022   TRIG 170.0 (H) 12/17/2022   CHOLHDL 5 12/17/2022  Last hemoglobin A1c Lab Results  Component Value Date   HGBA1C 8.0 (H) 12/17/2022      The ASCVD Risk score (Arnett DK, et al., 2019) failed to calculate for the following reasons:   The patient has a prior MI or stroke diagnosis    Assessment & Plan:   #1 type 2 diabetes poorly controlled with recent A1c 8.0%.  This likely reflects running out of Trulicity secondary to availability issues.  She would like to get back on the Trulicity.  We explained that we would need to start back over with low dosage to avoid side effects.  Send in new prescription for Trulicity 0.75 mg subcutaneous once weekly.  Reassess in 3 months.  If A1c not closer to goal consider further titration at that time  #2 hypertension stable and at goal.  Continue losartan 100 mg daily  #3 chronic kidney disease with recent worsening.  Suspect she may be somewhat volume depleted.  Reduce her Lasix to 40 mg twice daily and watch closely for any edema or dyspnea.  Continue support stockings.  Avoid any nonsteroidal.  Plan to recheck basic metabolic panel at 56-month follow-up   Evelena Peat, MD

## 2023-01-21 NOTE — Patient Instructions (Signed)
STOP the Furosemide 80 mg   START the Furosemide 40 mg twice daily  Watch for any increased leg swelling or shortness of breath.  START the Trulicity 0.75 mg [place] once weekly.  Set up 3 month follow up.Jordan [name]

## 2023-01-27 ENCOUNTER — Ambulatory Visit (HOSPITAL_COMMUNITY): Payer: Medicare PPO | Admitting: Nurse Practitioner

## 2023-01-27 DIAGNOSIS — Z936 Other artificial openings of urinary tract status: Secondary | ICD-10-CM | POA: Diagnosis not present

## 2023-01-27 DIAGNOSIS — G834 Cauda equina syndrome: Secondary | ICD-10-CM | POA: Diagnosis not present

## 2023-01-27 DIAGNOSIS — G809 Cerebral palsy, unspecified: Secondary | ICD-10-CM | POA: Diagnosis not present

## 2023-03-03 DIAGNOSIS — G809 Cerebral palsy, unspecified: Secondary | ICD-10-CM | POA: Diagnosis not present

## 2023-03-03 DIAGNOSIS — Z936 Other artificial openings of urinary tract status: Secondary | ICD-10-CM | POA: Diagnosis not present

## 2023-03-03 DIAGNOSIS — G834 Cauda equina syndrome: Secondary | ICD-10-CM | POA: Diagnosis not present

## 2023-03-04 ENCOUNTER — Emergency Department (HOSPITAL_COMMUNITY)
Admission: EM | Admit: 2023-03-04 | Discharge: 2023-03-05 | Disposition: A | Discharge status: Home / Self Care | Payer: Medicare HMO | Attending: Emergency Medicine | Admitting: Emergency Medicine

## 2023-03-04 ENCOUNTER — Other Ambulatory Visit: Payer: Self-pay

## 2023-03-04 ENCOUNTER — Encounter (HOSPITAL_COMMUNITY): Payer: Self-pay

## 2023-03-04 DIAGNOSIS — H6122 Impacted cerumen, left ear: Secondary | ICD-10-CM | POA: Diagnosis not present

## 2023-03-04 DIAGNOSIS — R42 Dizziness and giddiness: Secondary | ICD-10-CM

## 2023-03-04 DIAGNOSIS — E119 Type 2 diabetes mellitus without complications: Secondary | ICD-10-CM | POA: Insufficient documentation

## 2023-03-04 DIAGNOSIS — H6123 Impacted cerumen, bilateral: Secondary | ICD-10-CM | POA: Insufficient documentation

## 2023-03-04 DIAGNOSIS — I10 Essential (primary) hypertension: Secondary | ICD-10-CM | POA: Insufficient documentation

## 2023-03-04 DIAGNOSIS — Z7984 Long term (current) use of oral hypoglycemic drugs: Secondary | ICD-10-CM | POA: Diagnosis not present

## 2023-03-04 DIAGNOSIS — R55 Syncope and collapse: Secondary | ICD-10-CM | POA: Diagnosis not present

## 2023-03-04 DIAGNOSIS — Z79899 Other long term (current) drug therapy: Secondary | ICD-10-CM | POA: Diagnosis not present

## 2023-03-04 LAB — BASIC METABOLIC PANEL
Anion gap: 9 (ref 5–15)
BUN: 50 mg/dL — ABNORMAL HIGH (ref 8–23)
CO2: 19 mmol/L — ABNORMAL LOW (ref 22–32)
Calcium: 8.8 mg/dL — ABNORMAL LOW (ref 8.9–10.3)
Chloride: 104 mmol/L (ref 98–111)
Creatinine, Ser: 1.74 mg/dL — ABNORMAL HIGH (ref 0.44–1.00)
GFR, Estimated: 30 mL/min — ABNORMAL LOW (ref >60)
Glucose, Bld: 173 mg/dL — ABNORMAL HIGH (ref 70–99)
Potassium: 3.8 mmol/L (ref 3.5–5.1)
Sodium: 132 mmol/L — ABNORMAL LOW (ref 135–145)

## 2023-03-04 LAB — CBC
HCT: 38.2 % (ref 36.0–46.0)
Hemoglobin: 11.6 g/dL — ABNORMAL LOW (ref 12.0–15.0)
MCH: 28.5 pg (ref 26.0–34.0)
MCHC: 30.4 g/dL (ref 30.0–36.0)
MCV: 93.9 fL (ref 80.0–100.0)
Platelets: 319 10*3/uL (ref 150–400)
RBC: 4.07 MIL/uL (ref 3.87–5.11)
RDW: 13.9 % (ref 11.5–15.5)
WBC: 9.3 10*3/uL (ref 4.0–10.5)
nRBC: 0 % (ref 0.0–0.2)

## 2023-03-04 NOTE — ED Triage Notes (Signed)
C/o intermittent dizziness since Monday. Pt reports vomiting on Monday. None since Monday. Denies pain.

## 2023-03-05 ENCOUNTER — Encounter (HOSPITAL_COMMUNITY): Payer: Self-pay | Admitting: Emergency Medicine

## 2023-03-05 ENCOUNTER — Emergency Department (HOSPITAL_COMMUNITY): Payer: Medicare HMO

## 2023-03-05 DIAGNOSIS — R42 Dizziness and giddiness: Secondary | ICD-10-CM | POA: Diagnosis not present

## 2023-03-05 DIAGNOSIS — R55 Syncope and collapse: Secondary | ICD-10-CM | POA: Diagnosis not present

## 2023-03-05 LAB — CBG MONITORING, ED: Glucose-Capillary: 129 mg/dL — ABNORMAL HIGH (ref 70–99)

## 2023-03-05 MED ORDER — CARBAMIDE PEROXIDE 6.5 % OT SOLN
5.0000 [drp] | OTIC | Status: DC
  Filled 2023-03-05: qty 15

## 2023-03-05 MED ORDER — MECLIZINE HCL 25 MG PO TABS
25.0000 mg | ORAL_TABLET | Freq: Once | ORAL | Status: AC
  Administered 2023-03-05: 25 mg via ORAL
  Filled 2023-03-05: qty 1

## 2023-03-05 MED ORDER — MECLIZINE HCL 12.5 MG PO TABS: 12.5000 mg | ORAL_TABLET | Freq: Three times a day (TID) | ORAL | 0 refills | Status: DC | PRN

## 2023-03-05 MED ORDER — ONDANSETRON 8 MG PO TBDP
8.0000 mg | ORAL_TABLET | Freq: Once | ORAL | Status: AC
  Administered 2023-03-05: 8 mg via ORAL
  Filled 2023-03-05: qty 1

## 2023-03-05 NOTE — ED Provider Notes (Addendum)
[place] EMERGENCY DEPARTMENT AT [facility] Provider Note   CSN: [id number] Arrival date & time: 03/04/23  1749     History  Chief Complaint  Patient presents with   Dizziness    Jordan [name] is a 77 y.o. female.   Dizziness Quality:  Head spinning Severity:  Severe Onset quality:  Gradual Duration:  3 days Timing:  Intermittent Progression:  Waxing and waning Chronicity:  New Context: bending over, head movement and physical activity   Relieved by:  Nothing Worsened by:  Nothing Ineffective treatments:  None tried Associated symptoms: nausea and vomiting   Associated symptoms: no blood in stool, no chest pain, no diarrhea, no palpitations, no shortness of breath, no syncope, no tinnitus, no vision changes and no weakness   Associated symptoms comment:  Already wears hearing aides and has problems with ear wax Risk factors: no hx of vertigo and no Meniere's disease   Patient with HTN and hyperlipidemia who presents with intermitted vertigo and nausea and emesis.  No weakness. No changes in vision or speech.     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   PT CAN TRANSFER BED TO CHAIR--CAN STAND BRIEFLY--BUT NOT ABLE TO WALK; RT LEG HYPEREXTENDS BACKWARD AND IS WEAK-HAS A POWER CHAIR   COLONIC POLYPS 07/11/2010   Constipation    Diabetes mellitus    ORAL MED FOR DIABETES   Gallbladder problem    GERD (gastroesophageal reflux disease)    PAST HISTORY GERD--WATCHES DIET - NOT ON ANY MEDS FOR GERD   HYPERLIPIDEMIA 11/29/2009   HYPERTENSION 11/29/2009   Incisional hernia    NO PAIN-NOT CAUSING ANY DISCOMFORT   Kidney infection    Osteoarthritis    Osteomyelitis (HCC)    PONV (postoperative nausea and vomiting)    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HCC)    11-28-14 remains   Shortness of breath    Stroke  (cerebrum) (HCC)      Home Medications Prior to Admission medications   Medication Sig Start Date End Date Taking? Authorizing Provider  Accu-Chek FastClix Lancets MISC Use to check blood sugar once daily. Dx Code E11.65 06/01/19   Burchette, Elberta Fortis, MD  acetaminophen (TYLENOL) 500 MG tablet Take 500 mg by mouth every 6 (six) hours as needed for mild pain. Pain     [provider]  Alcohol Swabs PADS Use as directed 05/31/20   Burchette, Elberta Fortis, MD  Blood Glucose Calibration (TRUE METRIX LEVEL 1) Low SOLN Use as directed 05/31/20   Burchette, Elberta Fortis, MD  Blood Glucose Monitoring Suppl (ACCU-CHEK NANO SMARTVIEW) w/Device KIT Use to check blood glucose 2 times daily. 06/21/19   Burchette, Elberta Fortis, MD  Blood Glucose Monitoring Suppl (TRUE METRIX METER) DEVI Use as directed 05/31/20   Burchette, Elberta Fortis, MD  cetirizine (ZYRTEC) 10 MG tablet Take 1 tablet (10 mg total) by mouth daily as needed. allergies 07/26/15   Burchette, Elberta Fortis, MD  Cholecalciferol (VITAMIN D) 2000 UNITS CAPS Take 2,000 Units by mouth. Takes only on days Monday through Thursday    [provider]  Dulaglutide (TRULICITY) 0.75 MG/0.5ML SOPN Inject 0.75 mg into the skin once a week. 01/21/23   Burchette, Elberta Fortis, MD  furosemide (LASIX) 40 MG tablet Take 1 tablet (40  mg total) by mouth 2 (two) times daily. 01/21/23   Burchette, Elberta Fortis, MD  gabapentin (NEURONTIN) 100 MG capsule TAKE 1 TO 2 CAPSULES BY MOUTH AT NIGHT AS NEEDED FOR NERVE PAIN 09/16/22   Burchette, Elberta Fortis, MD  glucose blood (ACCU-CHEK SMARTVIEW) test strip USE TO CHECK BLOOD GLUCOSE TWICE DAILY AS DIRECTED 05/31/18   Burchette, Elberta Fortis, MD  losartan (COZAAR) 100 MG tablet TAKE 1 TABLET BY MOUTH DAILY 10/23/22   Burchette, Elberta Fortis, MD  Multiple Vitamin (MULTIVITAMIN) capsule Take 1 capsule by mouth daily.    [provider]  Omega-3 Fatty Acids (FISH OIL) 1200 MG CAPS Take 1,200 mg by mouth 2 (two) times daily.    [provider]   polyethylene glycol powder (GLYCOLAX/MIRALAX) powder Take 17 g by mouth daily as needed. One capfull daily by mouth with liquid    [provider]  senna (SENOKOT) 8.6 MG tablet Take 1 tablet by mouth daily as needed.     [provider]  simvastatin (ZOCOR) 40 MG tablet TAKE 1 TABLET BY MOUTH EVERY EVENING 01/13/23   Burchette, Elberta Fortis, MD  sodium chloride (OCEAN) 0.65 % nasal spray Place 1-2 sprays into the nose as needed. Allergies     [provider]  TRUEplus Lancets 28G MISC Use as directed 05/31/20   Burchette, Elberta Fortis, MD      Allergies    Morphine, Onglyza [saxagliptin], Baclofen, Lisinopril, Metoclopramide hcl, and Penicillins    Review of Systems   Review of Systems  Constitutional:  Negative for fever.  HENT:  Negative for tinnitus.   Respiratory:  Negative for shortness of breath.   Cardiovascular:  Negative for chest pain, palpitations and syncope.  Gastrointestinal:  Positive for nausea and vomiting. Negative for blood in stool and diarrhea.  Neurological:  Positive for dizziness. Negative for facial asymmetry, speech difficulty, weakness and numbness.  All other systems reviewed and are negative.   Physical Exam Updated Vital Signs BP (!) 161/60 (BP Location: Left Arm)   Pulse 60   Temp 97.7 F (36.5 C) (Oral)   Resp 13   Ht 5\' 9"  (1.753 m)   Wt 90 kg   SpO2 100%   BMI 29.30 kg/m  Physical Exam Vitals and nursing note reviewed.  Constitutional:      General: She is not in acute distress.    Appearance: Normal appearance. She is well-developed.  HENT:     Head: Normocephalic and atraumatic.     Right Ear: There is impacted cerumen.     Left Ear: There is impacted cerumen.     Nose: Nose normal.     Mouth/Throat:     Mouth: Mucous membranes are moist.     Pharynx: Oropharynx is clear.  Eyes:     Pupils: Pupils are equal, round, and reactive to light.  Cardiovascular:     Rate and Rhythm: Normal rate and regular rhythm.      Pulses: Normal pulses.     Heart sounds: Normal heart sounds.  Pulmonary:     Effort: Pulmonary effort is normal. No respiratory distress.     Breath sounds: Normal breath sounds.  Abdominal:     General: Bowel sounds are normal. There is no distension.     Palpations: Abdomen is soft.     Tenderness: There is no abdominal tenderness. There is no guarding or rebound.  Musculoskeletal:        General: Normal range of motion.     Cervical  back: Normal range of motion and neck supple.  Skin:    General: Skin is warm and dry.     Capillary Refill: Capillary refill takes less than 2 seconds.     Findings: No erythema or rash.  Neurological:     General: No focal deficit present.     Mental Status: She is alert and oriented to person, place, and time.     Cranial Nerves: No cranial nerve deficit.     Deep Tendon Reflexes: Reflexes normal.  Psychiatric:        Mood and Affect: Mood normal.     ED Results / Procedures / Treatments   Labs (all labs ordered are listed, but only abnormal results are displayed) Results for orders placed or performed during the hospital encounter of 03/04/23  Basic metabolic panel  Result Value Ref Range   Sodium 132 (L) 135 - 145 mmol/L   Potassium 3.8 3.5 - 5.1 mmol/L   Chloride 104 98 - 111 mmol/L   CO2 19 (L) 22 - 32 mmol/L   Glucose, Bld 173 (H) 70 - 99 mg/dL   BUN 50 (H) 8 - 23 mg/dL   Creatinine, Ser 1.61 (H) 0.44 - 1.00 mg/dL   Calcium 8.8 (L) 8.9 - 10.3 mg/dL   GFR, Estimated 30 (L) >60 mL/min   Anion gap 9 5 - 15  CBC  Result Value Ref Range   WBC 9.3 4.0 - 10.5 K/uL   RBC 4.07 3.87 - 5.11 MIL/uL   Hemoglobin 11.6 (L) 12.0 - 15.0 g/dL   HCT 09.6 04.5 - 40.9 %   MCV 93.9 80.0 - 100.0 fL   MCH 28.5 26.0 - 34.0 pg   MCHC 30.4 30.0 - 36.0 g/dL   RDW 81.1 91.4 - 78.2 %   Platelets 319 150 - 400 K/uL   nRBC 0.0 0.0 - 0.2 %  CBG monitoring, ED  Result Value Ref Range   Glucose-Capillary 129 (H) 70 - 99 mg/dL   CT Head Wo  Contrast  Result Date: 03/05/2023 CLINICAL DATA:  Dizziness for 4 days EXAM: CT HEAD WITHOUT CONTRAST TECHNIQUE: Contiguous axial images were obtained from the base of the skull through the vertex without intravenous contrast. RADIATION DOSE REDUCTION: This exam was performed according to the departmental dose-optimization program which includes automated exposure control, adjustment of the mA and/or kV according to patient size and/or use of iterative reconstruction technique. COMPARISON:  No prior CT available, correlation is made with MRI head 10/24/2011 FINDINGS: Brain: No evidence of acute infarction, hemorrhage, mass, mass effect, or midline shift. No hydrocephalus or extra-axial fluid collection. Unchanged configuration of the ventricles, with prominence of the lateral ventricular atria. Mildly increased size of the ventricles appears commensurate with age related cerebral volume loss. Periventricular white matter changes, likely the sequela of chronic small vessel ischemic disease. Vascular: No hyperdense vessel. Skull: Negative for fracture or focal lesion. Sinuses/Orbits: Mucosal thickening in the left sphenoid sinus, which is hypoplastic. Otherwise clear paranasal sinuses Status post bilateral lens replacements. Other: The mastoid air cells are well aerated. No fluid in the middle ear. IMPRESSION: No acute intracranial process. No etiology is seen for the patient's dizziness. Electronically Signed   By: Wiliam Ke M.D.   On: 03/05/2023 03:12    EKG EKG Interpretation Date/Time:  Wednesday March 04 2023 18:36:15 EDT Ventricular Rate:  72 PR Interval:  185 QRS Duration:  99 QT Interval:  403 QTC Calculation: 441 R Axis:   16  Text Interpretation:  Sinus rhythm Low voltage, precordial leads Abnormal R-wave progression, early transition Confirmed by Nicanor Alcon, Hillary Struss ([id number]) on 03/05/2023 1:54:39 AM  Radiology CT Head Wo Contrast  Result Date: 03/05/2023 CLINICAL DATA:  Dizziness for 4  days EXAM: CT HEAD WITHOUT CONTRAST TECHNIQUE: Contiguous axial images were obtained from the base of the skull through the vertex without intravenous contrast. RADIATION DOSE REDUCTION: This exam was performed according to the departmental dose-optimization program which includes automated exposure control, adjustment of the mA and/or kV according to patient size and/or use of iterative reconstruction technique. COMPARISON:  No prior CT available, correlation is made with MRI head 10/24/2011 FINDINGS: Brain: No evidence of acute infarction, hemorrhage, mass, mass effect, or midline shift. No hydrocephalus or extra-axial fluid collection. Unchanged configuration of the ventricles, with prominence of the lateral ventricular atria. Mildly increased size of the ventricles appears commensurate with age related cerebral volume loss. Periventricular white matter changes, likely the sequela of chronic small vessel ischemic disease. Vascular: No hyperdense vessel. Skull: Negative for fracture or focal lesion. Sinuses/Orbits: Mucosal thickening in the left sphenoid sinus, which is hypoplastic. Otherwise clear paranasal sinuses Status post bilateral lens replacements. Other: The mastoid air cells are well aerated. No fluid in the middle ear. IMPRESSION: No acute intracranial process. No etiology is seen for the patient's dizziness. Electronically Signed   By: Wiliam Ke M.D.   On: 03/05/2023 03:12    Procedures .Ear Cerumen Removal  Date/Time: 03/05/2023 6:05 AM  Performed by: Cy Blamer, MD Authorized by: Cy Blamer, MD   Consent:    Consent obtained:  Verbal   Consent given by:  Patient   Risks discussed:  Bleeding and dizziness   Alternatives discussed:  No treatment Universal protocol:    Patient identity confirmed:  Arm band Procedure details:    Location:  R ear and L ear   Procedure type: curette     Procedure outcomes: cerumen removed   Post-procedure details:    Inspection:  Ear canal  clear   Procedure completion:  Tolerated well, no immediate complications     Medications Ordered in ED Medications  ondansetron (ZOFRAN-ODT) disintegrating tablet 8 mg (8 mg Oral Given 03/05/23 0244)  meclizine (ANTIVERT) tablet 25 mg (25 mg Oral Given 03/05/23 0244)    ED Course/ Medical Decision Making/ A&P                                 Medical Decision Making Patient with intermitted vertigo this week   Amount and/or Complexity of Data Reviewed Independent Historian: spouse    Details: See above.   External Data Reviewed: notes.    Details: Previous notes reviewed  Labs: ordered.    Details: Sodium slight low  Radiology: ordered and independent interpretation performed.    Details: Negative head CT by me  Discussion of management or test interpretation with external provider(s): Dw Dr. Otelia Limes, MRI if negative DC  Risk OTC drugs. Prescription drug management. Risk Details: Well appearing.  MRI is normal debrox instilled for cerumen impaction.  This is peripheral vertiso stable for discharge with close follow up.  Strict return     Final Clinical Impression(s) / ED Diagnoses Final diagnoses:  Vertigo   Return for intractable cough, coughing up blood, fevers > 100.4 unrelieved by medication, shortness of breath, intractable vomiting, chest pain, shortness of breath, weakness, numbness, changes in speech, facial asymmetry, abdominal pain, passing out, Inability to tolerate liquids or  food, cough, altered mental status or any concerns. No signs of systemic illness or infection. The patient is nontoxic-appearing on exam and vital signs are within normal limits.  I have reviewed the triage vital signs and the nursing notes. Pertinent labs & imaging results that were available during my care of the patient were reviewed by me and considered in my medical decision making (see chart for details). After history, exam, and medical workup I feel the patient has been appropriately  medically screened and is safe for discharge home. Pertinent diagnoses were discussed with the patient. Patient was given return precautions.        Jordan Tocco, MD 03/05/23 [id number]

## 2023-03-23 ENCOUNTER — Ambulatory Visit (INDEPENDENT_AMBULATORY_CARE_PROVIDER_SITE_OTHER): Payer: Medicare HMO

## 2023-03-23 DIAGNOSIS — Z23 Encounter for immunization: Secondary | ICD-10-CM

## 2023-04-01 DIAGNOSIS — G834 Cauda equina syndrome: Secondary | ICD-10-CM | POA: Diagnosis not present

## 2023-04-01 DIAGNOSIS — G809 Cerebral palsy, unspecified: Secondary | ICD-10-CM | POA: Diagnosis not present

## 2023-04-01 DIAGNOSIS — Z936 Other artificial openings of urinary tract status: Secondary | ICD-10-CM | POA: Diagnosis not present

## 2023-04-06 ENCOUNTER — Other Ambulatory Visit: Payer: Medicare HMO

## 2023-04-16 ENCOUNTER — Other Ambulatory Visit: Payer: Self-pay | Admitting: Family Medicine

## 2023-04-16 DIAGNOSIS — I10 Essential (primary) hypertension: Secondary | ICD-10-CM

## 2023-04-21 ENCOUNTER — Other Ambulatory Visit: Payer: Self-pay | Admitting: Family Medicine

## 2023-05-29 ENCOUNTER — Other Ambulatory Visit: Payer: Self-pay | Admitting: Family Medicine

## 2023-06-02 DIAGNOSIS — G809 Cerebral palsy, unspecified: Secondary | ICD-10-CM | POA: Diagnosis not present

## 2023-06-02 DIAGNOSIS — G834 Cauda equina syndrome: Secondary | ICD-10-CM | POA: Diagnosis not present

## 2023-06-02 DIAGNOSIS — Z936 Other artificial openings of urinary tract status: Secondary | ICD-10-CM | POA: Diagnosis not present

## 2023-07-31 ENCOUNTER — Telehealth: Payer: Self-pay

## 2023-07-31 NOTE — Telephone Encounter (Signed)
Copied from CRM [id number]. Topic: General - Other >> Jul 31, 2023  1:46 PM Larwance Sachs wrote: Reason for CRM: Bri from [place] called in regarding needing prior auth for ostomy supplies and asked can it be expedite for 24 hours

## 2023-09-07 ENCOUNTER — Other Ambulatory Visit: Payer: Self-pay | Admitting: Family Medicine

## 2023-09-07 DIAGNOSIS — I10 Essential (primary) hypertension: Secondary | ICD-10-CM

## 2023-09-09 ENCOUNTER — Other Ambulatory Visit: Payer: Self-pay | Admitting: Family Medicine

## 2023-10-23 ENCOUNTER — Emergency Department (HOSPITAL_COMMUNITY)

## 2023-10-23 ENCOUNTER — Emergency Department (HOSPITAL_COMMUNITY)
Admission: EM | Admit: 2023-10-23 | Discharge: 2023-10-23 | Disposition: A | Discharge status: Home / Self Care | Attending: Emergency Medicine | Admitting: Emergency Medicine

## 2023-10-23 ENCOUNTER — Other Ambulatory Visit: Payer: Self-pay

## 2023-10-23 DIAGNOSIS — Z79899 Other long term (current) drug therapy: Secondary | ICD-10-CM | POA: Diagnosis not present

## 2023-10-23 DIAGNOSIS — Z8673 Personal history of transient ischemic attack (TIA), and cerebral infarction without residual deficits: Secondary | ICD-10-CM | POA: Insufficient documentation

## 2023-10-23 DIAGNOSIS — R000 Tachycardia, unspecified: Secondary | ICD-10-CM | POA: Diagnosis not present

## 2023-10-23 DIAGNOSIS — E119 Type 2 diabetes mellitus without complications: Secondary | ICD-10-CM | POA: Diagnosis not present

## 2023-10-23 DIAGNOSIS — N289 Disorder of kidney and ureter, unspecified: Secondary | ICD-10-CM | POA: Insufficient documentation

## 2023-10-23 DIAGNOSIS — R002 Palpitations: Secondary | ICD-10-CM | POA: Diagnosis not present

## 2023-10-23 DIAGNOSIS — D649 Anemia, unspecified: Secondary | ICD-10-CM | POA: Insufficient documentation

## 2023-10-23 DIAGNOSIS — I10 Essential (primary) hypertension: Secondary | ICD-10-CM | POA: Insufficient documentation

## 2023-10-23 LAB — BASIC METABOLIC PANEL WITH GFR
Anion gap: 7 (ref 5–15)
BUN: 59 mg/dL — ABNORMAL HIGH (ref 8–23)
CO2: 19 mmol/L — ABNORMAL LOW (ref 22–32)
Calcium: 8.5 mg/dL — ABNORMAL LOW (ref 8.9–10.3)
Chloride: 109 mmol/L (ref 98–111)
Creatinine, Ser: 1.77 mg/dL — ABNORMAL HIGH (ref 0.44–1.00)
GFR, Estimated: 29 mL/min — ABNORMAL LOW (ref >60)
Glucose, Bld: 188 mg/dL — ABNORMAL HIGH (ref 70–99)
Potassium: 4.3 mmol/L (ref 3.5–5.1)
Sodium: 135 mmol/L (ref 135–145)

## 2023-10-23 LAB — CBC
HCT: 33.4 % — ABNORMAL LOW (ref 36.0–46.0)
Hemoglobin: 10.1 g/dL — ABNORMAL LOW (ref 12.0–15.0)
MCH: 28.9 pg (ref 26.0–34.0)
MCHC: 30.2 g/dL (ref 30.0–36.0)
MCV: 95.4 fL (ref 80.0–100.0)
Platelets: 289 10*3/uL (ref 150–400)
RBC: 3.5 MIL/uL — ABNORMAL LOW (ref 3.87–5.11)
RDW: 13.7 % (ref 11.5–15.5)
WBC: 9 10*3/uL (ref 4.0–10.5)
nRBC: 0 % (ref 0.0–0.2)

## 2023-10-23 LAB — TROPONIN I (HIGH SENSITIVITY): Troponin I (High Sensitivity): 6 ng/L (ref <18)

## 2023-10-23 NOTE — ED Provider Notes (Signed)
 [place] EMERGENCY DEPARTMENT AT [facility] Provider Note   CSN: [id number] Arrival date & time: 10/23/23  0347     History  Chief Complaint  Patient presents with   Palpitations    Jordan [name] is a 78 y.o. female.  The history is provided by the patient.  Palpitations She has history of hypertension, diabetes, hyperlipidemia, cerebral palsy, stroke and comes in because of an episode where her heart was racing at home.  She states that it was racing for about 20 minutes before it stopped.  She denies chest pain, heaviness, tightness, pressure.  She does endorse some mild dyspnea but denies nausea or diaphoresis.  She has had a similar episode once in the past, no diagnosis was made.   Home Medications Prior to Admission medications   Medication Sig Start Date End Date Taking? Authorizing Provider  Accu-Chek FastClix Lancets MISC Use to check blood sugar once daily. Dx Code E11.65 06/01/19   Burchette, Marijean Shouts, MD  acetaminophen  (TYLENOL ) 500 MG tablet Take 500 mg by mouth every 6 (six) hours as needed for mild pain. Pain     [provider]  Alcohol  Swabs  PADS Use as directed 05/31/20   Burchette, Marijean Shouts, MD  Blood Glucose Calibration (TRUE METRIX LEVEL 1) Low SOLN Use as directed 05/31/20   Burchette, Marijean Shouts, MD  Blood Glucose Monitoring Suppl (ACCU-CHEK NANO SMARTVIEW) w/Device KIT Use to check blood glucose 2 times daily. 06/21/19   Burchette, Marijean Shouts, MD  Blood Glucose Monitoring Suppl (TRUE METRIX METER) DEVI Use as directed 05/31/20   Burchette, Marijean Shouts, MD  cetirizine  (ZYRTEC ) 10 MG tablet Take 1 tablet (10 mg total) by mouth daily as needed. allergies 07/26/15   Burchette, Marijean Shouts, MD  Cholecalciferol  (VITAMIN D ) 2000 UNITS CAPS Take 2,000 Units by mouth. Takes only on days Monday through Thursday    [provider]  Dulaglutide  (TRULICITY ) 0.75 MG/0.5ML SOPN Inject 0.75 mg into the skin once a week. 01/21/23   Burchette, Marijean Shouts, MD  furosemide   (LASIX ) 40 MG tablet Take 1 tablet (40 mg total) by mouth 2 (two) times daily. 01/21/23   Burchette, Marijean Shouts, MD  gabapentin  (NEURONTIN ) 100 MG capsule TAKE 1 TO 2 CAPSULES BY MOUTH AT NIGHT AS NEEDED FOR NERVE PAIN 09/09/23   Burchette, Marijean Shouts, MD  glucose blood (ACCU-CHEK SMARTVIEW) test strip USE TO CHECK BLOOD GLUCOSE TWICE DAILY AS DIRECTED 05/31/18   Burchette, Marijean Shouts, MD  losartan  (COZAAR ) 100 MG tablet TAKE 1 TABLET BY MOUTH DAILY 09/07/23   Burchette, Marijean Shouts, MD  meclizine  (ANTIVERT ) 12.5 MG tablet Take 1 tablet (12.5 mg total) by mouth 3 (three) times daily as needed for dizziness. 03/05/23   Palumbo, April, MD  Multiple Vitamin (MULTIVITAMIN) capsule Take 1 capsule by mouth daily.    [provider]  Omega-3 Fatty Acids (FISH OIL) 1200 MG CAPS Take 1,200 mg by mouth 2 (two) times daily.    [provider]  polyethylene glycol powder (GLYCOLAX /MIRALAX ) powder Take 17 g by mouth daily as needed. One capfull daily by mouth with liquid    [provider]  senna (SENOKOT) 8.6 MG tablet Take 1 tablet by mouth daily as needed.     [provider]  simvastatin  (ZOCOR ) 40 MG tablet TAKE 1 TABLET BY MOUTH EVERY EVENING 01/13/23   Burchette, Marijean Shouts, MD  sodium chloride  (OCEAN) 0.65 % nasal spray Place 1-2 sprays into the nose as needed. Allergies  [provider]  TRUEplus Lancets 28G MISC Use as directed 05/31/20   Burchette, Marijean Shouts, MD      Allergies    Morphine, Onglyza [saxagliptin ], Baclofen, Lisinopril, Metoclopramide hcl, and Penicillins    Review of Systems   Review of Systems  Cardiovascular:  Positive for palpitations.  All other systems reviewed and are negative.   Physical Exam Updated Vital Signs BP (!) 124/46   Pulse 70   Temp (!) 97.4 F (36.3 C) (Oral)   Resp (!) 21   SpO2 100%  Physical Exam Vitals and nursing note reviewed.   78 year old female, resting comfortably and in no acute distress. Vital signs are normal.  Oxygen saturation is 100%, which is normal. Head is normocephalic and atraumatic. PERRLA, EOMI. Oropharynx is clear. Neck is nontender and supple without adenopathy or JVD. Lungs are clear without rales, wheezes, or rhonchi. Chest is nontender. Heart has regular rate and rhythm without murmur. Abdomen is soft, flat, nontender. Neurologic: Mental status is normal, cranial nerves are intact, moves all extremities equally.  ED Results / Procedures / Treatments   Labs (all labs ordered are listed, but only abnormal results are displayed) Labs Reviewed  CBC - Abnormal; Notable for the following components:      Result Value   RBC 3.50 (*)    Hemoglobin 10.1 (*)    HCT 33.4 (*)    All other components within normal limits  BASIC METABOLIC PANEL WITH GFR  TROPONIN I (HIGH SENSITIVITY)    EKG EKG Interpretation Date/Time:  Friday Oct 23 2023 03:56:29 EDT Ventricular Rate:  66 PR Interval:  202 QRS Duration:  96 QT Interval:  415 QTC Calculation: 435 R Axis:   43  Text Interpretation: Sinus rhythm Low voltage, precordial leads Abnormal R-wave progression, early transition When compared with ECG of 03/04/2023, No significant change was found Confirmed by Alissa April ([id number]) on 10/23/2023 4:45:58 AM  Radiology No results found.  Procedures Procedures  Cardiac monitor shows normal sinus rhythm, per my interpretation.  Medications Ordered in ED Medications - No data to display  ED Course/ Medical Decision Making/ A&P                                 Medical Decision Making Amount and/or Complexity of Data Reviewed Labs: ordered. Radiology: ordered.   Self-limited episode of palpitations.  Differential diagnosis includes, but is not limited to, sinus tachycardia, paroxysmal atrial fibrillation, paroxysmal atrial flutter, paroxysmal supraventricular tachycardia.  Doubt ventricular tachycardia.  I have reviewed her electrocardiogram, and my interpretation is low voltage but no ST  or T changes and ECG is unchanged from prior.  I have reviewed her initial laboratory results and my interpretation is normochromic normocytic anemia which is slightly worse than in 2024 but at a level she had been at in 2023.  Metabolic panel and troponin are pending at this time.  I have reviewed her additional laboratory tests, my interpretation is normal troponin, stable renal insufficiency.  I am referring her to cardiology for consideration for outpatient cardiac monitoring.  While in the ED, cardiac monitor continues to show sinus rhythm although she has had occasional PVCs.  Patient advised to return if symptoms are worsening.  Final Clinical Impression(s) / ED Diagnoses Final diagnoses:  Palpitations  Renal insufficiency  Normochromic normocytic anemia    Rx / DC Orders ED Discharge Orders     None  Alissa April, MD 10/23/23 [phone]

## 2023-10-23 NOTE — Discharge Instructions (Addendum)
 Please follow-up with the cardiologist.  They will likely arrange for you to have a heart monitor to wear as an outpatient.  Return to the emergency department if you have any new or concerning symptoms.

## 2023-10-23 NOTE — ED Triage Notes (Signed)
 Pt BIB [facility] from home. Pt c/o racing heart. Pt reports this has never happened before. Pt sinus rhythm of 80HR. Denies chest pain/SOB 140/70 74HR 99% RA

## 2023-10-26 ENCOUNTER — Other Ambulatory Visit: Payer: Self-pay

## 2023-10-26 ENCOUNTER — Emergency Department (HOSPITAL_COMMUNITY)
Admission: EM | Admit: 2023-10-26 | Discharge: 2023-10-26 | Disposition: A | Discharge status: Home / Self Care | Attending: Emergency Medicine | Admitting: Emergency Medicine

## 2023-10-26 ENCOUNTER — Encounter (HOSPITAL_COMMUNITY): Payer: Self-pay | Admitting: *Deleted

## 2023-10-26 DIAGNOSIS — I10 Essential (primary) hypertension: Secondary | ICD-10-CM | POA: Diagnosis not present

## 2023-10-26 DIAGNOSIS — R002 Palpitations: Secondary | ICD-10-CM | POA: Insufficient documentation

## 2023-10-26 DIAGNOSIS — D649 Anemia, unspecified: Secondary | ICD-10-CM | POA: Insufficient documentation

## 2023-10-26 DIAGNOSIS — Z8673 Personal history of transient ischemic attack (TIA), and cerebral infarction without residual deficits: Secondary | ICD-10-CM | POA: Diagnosis not present

## 2023-10-26 DIAGNOSIS — R600 Localized edema: Secondary | ICD-10-CM | POA: Insufficient documentation

## 2023-10-26 DIAGNOSIS — E119 Type 2 diabetes mellitus without complications: Secondary | ICD-10-CM | POA: Insufficient documentation

## 2023-10-26 DIAGNOSIS — R001 Bradycardia, unspecified: Secondary | ICD-10-CM | POA: Diagnosis not present

## 2023-10-26 LAB — BASIC METABOLIC PANEL WITH GFR
Anion gap: 9 (ref 5–15)
BUN: 56 mg/dL — ABNORMAL HIGH (ref 8–23)
CO2: 16 mmol/L — ABNORMAL LOW (ref 22–32)
Calcium: 8.9 mg/dL (ref 8.9–10.3)
Chloride: 112 mmol/L — ABNORMAL HIGH (ref 98–111)
Creatinine, Ser: 1.77 mg/dL — ABNORMAL HIGH (ref 0.44–1.00)
GFR, Estimated: 29 mL/min — ABNORMAL LOW (ref >60)
Glucose, Bld: 186 mg/dL — ABNORMAL HIGH (ref 70–99)
Potassium: 4.3 mmol/L (ref 3.5–5.1)
Sodium: 137 mmol/L (ref 135–145)

## 2023-10-26 LAB — CBC WITH DIFFERENTIAL/PLATELET
Abs Immature Granulocytes: 0.04 10*3/uL (ref 0.00–0.07)
Basophils Absolute: 0.1 10*3/uL (ref 0.0–0.1)
Basophils Relative: 1 %
Eosinophils Absolute: 0.1 10*3/uL (ref 0.0–0.5)
Eosinophils Relative: 2 %
HCT: 32.7 % — ABNORMAL LOW (ref 36.0–46.0)
Hemoglobin: 10.2 g/dL — ABNORMAL LOW (ref 12.0–15.0)
Immature Granulocytes: 1 %
Lymphocytes Relative: 29 %
Lymphs Abs: 2 10*3/uL (ref 0.7–4.0)
MCH: 29.1 pg (ref 26.0–34.0)
MCHC: 31.2 g/dL (ref 30.0–36.0)
MCV: 93.4 fL (ref 80.0–100.0)
Monocytes Absolute: 0.5 10*3/uL (ref 0.1–1.0)
Monocytes Relative: 8 %
Neutro Abs: 4.1 10*3/uL (ref 1.7–7.7)
Neutrophils Relative %: 59 %
Platelets: 307 10*3/uL (ref 150–400)
RBC: 3.5 MIL/uL — ABNORMAL LOW (ref 3.87–5.11)
RDW: 13.3 % (ref 11.5–15.5)
WBC: 6.8 10*3/uL (ref 4.0–10.5)
nRBC: 0 % (ref 0.0–0.2)

## 2023-10-26 LAB — TROPONIN I (HIGH SENSITIVITY): Troponin I (High Sensitivity): 7 ng/L (ref <18)

## 2023-10-26 LAB — TSH: TSH: 2.095 u[IU]/mL (ref 0.350–4.500)

## 2023-10-26 NOTE — ED Provider Notes (Signed)
 [name] AT [facility] Provider Note   CSN: [id number] Arrival date & time: 10/26/23  0848     History  Chief Complaint  Patient presents with   Palpitations    Jordan [name] is a 78 y.o. female with a past medical history of type 2 diabetes, previous cerebellar stroke, obesity, history of cerebral palsy with mild weakness on the right side, history of chronic peripheral edema right greater than left with a chronic unhealing ulcer.  Patient reports that this morning she was going about her normal day when she had onset of palpitations lasting for few minutes.  She called 911 and symptoms had resolved prior to their evaluation.  She denies chest pain or shortness of breath states that she just wanted to come in to be checked.  Recently seen for the same.  Was referred to [facility] cardiology but has not yet been given an appointment.  Palpitations      Home Medications Prior to Admission medications   Medication Sig Start Date End Date Taking? Authorizing Provider  acetaminophen  (TYLENOL ) 500 MG tablet Take 500 mg by mouth every 6 (six) hours as needed for mild pain. Pain    Yes [provider]  Dulaglutide  (TRULICITY ) 0.75 MG/0.5ML SOPN Inject 0.75 mg into the skin once a week. 01/21/23  Yes Burchette, Marijean Shouts, MD  gabapentin  (NEURONTIN ) 100 MG capsule TAKE 1 TO 2 CAPSULES BY MOUTH AT NIGHT AS NEEDED FOR NERVE PAIN Patient taking differently: Take 100-200 mg by mouth at bedtime as needed (nerve pain). TAKE 1 TO 2 CAPSULES BY MOUTH AT NIGHT AS NEEDED FOR NERVE PAIN 09/09/23  Yes Burchette, Marijean Shouts, MD  losartan  (COZAAR ) 100 MG tablet TAKE 1 TABLET BY MOUTH DAILY 09/07/23  Yes Burchette, Marijean Shouts, MD  Multiple Vitamin (MULTIVITAMIN) capsule Take 1 capsule by mouth daily.   Yes [provider]  simvastatin  (ZOCOR ) 40 MG tablet TAKE 1 TABLET BY MOUTH EVERY EVENING Patient taking differently: Take 40 mg by mouth every evening. TAKE 1 TABLET BY  MOUTH EVERY EVENING 01/13/23  Yes Burchette, Marijean Shouts, MD  Accu-Chek FastClix Lancets MISC Use to check blood sugar once daily. Dx Code E11.65 06/01/19   Marquetta Sit, MD  Alcohol  Swabs  PADS Use as directed 05/31/20   Marquetta Sit, MD  Blood Glucose Calibration (TRUE METRIX LEVEL 1) Low SOLN Use as directed 05/31/20   Burchette, Marijean Shouts, MD  Blood Glucose Monitoring Suppl (ACCU-CHEK NANO SMARTVIEW) w/Device KIT Use to check blood glucose 2 times daily. 06/21/19   Burchette, Marijean Shouts, MD  Blood Glucose Monitoring Suppl (TRUE METRIX METER) DEVI Use as directed 05/31/20   Burchette, Marijean Shouts, MD  glucose blood (ACCU-CHEK SMARTVIEW) test strip USE TO CHECK BLOOD GLUCOSE TWICE DAILY AS DIRECTED 05/31/18   Burchette, Marijean Shouts, MD  TRUEplus Lancets 28G MISC Use as directed 05/31/20   Marquetta Sit, MD      Allergies    Morphine, Onglyza [saxagliptin ], Baclofen, Lisinopril, Metoclopramide hcl, and Penicillins    Review of Systems   Review of Systems  Cardiovascular:  Positive for palpitations.    Physical Exam Updated Vital Signs BP (!) 144/55   Pulse 64   Temp (!) 97.5 F (36.4 C)   Resp (!) 21   Ht 5' 9.5" (1.765 m)   Wt 99.8 kg   SpO2 100%   BMI 32.02 kg/m  Physical Exam Vitals and nursing note reviewed.  Constitutional:      General:  She is not in acute distress.    Appearance: She is well-developed. She is not diaphoretic.  HENT:     Head: Normocephalic and atraumatic.     Right Ear: External ear normal.     Left Ear: External ear normal.     Nose: Nose normal.     Mouth/Throat:     Mouth: Mucous membranes are moist.  Eyes:     General: No scleral icterus.    Extraocular Movements: Extraocular movements intact.     Conjunctiva/sclera: Conjunctivae normal.     Pupils: Pupils are equal, round, and reactive to light.  Cardiovascular:     Rate and Rhythm: Normal rate and regular rhythm.     Heart sounds: Normal heart sounds. No murmur heard.    No friction rub. No  gallop.  Pulmonary:     Effort: Pulmonary effort is normal. No respiratory distress.     Breath sounds: Normal breath sounds.  Abdominal:     General: Bowel sounds are normal. There is no distension.     Palpations: Abdomen is soft. There is no mass.     Tenderness: There is no abdominal tenderness. There is no guarding.  Musculoskeletal:     Cervical back: Normal range of motion.     Right lower leg: Edema present.     Left lower leg: Edema present.  Skin:    General: Skin is warm and dry.  Neurological:     Mental Status: She is alert and oriented to person, place, and time.  Psychiatric:        Behavior: Behavior normal.     ED Results / Procedures / Treatments   Labs (all labs ordered are listed, but only abnormal results are displayed) Labs Reviewed  BASIC METABOLIC PANEL WITH GFR - Abnormal; Notable for the following components:      Result Value   Chloride 112 (*)    CO2 16 (*)    Glucose, Bld 186 (*)    BUN 56 (*)    Creatinine, Ser 1.77 (*)    GFR, Estimated 29 (*)    All other components within normal limits  CBC WITH DIFFERENTIAL/PLATELET - Abnormal; Notable for the following components:   RBC 3.50 (*)    Hemoglobin 10.2 (*)    HCT 32.7 (*)    All other components within normal limits  TSH  TROPONIN I (HIGH SENSITIVITY)    EKG EKG Interpretation Date/Time:  Monday Oct 26 2023 08:59:19 EDT Ventricular Rate:  56 PR Interval:  207 QRS Duration:  97 QT Interval:  427 QTC Calculation: 413 R Axis:   33  Text Interpretation: Sinus rhythm Atrial premature complex Low voltage, precordial leads Abnormal R-wave progression, early transition Confirmed by Dorenda Gandy [phone]) on 10/26/2023 11:17:21 AM  Radiology No results found.  Procedures Procedures    Medications Ordered in ED Medications - No data to display  ED Course/ Medical Decision Making/ A&P                                 Medical Decision Making Amount and/or Complexity of Data  Reviewed Labs: ordered.   This patient presents to the ED for concern of palpitations, this involves an extensive number of treatment options, and is a complaint that carries with it a high risk of complications and morbidity.  The differential diagnosis for palpitations includes cardiac arrhythmias, PVC/PAC, ACS, Cardiomyopathy, CHF, MVP, pericarditis, valvular disease, Panic/Anxiety,  Somatic disorder, ETOH, Caffeine,  Stimulant use, medication side effect, Anemia, Hyperthyroidism, pulmonary embolism.   Co morbidities:   has a past medical history of Arthritis, Back pain, Bilateral swelling of feet, Blood transfusion (1992), Cataract, CEREBRAL PALSY (07/11/2010), COLONIC POLYPS (07/11/2010), Constipation, Diabetes mellitus, Gallbladder problem, GERD (gastroesophageal reflux disease), HYPERLIPIDEMIA (11/29/2009), HYPERTENSION (11/29/2009), Incisional hernia, Kidney infection, Osteoarthritis, Osteomyelitis (HCC), PONV (postoperative nausea and vomiting), 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HCC), Shortness of breath, and Stroke (cerebrum) (HCC).   Social Determinants of Health:     SDOH Screenings   Food Insecurity: No Food Insecurity (08/12/2022)  Housing: Low Risk  (08/12/2022)  Transportation Needs: No Transportation Needs (04/15/2022)  Alcohol  Screen: Low Risk  (02/28/2020)  Depression (PHQ2-9): Low Risk  (04/15/2022)  Financial Resource Strain: Low Risk  (04/15/2022)  Physical Activity: Insufficiently Active (04/15/2022)  Social Connections: Socially Integrated (06/06/2021)  Stress: No Stress Concern Present (04/15/2022)  Tobacco Use: Low Risk  (10/26/2023)     Additional history:  Additional history obtained from ems at bedside   Lab Tests:  I Ordered, and personally interpreted labs.  The pertinent results include:   I reviewed the patient's labs.  Blood sugar 186.  Creatinine at baseline.  CBC with baseline hemoglobin and normocytic anemia at 10.2. Troponin within normal limits.  No active  chest pain, no palpitations.     Test Considered:   I considered evaluation for PE however patient has no active chest pain or shortness of breath.  She has a normal pulse rate.  She has chronic bilateral lower extremity edema and the right is always a little bit more swollen secondary to her history of cerebral palsy and hemiparesis on that side.  Do not think that this is warranted at this time  Critical Interventions:    Consultations Obtained:   Problem List / ED Course:     ICD-10-CM   1. Palpitations  R00.2       MDM:  Patient here for a brief episode of palpitations, no near syncope with shortness of breath or chest pain.  I evaluated the patient in the emergency department.  Labs are reassuring.  EKG shows sinus rhythm with atrial premature complexes.  This may be patient is feeling.  No evidence of atrial fibrillation or other serious arrhythmia.  She appears otherwise appropriate to follow-up with cardiology in the outpatient setting.  Discussed return precautions.          Final Clinical Impression(s) / ED Diagnoses Final diagnoses:  Palpitations    Rx / DC Orders ED Discharge Orders     None         Tama Fails, PA-C 10/26/23 1313    Dorenda Gandy, MD 10/27/23 [phone]

## 2023-10-26 NOTE — Discharge Instructions (Signed)
Contact a health care provider if: You continue to have a fast or irregular heartbeat for a long period of time. You notice that your palpitations occur more often. Get help right away if: You have chest pain or shortness of breath. You have a severe headache. You feel dizzy or you faint. These symptoms may represent a serious problem that is an emergency. Do not wait to see if the symptoms will go away. Get medical help right away. Call your local emergency services (911 in the [place] Do not drive yourself to the hospital.

## 2023-10-26 NOTE — ED Triage Notes (Signed)
 Patient presents to ed via [facility]  from home states she woke with palpitations at 0630  denies pain states she was treat for same 3 days ago . Patient also has a wound right lower ext that she is sefl treating.

## 2023-10-26 NOTE — ED Notes (Signed)
 Nt called [facility] tp put pt on monitor

## 2023-12-02 ENCOUNTER — Telehealth: Payer: Self-pay

## 2023-12-02 NOTE — Telephone Encounter (Signed)
 I spoke with the patient and advised she come in to see PCP in regards to concerns mentioned below and she reported she would call back to schedule an appointment. Patient's son was advised to contact mother to set up follow up with PCP to discuss.

## 2023-12-02 NOTE — Telephone Encounter (Signed)
 Left a message for the patient to return my call as I am unable to speak with son Royston Cornea as he is not listed on [facility]

## 2023-12-02 NOTE — Telephone Encounter (Signed)
 Copied from CRM [id number]. Topic: General - Other >> Dec 01, 2023  4:36 PM [place] wrote: Reason for CRM: son Royston Cornea has some concerns about mother- he was informed she has swelling in her foot and can't get her shoe on.  He is concerned about possible dementia- would like a phone call from Dr Darren Em- please call [phone]

## 2023-12-09 ENCOUNTER — Other Ambulatory Visit: Payer: Self-pay | Admitting: Family Medicine

## 2023-12-09 DIAGNOSIS — E781 Pure hyperglyceridemia: Secondary | ICD-10-CM

## 2023-12-09 DIAGNOSIS — I10 Essential (primary) hypertension: Secondary | ICD-10-CM

## 2023-12-21 ENCOUNTER — Encounter (HOSPITAL_BASED_OUTPATIENT_CLINIC_OR_DEPARTMENT_OTHER): Payer: Self-pay

## 2023-12-21 ENCOUNTER — Ambulatory Visit (INDEPENDENT_AMBULATORY_CARE_PROVIDER_SITE_OTHER): Payer: Self-pay | Admitting: Family Medicine

## 2023-12-21 ENCOUNTER — Ambulatory Visit: Payer: Self-pay | Admitting: Family Medicine

## 2023-12-21 ENCOUNTER — Encounter: Payer: Self-pay | Admitting: Family Medicine

## 2023-12-21 VITALS — BP 134/68 | HR 63 | Temp 98.0°F

## 2023-12-21 DIAGNOSIS — E785 Hyperlipidemia, unspecified: Secondary | ICD-10-CM | POA: Diagnosis not present

## 2023-12-21 DIAGNOSIS — N1832 Chronic kidney disease, stage 3b: Secondary | ICD-10-CM

## 2023-12-21 DIAGNOSIS — N184 Chronic kidney disease, stage 4 (severe): Secondary | ICD-10-CM | POA: Diagnosis not present

## 2023-12-21 DIAGNOSIS — E1122 Type 2 diabetes mellitus with diabetic chronic kidney disease: Secondary | ICD-10-CM

## 2023-12-21 DIAGNOSIS — R600 Localized edema: Secondary | ICD-10-CM

## 2023-12-21 LAB — COMPREHENSIVE METABOLIC PANEL WITH GFR
ALT: 11 U/L (ref 0–35)
AST: 11 U/L (ref 0–37)
Albumin: 3.8 g/dL (ref 3.5–5.2)
Alkaline Phosphatase: 93 U/L (ref 39–117)
BUN: 66 mg/dL — ABNORMAL HIGH (ref 6–23)
CO2: 19 meq/L (ref 19–32)
Calcium: 9 mg/dL (ref 8.4–10.5)
Chloride: 107 meq/L (ref 96–112)
Creatinine, Ser: 2.05 mg/dL — ABNORMAL HIGH (ref 0.40–1.20)
GFR: 22.63 mL/min — ABNORMAL LOW (ref >60.00)
Glucose, Bld: 136 mg/dL — ABNORMAL HIGH (ref 70–99)
Potassium: 4.5 meq/L (ref 3.5–5.1)
Sodium: 136 meq/L (ref 135–145)
Total Bilirubin: 0.4 mg/dL (ref 0.2–1.2)
Total Protein: 7.4 g/dL (ref 6.0–8.3)

## 2023-12-21 LAB — LIPID PANEL
Cholesterol: 148 mg/dL (ref 0–200)
HDL: 33 mg/dL — ABNORMAL LOW (ref >39.00)
LDL Cholesterol: 89 mg/dL (ref 0–99)
NonHDL: 115.07
Total CHOL/HDL Ratio: 4
Triglycerides: 130 mg/dL (ref 0.0–149.0)
VLDL: 26 mg/dL (ref 0.0–40.0)

## 2023-12-21 LAB — HEMOGLOBIN A1C: Hgb A1c MFr Bld: 8.2 % — ABNORMAL HIGH (ref 4.6–6.5)

## 2023-12-21 NOTE — Patient Instructions (Signed)
 I have ordered venous doppler to rule out DVT  Elevate leg as much as possible  Watch for any progressive redness or warmth.

## 2023-12-21 NOTE — Progress Notes (Signed)
 Established Patient Office Visit  Subjective   Patient ID: Jordan [name], female    DOB: [birth year]  Age: 78 y.o. MRN: [id number]  No chief complaint on file.   HPI   Jordan [name] has history of hypertension, venous stasis lower extremities, type 2 diabetes, chronic kidney disease, history of kidney stones, history of total cystectomy, history of cerebral palsy.  Essentially nonambulatory.  She is seen today with chronic edema lower extremities but especially past couple weeks right greater than left.  She states that chronically her right has been more swollen than the left but much worse past couple weeks.  Denies any pain in her calf or thigh.  No known or reported history of DVT.  No chest pains or increased dyspnea.  Currently on furosemide  80 mg daily.  She has support stockings left lower extremity but unable to get on the right recently.  No open ulcerations.  No fevers or chills.  She does take some gabapentin  but relatively low dose.  Type 2 diabetes treated with Trulicity .  No recent A1c.  Also takes simvastatin  40 mg daily with no recent lipid or hepatic panel.  She has hypertension treated with losartan  100 mg daily  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   PT CAN TRANSFER BED TO CHAIR--CAN STAND BRIEFLY--BUT NOT ABLE TO WALK; RT LEG HYPEREXTENDS BACKWARD AND IS WEAK-HAS A POWER CHAIR   COLONIC POLYPS 07/11/2010   Constipation    Diabetes mellitus    ORAL MED FOR DIABETES   Gallbladder problem    GERD (gastroesophageal reflux disease)    PAST HISTORY GERD--WATCHES DIET - NOT ON ANY MEDS FOR GERD   HYPERLIPIDEMIA 11/29/2009   HYPERTENSION 11/29/2009   Incisional hernia    NO PAIN-NOT CAUSING ANY DISCOMFORT   Kidney infection    Osteoarthritis    Osteomyelitis (HCC)    PONV (postoperative nausea and vomiting)     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HCC)    11-28-14 remains   Shortness of breath    Stroke (cerebrum) (HCC)    Past Surgical History:  Procedure Laterality Date   1992     ABDOMINAL HYSTERECTOMY  1990   partial   ACHILLES TENDON LENGTHENING  1962   ADENOIDECTOMY  1950   APPENDECTOMY     CARPAL TUNNEL RELEASE  1983   CHOLECYSTECTOMY     COLONOSCOPY WITH PROPOFOL  N/A 12/07/2014   Procedure: COLONOSCOPY WITH PROPOFOL ;  Surgeon: Lupita FORBES Commander, MD;  Location: [facility];  Service: Endoscopy;  Laterality: N/A;   LEFT CARPAL TUNNEL RELEASE X 2     LUMBAR SYMPATHETECTOMIES X 2     MULTIPLE RIGHT ANKLE SURGERIES     NEPHROLITHOTOMY  10/01/2011   Procedure: NEPHROLITHOTOMY PERCUTANEOUS;  Surgeon: Garnette Shack, MD;  Location: [facility];  Service: Urology;  Laterality: Right;        PARTIAL AMPUTATION OF LEFT GREAT TOE     1985   radical cystectomy  1992   neurogenic bladder sec CP   REPAIR OF NASAL FRACTURE  1985   REVISION UROSTOMY CUTANEOUS  1992   RIGHT CARPAL TUNNEL RELEASE     RIGHT HIP FLEXION CONTRACTURE REPAIR  1986   RT ANKLE FUSION  1975   SEVERAL RT ANKLE SURGERIES PRIOR TO THE FUSION  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 She has never used smokeless tobacco. She reports that she does not drink alcohol  and does not use drugs. family history includes Alcoholism in her father and mother; Bladder Cancer in her father; Cancer in her father; Colon cancer (age of onset: 73) in her father; Colon polyps in her sister; Diabetes in her father; Emphysema in her father; Heart attack in her maternal grandfather; Heart disease in her father and maternal grandmother; Heart disease (age of onset: 73) in her mother; Hyperlipidemia in her father and mother; Hypertension in her mother; Liver disease in her father and mother; Sleep apnea in her  father. Allergies  Allergen Reactions   Morphine Shortness Of Breath   Onglyza [Saxagliptin ] Other (See Comments)    Headache within 45 minutes of taking this.   Baclofen     GI upset   Lisinopril Cough   Metoclopramide Hcl     Reglan, irritable   Penicillins      MAJOR CASE OF HIVES    Review of Systems  Constitutional:  Negative for chills, fever and malaise/fatigue.  Eyes:  Negative for blurred vision.  Respiratory:  Negative for cough, hemoptysis and shortness of breath.   Cardiovascular:  Positive for leg swelling. Negative for chest pain.  Neurological:  Negative for dizziness, weakness and headaches.      Objective:     BP 134/68 (BP Location: Left Arm, Patient Position: Sitting, Cuff Size: Large)   Pulse 63   Temp 98 F (36.7 C) (Oral)   SpO2 96%  BP Readings from Last 3 Encounters:  12/21/23 134/68  10/26/23 (!) 144/55  10/23/23 (!) 124/46   Wt Readings from Last 3 Encounters:  10/26/23 220 lb (99.8 kg)  03/04/23 198 lb 6.6 oz (90 kg)  01/21/23 200 lb (90.7 kg)      Physical Exam Vitals reviewed.  Constitutional:      General: She is not in acute distress.    Appearance: She is not ill-appearing.  Cardiovascular:     Rate and Rhythm: Normal rate and regular rhythm.  Pulmonary:     Effort: Pulmonary effort is normal.     Breath sounds: Normal breath sounds.  Musculoskeletal:     Right lower leg: Edema present.     Left lower leg: Edema present.     Comments: She has knee-high support stocking on left lower extremity.  Asymmetric edema with right lower extremity greater than left.  She has significant ichthyosis changes of the right leg.  Significant edema especially involving the foot ankle and lower leg but has edema all the way up to the thigh region right lower extremity.  No open ulcerations.  Neurological:     Mental Status: She is alert.      No results found for any visits on 12/21/23.    The ASCVD Risk score (Arnett DK, et al.,  2019) failed to calculate for the following reasons:   Risk score cannot be calculated because patient has a medical history suggesting prior/existing ASCVD    Assessment & Plan:   Problem List Items Addressed This Visit       Unprioritized   CKD (chronic kidney disease) stage 4, GFR 15-29 ml/min (HCC) - Primary   Relevant Orders   CMP  Type 2 diabetes mellitus with diabetic chronic kidney disease (HCC)   Relevant Orders   Hemoglobin A1c   Hyperlipidemia   Relevant Orders   Lipid panel   CMP   Other Visit Diagnoses       Edema of right lower extremity       Relevant Orders   VAS US  LOWER EXTREMITY VENOUS (DVT)     Patient is seen with multiple chronic medical problems as above.  She has asymmetric edema lower extremities right greater than left.  Increased risk for DVT with her immobility.  Recommend:  -Venous Dopplers right lower extremity to rule out DVT - Elevate leg as much as possible - Check additional labs today including A1c, CMP, lipid panel - If venous Dopplers negative consider change from furosemide  to torsemide with close monitoring  Return in about 2 weeks (around 01/04/2024).    Wolm Scarlet, MD

## 2023-12-22 ENCOUNTER — Telehealth: Payer: Self-pay

## 2023-12-22 MED ORDER — TRULICITY 1.5 MG/0.5ML ~~LOC~~ SOAJ: 1.5000 mg | SUBCUTANEOUS | 1 refills | Status: DC

## 2023-12-22 NOTE — Telephone Encounter (Signed)
 Noted   please see result note

## 2023-12-22 NOTE — Telephone Encounter (Signed)
 Copied from CRM [id number]. Topic: Clinical - Lab/Test Results >> Dec 22, 2023 10:48 AM Ernestene P wrote: Reason for CRM: Pt called to go over lab results - pt is ok with increasing trulicity  to 1.5 MG , prefer pharmacy - [id number] - [place], [place] - [address] AT [facility] OF [address] & [facility] [address]

## 2023-12-23 ENCOUNTER — Encounter (HOSPITAL_BASED_OUTPATIENT_CLINIC_OR_DEPARTMENT_OTHER): Payer: Self-pay

## 2023-12-25 ENCOUNTER — Ambulatory Visit (HOSPITAL_COMMUNITY): Admission: RE | Admit: 2023-12-25 | Payer: Self-pay | Source: Ambulatory Visit

## 2023-12-25 ENCOUNTER — Encounter (HOSPITAL_COMMUNITY): Payer: Self-pay

## 2023-12-25 ENCOUNTER — Telehealth: Payer: Self-pay

## 2023-12-25 NOTE — Telephone Encounter (Signed)
 Copied from CRM [id number]. Topic: Referral - Question >> Dec 24, 2023  5:09 PM Jordan [name] wrote: Reason for CRM: Patient is confirming that the referral is sent to the [facility], patient has bad parking experience at [facility]. Patient would like [name] to call back tomorrow to schedule the DVT screening.

## 2023-12-29 ENCOUNTER — Encounter (HOSPITAL_BASED_OUTPATIENT_CLINIC_OR_DEPARTMENT_OTHER): Payer: Self-pay | Admitting: Family Medicine

## 2024-01-04 ENCOUNTER — Ambulatory Visit: Payer: Self-pay | Admitting: Family Medicine

## 2024-01-06 ENCOUNTER — Inpatient Hospital Stay (HOSPITAL_COMMUNITY)
Admission: EM | Admit: 2024-01-06 | Discharge: 2024-01-09 | DRG: 638 | Disposition: A | Discharge status: Home Health | Attending: Internal Medicine | Admitting: Internal Medicine

## 2024-01-06 ENCOUNTER — Ambulatory Visit: Payer: Self-pay | Admitting: Family Medicine

## 2024-01-06 DIAGNOSIS — E66811 Obesity, class 1: Secondary | ICD-10-CM | POA: Diagnosis present

## 2024-01-06 DIAGNOSIS — Z7985 Long-term (current) use of injectable non-insulin antidiabetic drugs: Secondary | ICD-10-CM

## 2024-01-06 DIAGNOSIS — L98429 Non-pressure chronic ulcer of back with unspecified severity: Secondary | ICD-10-CM | POA: Diagnosis present

## 2024-01-06 DIAGNOSIS — Z8249 Family history of ischemic heart disease and other diseases of the circulatory system: Secondary | ICD-10-CM | POA: Diagnosis not present

## 2024-01-06 DIAGNOSIS — Z79899 Other long term (current) drug therapy: Secondary | ICD-10-CM | POA: Diagnosis not present

## 2024-01-06 DIAGNOSIS — Z825 Family history of asthma and other chronic lower respiratory diseases: Secondary | ICD-10-CM | POA: Diagnosis not present

## 2024-01-06 DIAGNOSIS — D631 Anemia in chronic kidney disease: Secondary | ICD-10-CM | POA: Diagnosis not present

## 2024-01-06 DIAGNOSIS — Z833 Family history of diabetes mellitus: Secondary | ICD-10-CM | POA: Diagnosis not present

## 2024-01-06 DIAGNOSIS — G809 Cerebral palsy, unspecified: Secondary | ICD-10-CM | POA: Diagnosis present

## 2024-01-06 DIAGNOSIS — E441 Mild protein-calorie malnutrition: Secondary | ICD-10-CM | POA: Diagnosis not present

## 2024-01-06 DIAGNOSIS — E1165 Type 2 diabetes mellitus with hyperglycemia: Secondary | ICD-10-CM | POA: Diagnosis present

## 2024-01-06 DIAGNOSIS — E1122 Type 2 diabetes mellitus with diabetic chronic kidney disease: Secondary | ICD-10-CM | POA: Diagnosis present

## 2024-01-06 DIAGNOSIS — E785 Hyperlipidemia, unspecified: Secondary | ICD-10-CM | POA: Diagnosis not present

## 2024-01-06 DIAGNOSIS — L89152 Pressure ulcer of sacral region, stage 2: Secondary | ICD-10-CM | POA: Diagnosis not present

## 2024-01-06 DIAGNOSIS — Z981 Arthrodesis status: Secondary | ICD-10-CM

## 2024-01-06 DIAGNOSIS — L03115 Cellulitis of right lower limb: Principal | ICD-10-CM | POA: Diagnosis present

## 2024-01-06 DIAGNOSIS — L304 Erythema intertrigo: Secondary | ICD-10-CM | POA: Diagnosis present

## 2024-01-06 DIAGNOSIS — Z89412 Acquired absence of left great toe: Secondary | ICD-10-CM

## 2024-01-06 DIAGNOSIS — K219 Gastro-esophageal reflux disease without esophagitis: Secondary | ICD-10-CM | POA: Diagnosis not present

## 2024-01-06 DIAGNOSIS — I129 Hypertensive chronic kidney disease with stage 1 through stage 4 chronic kidney disease, or unspecified chronic kidney disease: Secondary | ICD-10-CM | POA: Diagnosis present

## 2024-01-06 DIAGNOSIS — Z8673 Personal history of transient ischemic attack (TIA), and cerebral infarction without residual deficits: Secondary | ICD-10-CM

## 2024-01-06 DIAGNOSIS — Z885 Allergy status to narcotic agent status: Secondary | ICD-10-CM

## 2024-01-06 DIAGNOSIS — E11622 Type 2 diabetes mellitus with other skin ulcer: Secondary | ICD-10-CM | POA: Diagnosis not present

## 2024-01-06 DIAGNOSIS — Z6832 Body mass index (BMI) 32.0-32.9, adult: Secondary | ICD-10-CM

## 2024-01-06 DIAGNOSIS — Z83438 Family history of other disorder of lipoprotein metabolism and other lipidemia: Secondary | ICD-10-CM

## 2024-01-06 DIAGNOSIS — I10 Essential (primary) hypertension: Secondary | ICD-10-CM | POA: Diagnosis present

## 2024-01-06 DIAGNOSIS — N184 Chronic kidney disease, stage 4 (severe): Secondary | ICD-10-CM | POA: Diagnosis present

## 2024-01-06 DIAGNOSIS — E119 Type 2 diabetes mellitus without complications: Secondary | ICD-10-CM

## 2024-01-06 DIAGNOSIS — L97319 Non-pressure chronic ulcer of right ankle with unspecified severity: Secondary | ICD-10-CM | POA: Diagnosis not present

## 2024-01-06 DIAGNOSIS — Z888 Allergy status to other drugs, medicaments and biological substances status: Secondary | ICD-10-CM

## 2024-01-06 DIAGNOSIS — D649 Anemia, unspecified: Secondary | ICD-10-CM | POA: Diagnosis present

## 2024-01-06 DIAGNOSIS — Z8551 Personal history of malignant neoplasm of bladder: Secondary | ICD-10-CM

## 2024-01-06 DIAGNOSIS — Z880 Allergy status to penicillin: Secondary | ICD-10-CM

## 2024-01-06 DIAGNOSIS — E669 Obesity, unspecified: Secondary | ICD-10-CM | POA: Diagnosis present

## 2024-01-07 ENCOUNTER — Encounter (HOSPITAL_COMMUNITY): Payer: Self-pay

## 2024-01-07 ENCOUNTER — Emergency Department (HOSPITAL_COMMUNITY)

## 2024-01-07 ENCOUNTER — Inpatient Hospital Stay (HOSPITAL_COMMUNITY)

## 2024-01-07 ENCOUNTER — Other Ambulatory Visit: Payer: Self-pay

## 2024-01-07 DIAGNOSIS — N184 Chronic kidney disease, stage 4 (severe): Secondary | ICD-10-CM | POA: Diagnosis present

## 2024-01-07 DIAGNOSIS — L304 Erythema intertrigo: Secondary | ICD-10-CM | POA: Diagnosis present

## 2024-01-07 DIAGNOSIS — E119 Type 2 diabetes mellitus without complications: Secondary | ICD-10-CM | POA: Diagnosis not present

## 2024-01-07 DIAGNOSIS — E441 Mild protein-calorie malnutrition: Secondary | ICD-10-CM | POA: Diagnosis present

## 2024-01-07 DIAGNOSIS — L97309 Non-pressure chronic ulcer of unspecified ankle with unspecified severity: Secondary | ICD-10-CM | POA: Diagnosis not present

## 2024-01-07 DIAGNOSIS — Z79899 Other long term (current) drug therapy: Secondary | ICD-10-CM | POA: Diagnosis not present

## 2024-01-07 DIAGNOSIS — Z825 Family history of asthma and other chronic lower respiratory diseases: Secondary | ICD-10-CM | POA: Diagnosis not present

## 2024-01-07 DIAGNOSIS — M7989 Other specified soft tissue disorders: Secondary | ICD-10-CM | POA: Diagnosis not present

## 2024-01-07 DIAGNOSIS — L89152 Pressure ulcer of sacral region, stage 2: Secondary | ICD-10-CM | POA: Diagnosis present

## 2024-01-07 DIAGNOSIS — Z7985 Long-term (current) use of injectable non-insulin antidiabetic drugs: Secondary | ICD-10-CM | POA: Diagnosis not present

## 2024-01-07 DIAGNOSIS — K219 Gastro-esophageal reflux disease without esophagitis: Secondary | ICD-10-CM | POA: Diagnosis present

## 2024-01-07 DIAGNOSIS — M19071 Primary osteoarthritis, right ankle and foot: Secondary | ICD-10-CM | POA: Diagnosis not present

## 2024-01-07 DIAGNOSIS — Z83438 Family history of other disorder of lipoprotein metabolism and other lipidemia: Secondary | ICD-10-CM | POA: Diagnosis not present

## 2024-01-07 DIAGNOSIS — E669 Obesity, unspecified: Secondary | ICD-10-CM | POA: Diagnosis present

## 2024-01-07 DIAGNOSIS — Z6832 Body mass index (BMI) 32.0-32.9, adult: Secondary | ICD-10-CM | POA: Diagnosis not present

## 2024-01-07 DIAGNOSIS — E785 Hyperlipidemia, unspecified: Secondary | ICD-10-CM | POA: Diagnosis present

## 2024-01-07 DIAGNOSIS — L03115 Cellulitis of right lower limb: Secondary | ICD-10-CM | POA: Diagnosis present

## 2024-01-07 DIAGNOSIS — Z981 Arthrodesis status: Secondary | ICD-10-CM | POA: Diagnosis not present

## 2024-01-07 DIAGNOSIS — Z8249 Family history of ischemic heart disease and other diseases of the circulatory system: Secondary | ICD-10-CM | POA: Diagnosis not present

## 2024-01-07 DIAGNOSIS — L98429 Non-pressure chronic ulcer of back with unspecified severity: Secondary | ICD-10-CM | POA: Diagnosis present

## 2024-01-07 DIAGNOSIS — Z833 Family history of diabetes mellitus: Secondary | ICD-10-CM | POA: Diagnosis not present

## 2024-01-07 DIAGNOSIS — D631 Anemia in chronic kidney disease: Secondary | ICD-10-CM | POA: Diagnosis present

## 2024-01-07 DIAGNOSIS — Z8673 Personal history of transient ischemic attack (TIA), and cerebral infarction without residual deficits: Secondary | ICD-10-CM | POA: Diagnosis not present

## 2024-01-07 DIAGNOSIS — E11622 Type 2 diabetes mellitus with other skin ulcer: Secondary | ICD-10-CM | POA: Diagnosis present

## 2024-01-07 DIAGNOSIS — D649 Anemia, unspecified: Secondary | ICD-10-CM | POA: Diagnosis present

## 2024-01-07 DIAGNOSIS — L97319 Non-pressure chronic ulcer of right ankle with unspecified severity: Secondary | ICD-10-CM | POA: Diagnosis present

## 2024-01-07 DIAGNOSIS — I129 Hypertensive chronic kidney disease with stage 1 through stage 4 chronic kidney disease, or unspecified chronic kidney disease: Secondary | ICD-10-CM | POA: Diagnosis present

## 2024-01-07 DIAGNOSIS — R600 Localized edema: Secondary | ICD-10-CM | POA: Diagnosis not present

## 2024-01-07 DIAGNOSIS — E1122 Type 2 diabetes mellitus with diabetic chronic kidney disease: Secondary | ICD-10-CM | POA: Diagnosis present

## 2024-01-07 DIAGNOSIS — G809 Cerebral palsy, unspecified: Secondary | ICD-10-CM | POA: Diagnosis present

## 2024-01-07 DIAGNOSIS — E1165 Type 2 diabetes mellitus with hyperglycemia: Secondary | ICD-10-CM | POA: Diagnosis present

## 2024-01-07 LAB — C-REACTIVE PROTEIN: CRP: 3.2 mg/dL — ABNORMAL HIGH (ref <1.0)

## 2024-01-07 LAB — CBC WITH DIFFERENTIAL/PLATELET
Abs Immature Granulocytes: 0.03 K/uL (ref 0.00–0.07)
Basophils Absolute: 0 K/uL (ref 0.0–0.1)
Basophils Relative: 1 %
Eosinophils Absolute: 0.3 K/uL (ref 0.0–0.5)
Eosinophils Relative: 3 %
HCT: 32.7 % — ABNORMAL LOW (ref 36.0–46.0)
Hemoglobin: 9.5 g/dL — ABNORMAL LOW (ref 12.0–15.0)
Immature Granulocytes: 0 %
Lymphocytes Relative: 21 %
Lymphs Abs: 1.8 K/uL (ref 0.7–4.0)
MCH: 26.5 pg (ref 26.0–34.0)
MCHC: 29.1 g/dL — ABNORMAL LOW (ref 30.0–36.0)
MCV: 91.3 fL (ref 80.0–100.0)
Monocytes Absolute: 0.5 K/uL (ref 0.1–1.0)
Monocytes Relative: 6 %
Neutro Abs: 6 K/uL (ref 1.7–7.7)
Neutrophils Relative %: 69 %
Platelets: 394 K/uL (ref 150–400)
RBC: 3.58 MIL/uL — ABNORMAL LOW (ref 3.87–5.11)
RDW: 14.2 % (ref 11.5–15.5)
WBC: 8.8 K/uL (ref 4.0–10.5)
nRBC: 0 % (ref 0.0–0.2)

## 2024-01-07 LAB — COMPREHENSIVE METABOLIC PANEL WITH GFR
ALT: 12 U/L (ref 0–44)
AST: 10 U/L — ABNORMAL LOW (ref 15–41)
Albumin: 3.4 g/dL — ABNORMAL LOW (ref 3.5–5.0)
Alkaline Phosphatase: 109 U/L (ref 38–126)
Anion gap: 8 (ref 5–15)
BUN: 55 mg/dL — ABNORMAL HIGH (ref 8–23)
CO2: 17 mmol/L — ABNORMAL LOW (ref 22–32)
Calcium: 8.8 mg/dL — ABNORMAL LOW (ref 8.9–10.3)
Chloride: 110 mmol/L (ref 98–111)
Creatinine, Ser: 2.2 mg/dL — ABNORMAL HIGH (ref 0.44–1.00)
GFR, Estimated: 22 mL/min — ABNORMAL LOW (ref >60)
Glucose, Bld: 165 mg/dL — ABNORMAL HIGH (ref 70–99)
Potassium: 4.2 mmol/L (ref 3.5–5.1)
Sodium: 135 mmol/L (ref 135–145)
Total Bilirubin: 0.7 mg/dL (ref 0.0–1.2)
Total Protein: 7.1 g/dL (ref 6.5–8.1)

## 2024-01-07 LAB — GLUCOSE, CAPILLARY
Glucose-Capillary: 170 mg/dL — ABNORMAL HIGH (ref 70–99)
Glucose-Capillary: 158 mg/dL — ABNORMAL HIGH (ref 70–99)
Glucose-Capillary: 174 mg/dL — ABNORMAL HIGH (ref 70–99)

## 2024-01-07 LAB — BETA-HYDROXYBUTYRIC ACID: Beta-Hydroxybutyric Acid: 0.07 mmol/L (ref 0.05–0.27)

## 2024-01-07 LAB — CBG MONITORING, ED: Glucose-Capillary: 137 mg/dL — ABNORMAL HIGH (ref 70–99)

## 2024-01-07 LAB — SEDIMENTATION RATE: Sed Rate: 82 mm/h — ABNORMAL HIGH (ref 0–22)

## 2024-01-07 LAB — PREALBUMIN: Prealbumin: 16 mg/dL — ABNORMAL LOW (ref 18–38)

## 2024-01-07 LAB — I-STAT CG4 LACTIC ACID, ED: Lactic Acid, Venous: 1.2 mmol/L (ref 0.5–1.9)

## 2024-01-07 MED ORDER — SODIUM CHLORIDE 0.9 % IV BOLUS
1000.0000 mL | Freq: Once | INTRAVENOUS | Status: AC
  Administered 2024-01-07: 1000 mL via INTRAVENOUS

## 2024-01-07 MED ORDER — GABAPENTIN 100 MG PO CAPS: 100.0000 mg | ORAL_CAPSULE | Freq: Every evening | ORAL | Status: DC | PRN

## 2024-01-07 MED ORDER — SODIUM CHLORIDE 0.9 % IV SOLN
2.0000 g | Freq: Once | INTRAVENOUS | Status: AC
  Administered 2024-01-07: 2 g via INTRAVENOUS
  Filled 2024-01-07: qty 12.5

## 2024-01-07 MED ORDER — GERHARDT'S BUTT CREAM
TOPICAL_CREAM | Freq: Two times a day (BID) | CUTANEOUS | Status: DC
  Administered 2024-01-09: 1 via TOPICAL
  Filled 2024-01-07 (×2): qty 60

## 2024-01-07 MED ORDER — FUROSEMIDE 40 MG PO TABS
80.0000 mg | ORAL_TABLET | Freq: Every day | ORAL | Status: DC
Start: 2024-01-07 — End: 2024-01-09
  Administered 2024-01-07 – 2024-01-09 (×3): 80 mg via ORAL
  Filled 2024-01-07 (×3): qty 2

## 2024-01-07 MED ORDER — JUVEN PO PACK
1.0000 | PACK | Freq: Two times a day (BID) | ORAL | Status: DC
  Administered 2024-01-07 – 2024-01-09 (×4): 1 via ORAL
  Filled 2024-01-07 (×4): qty 1

## 2024-01-07 MED ORDER — VANCOMYCIN HCL 1250 MG/250ML IV SOLN
1250.0000 mg | INTRAVENOUS | Status: DC
  Administered 2024-01-09: 1250 mg via INTRAVENOUS
  Filled 2024-01-07: qty 250

## 2024-01-07 MED ORDER — ONDANSETRON HCL 4 MG/2ML IJ SOLN
4.0000 mg | Freq: Four times a day (QID) | INTRAMUSCULAR | Status: DC | PRN
  Filled 2024-01-07: qty 2

## 2024-01-07 MED ORDER — LOSARTAN POTASSIUM 50 MG PO TABS
100.0000 mg | ORAL_TABLET | Freq: Every day | ORAL | Status: DC
  Administered 2024-01-07 – 2024-01-09 (×3): 100 mg via ORAL
  Filled 2024-01-07 (×3): qty 2

## 2024-01-07 MED ORDER — VANCOMYCIN HCL IN DEXTROSE 1-5 GM/200ML-% IV SOLN
1000.0000 mg | Freq: Once | INTRAVENOUS | Status: AC
  Administered 2024-01-07: 1000 mg via INTRAVENOUS
  Filled 2024-01-07: qty 200

## 2024-01-07 MED ORDER — METRONIDAZOLE 500 MG/100ML IV SOLN
500.0000 mg | Freq: Two times a day (BID) | INTRAVENOUS | Status: DC
  Administered 2024-01-07 – 2024-01-09 (×5): 500 mg via INTRAVENOUS
  Filled 2024-01-07 (×5): qty 100

## 2024-01-07 MED ORDER — ACETAMINOPHEN 650 MG RE SUPP: 650.0000 mg | Freq: Four times a day (QID) | RECTAL | Status: DC | PRN

## 2024-01-07 MED ORDER — ADULT MULTIVITAMIN W/MINERALS CH
1.0000 | ORAL_TABLET | Freq: Every day | ORAL | Status: DC
  Administered 2024-01-08 – 2024-01-09 (×2): 1 via ORAL
  Filled 2024-01-07 (×2): qty 1

## 2024-01-07 MED ORDER — NYSTATIN 100000 UNIT/GM EX POWD
Freq: Three times a day (TID) | CUTANEOUS | Status: DC
  Filled 2024-01-07 (×2): qty 15

## 2024-01-07 MED ORDER — ZINC SULFATE 220 (50 ZN) MG PO CAPS
220.0000 mg | ORAL_CAPSULE | Freq: Every day | ORAL | Status: DC
  Administered 2024-01-08 – 2024-01-09 (×2): 220 mg via ORAL
  Filled 2024-01-07 (×2): qty 1

## 2024-01-07 MED ORDER — ONDANSETRON HCL 4 MG PO TABS: 4.0000 mg | ORAL_TABLET | Freq: Four times a day (QID) | ORAL | Status: DC | PRN

## 2024-01-07 MED ORDER — INSULIN ASPART 100 UNIT/ML IJ SOLN
0.0000 [IU] | Freq: Three times a day (TID) | INTRAMUSCULAR | Status: DC
  Administered 2024-01-07 (×2): 2 [IU] via SUBCUTANEOUS
  Administered 2024-01-07: 1 [IU] via SUBCUTANEOUS
  Administered 2024-01-08 (×2): 2 [IU] via SUBCUTANEOUS
  Administered 2024-01-09 (×2): 1 [IU] via SUBCUTANEOUS
  Filled 2024-01-07: qty 0.09

## 2024-01-07 MED ORDER — SODIUM CHLORIDE 0.9 % IV SOLN
2.0000 g | INTRAVENOUS | Status: DC
  Administered 2024-01-07 – 2024-01-08 (×2): 2 g via INTRAVENOUS
  Filled 2024-01-07 (×2): qty 20

## 2024-01-07 MED ORDER — SIMVASTATIN 40 MG PO TABS
40.0000 mg | ORAL_TABLET | Freq: Every evening | ORAL | Status: DC
  Administered 2024-01-07 – 2024-01-08 (×2): 40 mg via ORAL
  Filled 2024-01-07 (×2): qty 1

## 2024-01-07 MED ORDER — ACETAMINOPHEN 325 MG PO TABS: 650.0000 mg | ORAL_TABLET | Freq: Four times a day (QID) | ORAL | Status: DC | PRN

## 2024-01-07 NOTE — ED Notes (Signed)
 Wrapped PT right foot using 6 non stick pads, 3x 4X4 gauze, and 1 gauze fluff roll

## 2024-01-07 NOTE — Progress Notes (Signed)
         Wound next to urostomy

## 2024-01-07 NOTE — Progress Notes (Signed)
 IVT consult placed for PIV access. USGPIV placed however limited peripheral options were observed with US . If patient is going to require long term IV abx, please consider PICC line sooner rather than later to preserve vasculature.   Zorah Backes R Skyelar Halliday, RN

## 2024-01-07 NOTE — Consult Note (Signed)
 WOC Nurse Consult Note: Reason for Consult: Moisture damage groin, abdomen/buttocks  Wound type: 1.  Intertriginous dermatitis underneath pannus and in groin ICD-10 CM Codes for Irritant Dermatitis  L30.4  - Erythema intertrigo. Also used for abrasion of the hand, chafing of the skin, dermatitis due to sweating and friction, friction dermatitis, friction eczema, and genital/thigh intertrigo.   2.  Moisture Associated Skin Damage to sacrum/coccyx/buttocks  ICD-10 CM Codes for Irritant Dermatitis  L24A2 - Due to fecal, urinary or dual incontinence  L30.4  - Erythema intertrigo. Also used for abrasion of the hand, chafing of the skin, dermatitis due to sweating and friction, friction dermatitis, friction eczema, and genital/thigh intertrigo.  3.  Stage 2 Pressure Injury buttocks  Pressure Injury POA: Yes Measurement: see nursing flowsheet  Wound bed: erythema with scattered partial thickness skin loss underneath pannus/abdominal folds/groin/buttocks/sacrum  Stage 2 Pressure Injury buttocks red moist  Drainage (amount, consistency, odor)  Periwound: erythema  Dressing procedure/placement/frequency:  Cleanse underneath pannus/abdominal folds with soap and water, do not rinse and allow to air dry. May sprinkle area with floor stock antifungal powder (white and green label Microguard) then apply Interdry [employer] # [phone] Measure and cut length of InterDry to fit in skin folds that have skin breakdown Tuck InterDry fabric into skin folds in a single layer, allow for 2 inches of overhang from skin edges to allow for wicking to occur May remove to bathe; dry area thoroughly and then tuck into affected areas again Do not apply any creams or ointments when using InterDry DO NOT THROW AWAY FOR 5 DAYS unless soiled with stool DO NOT [facility] product, this will inactivate the silver in the material  New sheet of Interdry should be applied after 5 days of use if patient continues to have skin breakdown   Cleanse groin/inner thighs with soap and water, dry and apply Gerhardt's Butt Cream 2 times a day and prn soiling.  Cleanse coccyx/buttocks with soap and water, dry and apply silver hydrofiber Soila [phone]) to open areas daily.  Apply Gerhardt's to surrounding moisture damaged skin 2 times daily and prn soiling.  Cover entire area with ABD pad and tape.    POC discussed with bedside nurse.  See prior [facility] consult note for leg and supply orders for ileal conduit.   WOC team will not follow. Re-consult if further needs arise.   Thank you,    Powell Bar MSN, RN-BC, [employer]

## 2024-01-07 NOTE — TOC Initial Note (Signed)
 Transition of Care [facility]) - Initial/Assessment Note    Patient Details  Name: Jordan [name] MRN: [id number] Date of Birth: 1946-02-26  [facility]) CM/SW Contact:    NORMAN ASPEN, LCSW Phone Number: 01/07/2024, 2:04 PM  Clinical Narrative:                 Met with pt and spouse today to discuss possible dc needs.  Pt reports that she and spouse are living in an w/c accessible condo.  She uses a power wheelchair for all mobility and was independent with her transfers in and out of w/c.  Spouse is only support and she was not receiving any HH services.  At this point, medical work up still underway but have explained to pt that she may need to [facility] support at dc.  She is agreeable to [facility] continuing to follow along for dc needs.  Expected Discharge Plan: Home w Home Health Services Barriers to Discharge: Continued Medical Work up   Patient Goals and CMS Choice Patient states their goals for this hospitalization and ongoing recovery are:: return home          Expected Discharge Plan and Services In-house Referral: Clinical Social Work     Living arrangements for the past 2 months: Single Family Home                                      Prior Living Arrangements/Services Living arrangements for the past 2 months: Single Family Home Lives with:: Spouse Patient language and need for interpreter reviewed:: Yes Do you feel safe going back to the place where you live?: Yes      Need for Family Participation in Patient Care: Yes (Comment) Care giver support system in place?: Yes (comment) Current home services: DME Criminal Activity/Legal Involvement Pertinent to Current Situation/Hospitalization: No - Comment as needed  Activities of Daily Living   ADL Screening (condition at time of admission) Independently performs ADLs?: No Does the patient have a NEW difficulty with bathing/dressing/toileting/self-feeding that is expected to last >3 days?: No Does the patient  have a NEW difficulty with getting in/out of bed, walking, or climbing stairs that is expected to last >3 days?: No Does the patient have a NEW difficulty with communication that is expected to last >3 days?: No Is the patient deaf or have difficulty hearing?: Yes Does the patient have difficulty seeing, even when wearing glasses/contacts?: No Does the patient have difficulty concentrating, remembering, or making decisions?: No  Permission Sought/Granted Permission sought to share information with : Family Supports Permission granted to share information with : Yes, Verbal Permission Granted  Share Information with NAME: spouse, Alika Eppes @ [phone]           Emotional Assessment Appearance:: Appears stated age Attitude/Demeanor/Rapport: Gracious, Engaged Affect (typically observed): Accepting Orientation: : Oriented to Self, Oriented to Place, Oriented to  Time, Oriented to Situation Alcohol  / Substance Use: Not Applicable Psych Involvement: No (comment)  Admission diagnosis:  Diabetic ulcer of ankle (HCC) [Z88.377, L97.309] Cellulitis of right lower extremity [L03.115] Patient Active Problem List   Diagnosis Date Noted   Diabetic ulcer of ankle (HCC) 01/07/2024   Normocytic anemia 01/07/2024   Mild protein malnutrition (HCC) 01/07/2024   CKD (chronic kidney disease) stage 4, GFR 15-29 ml/min (HCC) 01/21/2023   Skin inflammation at urostomy site [facility]) 12/22/2022   Non-recurrent bilateral inguinal hernia without obstruction or  gangrene 07/15/2022   Irritant contact dermatitis associated with urinary stoma 07/02/2022   Complication of urostomy (HCC) 07/02/2022   Obesity (BMI 30.0-34.9) 04/26/2019   Venous stasis of both lower extremities 04/26/2019   Leg wound, right, subsequent encounter 12/01/2016   Hx of colonic polyps    Type 2 diabetes mellitus with diabetic chronic kidney disease (HCC) 08/04/2014   Pyelonephritis 07/08/2014   History of kidney stones 06/12/2014    Hypercalcemia 06/12/2014   Sepsis secondary to UTI (HCC) 12/03/2011   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   Type 2 diabetes mellitus (HCC) 10/09/2010   Infantile cerebral palsy (HCC) 07/11/2010   Hyperlipidemia 11/29/2009   Essential hypertension 11/29/2009   PCP:  Micheal Wolm ORN, MD Pharmacy:   [facility] [phone] - [place], [place] - [address] AT [facility] OF [address] & [facility] [address] [place] [address] Phone: [phone] Fax: [phone]  [facility] [address] [place] [address] Phone: [phone] Fax: [phone]     Social Drivers of Health (SDOH) Social History: SDOH Screenings   Food Insecurity: No Food Insecurity (01/07/2024)  Housing: Low Risk  (01/07/2024)  Transportation Needs: No Transportation Needs (01/07/2024)  Utilities: Not At Risk (01/07/2024)  Alcohol  Screen: Low Risk  (02/28/2020)  Depression (PHQ2-9): Low Risk  (04/15/2022)  Financial Resource Strain: Low Risk  (04/15/2022)  Physical Activity: Insufficiently Active (04/15/2022)  Social Connections: Socially Integrated (01/07/2024)  Stress: No Stress Concern Present (04/15/2022)  Tobacco Use: Low Risk  (01/07/2024)   SDOH Interventions:     Readmission Risk Interventions    01/07/2024    2:02 PM  Readmission Risk Prevention Plan  Transportation Screening Complete  PCP or Specialist Appt within 5-7 Days Complete  Home Care Screening Complete  Medication Review (RN CM) Complete

## 2024-01-07 NOTE — Progress Notes (Signed)
 Initial Nutrition Assessment  DOCUMENTATION CODES:   Obesity unspecified  INTERVENTION:  - Heart Healthy/Carb Modified diet per MD.  - Diet education with handout provided for nutrition therapy for wound healing.   - Add 1 packet Juven BID, each packet provides 95 calories, 2.5 grams of protein (collagen), and 9.8 grams of carbohydrate (3 grams sugar); also contains 7 grams of L-arginine and L-glutamine, 300 mg vitamin C, 15 mg vitamin E, 1.2 mcg vitamin B-12, 9.5 mg zinc , 200 mg calcium, and 1.5 g  Calcium Beta-hydroxy-Beta-methylbutyrate to support wound healing  - Add Multivitamin with minerals daily and 220mg  zinc  x14 days to promote wound healing.    NUTRITION DIAGNOSIS:   Increased nutrient needs related to wound healing as evidenced by estimated needs.  GOAL:   Patient will meet greater than or equal to 90% of their needs  MONITOR:   PO intake, Supplement acceptance  REASON FOR ASSESSMENT:   Consult Wound healing  ASSESSMENT:   78 y.o female with PMH DM2 who presented with diabetic right ankle ulcer.   Patient reports a UBW of 195# and denies any significant changes in weight recently.  Per EMR, weight stable over the past year. However, weight taken this admission noted to be copied from weight taken >2 months ago in May. Ordered a new weight.  Patient endorses typically consuming 3 meals a day at home and that she was eating normally up until admission.  Current appetite is normal as well and she reports having had a good breakfast.  Provided patient with wound healing diet education and reviewed. Discussed increased protein needs and reviewed protein rich food sources with patient. She enjoys most meats as well as yogurt. Also reviewed importance of blood sugar control to support healing.  Patient endorsed understanding of information.   No skin assessment yet completed but new note this afternoon from RN showing several wounds. Will order Juven and wound  healing vitamins/minerals.    Medications reviewed and include: -  Labs reviewed:  Creatinine 2.20 HA1C 8.2 (as of 12/21/23)   NUTRITION - FOCUSED PHYSICAL EXAM:  Flowsheet Row Most Recent Value  Orbital Region No depletion  Upper Arm Region No depletion  Thoracic and Lumbar Region No depletion  Buccal Region No depletion  Temple Region No depletion  Clavicle Bone Region No depletion  Clavicle and Acromion Bone Region No depletion  Scapular Bone Region Unable to assess  Dorsal Hand No depletion  Patellar Region No depletion  Anterior Thigh Region No depletion  Posterior Calf Region No depletion  Edema (RD Assessment) None  Hair Reviewed  Eyes Reviewed  Mouth Reviewed  Skin Reviewed  Nails Reviewed    Diet Order:   Diet Order             Diet heart healthy/carb modified Fluid consistency: Thin  Diet effective now                   EDUCATION NEEDS:  Education needs have been addressed  Skin:    RN has not completed assessment yet  Last BM:  unknown  Height:  Ht Readings from Last 1 Encounters:  10/26/23 5' 9.5 (1.765 m)   Weight:  Wt Readings from Last 1 Encounters:  01/06/24 99.8 kg    BMI:  Body mass index is 32.03 kg/m.  Estimated Nutritional Needs:  Kcal:  1850-2000 kcals Protein:  80-95 grams Fluid:  >/= 1.9L    Trude Ned RD, LDN Contact via Secure Chat.

## 2024-01-07 NOTE — H&P (Signed)
 History and Physical    Patient: Jordan [name] FMW:[id number] DOB: 1945-08-26 DOA: 01/06/2024 DOS: the patient was seen and examined on 01/07/2024 PCP: Micheal Wolm ORN, MD  Patient coming from: Home  Chief Complaint:  Chief Complaint  Patient presents with   Foot Pain   HPI: Jordan [name] is a 78 y.o. female with medical history significant of osteoarthritis, back pain, bilateral lower extremity edema, cataracts, cerebral palsy, colon polyps, constipation, type 2 diabetes, gallbladder disease, GERD, hyperlipidemia, hypertension, incisional hernia, pyelonephritis, osteoarthritis, history of osteomyelitis, left great toe amputation, history of CVA, bladder cancer, cystectomy, ileal conduit who presented to the emergency department with complaints of right ankle pain in the setting of multiple ulcerations associated with edema, erythema, calor and tenderness.  She denied fever, chills, rhinorrhea, sore throat, wheezing or hemoptysis.  No chest pain, palpitations, diaphoresis, PND, orthopnea, but has frequent pitting edema of the lower extremities.  No abdominal pain, nausea, emesis, diarrhea, constipation, melena or hematochezia.  No flank pain or hematuria.  No polyuria, polydipsia, polyphagia or blurred vision.   Lab work: CBC showed a white count of 8.8, hemoglobin 9.5 g/dL platelets 605.  ESR was 82 mm/hr.  CRP 3.2 mg/dL and prealbumin 16 mg/dL.  Lactic acid normal.  Unremarkable beta hydroxybutyric acid.  CMP showed a CO2 of 17 mmol/L with a normal anion gap, the rest of the electrolytes are normal after calcium was corrected.  Glucose under 65, BUN 55 creatinine 2.20 mg/dL.  Albumin was 3.4 g/dL and AST 10 units/L, the rest of the hepatic functions were normal.  Imaging: Right ankle x-ray shows sclerosis deformity involving the distal tibia with tibial talar fusion, posttraumatic, with indwelling screw.  Superimposed infection/chronic osteomyelitis is difficult to exclude.   ED course: Initial  vital signs were temperature 98.8 F, pulse 75, respirations 16, BP 127/55 mmHg O2 sat 100% on room air.  The patient received cefepime  2 g IVPB, vancomycin  2000 mg IVPB and normal saline 1000 mL bolus.  Review of Systems: As mentioned in the history of present illness. All other systems reviewed and are negative.  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   PT CAN TRANSFER BED TO CHAIR--CAN STAND BRIEFLY--BUT NOT ABLE TO WALK; RT LEG HYPEREXTENDS BACKWARD AND IS WEAK-HAS A POWER CHAIR   COLONIC POLYPS 07/11/2010   Constipation    Diabetes mellitus    ORAL MED FOR DIABETES   Gallbladder problem    GERD (gastroesophageal reflux disease)    PAST HISTORY GERD--WATCHES DIET - NOT ON ANY MEDS FOR GERD   HYPERLIPIDEMIA 11/29/2009   HYPERTENSION 11/29/2009   Incisional hernia    NO PAIN-NOT CAUSING ANY DISCOMFORT   Kidney infection    Osteoarthritis    Osteomyelitis (HCC)    PONV (postoperative nausea and vomiting)    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HCC)    11-28-14 remains   Shortness of breath    Stroke (cerebrum) (HCC)    Past Surgical History:  Procedure Laterality Date   1992     ABDOMINAL HYSTERECTOMY  1990   partial   ACHILLES TENDON LENGTHENING  1962   ADENOIDECTOMY  1950   APPENDECTOMY     CARPAL TUNNEL RELEASE  1983   CHOLECYSTECTOMY     COLONOSCOPY WITH PROPOFOL  N/A 12/07/2014  Procedure: COLONOSCOPY WITH PROPOFOL ;  Surgeon: Lupita FORBES Commander, MD;  Location: [facility];  Service: Endoscopy;  Laterality: N/A;   LEFT CARPAL TUNNEL RELEASE X 2     LUMBAR SYMPATHETECTOMIES X 2     MULTIPLE RIGHT ANKLE SURGERIES     NEPHROLITHOTOMY  10/01/2011   Procedure: NEPHROLITHOTOMY PERCUTANEOUS;  Surgeon: Garnette Shack, MD;  Location: [facility];  Service: Urology;  Laterality: Right;        PARTIAL AMPUTATION OF LEFT GREAT TOE      1985   radical cystectomy  1992   neurogenic bladder sec CP   REPAIR OF NASAL FRACTURE  1985   REVISION UROSTOMY CUTANEOUS  1992   RIGHT CARPAL TUNNEL RELEASE     RIGHT HIP FLEXION CONTRACTURE REPAIR  1986   RT ANKLE FUSION  1975   SEVERAL RT ANKLE SURGERIES PRIOR TO THE FUSION   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--AT [facility]      TONSILLECTOMY     TUBAL LIGATION  1975   VAGINAL HYSTERECTOMY  1990   WISDOM TOOTH EXTRACTION  1971   Social History:  reports that she has never smoked. She has never used smokeless tobacco. She reports that she does not drink alcohol  and does not use drugs.  Allergies  Allergen Reactions   Morphine Shortness Of Breath   Onglyza [Saxagliptin ] Other (See Comments)    Headache within 45 minutes of taking this.   Baclofen     GI upset   Lisinopril Cough   Metoclopramide Hcl     Reglan, irritable   Penicillins      MAJOR CASE OF HIVES    Family History  Problem Relation Age of Onset   Heart disease Mother 58       COPD   Hypertension Mother    Hyperlipidemia Mother    Liver disease Mother    Alcoholism Mother    Heart disease Father        COPD    Emphysema Father    Colon cancer Father 23       Died at 93   Bladder Cancer Father    Diabetes Father    Hyperlipidemia Father    Cancer Father    Liver disease Father    Sleep apnea Father    Alcoholism Father    Colon polyps Sister    Heart disease Maternal Grandmother    Heart attack Maternal Grandfather    Esophageal cancer Neg Hx    Kidney disease Neg Hx     Prior to Admission medications   Medication Sig Start Date End Date Taking? Authorizing Provider  acetaminophen  (TYLENOL ) 500 MG tablet Take 500 mg by mouth every 6 (six) hours as needed for mild pain. Pain    Yes [provider]  Dulaglutide  (TRULICITY ) 0.75 MG/0.5ML SOAJ Inject 0.75 mg into the skin once a week.   Yes [provider]  furosemide  (LASIX ) 40 MG tablet Take 80 mg by mouth daily.   Yes  [provider]  gabapentin  (NEURONTIN ) 100 MG capsule TAKE 1 TO 2 CAPSULES BY MOUTH AT NIGHT AS NEEDED FOR NERVE PAIN Patient taking differently: Take 100-200 mg by mouth at bedtime as needed (nerve pain). TAKE 1 TO 2 CAPSULES BY MOUTH AT NIGHT AS NEEDED FOR NERVE PAIN 09/09/23  Yes Burchette, Wolm ORN, MD  losartan  (COZAAR ) 100 MG tablet TAKE 1 TABLET BY MOUTH DAILY 12/09/23  Yes Burchette, Wolm ORN, MD  Multiple Vitamin (MULTIVITAMIN) capsule Take 1 capsule by  mouth daily.   Yes [provider]  simvastatin  (ZOCOR ) 40 MG tablet TAKE 1 TABLET BY MOUTH EVERY EVENING 12/09/23  Yes Burchette, Wolm ORN, MD  Accu-Chek FastClix Lancets MISC Use to check blood sugar once daily. Dx Code E11.65 06/01/19   Micheal Wolm ORN, MD  Alcohol  Swabs  PADS Use as directed 05/31/20   Micheal Wolm ORN, MD  Blood Glucose Calibration (TRUE METRIX LEVEL 1) Low SOLN Use as directed 05/31/20   Burchette, Wolm ORN, MD  Blood Glucose Monitoring Suppl (ACCU-CHEK NANO SMARTVIEW) w/Device KIT Use to check blood glucose 2 times daily. 06/21/19   Burchette, Wolm ORN, MD  Blood Glucose Monitoring Suppl (TRUE METRIX METER) DEVI Use as directed 05/31/20   Micheal Wolm ORN, MD  glucose blood (ACCU-CHEK SMARTVIEW) test strip USE TO CHECK BLOOD GLUCOSE TWICE DAILY AS DIRECTED 05/31/18   Burchette, Wolm ORN, MD  TRUEplus Lancets 28G MISC Use as directed 05/31/20   Micheal Wolm ORN, MD    Physical Exam: Vitals:   01/06/24 2341 01/07/24 0400 01/07/24 0645  BP: (!) 127/55 (!) 137/51 (!) 141/55  Pulse: 75 67 86  Resp: 16 17 17   Temp: 98.8 F (37.1 C) 98.7 F (37.1 C) 98.7 F (37.1 C)  SpO2: 100% 100% 100%   Physical Exam Vitals and nursing note reviewed.  Constitutional:      General: She is awake. She is not in acute distress.    Appearance: She is obese. She is ill-appearing.  HENT:     Head: Normocephalic.     Nose: No rhinorrhea.     Mouth/Throat:     Mouth: Mucous membranes are moist.  Eyes:      General: No scleral icterus.    Pupils: Pupils are equal, round, and reactive to light.  Neck:     Vascular: No JVD.  Cardiovascular:     Rate and Rhythm: Normal rate and regular rhythm.     Heart sounds: S1 normal and S2 normal.  Pulmonary:     Effort: Pulmonary effort is normal.     Breath sounds: No wheezing, rhonchi or rales.  Abdominal:     General: The ostomy site is clean. Bowel sounds are normal. There is no distension.     Palpations: Abdomen is soft.     Tenderness: There is no abdominal tenderness. There is no right CVA tenderness or left CVA tenderness.  Musculoskeletal:     Cervical back: Neck supple.     Right lower leg: No edema.     Left lower leg: No edema.  Skin:    Findings: Erythema and lesion present.     Comments: Left lower abdominal and left inguinal area INTRETIGO. Stage II sacral pressure ulcer. Right ankle ulcer extending almost the entire ankle perimeter. Please see pictures below.  Neurological:     General: No focal deficit present.     Mental Status: She is alert.  Psychiatric:        Mood and Affect: Mood normal.        Behavior: Behavior normal. Behavior is cooperative.                    Data Reviewed:  Results are pending, will review when available. 02/12/2016  Indications:      (R07.9).   -------------------------------------------------------------------  History:  PMH:  Acquired from the patient and from the patient&'s  chart. Chest pain.  Risk factors:  Hypertension. Diabetes  mellitus. Dyslipidemia.   -------------------------------------------------------------------  Study Conclusions   -  Left ventricle: The cavity size was normal. There was mild focal    basal hypertrophy of the septum. Systolic function was normal.    The estimated ejection fraction was in the range of 55% to 60%.    Wall motion was normal; there were no regional wall motion    abnormalities. Doppler parameters are consistent with abnormal     left ventricular relaxation (grade 1 diastolic dysfunction).  - Mitral valve: Calcified annulus.   Impressions:   - Normal LV systolic function; grade 1 diastolic dysfunction; trace    MR and TR.   Assessment and Plan: Principal Problem:   Diabetic ulcer of ankle (HCC) Admit to MedSurg/inpatient. Continue ceftriaxone  2 g every 24 hours.   Continue metronidazole  500 mg IVPB q 12 hr. Continue vancomycin  per pharmacy. Analgesics as needed. Continue local care. Follow-up blood culture and sensitivity. Check CT ankle to rule out osteomyelitis. Follow CBC and CMP in a.m.  Active Problems:   Erythema intertrigo Local care. Nystatin  powder 3 times a day.    Stage 2 skin ulcer of sacral region [facility]) Continue local care. Preventive measures.    Hyperlipidemia Continue simvastatin  40 mg p.o. daily.    Essential hypertension Continue losartan  100 mg p.o. daily. Continue furosemide  80 mg p.o. daily.    Type 2 diabetes mellitus (HCC) Carbohydrate modified diet. CBG monitoring with RI SS. Check hemoglobin A1c.    Obesity (BMI 30.0-34.9) Current BMI 32.03 kg/m. Would benefit from lifestyle modifications. Follow-up closely with PCP.    CKD (chronic kidney disease) stage 4, GFR 15-29 ml/min (HCC) Monitor renal function and electrolytes. Follow-up with nephrology as an outpatient.    Normocytic anemia In the setting of CKD and chronic illness. Monitor creatinine hemoglobin. Transfuse as needed.    Mild protein malnutrition (HCC) In the setting of anemia and chronic illness. May benefit from protein supplementation. Consider nutritional services evaluation. Follow-up albumin level.    Advance Care Planning:   Code Status: Full Code   Consults:   Family Communication: Her husband was at bedside.  Severity of Illness: The appropriate patient status for this patient is INPATIENT. Inpatient status is judged to be reasonable and necessary in order to provide the  required intensity of service to ensure the patient's safety. The patient's presenting symptoms, physical exam findings, and initial radiographic and laboratory data in the context of their chronic comorbidities is felt to place them at high risk for further clinical deterioration. Furthermore, it is not anticipated that the patient will be medically stable for discharge from the hospital within 2 midnights of admission.   * I certify that at the point of admission it is my clinical judgment that the patient will require inpatient hospital care spanning beyond 2 midnights from the point of admission due to high intensity of service, high risk for further deterioration and high frequency of surveillance required.*  Author: Alm Dorn Castor, MD 01/07/2024 7:47 AM  For on call review [email or web address].   This document was prepared using Dragon voice recognition software and may contain some unintended transcription errors.

## 2024-01-07 NOTE — ED Provider Notes (Signed)
 [place] EMERGENCY DEPARTMENT AT [facility] Provider Note   CSN: [id number] Arrival date & time: 01/06/24  2318     Patient presents with: No chief complaint on file.   Jordan [name] is a 78 y.o. female.   Patient to ED for evaluation of acute worsening of chronic sore to right ankle. Per husband, maggots present in the wound today. She reports worsening swelling of the foot and ankle, no fever, moderate pain. No new injury.  The history is provided by the patient and the spouse. No language interpreter was used.       Prior to Admission medications   Medication Sig Start Date End Date Taking? Authorizing Provider  Accu-Chek FastClix Lancets MISC Use to check blood sugar once daily. Dx Code E11.65 06/01/19   Burchette, Wolm ORN, MD  acetaminophen  (TYLENOL ) 500 MG tablet Take 500 mg by mouth every 6 (six) hours as needed for mild pain. Pain     [provider]  Alcohol  Swabs  PADS Use as directed 05/31/20   Burchette, Wolm ORN, MD  Blood Glucose Calibration (TRUE METRIX LEVEL 1) Low SOLN Use as directed 05/31/20   Burchette, Wolm ORN, MD  Blood Glucose Monitoring Suppl (ACCU-CHEK NANO SMARTVIEW) w/Device KIT Use to check blood glucose 2 times daily. 06/21/19   Burchette, Wolm ORN, MD  Blood Glucose Monitoring Suppl (TRUE METRIX METER) DEVI Use as directed 05/31/20   Burchette, Wolm ORN, MD  Dulaglutide  (TRULICITY ) 1.5 MG/0.5ML SOAJ Inject 1.5 mg into the skin once a week. 12/22/23   Burchette, Wolm ORN, MD  gabapentin  (NEURONTIN ) 100 MG capsule TAKE 1 TO 2 CAPSULES BY MOUTH AT NIGHT AS NEEDED FOR NERVE PAIN Patient taking differently: Take 100-200 mg by mouth at bedtime as needed (nerve pain). TAKE 1 TO 2 CAPSULES BY MOUTH AT NIGHT AS NEEDED FOR NERVE PAIN 09/09/23   Burchette, Wolm ORN, MD  glucose blood (ACCU-CHEK SMARTVIEW) test strip USE TO CHECK BLOOD GLUCOSE TWICE DAILY AS DIRECTED 05/31/18   Burchette, Wolm ORN, MD  losartan  (COZAAR ) 100 MG tablet TAKE 1 TABLET BY  MOUTH DAILY 12/09/23   Burchette, Wolm ORN, MD  Multiple Vitamin (MULTIVITAMIN) capsule Take 1 capsule by mouth daily.    [provider]  simvastatin  (ZOCOR ) 40 MG tablet TAKE 1 TABLET BY MOUTH EVERY EVENING 12/09/23   Burchette, Wolm ORN, MD  TRUEplus Lancets 28G MISC Use as directed 05/31/20   Micheal Wolm ORN, MD    Allergies: Morphine, Onglyza [saxagliptin ], Baclofen, Lisinopril, Metoclopramide hcl, and Penicillins    Review of Systems  Updated Vital Signs BP (!) 137/51   Pulse 67   Temp 98.7 F (37.1 C)   Resp 17   SpO2 100%   Physical Exam Constitutional:      General: She is not in acute distress.    Appearance: She is well-developed. She is not ill-appearing.  Cardiovascular:     Rate and Rhythm: Normal rate.  Pulmonary:     Effort: Pulmonary effort is normal.  Abdominal:     General: There is no distension.  Musculoskeletal:        General: Normal range of motion.     Cervical back: Normal range of motion.     Comments: Marked swelling to right lower leg and foot with erythema. There are 3 ulcerations anterior and lateral ankle. No bleeding. No infestation visualized. Chronic swelling of the foot and toes without redness.   Skin:    General: Skin is warm and dry.  Neurological:  Mental Status: She is alert and oriented to person, place, and time.     (all labs ordered are listed, but only abnormal results are displayed) Labs Reviewed  CBC WITH DIFFERENTIAL/PLATELET - Abnormal; Notable for the following components:      Result Value   RBC 3.58 (*)    Hemoglobin 9.5 (*)    HCT 32.7 (*)    MCHC 29.1 (*)    All other components within normal limits  COMPREHENSIVE METABOLIC PANEL WITH GFR - Abnormal; Notable for the following components:   CO2 17 (*)    Glucose, Bld 165 (*)    BUN 55 (*)    Creatinine, Ser 2.20 (*)    Calcium 8.8 (*)    Albumin 3.4 (*)    AST 10 (*)    GFR, Estimated 22 (*)    All other components within normal limits   BETA-HYDROXYBUTYRIC ACID  I-STAT CG4 LACTIC ACID, ED   Results for orders placed or performed during the hospital encounter of 01/06/24  CBC with Differential   Collection Time: 01/07/24  3:29 AM  Result Value Ref Range   WBC 8.8 4.0 - 10.5 K/uL   RBC 3.58 (L) 3.87 - 5.11 MIL/uL   Hemoglobin 9.5 (L) 12.0 - 15.0 g/dL   HCT 67.2 (L) 63.9 - 53.9 %   MCV 91.3 80.0 - 100.0 fL   MCH 26.5 26.0 - 34.0 pg   MCHC 29.1 (L) 30.0 - 36.0 g/dL   RDW 85.7 88.4 - 84.4 %   Platelets 394 150 - 400 K/uL   nRBC 0.0 0.0 - 0.2 %   Neutrophils Relative % 69 %   Neutro Abs 6.0 1.7 - 7.7 K/uL   Lymphocytes Relative 21 %   Lymphs Abs 1.8 0.7 - 4.0 K/uL   Monocytes Relative 6 %   Monocytes Absolute 0.5 0.1 - 1.0 K/uL   Eosinophils Relative 3 %   Eosinophils Absolute 0.3 0.0 - 0.5 K/uL   Basophils Relative 1 %   Basophils Absolute 0.0 0.0 - 0.1 K/uL   Immature Granulocytes 0 %   Abs Immature Granulocytes 0.03 0.00 - 0.07 K/uL  Comprehensive metabolic panel   Collection Time: 01/07/24  3:29 AM  Result Value Ref Range   Sodium 135 135 - 145 mmol/L   Potassium 4.2 3.5 - 5.1 mmol/L   Chloride 110 98 - 111 mmol/L   CO2 17 (L) 22 - 32 mmol/L   Glucose, Bld 165 (H) 70 - 99 mg/dL   BUN 55 (H) 8 - 23 mg/dL   Creatinine, Ser 7.79 (H) 0.44 - 1.00 mg/dL   Calcium 8.8 (L) 8.9 - 10.3 mg/dL   Total Protein 7.1 6.5 - 8.1 g/dL   Albumin 3.4 (L) 3.5 - 5.0 g/dL   AST 10 (L) 15 - 41 U/L   ALT 12 0 - 44 U/L   Alkaline Phosphatase 109 38 - 126 U/L   Total Bilirubin 0.7 0.0 - 1.2 mg/dL   GFR, Estimated 22 (L) >60 mL/min   Anion gap 8 5 - 15  I-Stat Lactic Acid   Collection Time: 01/07/24  3:38 AM  Result Value Ref Range   Lactic Acid, Venous 1.2 0.5 - 1.9 mmol/L    EKG: None  Radiology: DG Ankle Complete Right Result Date: 01/07/2024 EXAM: 3 or more VIEW(S) XRAY OF THE RIGHT ANKLE 01/07/2024 03:44:20 AM CLINICAL HISTORY: Osteo. Edema, oozing sores to ankle. COMPARISON: None available. FINDINGS: BONES AND  JOINTS: Sclerosis and deformity involving the  distal tibia with tibiotalar fusion, posttraumatic, with indwelling screw. Superimposed infection and chronic osteomyelitis are difficult to exclude. SOFT TISSUES: The soft tissues are unremarkable. IMPRESSION: 1. Sclerosis/deformity involving the distal tibia with tibiotalar fusion, posttraumatic, with indwelling screw. 2. Superimposed infection/chronic osteomyelitis is difficult to exclude. Electronically signed by: Pinkie Pebbles MD 01/07/2024 03:53 AM EDT RP Workstation: [id number]     Procedures   Medications Ordered in the ED  vancomycin  (VANCOCIN ) IVPB 1000 mg/200 mL premix (1,000 mg Intravenous New Bag/Given 01/07/24 0427)  sodium chloride  0.9 % bolus 1,000 mL (has no administration in time range)  ceFEPIme  (MAXIPIME ) 2 g in sodium chloride  0.9 % 100 mL IVPB (0 g Intravenous Stopped 01/07/24 0427)                                    Medical Decision Making This patient presents to the ED for concern of LE wound, this involves an extensive number of treatment options, and is a complaint that carries with it a high risk of complications and morbidity.  The differential diagnosis includes cellulitis, osteomyelitis, infested wound, abscess   Co morbidities that complicate the patient evaluation  Obesity, T2DM, HLD, HTN, cerebral palsy, GERD, arthritis, Stroke,    Additional history obtained:  Additional history and/or information obtained from chart review, notable for n/a   Lab Tests:  I Ordered, and personally interpreted labs.  The pertinent results include:   CBC: WBC - no leukocytosis, hgb 9.5, normal plts Cmet: Cr 2.20 (baseline 1.75), BUN 55, CO2 17 Lactic 1.2    Imaging Studies ordered:  I ordered imaging studies including ankle/foot Per radiologist interpretation:  IMPRESSION: 1. Sclerosis/deformity involving the distal tibia with tibiotalar fusion, posttraumatic, with indwelling screw. 2. Superimposed  infection/chronic osteomyelitis is difficult to exclude.      Cardiac Monitoring:  The patient was maintained on a cardiac monitor.  I personally viewed and interpreted the cardiac monitored which showed an underlying rhythm of: n/a   Medicines ordered and prescription drug management:  I ordered medication including abx  for infection   Test Considered:  N/a   Critical Interventions:  N/a   Consultations Obtained:  I requested consultation with the n/a,  and discussed lab and imaging findings as well as pertinent plan - they recommend: n/a   Problem List / ED Course:  Here with chronic right lower extremity weeping sores that have worsened in the last 2 days to include redness, and presence of maggots in the wounds She is well appearing.  Xray with ?osteomyelitis, chronicity uncertain Labs reassuring Abx started, plan for admission   Reevaluation:  After the interventions noted above, I reevaluated the patient and found that they have :improved   Social Determinants of Health:  Never a smoker, here with husband   Disposition:  After consideration of the diagnostic results and the patients response to treatment, I feel that the patient would benefit from admission.   Amount and/or Complexity of Data Reviewed Labs: ordered. Radiology: ordered.  Risk Prescription drug management. Decision regarding hospitalization.        Final diagnoses:  Cellulitis of right lower extremity    ED Discharge Orders     None          Odell Balls, DEVONNA 01/07/24 0429    Palumbo, April, MD 01/07/24 [phone]

## 2024-01-07 NOTE — Consult Note (Addendum)
 WOC consulted for LE wound. WOC Nurse has reviewed record and this patient has a positive xray or MRI for osteomyelitis, this is considered outside of the scope of practice for the WOC nurse, for that reason WOC Nurse will not consult.  Pending podiatry consult  Incidentially noted this patient has an ileal conduit, she has been followed in the past in the outpatient ostomy clinic.   Orders entered for ostomy supplies to support her needs while inpatient.   Order Supplies: 1pc convex Soila # [phone]), 2 barrier ring Soila # [phone]); drainage adapter to connect ostomy pouch to foley bag Soila [phone])  Re-consult if only topical wound care needed after orthopedic or surgical evaluation Celenia Hruska [facility] MSN, RN, [place], CNS, CWON-AP [phone]

## 2024-01-07 NOTE — ED Notes (Signed)
 ED TO INPATIENT HANDOFF REPORT  Name/Age/Gender Jordan [name] 78 y.o. female  Code Status    Code Status Orders  (From admission, onward)           Start     Ordered   01/07/24 0759  Full code  Continuous       Question:  By:  Answer:  Consent: discussion documented in EHR   01/07/24 0758           Code Status History     Date Active Date Inactive Code Status Order ID Comments User Context   07/08/2014 2152 07/12/2014 2055 Full Code [id number]  Silvester Ales, MD Inpatient   11/30/2011 0338 12/03/2011 1753 DNR [id number]  Mathew Therisa NOVAK, RN ED   10/01/2011 1604 10/04/2011 1125 Full Code [id number]  Holderness, Kreg Sor, RN Inpatient       Home/SNF/Other Home  Chief Complaint Diabetic ulcer of ankle (HCC) [Z88.377, L97.309]  Level of Care/Admitting Diagnosis ED Disposition     ED Disposition  Admit   Condition  --   Comment  Hospital Area: [facility] [[id number]]  Level of Care: Med-Surg [16]  May admit patient to [facility] or [facility] if equivalent level of care is available:: No  Covid Evaluation: Asymptomatic - no recent exposure (last 10 days) testing not required  Diagnosis: Diabetic ulcer of ankle [facility]) [652721]  Admitting Physician: CELINDA ALM LOT [[id number]]  Attending Physician: CELINDA ALM LOT [[id number]]  Certification:: I certify this patient will need inpatient services for at least 2 midnights  Expected Medical Readiness: 01/11/2024          Medical History 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   PT CAN TRANSFER BED TO CHAIR--CAN STAND BRIEFLY--BUT NOT ABLE TO WALK; RT LEG HYPEREXTENDS BACKWARD AND IS WEAK-HAS A POWER CHAIR   COLONIC POLYPS 07/11/2010   Constipation    Diabetes mellitus    ORAL MED FOR DIABETES   Gallbladder problem    GERD  (gastroesophageal reflux disease)    PAST HISTORY GERD--WATCHES DIET - NOT ON ANY MEDS FOR GERD   HYPERLIPIDEMIA 11/29/2009   HYPERTENSION 11/29/2009   Incisional hernia    NO PAIN-NOT CAUSING ANY DISCOMFORT   Kidney infection    Osteoarthritis    Osteomyelitis (HCC)    PONV (postoperative nausea and vomiting)    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HCC)    11-28-14 remains   Shortness of breath    Stroke (cerebrum) (HCC)     Allergies Allergies  Allergen Reactions   Morphine Shortness Of Breath   Onglyza [Saxagliptin ] Other (See Comments)    Headache within 45 minutes of taking this.   Baclofen     GI upset   Lisinopril Cough   Metoclopramide Hcl     Reglan, irritable   Penicillins      MAJOR CASE OF HIVES    IV Location/Drains/Wounds Patient Lines/Drains/Airways Status     Active Line/Drains/Airways     Name Placement date Placement time Site Days   Peripheral IV 01/07/24 22 G Anterior;Right Forearm 01/07/24  0331  Forearm  less than 1   Urostomy Ileal conduit LUQ --  --  LUQ  --   Urostomy LUQ --  --  LUQ  --   Ureteral  Drain/Stent Right ureter 8 Fr. 10/03/11  1441  Right ureter  4479            Labs/Imaging Results for orders placed or performed during the hospital encounter of 01/06/24 (from the past 48 hours)  CBC with Differential     Status: Abnormal   Collection Time: 01/07/24  3:29 AM  Result Value Ref Range   WBC 8.8 4.0 - 10.5 K/uL   RBC 3.58 (L) 3.87 - 5.11 MIL/uL   Hemoglobin 9.5 (L) 12.0 - 15.0 g/dL   HCT 67.2 (L) 63.9 - 53.9 %   MCV 91.3 80.0 - 100.0 fL   MCH 26.5 26.0 - 34.0 pg   MCHC 29.1 (L) 30.0 - 36.0 g/dL   RDW 85.7 88.4 - 84.4 %   Platelets 394 150 - 400 K/uL   nRBC 0.0 0.0 - 0.2 %   Neutrophils Relative % 69 %   Neutro Abs 6.0 1.7 - 7.7 K/uL   Lymphocytes Relative 21 %   Lymphs Abs 1.8 0.7 - 4.0 K/uL   Monocytes Relative 6 %   Monocytes Absolute 0.5 0.1 - 1.0 K/uL   Eosinophils Relative 3 %   Eosinophils Absolute 0.3 0.0 - 0.5 K/uL    Basophils Relative 1 %   Basophils Absolute 0.0 0.0 - 0.1 K/uL   Immature Granulocytes 0 %   Abs Immature Granulocytes 0.03 0.00 - 0.07 K/uL    Comment: Performed at [facility], [address], [place], [place] [address]  Comprehensive metabolic panel     Status: Abnormal   Collection Time: 01/07/24  3:29 AM  Result Value Ref Range   Sodium 135 135 - 145 mmol/L   Potassium 4.2 3.5 - 5.1 mmol/L   Chloride 110 98 - 111 mmol/L   CO2 17 (L) 22 - 32 mmol/L   Glucose, Bld 165 (H) 70 - 99 mg/dL    Comment: Glucose reference range applies only to samples taken after fasting for at least 8 hours.   BUN 55 (H) 8 - 23 mg/dL   Creatinine, Ser 7.79 (H) 0.44 - 1.00 mg/dL   Calcium 8.8 (L) 8.9 - 10.3 mg/dL   Total Protein 7.1 6.5 - 8.1 g/dL   Albumin 3.4 (L) 3.5 - 5.0 g/dL   AST 10 (L) 15 - 41 U/L   ALT 12 0 - 44 U/L   Alkaline Phosphatase 109 38 - 126 U/L   Total Bilirubin 0.7 0.0 - 1.2 mg/dL   GFR, Estimated 22 (L) >60 mL/min    Comment: (NOTE) Calculated using the CKD-EPI Creatinine Equation (2021)    Anion gap 8 5 - 15    Comment: Performed at [facility], [address], [place], [place] [address]  Beta-hydroxybutyric acid     Status: None   Collection Time: 01/07/24  3:29 AM  Result Value Ref Range   Beta-Hydroxybutyric Acid 0.07 0.05 - 0.27 mmol/L    Comment: Performed at [facility], [address], [place], [place] [address]  I-Stat Lactic Acid     Status: None   Collection Time: 01/07/24  3:38 AM  Result Value Ref Range   Lactic Acid, Venous 1.2 0.5 - 1.9 mmol/L   DG Ankle Complete Right Result Date: 01/07/2024 EXAM: 3 or more VIEW(S) XRAY OF THE RIGHT ANKLE 01/07/2024 03:44:20 AM CLINICAL HISTORY: Osteo. Edema, oozing sores to ankle. COMPARISON: None available. FINDINGS: BONES AND JOINTS: Sclerosis and deformity involving the distal tibia with tibiotalar fusion, posttraumatic,  with indwelling screw. Superimposed  infection and chronic osteomyelitis are difficult to exclude. SOFT TISSUES: The soft tissues are unremarkable. IMPRESSION: 1. Sclerosis/deformity involving the distal tibia with tibiotalar fusion, posttraumatic, with indwelling screw. 2. Superimposed infection/chronic osteomyelitis is difficult to exclude. Electronically signed by: Pinkie Pebbles MD 01/07/2024 03:53 AM EDT RP Workstation: [id number]    Pending Labs Unresulted Labs (From admission, onward)     Start     Ordered   01/08/24 0500  CBC  Tomorrow morning,   R        01/07/24 0758   01/08/24 0500  Comprehensive metabolic panel  Tomorrow morning,   R        01/07/24 0758   01/07/24 0750  Sedimentation rate  (COPD / Pneumonia / Cellulitis / Lower Extremity Wound (Diabetic Foot Infection))  Add-on,   AD        01/07/24 0758   01/07/24 0750  Prealbumin  (COPD / Pneumonia / Cellulitis / Lower Extremity Wound (Diabetic Foot Infection))  Add-on,   AD        01/07/24 0758   01/07/24 0437  C-reactive protein  Once,   URGENT        01/07/24 0436            Vitals/Pain Today's Vitals   01/06/24 2341 01/07/24 0400 01/07/24 0645  BP: (!) 127/55 (!) 137/51 (!) 141/55  Pulse: 75 67 86  Resp: 16 17 17   Temp: 98.8 F (37.1 C) 98.7 F (37.1 C) 98.7 F (37.1 C)  SpO2: 100% 100% 100%    Isolation Precautions No active isolations  Medications Medications  insulin  aspart (novoLOG ) injection 0-9 Units (has no administration in time range)  acetaminophen  (TYLENOL ) tablet 650 mg (has no administration in time range)    Or  acetaminophen  (TYLENOL ) suppository 650 mg (has no administration in time range)  ondansetron  (ZOFRAN ) tablet 4 mg (has no administration in time range)    Or  ondansetron  (ZOFRAN ) injection 4 mg (has no administration in time range)  vancomycin  (VANCOCIN ) IVPB 1000 mg/200 mL premix (0 mg Intravenous Stopped 01/07/24 0548)  ceFEPIme  (MAXIPIME ) 2 g in sodium chloride  0.9 % 100 mL IVPB (0 g Intravenous Stopped  01/07/24 0427)  sodium chloride  0.9 % bolus 1,000 mL (1,000 mLs Intravenous New Bag/Given 01/07/24 0437)    Mobility walks with person assist

## 2024-01-07 NOTE — Progress Notes (Signed)
 ABI has been completed.   Results can be found under chart review under CV PROC. 01/07/2024 3:42 PM Walter Min RVT, RDMS

## 2024-01-07 NOTE — Care Plan (Addendum)
 78 year old being referred for admission on account of diabetic right ankle ulcer.  Ulcer has been superinfected by maggots.  Imaging studies significant for indwelling screws.  X-ray findings could not rule out osteomyelitis.  Labs significant for Non-anion gap metabolic acidosis.  Blood glucose however not significantly deranged.  Patient is on Trulicity  at baseline.  BHA has been ordered and pending.  ESR and CRP has been ordered.  Podiatry will need to be consulted to help with debridement of wound.

## 2024-01-07 NOTE — Progress Notes (Signed)
 Pharmacy Antibiotic Note  Jordan [name] is a 78 y.o. female admitted on 01/06/2024 with diabetic R ankle ulcer, Xray cannot rule out osteomyelitis.  Pharmacy has been consulted for vancomycin  dosing. Pt with hives to PCN.   Plan: Ceftriaxone  2 gm IV q24 Flagyl  500 mg IV q12hr Vancomycin  1 gram given in ED. Will give 2nd gram to make total loading dose = 2 grams then vancomycin  1250 mg IV q48 hours for eAUC = 551.8 using SCr 2.2 & Vd 0.5 F/u renal function, WBC, temp, podiatry plans   Weight: 99.8 kg (220 lb 0.3 oz) (on 10/26/2023)  Temp (24hrs), Avg:98.7 F (37.1 C), Min:98.7 F (37.1 C), Max:98.8 F (37.1 C)  Recent Labs  Lab 01/07/24 0329 01/07/24 0338  WBC 8.8  --   CREATININE 2.20*  --   LATICACIDVEN  --  1.2    Estimated Creatinine Clearance: 26.3 mL/min (A) (by C-G formula based on SCr of 2.2 mg/dL (H)).    Allergies  Allergen Reactions   Morphine Shortness Of Breath   Onglyza [Saxagliptin ] Other (See Comments)    Headache within 45 minutes of taking this.   Baclofen     GI upset   Lisinopril Cough   Metoclopramide Hcl     Reglan, irritable   Penicillins      MAJOR CASE OF HIVES    Antimicrobials this admission: 7/24 cefepime  x 1 7/24 vancomycin >> 7/24 ceftriaxone >> 7/24 flagyl  >> Dose adjustments this admission:  Microbiology results:   Thank you for allowing pharmacy to be a part of this patient's care.   Jordan [name], Pharm.D Use secure chat for questions 01/07/2024 8:42 AM

## 2024-01-08 ENCOUNTER — Inpatient Hospital Stay (HOSPITAL_COMMUNITY)

## 2024-01-08 DIAGNOSIS — E11622 Type 2 diabetes mellitus with other skin ulcer: Secondary | ICD-10-CM | POA: Diagnosis not present

## 2024-01-08 DIAGNOSIS — L97309 Non-pressure chronic ulcer of unspecified ankle with unspecified severity: Secondary | ICD-10-CM | POA: Diagnosis not present

## 2024-01-08 LAB — COMPREHENSIVE METABOLIC PANEL WITH GFR
ALT: 11 U/L (ref 0–44)
AST: 9 U/L — ABNORMAL LOW (ref 15–41)
Albumin: 2.5 g/dL — ABNORMAL LOW (ref 3.5–5.0)
Alkaline Phosphatase: 84 U/L (ref 38–126)
Anion gap: 9 (ref 5–15)
BUN: 49 mg/dL — ABNORMAL HIGH (ref 8–23)
CO2: 16 mmol/L — ABNORMAL LOW (ref 22–32)
Calcium: 8.2 mg/dL — ABNORMAL LOW (ref 8.9–10.3)
Chloride: 112 mmol/L — ABNORMAL HIGH (ref 98–111)
Creatinine, Ser: 1.93 mg/dL — ABNORMAL HIGH (ref 0.44–1.00)
GFR, Estimated: 26 mL/min — ABNORMAL LOW (ref >60)
Glucose, Bld: 157 mg/dL — ABNORMAL HIGH (ref 70–99)
Potassium: 3.5 mmol/L (ref 3.5–5.1)
Sodium: 137 mmol/L (ref 135–145)
Total Bilirubin: 0.5 mg/dL (ref 0.0–1.2)
Total Protein: 6 g/dL — ABNORMAL LOW (ref 6.5–8.1)

## 2024-01-08 LAB — CBC
HCT: 28.2 % — ABNORMAL LOW (ref 36.0–46.0)
Hemoglobin: 8.1 g/dL — ABNORMAL LOW (ref 12.0–15.0)
MCH: 26.6 pg (ref 26.0–34.0)
MCHC: 28.7 g/dL — ABNORMAL LOW (ref 30.0–36.0)
MCV: 92.5 fL (ref 80.0–100.0)
Platelets: 351 K/uL (ref 150–400)
RBC: 3.05 MIL/uL — ABNORMAL LOW (ref 3.87–5.11)
RDW: 14.4 % (ref 11.5–15.5)
WBC: 7.7 K/uL (ref 4.0–10.5)
nRBC: 0 % (ref 0.0–0.2)

## 2024-01-08 LAB — GLUCOSE, CAPILLARY
Glucose-Capillary: 155 mg/dL — ABNORMAL HIGH (ref 70–99)
Glucose-Capillary: 184 mg/dL — ABNORMAL HIGH (ref 70–99)
Glucose-Capillary: 110 mg/dL — ABNORMAL HIGH (ref 70–99)
Glucose-Capillary: 161 mg/dL — ABNORMAL HIGH (ref 70–99)

## 2024-01-08 NOTE — Plan of Care (Signed)

## 2024-01-08 NOTE — Plan of Care (Signed)
  Problem: Coping: Goal: Ability to adjust to condition or change in health will improve Outcome: Progressing   Problem: Nutritional: Goal: Maintenance of adequate nutrition will improve Outcome: Progressing   Problem: Tissue Perfusion: Goal: Adequacy of tissue perfusion will improve Outcome: Progressing   Problem: Pain Managment: Goal: General experience of comfort will improve and/or be controlled Outcome: Progressing   Problem: Skin Integrity: Goal: Risk for impaired skin integrity will decrease Outcome: Progressing   Problem: Skin Integrity: Goal: Risk for impaired skin integrity will decrease Outcome: Progressing   Problem: Tissue Perfusion: Goal: Adequacy of tissue perfusion will improve Outcome: Progressing

## 2024-01-08 NOTE — Progress Notes (Signed)
 PROGRESS NOTE    Jordan [name]  FMW:[id number] DOB: [birth year] DOA: 01/06/2024 PCP: Micheal Wolm ORN, MD   Brief Narrative:  Jordan [name] is a 78 y.o. female with medical history significant of osteoarthritis, back pain, bilateral lower extremity edema, cataracts, cerebral palsy, colon polyps, constipation, type 2 diabetes, gallbladder disease, GERD, hyperlipidemia, hypertension, incisional hernia, pyelonephritis, osteoarthritis, history of osteomyelitis, left great toe amputation, history of CVA, bladder cancer, cystectomy, ileal conduit who presented to the emergency department with complaints of right ankle pain in the setting of multiple ulcerations associated with edema, erythema and tenderness.    Assessment & Plan:   Principal Problem:   Diabetic ulcer of ankle (HCC) Active Problems:   Hyperlipidemia   Essential hypertension   Type 2 diabetes mellitus (HCC)   Obesity (BMI 30.0-34.9)   CKD (chronic kidney disease) stage 4, GFR 15-29 ml/min (HCC)   Normocytic anemia   Mild protein malnutrition (HCC)   Erythema intertrigo   Stage 2 skin ulcer of sacral region [facility])  Diabetic ulcer of ankle (HCC) Does not meet sepsis criteria Continue ceftriaxone /metronidazole /vancomycin  Continue local care. CT without clear osteomyelitis   Erythema intertrigo Stage 2 skin ulcer of sacral region [facility]) Local care- appreciate wound care contributions Nystatin  powder 3 times a day as indicated   Hyperlipidemia Continue simvastatin  40 mg p.o. daily.  Essential hypertension Continue losartan  100 mg p.o. daily. Continue furosemide  80 mg p.o. daily.  Type 2 diabetes mellitus (HCC) Carbohydrate modified diet. CBG monitoring with RI SS. Lab Results  Component Value Date   HGBA1C 8.2 (H) 12/21/2023   Obesity (BMI 30.0-34.9) Body mass index is 32.03 kg/m. Would benefit from lifestyle modifications. Follow-up closely with PCP.   CKD (chronic kidney disease) stage 4, GFR 15-29 ml/min  (HCC) Monitor renal function and electrolytes. Follow-up with nephrology as an outpatient.   Normocytic anemia, likely anemia of chronic disease/CKD4 Monitor creatinine hemoglobin. Transfuse as needed.   Mild/moderate protein malnutrition (HCC) In the setting of anemia and chronic illness. May benefit from protein supplementation. Consider nutritional services evaluation. Follow-up albumin level.  Multiple areas of skin breakdown/pressure injuries   Intertriginous dermatitis underneath pannus and in groin  Erythema intertrigo.  Moisture Associated Skin Damage to sacrum/coccyx/buttocks  Stage 2 Pressure Injury buttocks   DVT prophylaxis: SCDs Start: 01/07/24 0758 Code Status:   Code Status: Full Code Family Communication: Husband at bedside  Status is: Inpatient  Dispo: The patient is from: Home              Anticipated d/c is to: Home              Anticipated d/c date is: 24 to 48 hours              Patient currently not medically stable for discharge  Consultants:  None  Procedures:  None  Antimicrobials:  Ceftriaxone , metronidazole , vancomycin  Nystatin  topical powder  Subjective: No acute issues or events overnight denies nausea vomit diarrhea constipation headache fever chills or chest pain  Objective: Vitals:   01/07/24 1825 01/07/24 2126 01/08/24 0131 01/08/24 0540  BP: 105/83 (!) 133/50 (!) 134/45 (!) 116/49  Pulse: 70 64 62 61  Resp: 16 16 16 16   Temp: 98.1 F (36.7 C) 98.5 F (36.9 C) 98.2 F (36.8 C) 98.9 F (37.2 C)  TempSrc:      SpO2: 100% 100% 98% 99%  Weight:        Intake/Output Summary (Last 24 hours) at 01/08/2024 1223 Last data filed at 01/08/2024 1030  Gross per 24 hour  Intake 1330.67 ml  Output 3650 ml  Net -2319.33 ml   Filed Weights   01/06/24 2341  Weight: 99.8 kg    Examination:  General:  Pleasantly resting in bed, No acute distress. HEENT:  Normocephalic atraumatic.  Sclerae nonicteric, noninjected.  Extraocular  movements intact bilaterally. Neck:  Without mass or deformity.  Trachea is midline. Lungs:  Clear to auscultate bilaterally without rhonchi, wheeze, or rales. Heart:  Regular rate and rhythm.  Without murmurs, rubs, or gallops. Abdomen:  Soft, nontender, nondistended.  Without guarding or rebound. Extremities: Without cyanosis, clubbing, edema, or obvious deformity. Skin: Multiple wounds noted as above  Data Reviewed: I have personally reviewed following labs and imaging studies  CBC: Recent Labs  Lab 01/07/24 0329 01/08/24 0325  WBC 8.8 7.7  NEUTROABS 6.0  --   HGB 9.5* 8.1*  HCT 32.7* 28.2*  MCV 91.3 92.5  PLT 394 351   Basic Metabolic Panel: Recent Labs  Lab 01/07/24 0329 01/08/24 0325  NA 135 137  K 4.2 3.5  CL 110 112*  CO2 17* 16*  GLUCOSE 165* 157*  BUN 55* 49*  CREATININE 2.20* 1.93*  CALCIUM 8.8* 8.2*   GFR: Estimated Creatinine Clearance: 30 mL/min (A) (by C-G formula based on SCr of 1.93 mg/dL (H)). Liver Function Tests: Recent Labs  Lab 01/07/24 0329 01/08/24 0325  AST 10* 9*  ALT 12 11  ALKPHOS 109 84  BILITOT 0.7 0.5  PROT 7.1 6.0*  ALBUMIN 3.4* 2.5*   No results for input(s): LIPASE, AMYLASE in the last 168 hours. No results for input(s): AMMONIA in the last 168 hours. Coagulation Profile: No results for input(s): INR, PROTIME in the last 168 hours. Cardiac Enzymes: No results for input(s): CKTOTAL, CKMB, CKMBINDEX, TROPONINI in the last 168 hours. BNP (last 3 results) No results for input(s): PROBNP in the last 8760 hours. HbA1C: No results for input(s): HGBA1C in the last 72 hours. CBG: Recent Labs  Lab 01/07/24 1209 01/07/24 1716 01/07/24 2118 01/08/24 0740 01/08/24 1145  GLUCAP 170* 158* 174* 155* 184*   Lipid Profile: No results for input(s): CHOL, HDL, LDLCALC, TRIG, CHOLHDL, LDLDIRECT in the last 72 hours. Thyroid  Function Tests: No results for input(s): TSH, T4TOTAL, FREET4,  T3FREE, THYROIDAB in the last 72 hours. Anemia Panel: No results for input(s): VITAMINB12, FOLATE, FERRITIN, TIBC, IRON, RETICCTPCT in the last 72 hours. Sepsis Labs: Recent Labs  Lab 01/07/24 0338  LATICACIDVEN 1.2    No results found for this or any previous visit (from the past 240 hours).       Radiology Studies: CT ANKLE RIGHT WO CONTRAST Result Date: 01/08/2024 CLINICAL DATA:  Ankle diabetic ulcer. Hx of stage 4 CKD. EXAM: CT OF THE RIGHT ANKLE WITHOUT CONTRAST TECHNIQUE: Multidetector CT imaging of the right ankle was performed according to the standard protocol. Multiplanar CT image reconstructions were also generated. RADIATION DOSE REDUCTION: This exam was performed according to the departmental dose-optimization program which includes automated exposure control, adjustment of the mA and/or kV according to patient size and/or use of iterative reconstruction technique. COMPARISON:  Right ankle radiographs dated 01/07/2024. FINDINGS: Bones/Joint/Cartilage There is chronic fusion of the tibiotalar, talocalcaneal, calcaneocuboid, and talonavicular joints with evidence of remote arthrodesis. A single intact posteriorly oriented screw traverses the distal tibial metaphysis. No evidence of acute osteolysis or erosive changes. Hypertrophic osseous changes along the lateral base of the cuboid with adjacent 9 mm well corticated ossicle which could reflect an os perineum or  relate to prior trauma. TMT joints appear aligned with mild-to-moderate joint space narrowing. Degenerative spurring at the level of the navicular-cuneiform articulation. Ligaments Ligaments are suboptimally evaluated by CT. Muscles and Tendons Diffuse atrophy of the musculature, which could reflect chronic denervation changes. Soft tissue Cutaneous thickening and irregularity with soft tissue swelling of the ankle extending through the dorsal foot. More focal region of cutaneous thickening with possible  ulceration along the posterior ankle. No loculated fluid collection. No soft tissue gas. IMPRESSION: 1. Cutaneous thickening and irregularity with soft tissue swelling of the ankle extending through the dorsal foot. More focal region of cutaneous thickening with possible ulceration along the posterior ankle. No abscess. No soft tissue gas. 2. No acute osseous abnormality. 3. Chronic fusion of the hindfoot with evidence of remote arthrodesis. 4. Mild-to-moderate osteoarthritis of the midfoot. 5. Diffuse atrophy of the musculature, which could reflect chronic denervation changes. Electronically Signed   By: Harrietta Sherry M.D.   On: 01/08/2024 09:17   VAS US  ABI WITH/WO TBI Result Date: 01/07/2024  LOWER EXTREMITY DOPPLER STUDY Patient Name:  Viviana Trimble  Date of Exam:   01/07/2024 Medical Rec #: [id number]     Accession #:    [id number] Date of Birth: 1946-02-15     Patient Gender: F Patient Age:   50 years Exam Location:  [facility] Procedure:      VAS US  ABI WITH/WO TBI Referring Phys: --------------------------------------------------------------------------------  Indications: Ulceration. High Risk Factors: Hypertension, hyperlipidemia, Diabetes, no history of                    smoking, prior CVA. Other Factors: CKD, chronic venous stasis.  Limitations: Today's exam was limited due to an open wound and tissue/skin              properties. Comparison Study: No previous exams on file Performing Technologist: Jody Hill RVT, RDMS  Examination Guidelines: A complete evaluation includes at minimum, Doppler waveform signals and systolic blood pressure reading at the level of bilateral brachial, anterior tibial, and posterior tibial arteries, when vessel segments are accessible. Bilateral testing is considered an integral part of a complete examination. Photoelectric Plethysmograph (PPG) waveforms and toe systolic pressure readings are included as required and additional duplex testing as needed. Limited  examinations for reoccurring indications may be performed as noted.  ABI Findings: +---------+------------------+-----+----------+--------+ Right    Rt Pressure (mmHg)IndexWaveform  Comment  +---------+------------------+-----+----------+--------+ Brachial 161                    triphasic          +---------+------------------+-----+----------+--------+ PTA                             monophasic         +---------+------------------+-----+----------+--------+ DP                              biphasic           +---------+------------------+-----+----------+--------+ Burnetta Pillow               0.76 Normal             +---------+------------------+-----+----------+--------+ +---------+------------------+-----+---------+-------+ Left     Lt Pressure (mmHg)IndexWaveform Comment +---------+------------------+-----+---------+-------+ Brachial 155                    biphasic         +---------+------------------+-----+---------+-------+  PTA      143               0.89 biphasic         +---------+------------------+-----+---------+-------+ DP       147               0.91 triphasic        +---------+------------------+-----+---------+-------+ Great Toe108               0.67 Normal           +---------+------------------+-----+---------+-------+  Summary: Right: The right toe-brachial index is normal. Unable to obtain ABO due to open wounds. Left: Resting left ankle-brachial index indicates mild left lower extremity arterial disease. The left toe-brachial index is mildly abnormal. *See table(s) above for measurements and observations.     Preliminary    DG Ankle Complete Right Result Date: 01/07/2024 EXAM: 3 or more VIEW(S) XRAY OF THE RIGHT ANKLE 01/07/2024 03:44:20 AM CLINICAL HISTORY: Osteo. Edema, oozing sores to ankle. COMPARISON: None available. FINDINGS: BONES AND JOINTS: Sclerosis and deformity involving the distal tibia with tibiotalar fusion,  posttraumatic, with indwelling screw. Superimposed infection and chronic osteomyelitis are difficult to exclude. SOFT TISSUES: The soft tissues are unremarkable. IMPRESSION: 1. Sclerosis/deformity involving the distal tibia with tibiotalar fusion, posttraumatic, with indwelling screw. 2. Superimposed infection/chronic osteomyelitis is difficult to exclude. Electronically signed by: Pinkie Pebbles MD 01/07/2024 03:53 AM EDT RP Workstation: [id number]        Scheduled Meds:  furosemide   80 mg Oral Daily   Gerhardt's butt cream   Topical BID   insulin  aspart  0-9 Units Subcutaneous TID WC   losartan   100 mg Oral Daily   multivitamin with minerals  1 tablet Oral Daily   nutrition supplement (JUVEN)  1 packet Oral BID BM   nystatin    Topical TID   simvastatin   40 mg Oral QPM   zinc  sulfate (50mg  elemental zinc )  220 mg Oral Daily   Continuous Infusions:  cefTRIAXone  (ROCEPHIN )  IV 2 g (01/07/24 2158)   metronidazole  500 mg (01/08/24 1006)   [START ON 01/09/2024] vancomycin        LOS: 1 day   Time spent:  Elsie JAYSON Montclair, DO [facility]  If 7PM-7AM, please contact night-coverage [email or web address]  01/08/2024, 12:23 PM

## 2024-01-09 ENCOUNTER — Other Ambulatory Visit (HOSPITAL_COMMUNITY): Payer: Self-pay

## 2024-01-09 DIAGNOSIS — E11622 Type 2 diabetes mellitus with other skin ulcer: Secondary | ICD-10-CM | POA: Diagnosis not present

## 2024-01-09 DIAGNOSIS — L97309 Non-pressure chronic ulcer of unspecified ankle with unspecified severity: Secondary | ICD-10-CM | POA: Diagnosis not present

## 2024-01-09 LAB — CBC
HCT: 29.2 % — ABNORMAL LOW (ref 36.0–46.0)
Hemoglobin: 8.6 g/dL — ABNORMAL LOW (ref 12.0–15.0)
MCH: 26.4 pg (ref 26.0–34.0)
MCHC: 29.5 g/dL — ABNORMAL LOW (ref 30.0–36.0)
MCV: 89.6 fL (ref 80.0–100.0)
Platelets: 351 K/uL (ref 150–400)
RBC: 3.26 MIL/uL — ABNORMAL LOW (ref 3.87–5.11)
RDW: 14.2 % (ref 11.5–15.5)
WBC: 7.9 K/uL (ref 4.0–10.5)
nRBC: 0 % (ref 0.0–0.2)

## 2024-01-09 LAB — BASIC METABOLIC PANEL WITH GFR
Anion gap: 6 (ref 5–15)
BUN: 46 mg/dL — ABNORMAL HIGH (ref 8–23)
CO2: 19 mmol/L — ABNORMAL LOW (ref 22–32)
Calcium: 8.3 mg/dL — ABNORMAL LOW (ref 8.9–10.3)
Chloride: 113 mmol/L — ABNORMAL HIGH (ref 98–111)
Creatinine, Ser: 1.97 mg/dL — ABNORMAL HIGH (ref 0.44–1.00)
GFR, Estimated: 25 mL/min — ABNORMAL LOW (ref >60)
Glucose, Bld: 147 mg/dL — ABNORMAL HIGH (ref 70–99)
Potassium: 3.4 mmol/L — ABNORMAL LOW (ref 3.5–5.1)
Sodium: 138 mmol/L (ref 135–145)

## 2024-01-09 LAB — GLUCOSE, CAPILLARY
Glucose-Capillary: 136 mg/dL — ABNORMAL HIGH (ref 70–99)
Glucose-Capillary: 140 mg/dL — ABNORMAL HIGH (ref 70–99)

## 2024-01-09 MED ORDER — GERHARDT'S BUTT CREAM
1.0000 | TOPICAL_CREAM | Freq: Two times a day (BID) | CUTANEOUS | 0 refills | Status: DC
  Filled 2024-01-09: qty 180, 90d supply, fill #0

## 2024-01-09 MED ORDER — ZINC SULFATE 220 (50 ZN) MG PO TABS
220.0000 mg | ORAL_TABLET | Freq: Every day | ORAL | 0 refills | Status: AC
  Filled 2024-01-09: qty 14, 14d supply, fill #0

## 2024-01-09 MED ORDER — GERHARDT'S BUTT CREAM: 1.0000 | TOPICAL_CREAM | Freq: Two times a day (BID) | CUTANEOUS | 0 refills | Status: DC

## 2024-01-09 MED ORDER — NYSTATIN 100000 UNIT/GM EX POWD
Freq: Three times a day (TID) | CUTANEOUS | 2 refills | Status: DC
  Filled 2024-01-09: qty 60, 30d supply, fill #0

## 2024-01-09 MED ORDER — DOXYCYCLINE HYCLATE 100 MG PO TABS
100.0000 mg | ORAL_TABLET | Freq: Two times a day (BID) | ORAL | 0 refills | Status: DC
  Filled 2024-01-09: qty 18, 9d supply, fill #0

## 2024-01-09 NOTE — Progress Notes (Signed)
 Patient very angry and tearful this AM during bedside shift report.  States, I just want to go home and no one is listening to me.  This RN and the night shift RN attempted to educate the patient and her spouse at bedside about her illness and the need for IV antibiotics and wound care.  The patients husband seems very confused and we were unable to educate him.  The wife is very upset and unwilling to listen.  The night shift RN expressed concern over the situation because the husband gets very confused and wanders the hallway asking inappropriate questions and does not seem aware of the situation.  Wife states that the husband is unable to leave the hospital because he does not drive.  She has to drive for him.  MD, charge RN and SW/CM have been notified of the situation.

## 2024-01-09 NOTE — Plan of Care (Signed)
  Problem: Education: Goal: Knowledge of General Education information will improve Description: Including pain rating scale, medication(s)/side effects and non-pharmacologic comfort measures Outcome: Not Progressing   Problem: Coping: Goal: Level of anxiety will decrease Outcome: Not Progressing   Problem: Safety: Goal: Ability to remain free from injury will improve Outcome: Progressing

## 2024-01-09 NOTE — Progress Notes (Signed)
 Patient's husband upset that she has not been discharged yet, explained that she was here to receive IV antibiotics to help her wounds, husband kept asking what if I discharge her myself?, I explained that would mean leaving against medical advice and that she would not be able to get her prescription for oral antibiotics after she is finished with the IV antibiotics, husband very irritated asking the same questions repeatedly, being fully transparent I explained that I could not give him the exact time the doctor would round tomorrow, nor could I say that she would for sure be discharged, husband stated repeatedly that they had somewhere to be at 11am tomorrow but I explained that she may not be ready/able to discharge at that time, husband continued to pace around the room, was somewhat unsteady himself with some BUE tremors and swelling to his BLE, also walking barefoot and refused socks, I explained that if he had to go home, she would be well taken care of but he does not want to leave her, prior nurse reported that the husband had some issues last night with sundowning, wandering the halls multiple times and reporting to the staff that someone was trying to break in the room, I alerted CN Beryl regarding my concerns and we will continue to round frequently on patients room for safety.

## 2024-01-09 NOTE — Plan of Care (Signed)
 Problem: Education: Goal: Ability to describe self-care measures that may prevent or decrease complications (Diabetes Survival Skills Education) will improve Outcome: Adequate for Discharge Goal: Individualized Educational Video(s) Outcome: Adequate for Discharge   Problem: Coping: Goal: Ability to adjust to condition or change in health will improve Outcome: Adequate for Discharge   Problem: Health Behavior/Discharge Planning: Goal: Ability to identify and utilize available resources and services will improve Outcome: Adequate for Discharge Goal: Ability to manage health-related needs will improve Outcome: Adequate for Discharge   Problem: Metabolic: Goal: Ability to maintain appropriate glucose levels will improve Outcome: Adequate for Discharge   Problem: Nutritional: Goal: Maintenance of adequate nutrition will improve Outcome: Adequate for Discharge Goal: Progress toward achieving an optimal weight will improve Outcome: Adequate for Discharge   Problem: Skin Integrity: Goal: Risk for impaired skin integrity will decrease Outcome: Adequate for Discharge   Problem: Tissue Perfusion: Goal: Adequacy of tissue perfusion will improve Outcome: Adequate for Discharge   Problem: Education: Goal: Ability to describe self-care measures that may prevent or decrease complications (Diabetes Survival Skills Education) will improve Outcome: Adequate for Discharge Goal: Individualized Educational Video(s) Outcome: Adequate for Discharge   Problem: Coping: Goal: Ability to adjust to condition or change in health will improve Outcome: Adequate for Discharge   Problem: Fluid Volume: Goal: Ability to maintain a balanced intake and output will improve Outcome: Adequate for Discharge   Problem: Health Behavior/Discharge Planning: Goal: Ability to identify and utilize available resources and services will improve Outcome: Adequate for Discharge Goal: Ability to manage health-related  needs will improve Outcome: Adequate for Discharge   Problem: Metabolic: Goal: Ability to maintain appropriate glucose levels will improve Outcome: Adequate for Discharge   Problem: Nutritional: Goal: Maintenance of adequate nutrition will improve Outcome: Adequate for Discharge Goal: Progress toward achieving an optimal weight will improve Outcome: Adequate for Discharge   Problem: Skin Integrity: Goal: Risk for impaired skin integrity will decrease Outcome: Adequate for Discharge   Problem: Tissue Perfusion: Goal: Adequacy of tissue perfusion will improve Outcome: Adequate for Discharge   Problem: Education: Goal: Knowledge of General Education information will improve Description: Including pain rating scale, medication(s)/side effects and non-pharmacologic comfort measures 01/09/2024 1354 by Berkeley Verdie BRAVO, RN Outcome: Adequate for Discharge 01/09/2024 1050 by Berkeley Verdie BRAVO, RN Outcome: Not Progressing   Problem: Health Behavior/Discharge Planning: Goal: Ability to manage health-related needs will improve Outcome: Adequate for Discharge   Problem: Clinical Measurements: Goal: Ability to maintain clinical measurements within normal limits will improve Outcome: Adequate for Discharge Goal: Will remain free from infection Outcome: Adequate for Discharge Goal: Diagnostic test results will improve Outcome: Adequate for Discharge Goal: Respiratory complications will improve Outcome: Adequate for Discharge Goal: Cardiovascular complication will be avoided Outcome: Adequate for Discharge   Problem: Nutrition: Goal: Adequate nutrition will be maintained Outcome: Adequate for Discharge   Problem: Activity: Goal: Risk for activity intolerance will decrease Outcome: Adequate for Discharge   Problem: Coping: Goal: Level of anxiety will decrease 01/09/2024 1354 by Berkeley Verdie BRAVO, RN Outcome: Adequate for Discharge 01/09/2024 1050 by Berkeley Verdie BRAVO, RN Outcome: Not  Progressing   Problem: Elimination: Goal: Will not experience complications related to bowel motility Outcome: Adequate for Discharge Goal: Will not experience complications related to urinary retention Outcome: Adequate for Discharge   Problem: Pain Managment: Goal: General experience of comfort will improve and/or be controlled Outcome: Adequate for Discharge   Problem: Safety: Goal: Ability to remain free from injury will improve 01/09/2024 1354 by Berkeley Verdie  E, RN Outcome: Adequate for Discharge 01/09/2024 1050 by Berkeley Verdie BRAVO, RN Outcome: Progressing   Problem: Skin Integrity: Goal: Risk for impaired skin integrity will decrease Outcome: Adequate for Discharge   Problem: Education: Goal: Knowledge of General Education information will improve Description: Including pain rating scale, medication(s)/side effects and non-pharmacologic comfort measures Outcome: Adequate for Discharge   Problem: Health Behavior/Discharge Planning: Goal: Ability to manage health-related needs will improve Outcome: Adequate for Discharge   Problem: Clinical Measurements: Goal: Ability to maintain clinical measurements within normal limits will improve Outcome: Adequate for Discharge Goal: Will remain free from infection Outcome: Adequate for Discharge Goal: Diagnostic test results will improve Outcome: Adequate for Discharge Goal: Respiratory complications will improve Outcome: Adequate for Discharge Goal: Cardiovascular complication will be avoided Outcome: Adequate for Discharge   Problem: Activity: Goal: Risk for activity intolerance will decrease Outcome: Adequate for Discharge

## 2024-01-09 NOTE — TOC Transition Note (Signed)
 Transition of Care [facility]) - Discharge Note   Patient Details  Name: Jordan [name] MRN: [id number] Date of Birth: Feb 28, 1946  [facility]) CM/SW Contact:  Sheri ONEIDA Sharps, LCSW Phone Number: 01/09/2024, 2:02 PM   Clinical Narrative:    Pt medically ready to dc home. Pt plans to drive self home. CSW discussed with pt to address safety concerns. Pt drives using hand devices and states that foot being wrapped will not be an issue. Pt husband states that he was an Freight forwarder in [employer] and says he's able to assist with wound care. Husband stated that they have several members from church that are lined up ready to help with care when pt dc home. HHRN setup w/ [facility] for wound care. No further TOC needs.   Final next level of care: Home w Home Health Services Barriers to Discharge: Barriers Resolved   Patient Goals and CMS Choice Patient states their goals for this hospitalization and ongoing recovery are:: return home   Choice offered to / list presented to : NA      Discharge Placement                    Patient and family notified of of transfer: 01/09/24  Discharge Plan and Services Additional resources added to the After Visit Summary for   In-house Referral: Clinical Social Work              DME Arranged: N/A DME Agency: NA       HH Arranged: RN HH Agency: [facility] Date [facility] Agency Contacted: 01/09/24 Time HH Agency Contacted: 1402    Social Drivers of Health (SDOH) Interventions SDOH Screenings   Food Insecurity: No Food Insecurity (01/07/2024)  Housing: Low Risk  (01/07/2024)  Transportation Needs: No Transportation Needs (01/07/2024)  Utilities: Not At Risk (01/07/2024)  Alcohol  Screen: Low Risk  (02/28/2020)  Depression (PHQ2-9): Low Risk  (04/15/2022)  Financial Resource Strain: Low Risk  (04/15/2022)  Physical Activity: Insufficiently Active (04/15/2022)  Social Connections: Socially Integrated (01/07/2024)  Stress: No  Stress Concern Present (04/15/2022)  Tobacco Use: Low Risk  (01/07/2024)     Readmission Risk Interventions    01/07/2024    2:02 PM  Readmission Risk Prevention Plan  Transportation Screening Complete  PCP or Specialist Appt within 5-7 Days Complete  Home Care Screening Complete  Medication Review (RN CM) Complete

## 2024-01-09 NOTE — Consult Note (Signed)
 WOC Nurse Consult Note: patient s CT scan was negative for osteomyelitis, WOC reconsulted for recommendations for wound care to R lower leg  Reason for Consult: R lower leg wounds  Wound type: full thickness R lower leg ? Venous insufficiency  Pressure Injury POA: NA  Measurement: R medial leg 2 wounds separated by a small amount of intact skin, superior wound 4 cm x 2 cm x 0.2 cm; distal 2 cm x 2 cm x 0.1 cm largely clean after cleansing with Vashe 80% red moist 20% yellow; R lateral leg 3 cm x 3 cm x 0.1 cm 100% clean  Wound bed: as above  Drainage (amount, consistency, odor) tan exudate  Periwound: skin around wounds moist with slight maceration; patient does have dry scaly hypekeratotic skin to legs and feet, in between toes  Dressing procedure/placement/frequency: Cleanse R lower leg wounds with Vashe wound cleanser Soila [phone]) do not rinse and allow to air dry.  Apply silver hydrofiber Lawson #[id number] cut to fit wound beds daily, cover with dry gauze or ABD pad and secure with Kerlix roll gauze.    Discussed above wound care with husband. Also discussed cleaning in between toes with soap and water then applying a moisturizing cream such as Eucerin to dry skin of feet and lower legs (do not place in between toes).  Patients husband says he feels comfortable doing wound care at home. Supplies provided and instructed husband how to do wound care every other day.    Patient states she has been to the wound center in the past and will never go again.  She states it was very painful and she will not endure that torture again. Secure chat sent to primary MD and bedside nurse regarding above.   WOC team will not follow. RE-consult if further needs arise.   Thank you,    Powell Bar MSN, RN-BC, [employer]

## 2024-01-09 NOTE — Plan of Care (Signed)
  Problem: Coping: Goal: Ability to adjust to condition or change in health will improve Outcome: Progressing   Problem: Elimination: Goal: Will not experience complications related to bowel motility Outcome: Progressing Goal: Will not experience complications related to urinary retention Outcome: Progressing   Problem: Pain Managment: Goal: General experience of comfort will improve and/or be controlled Outcome: Progressing   Problem: Safety: Goal: Ability to remain free from injury will improve Outcome: Progressing   Problem: Skin Integrity: Goal: Risk for impaired skin integrity will decrease Outcome: Progressing

## 2024-01-09 NOTE — Discharge Summary (Signed)
 Physician Discharge Summary  Jordan [name] FMW:[id number] DOB: 1945/09/19 DOA: 01/06/2024  PCP: Micheal Wolm ORN, MD  Admit date: 01/06/2024 Discharge date: 01/09/2024  Admitted From: Home Disposition: Home  Recommendations for Outpatient Follow-up:  Follow up with PCP in 1-2 weeks Follow-up with wound care as discussed  Home Health: Nursing/wound care Equipment/Devices: No new equipment  Discharge Condition: Stable CODE STATUS: Full Diet recommendation: Low-salt low-fat low-carb diet  Brief/Interim Summary: Jordan [name] is a 78 y.o. female with medical history significant of osteoarthritis, back pain, bilateral lower extremity edema, cataracts, cerebral palsy, colon polyps, constipation, type 2 diabetes, gallbladder disease, GERD, hyperlipidemia, hypertension, incisional hernia, pyelonephritis, osteoarthritis, history of osteomyelitis, left great toe amputation, history of CVA, bladder cancer, cystectomy, ileal conduit who presented to the emergency department with complaints of right ankle pain in the setting of multiple ulcerations associated with edema, erythema and tenderness.  Patient admitted as above with concern over osteomyelitis, fortunately imaging here has revealed osteomyelitis en route confirms superficial cellulitis, given imaging and clinical improvement we will continue patient on outpatient antibiotics as below.  We recommended close follow-up with wound care center but patient reports  I will never return to that place - we discussed home health with nursing and bandage exchanges with family and friends which she was agreeable to.  At this time she is otherwise stable and agreeable for discharge home.  Discharge Diagnoses:  Principal Problem:   Diabetic ulcer of ankle (HCC) Active Problems:   Hyperlipidemia   Essential hypertension   Type 2 diabetes mellitus (HCC)   Obesity (BMI 30.0-34.9)   CKD (chronic kidney disease) stage 4, GFR 15-29 ml/min (HCC)    Normocytic anemia   Mild protein malnutrition (HCC)   Erythema intertrigo   Stage 2 skin ulcer of sacral region [facility])  Diabetic ulcer of ankle (HCC) Does not meet sepsis criteria Transition to doxycycline  for coverage, outpatient follow-up as discussed  Wound care as below  CT without clear osteomyelitis   Erythema intertrigo Stage 2 skin ulcer of sacral region (HCC) Local care- appreciate wound care contributions Nystatin  powder 3 times a day as indicated   Hyperlipidemia Continue simvastatin  40 mg p.o. daily.   Essential hypertension Continue losartan  100 mg p.o. daily. Continue furosemide  80 mg p.o. daily.   Type 2 diabetes mellitus uncontrolled with hyperglycemia Carbohydrate modified diet. CBG monitoring with RI SS. A1C 8.2   Obesity Body mass index is 32.03 kg/m. Would benefit from lifestyle modifications. Follow-up closely with PCP.   CKD (chronic kidney disease) stage 4, GFR 15-29 ml/min (HCC) Follow-up with nephrology as an outpatient.   Normocytic anemia, likely anemia of chronic disease/CKD4 Stable   Mild/moderate protein malnutrition (HCC) In the setting of anemia and chronic illness. May benefit from protein supplementation. Consider nutritional services evaluation at discharge   Multiple areas of skin breakdown/pressure injuries Intertriginous dermatitis underneath pannus and in groin  Erythema intertrigo.  Moisture Associated Skin Damage to sacrum/coccyx/buttocks  Stage 2 Pressure Injury buttocks   Discharge Instructions  Discharge Instructions     Call MD for:  difficulty breathing, headache or visual disturbances   Complete by: As directed    Call MD for:  extreme fatigue   Complete by: As directed    Call MD for:  hives   Complete by: As directed    Call MD for:  persistant dizziness or light-headedness   Complete by: As directed    Call MD for:  persistant nausea and vomiting   Complete by: As directed  Call MD for:  redness,  tenderness, or signs of infection (pain, swelling, redness, odor or green/yellow discharge around incision site)   Complete by: As directed    Call MD for:  severe uncontrolled pain   Complete by: As directed    Call MD for:  temperature >100.4   Complete by: As directed    Diet - low sodium heart healthy   Complete by: As directed    Discharge wound care:   Complete by: As directed    Daily: Cleanse coccyx/buttocks with soap and water, dry and apply silver hydrofiber ([place] [phone]) to open areas daily.  Apply Gerhardt's to surrounding moisture damaged skin 2 times daily and prn soiling.  Cover entire area with ABD pad and tape.  Twice daily: Cleanse underneath pannus/abdominal folds with soap and water, do not rinse and allow to air dry. May sprinkle area with floor stock antifungal powder (white and green label Microguard) then apply Interdry [employer] # [phone] Measure and cut length of InterDry to fit in skin folds that have skin breakdown Tuck InterDry fabric into skin folds in a single layer, allow for 2 inches of overhang from skin edges to allow for wicking to occur May remove to bathe; dry area thoroughly and then tuck into affected areas again Do not apply any creams or ointments when using InterDry DO NOT THROW AWAY FOR 5 DAYS unless soiled with stool DO NOT [facility] product, this will inactivate the silver in the material  New sheet of Interdry should be applied after 5 days of use if patient continues to have skin breakdown   Increase activity slowly   Complete by: As directed       Allergies as of 01/09/2024       Reactions   Morphine Shortness Of Breath   Onglyza [saxagliptin ] Other (See Comments)   Headache within 45 minutes of taking this.   Baclofen    GI upset   Lisinopril Cough   Metoclopramide Hcl    Reglan, irritable   Penicillins     MAJOR CASE OF HIVES        Medication List     TAKE these medications    Accu-Chek FastClix Lancets Misc Use  to check blood sugar once daily. Dx Code E11.65   TRUEplus Lancets 28G Misc Use as directed   Accu-Chek Nano SmartView w/Device Kit Use to check blood glucose 2 times daily.   True Metrix Meter Devi Use as directed   acetaminophen  500 MG tablet Commonly known as: TYLENOL  Take 500 mg by mouth every 6 (six) hours as needed for mild pain. Pain   Alcohol  Swabs  Pads Use as directed   doxycycline  100 MG tablet Commonly known as: VIBRA -TABS Take 1 tablet (100 mg total) by mouth 2 (two) times daily.   furosemide  40 MG tablet Commonly known as: LASIX  Take 80 mg by mouth daily.   gabapentin  100 MG capsule Commonly known as: NEURONTIN  TAKE 1 TO 2 CAPSULES BY MOUTH AT NIGHT AS NEEDED FOR NERVE PAIN What changed: See the new instructions.   Gerhardt's butt cream Crea Apply 1 Application topically 2 (two) times daily.   glucose blood test strip Commonly known as: Accu-Chek SmartView USE TO CHECK BLOOD GLUCOSE TWICE DAILY AS DIRECTED   losartan  100 MG tablet Commonly known as: COZAAR  TAKE 1 TABLET BY MOUTH DAILY   multivitamin capsule Take 1 capsule by mouth daily.   nystatin  powder Commonly known as: MYCOSTATIN /NYSTOP  Apply topically 3 (three) times daily.  simvastatin  40 MG tablet Commonly known as: ZOCOR  TAKE 1 TABLET BY MOUTH EVERY EVENING   True Metrix Level 1 Low Soln Use as directed   Trulicity  0.75 MG/0.5ML Soaj Generic drug: Dulaglutide  Inject 0.75 mg into the skin once a week.   Zinc  Sulfate 220 (50 Zn) MG Tabs Take 1 tablet (220 mg total) by mouth daily for 14 days.               Discharge Care Instructions  (From admission, onward)           Start     Ordered   01/09/24 0000  Discharge wound care:       Comments: Daily: Cleanse coccyx/buttocks with soap and water, dry and apply silver hydrofiber [employer] [phone]) to open areas daily.  Apply Gerhardt's to surrounding moisture damaged skin 2 times daily and prn soiling.  Cover entire area  with ABD pad and tape.  Twice daily: Cleanse underneath pannus/abdominal folds with soap and water, do not rinse and allow to air dry. May sprinkle area with floor stock antifungal powder (white and green label Microguard) then apply Interdry [employer] # [phone] Measure and cut length of InterDry to fit in skin folds that have skin breakdown Tuck InterDry fabric into skin folds in a single layer, allow for 2 inches of overhang from skin edges to allow for wicking to occur May remove to bathe; dry area thoroughly and then tuck into affected areas again Do not apply any creams or ointments when using InterDry DO NOT THROW AWAY FOR 5 DAYS unless soiled with stool DO NOT [facility] product, this will inactivate the silver in the material  New sheet of Interdry should be applied after 5 days of use if patient continues to have skin breakdown   01/09/24 1257            Allergies  Allergen Reactions   Morphine Shortness Of Breath   Onglyza [Saxagliptin ] Other (See Comments)    Headache within 45 minutes of taking this.   Baclofen     GI upset   Lisinopril Cough   Metoclopramide Hcl     Reglan, irritable   Penicillins      MAJOR CASE OF HIVES    Consultations: None  Procedures/Studies: VAS US  ABI WITH/WO TBI Result Date: 01/08/2024  LOWER EXTREMITY DOPPLER STUDY Patient Name:  Florabel Faulks  Date of Exam:   01/07/2024 Medical Rec #: [id number]     Accession #:    [id number] Date of Birth: 10-10-1945     Patient Gender: F Patient Age:   23 years Exam Location:  [facility] Procedure:      VAS US  ABI WITH/WO TBI Referring Phys: --------------------------------------------------------------------------------  Indications: Ulceration. High Risk Factors: Hypertension, hyperlipidemia, Diabetes, no history of                    smoking, prior CVA. Other Factors: CKD, chronic venous stasis.  Limitations: Today's exam was limited due to an open wound and tissue/skin              properties.  Comparison Study: No previous exams on file Performing Technologist: Jody Hill RVT, RDMS  Examination Guidelines: A complete evaluation includes at minimum, Doppler waveform signals and systolic blood pressure reading at the level of bilateral brachial, anterior tibial, and posterior tibial arteries, when vessel segments are accessible. Bilateral testing is considered an integral part of a complete examination. Photoelectric Plethysmograph (PPG) waveforms and toe systolic pressure  readings are included as required and additional duplex testing as needed. Limited examinations for reoccurring indications may be performed as noted.  ABI Findings: +---------+------------------+-----+----------+--------+ Right    Rt Pressure (mmHg)IndexWaveform  Comment  +---------+------------------+-----+----------+--------+ Brachial 161                    triphasic          +---------+------------------+-----+----------+--------+ PTA                             monophasic         +---------+------------------+-----+----------+--------+ DP                              biphasic           +---------+------------------+-----+----------+--------+ Burnetta Pillow               0.76 Normal             +---------+------------------+-----+----------+--------+ +---------+------------------+-----+---------+-------+ Left     Lt Pressure (mmHg)IndexWaveform Comment +---------+------------------+-----+---------+-------+ Brachial 155                    biphasic         +---------+------------------+-----+---------+-------+ PTA      143               0.89 biphasic         +---------+------------------+-----+---------+-------+ DP       147               0.91 triphasic        +---------+------------------+-----+---------+-------+ Great Toe108               0.67 Normal           +---------+------------------+-----+---------+-------+  Summary: Right: The right toe-brachial index is normal. Unable  to obtain ABO due to open wounds. Left: Resting left ankle-brachial is normal. The left toe-brachial index is mildly abnormal. *See table(s) above for measurements and observations.  Electronically signed by Norman Serve on 01/08/2024 at 3:17:19 PM.    Final    CT ANKLE RIGHT WO CONTRAST Result Date: 01/08/2024 CLINICAL DATA:  Ankle diabetic ulcer. Hx of stage 4 CKD. EXAM: CT OF THE RIGHT ANKLE WITHOUT CONTRAST TECHNIQUE: Multidetector CT imaging of the right ankle was performed according to the standard protocol. Multiplanar CT image reconstructions were also generated. RADIATION DOSE REDUCTION: This exam was performed according to the departmental dose-optimization program which includes automated exposure control, adjustment of the mA and/or kV according to patient size and/or use of iterative reconstruction technique. COMPARISON:  Right ankle radiographs dated 01/07/2024. FINDINGS: Bones/Joint/Cartilage There is chronic fusion of the tibiotalar, talocalcaneal, calcaneocuboid, and talonavicular joints with evidence of remote arthrodesis. A single intact posteriorly oriented screw traverses the distal tibial metaphysis. No evidence of acute osteolysis or erosive changes. Hypertrophic osseous changes along the lateral base of the cuboid with adjacent 9 mm well corticated ossicle which could reflect an os perineum or relate to prior trauma. TMT joints appear aligned with mild-to-moderate joint space narrowing. Degenerative spurring at the level of the navicular-cuneiform articulation. Ligaments Ligaments are suboptimally evaluated by CT. Muscles and Tendons Diffuse atrophy of the musculature, which could reflect chronic denervation changes. Soft tissue Cutaneous thickening and irregularity with soft tissue swelling of the ankle extending through the dorsal foot. More focal region of cutaneous thickening with possible ulceration along the posterior ankle. No loculated fluid  collection. No soft tissue gas.  IMPRESSION: 1. Cutaneous thickening and irregularity with soft tissue swelling of the ankle extending through the dorsal foot. More focal region of cutaneous thickening with possible ulceration along the posterior ankle. No abscess. No soft tissue gas. 2. No acute osseous abnormality. 3. Chronic fusion of the hindfoot with evidence of remote arthrodesis. 4. Mild-to-moderate osteoarthritis of the midfoot. 5. Diffuse atrophy of the musculature, which could reflect chronic denervation changes. Electronically Signed   By: Harrietta Sherry M.D.   On: 01/08/2024 09:17   DG Ankle Complete Right Result Date: 01/07/2024 EXAM: 3 or more VIEW(S) XRAY OF THE RIGHT ANKLE 01/07/2024 03:44:20 AM CLINICAL HISTORY: Osteo. Edema, oozing sores to ankle. COMPARISON: None available. FINDINGS: BONES AND JOINTS: Sclerosis and deformity involving the distal tibia with tibiotalar fusion, posttraumatic, with indwelling screw. Superimposed infection and chronic osteomyelitis are difficult to exclude. SOFT TISSUES: The soft tissues are unremarkable. IMPRESSION: 1. Sclerosis/deformity involving the distal tibia with tibiotalar fusion, posttraumatic, with indwelling screw. 2. Superimposed infection/chronic osteomyelitis is difficult to exclude. Electronically signed by: Pinkie Pebbles MD 01/07/2024 03:53 AM EDT RP Workstation: [id number]     Subjective: No acute issues or events overnight   Discharge Exam: Vitals:   01/08/24 2108 01/09/24 0640  BP: (!) 142/62 (!) 121/47  Pulse: 65 69  Resp: 16 16  Temp: 98.2 F (36.8 C) 98 F (36.7 C)  SpO2: 100% 99%   Vitals:   01/08/24 1430 01/08/24 2108 01/08/24 2300 01/09/24 0640  BP: (!) 138/50 (!) 142/62  (!) 121/47  Pulse: 67 65  69  Resp: 18 16  16   Temp: 97.9 F (36.6 C) 98.2 F (36.8 C)  98 F (36.7 C)  TempSrc:  Oral  Oral  SpO2: 99% 100%  99%  Weight:   99.8 kg   Height:   5' 9 (1.753 m)     General: Pt is alert, awake, not in acute distress Cardiovascular:  RRR, S1/S2 +, no rubs, no gallops Respiratory: CTA bilaterally, no wheezing, no rhonchi Abdominal: Soft, NT, ND, bowel sounds + Extremities: Lower extremity wounds as above, blanching erythema bilateral lower extremities right greater than left -bandage clean dry intact Skin: Multiple wounds noted as above   The results of significant diagnostics from this hospitalization (including imaging, microbiology, ancillary and laboratory) are listed below for reference.     Microbiology: No results found for this or any previous visit (from the past 240 hours).   Labs: Basic Metabolic Panel: Recent Labs  Lab 01/07/24 0329 01/08/24 0325 01/09/24 0644  NA 135 137 138  K 4.2 3.5 3.4*  CL 110 112* 113*  CO2 17* 16* 19*  GLUCOSE 165* 157* 147*  BUN 55* 49* 46*  CREATININE 2.20* 1.93* 1.97*  CALCIUM 8.8* 8.2* 8.3*   Liver Function Tests: Recent Labs  Lab 01/07/24 0329 01/08/24 0325  AST 10* 9*  ALT 12 11  ALKPHOS 109 84  BILITOT 0.7 0.5  PROT 7.1 6.0*  ALBUMIN 3.4* 2.5*   No results for input(s): LIPASE, AMYLASE in the last 168 hours. No results for input(s): AMMONIA in the last 168 hours. CBC: Recent Labs  Lab 01/07/24 0329 01/08/24 0325 01/09/24 0644  WBC 8.8 7.7 7.9  NEUTROABS 6.0  --   --   HGB 9.5* 8.1* 8.6*  HCT 32.7* 28.2* 29.2*  MCV 91.3 92.5 89.6  PLT 394 351 351   CBG: Recent Labs  Lab 01/08/24 1145 01/08/24 1702 01/08/24 2127 01/09/24 0725 01/09/24 1210  GLUCAP  184* 110* 161* 136* 140*   Urinalysis    Component Value Date/Time   COLORURINE YELLOW 05/01/2022 1603   APPEARANCEUR HAZY (A) 05/01/2022 1603   LABSPEC 1.011 05/01/2022 1603   PHURINE 6.5 05/01/2022 1603   GLUCOSEU NEGATIVE 05/01/2022 1603   HGBUR NEGATIVE 05/01/2022 1603   BILIRUBINUR NEGATIVE 05/01/2022 1603   BILIRUBINUR neg 04/02/2017 1110   KETONESUR NEGATIVE 05/01/2022 1603   PROTEINUR TRACE (A) 05/01/2022 1603   UROBILINOGEN 0.2 04/02/2017 1110   UROBILINOGEN 0.2  07/08/2014 1905   NITRITE NEGATIVE 05/01/2022 1603   LEUKOCYTESUR LARGE (A) 05/01/2022 1603   Sepsis Labs Recent Labs  Lab 01/07/24 0329 01/08/24 0325 01/09/24 0644  WBC 8.8 7.7 7.9   Microbiology No results found for this or any previous visit (from the past 240 hours).   Time coordinating discharge: Over 30 minutes  SIGNED:   Elsie JAYSON Mont[place], DO [facility] 01/09/2024, 1:21 PM Pager   If 7PM-7AM, please contact night-coverage [email or web address]

## 2024-01-11 ENCOUNTER — Other Ambulatory Visit (HOSPITAL_COMMUNITY): Payer: Self-pay

## 2024-01-11 ENCOUNTER — Telehealth: Payer: Self-pay

## 2024-01-11 NOTE — Transitions of Care (Post Inpatient/ED Visit) (Signed)
   01/11/2024  Name: Jordan [name] MRN: [id number] DOB: July 31, 1945  Today's TOC FU Call Status: Today's TOC FU Call Status:: Unsuccessful Call (1st Attempt) Unsuccessful Call (1st Attempt) Date: 01/11/24  Attempted to reach the patient regarding the most recent Inpatient/ED visit.  Follow Up Plan: Additional outreach attempts will be made to reach the patient to complete the Transitions of Care (Post Inpatient/ED visit) call.   Alan Ee, RN, BSN, CEN [employer]- Transition of Care Team.  [facility] [phone]

## 2024-01-12 ENCOUNTER — Telehealth: Payer: Self-pay

## 2024-01-12 NOTE — Transitions of Care (Post Inpatient/ED Visit) (Signed)
   01/12/2024  Name: Jordan [name] MRN: [id number] DOB: 02-08-1946  Today's TOC FU Call Status: Today's TOC FU Call Status:: Unsuccessful Call (3rd Attempt) Unsuccessful Call (3rd Attempt) Date: 01/12/24  Attempted to reach the patient regarding the most recent Inpatient/ED visit.  Follow Up Plan: No further outreach attempts will be made at this time. We have been unable to contact the patient.  Alan Ee, RN, BSN, CEN [employer]- Transition of Care Team.  [facility] [phone]

## 2024-01-12 NOTE — Transitions of Care (Post Inpatient/ED Visit) (Signed)
   01/12/2024  Name: Jordan [name] MRN: [id number] DOB: 30-Jan-1946  Today's TOC FU Call Status: Today's TOC FU Call Status:: Unsuccessful Call (2nd Attempt) Unsuccessful Call (2nd Attempt) Date: 01/12/24  Attempted to reach the patient regarding the most recent Inpatient/ED visit.  Follow Up Plan: Additional outreach attempts will be made to reach the patient to complete the Transitions of Care (Post Inpatient/ED visit) call.   Alan Ee, RN, BSN, CEN [employer]- Transition of Care Team.  [facility] [phone]

## 2024-01-15 ENCOUNTER — Ambulatory Visit: Payer: Self-pay | Admitting: Family Medicine

## 2024-01-15 VITALS — BP 130/66 | HR 64 | Temp 97.5°F

## 2024-01-15 DIAGNOSIS — L03115 Cellulitis of right lower limb: Secondary | ICD-10-CM

## 2024-01-15 DIAGNOSIS — R600 Localized edema: Secondary | ICD-10-CM

## 2024-01-15 DIAGNOSIS — L97919 Non-pressure chronic ulcer of unspecified part of right lower leg with unspecified severity: Secondary | ICD-10-CM

## 2024-01-15 DIAGNOSIS — E441 Mild protein-calorie malnutrition: Secondary | ICD-10-CM

## 2024-01-15 NOTE — Progress Notes (Signed)
 Established Patient Office Visit  Subjective   Patient ID: Jordan [name], female    DOB: [birth year]  Age: 78 y.o. MRN: [id number]  Chief Complaint  Patient presents with   Medical Management of Chronic Issues    HPI   Jordan [name] has multiple chronic problems including history of obesity, osteoarthritis, chronic bilateral lower extremity edema, cerebral palsy, type 2 diabetes, hyperlipidemia, hypertension, history of left great toe amputation, history of CVA, history of bladder cancer with cystectomy who presented recently to ER with right ankle pain with ulcerations of the right lower leg and ankle region and increased erythema.  Was initially thought she may have osteomyelitis but CT ankle did not confirm this.  She was treated with antibiotics and discharged on doxycycline  which she remains on at this time.  She denies any fever.  She apparently also had sacral ulcer.  Patient is unable to see or attend to this area and her husband has some cognitive impairment which makes situation challenging.  She has diabetes with last A1c 8.2%.  She has chronic kidney disease with GFR around 29.  No evidence for sepsis from recent admission.  She was given detailed instructions regarding wound care but does not seem to recall details of this.  She comes in today with bandages and states this has not been changed at home over the past few days.  Discussion was for her to follow-up with wound care center management but she declined.  She is wheel chair bound and transportation and transfers are difficult for her without assistance..    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   PT CAN TRANSFER BED TO CHAIR--CAN STAND BRIEFLY--BUT NOT ABLE TO WALK; RT LEG HYPEREXTENDS BACKWARD AND IS WEAK-HAS A POWER CHAIR   COLONIC POLYPS 07/11/2010    Constipation    Diabetes mellitus    ORAL MED FOR DIABETES   Gallbladder problem    GERD (gastroesophageal reflux disease)    PAST HISTORY GERD--WATCHES DIET - NOT ON ANY MEDS FOR GERD   HYPERLIPIDEMIA 11/29/2009   HYPERTENSION 11/29/2009   Incisional hernia    NO PAIN-NOT CAUSING ANY DISCOMFORT   Kidney infection    Osteoarthritis    Osteomyelitis (HCC)    PONV (postoperative nausea and vomiting)    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HCC)    11-28-14 remains   Shortness of breath    Stroke (cerebrum) (HCC)    Past Surgical History:  Procedure Laterality Date   1992     ABDOMINAL HYSTERECTOMY  1990   partial   ACHILLES TENDON LENGTHENING  1962   ADENOIDECTOMY  1950   APPENDECTOMY     CARPAL TUNNEL RELEASE  1983   CHOLECYSTECTOMY     COLONOSCOPY WITH PROPOFOL  N/A 12/07/2014   Procedure: COLONOSCOPY WITH PROPOFOL ;  Surgeon: Lupita FORBES Commander, MD;  Location: [facility];  Service: Endoscopy;  Laterality: N/A;   LEFT CARPAL TUNNEL RELEASE X 2     LUMBAR SYMPATHETECTOMIES X 2     MULTIPLE RIGHT ANKLE SURGERIES     NEPHROLITHOTOMY  10/01/2011   Procedure: NEPHROLITHOTOMY PERCUTANEOUS;  Surgeon: Garnette Shack, MD;  Location: [facility];  Service: Urology;  Laterality: Right;        PARTIAL AMPUTATION OF LEFT GREAT TOE  1985   radical cystectomy  1992   neurogenic bladder sec CP   REPAIR OF NASAL FRACTURE  1985   REVISION UROSTOMY CUTANEOUS  1992   RIGHT CARPAL TUNNEL RELEASE     RIGHT HIP FLEXION CONTRACTURE REPAIR  1986   RT ANKLE FUSION  1975   SEVERAL RT ANKLE SURGERIES PRIOR TO THE FUSION   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 She has never used smokeless tobacco. She reports that she does not drink alcohol  and does not use drugs. family history includes Alcoholism in her father and mother; Bladder Cancer in her father; Cancer in  her father; Colon cancer (age of onset: 62) in her father; Colon polyps in her sister; Diabetes in her father; Emphysema in her father; Heart attack in her maternal grandfather; Heart disease in her father and maternal grandmother; Heart disease (age of onset: 31) in her mother; Hyperlipidemia in her father and mother; Hypertension in her mother; Liver disease in her father and mother; Sleep apnea in her father. Allergies  Allergen Reactions   Morphine Shortness Of Breath   Onglyza [Saxagliptin ] Other (See Comments)    Headache within 45 minutes of taking this.   Baclofen     GI upset   Lisinopril Cough   Metoclopramide Hcl     Reglan, irritable   Penicillins      MAJOR CASE OF HIVES    Review of Systems  Constitutional:  Negative for chills and fever.  Cardiovascular:  Negative for chest pain.  Gastrointestinal:  Negative for abdominal pain.      Objective:     BP 130/66   Pulse 64   Temp (!) 97.5 F (36.4 C) (Oral)   SpO2 99%  BP Readings from Last 3 Encounters:  01/15/24 130/66  01/09/24 (!) 121/47  12/21/23 134/68   Wt Readings from Last 3 Encounters:  01/08/24 220 lb (99.8 kg)  10/26/23 220 lb (99.8 kg)  03/04/23 198 lb 6.6 oz (90 kg)      Physical Exam Vitals reviewed.  Constitutional:      General: She is not in acute distress. Cardiovascular:     Rate and Rhythm: Normal rate and regular rhythm.  Pulmonary:     Effort: Pulmonary effort is normal.     Breath sounds: Normal breath sounds.  Musculoskeletal:     Comments: She has compression stocking on left lower extremity.  2+ edema right lower extremity  Skin:    Comments: Right buttock reveals small eschar.  No drainage.  Right lower leg reveals couple of superficial ulcerative areas medially and 1 laterally.  These are each about 1 x 2 cm in area.  She has some superficial exudate.  No foul odor.  No purulent drainage.  Still has some mild surrounding erythema.  Neurological:     Mental Status: She  is alert.      No results found for any visits on 01/15/24.  Last CBC Lab Results  Component Value Date   WBC 7.9 01/09/2024   HGB 8.6 (L) 01/09/2024   HCT 29.2 (L) 01/09/2024   MCV 89.6 01/09/2024   MCH 26.4 01/09/2024   RDW 14.2 01/09/2024   PLT 351 01/09/2024   Last metabolic panel Lab Results  Component Value Date   GLUCOSE 147 (H)  01/09/2024   NA 138 01/09/2024   K 3.4 (L) 01/09/2024   CL 113 (H) 01/09/2024   CO2 19 (L) 01/09/2024   BUN 46 (H) 01/09/2024   CREATININE 1.97 (H) 01/09/2024   GFRNONAA 25 (L) 01/09/2024   CALCIUM 8.3 (L) 01/09/2024   PHOS 3.6 07/09/2014   PROT 6.0 (L) 01/08/2024   ALBUMIN 2.5 (L) 01/08/2024   BILITOT 0.5 01/08/2024   ALKPHOS 84 01/08/2024   AST 9 (L) 01/08/2024   ALT 11 01/08/2024   ANIONGAP 6 01/09/2024   Last hemoglobin A1c Lab Results  Component Value Date   HGBA1C 8.2 (H) 12/21/2023      The ASCVD Risk score (Arnett DK, et al., 2019) failed to calculate for the following reasons:   Risk score cannot be calculated because patient has a medical history suggesting prior/existing ASCVD    Assessment & Plan:   Problem List Items Addressed This Visit   None Visit Diagnoses       Ulcer of right lower extremity, unspecified ulcer stage (HCC)    -  Primary   Relevant Orders   Ambulatory referral to Home Health     Patient seen for hospital follow-up regarding ulcerations right ankle and lower leg with cellulitis in the setting of chronic bilateral lower extremity edema and type 2 diabetes.  CT scan showed no obvious osteomyelitis.  -Wounds were cleaned and redressed today. - Referral for home health wound care.  Patient has declined wound care center.  We hope to get someone out early next week - Discussed importance of good nutrition especially increased protein intake.  Consider supplement such as Glucerna (recent albumin 2.5) - Elevate leg as much as possible - Finish out doxycycline  - Follow-up immediately for any  fever or increased redness  Return in about 2 weeks (around 01/29/2024).    Wolm Scarlet, MD

## 2024-01-15 NOTE — Patient Instructions (Signed)
 Set up 2 week follow up.    Follow up sooner for any fever or increased redness  Change outer dressing every other day

## 2024-01-18 ENCOUNTER — Ambulatory Visit: Payer: Self-pay | Admitting: Family Medicine

## 2024-01-28 ENCOUNTER — Ambulatory Visit: Admitting: Adult Health

## 2024-01-28 ENCOUNTER — Other Ambulatory Visit: Payer: Self-pay

## 2024-01-28 ENCOUNTER — Ambulatory Visit: Payer: Self-pay | Admitting: *Deleted

## 2024-01-28 ENCOUNTER — Encounter (HOSPITAL_COMMUNITY): Payer: Self-pay | Admitting: Emergency Medicine

## 2024-01-28 ENCOUNTER — Ambulatory Visit (INDEPENDENT_AMBULATORY_CARE_PROVIDER_SITE_OTHER): Admitting: Family Medicine

## 2024-01-28 ENCOUNTER — Inpatient Hospital Stay (HOSPITAL_COMMUNITY)
Admission: EM | Admit: 2024-01-28 | Discharge: 2024-02-02 | DRG: 300 | Disposition: A | Discharge status: Home Health | Attending: Osteopathic Medicine | Admitting: Osteopathic Medicine

## 2024-01-28 ENCOUNTER — Encounter: Payer: Self-pay | Admitting: Family Medicine

## 2024-01-28 ENCOUNTER — Emergency Department (HOSPITAL_COMMUNITY)

## 2024-01-28 VITALS — BP 148/62 | HR 61 | Temp 98.0°F | Wt 185.0 lb

## 2024-01-28 DIAGNOSIS — Z833 Family history of diabetes mellitus: Secondary | ICD-10-CM

## 2024-01-28 DIAGNOSIS — Z906 Acquired absence of other parts of urinary tract: Secondary | ICD-10-CM

## 2024-01-28 DIAGNOSIS — G809 Cerebral palsy, unspecified: Secondary | ICD-10-CM | POA: Diagnosis present

## 2024-01-28 DIAGNOSIS — Z7985 Long-term (current) use of injectable non-insulin antidiabetic drugs: Secondary | ICD-10-CM | POA: Diagnosis not present

## 2024-01-28 DIAGNOSIS — L03116 Cellulitis of left lower limb: Secondary | ICD-10-CM | POA: Diagnosis present

## 2024-01-28 DIAGNOSIS — Z9049 Acquired absence of other specified parts of digestive tract: Secondary | ICD-10-CM

## 2024-01-28 DIAGNOSIS — I10 Essential (primary) hypertension: Secondary | ICD-10-CM | POA: Diagnosis present

## 2024-01-28 DIAGNOSIS — Z860100 Personal history of colon polyps, unspecified: Secondary | ICD-10-CM

## 2024-01-28 DIAGNOSIS — E1122 Type 2 diabetes mellitus with diabetic chronic kidney disease: Secondary | ICD-10-CM | POA: Diagnosis present

## 2024-01-28 DIAGNOSIS — M7989 Other specified soft tissue disorders: Secondary | ICD-10-CM | POA: Diagnosis not present

## 2024-01-28 DIAGNOSIS — Z8052 Family history of malignant neoplasm of bladder: Secondary | ICD-10-CM

## 2024-01-28 DIAGNOSIS — I878 Other specified disorders of veins: Secondary | ICD-10-CM | POA: Diagnosis not present

## 2024-01-28 DIAGNOSIS — Z751 Person awaiting admission to adequate facility elsewhere: Secondary | ICD-10-CM

## 2024-01-28 DIAGNOSIS — L97309 Non-pressure chronic ulcer of unspecified ankle with unspecified severity: Secondary | ICD-10-CM | POA: Diagnosis not present

## 2024-01-28 DIAGNOSIS — L03115 Cellulitis of right lower limb: Secondary | ICD-10-CM | POA: Diagnosis not present

## 2024-01-28 DIAGNOSIS — Z885 Allergy status to narcotic agent status: Secondary | ICD-10-CM

## 2024-01-28 DIAGNOSIS — E785 Hyperlipidemia, unspecified: Secondary | ICD-10-CM | POA: Diagnosis not present

## 2024-01-28 DIAGNOSIS — Z83719 Family history of colon polyps, unspecified: Secondary | ICD-10-CM

## 2024-01-28 DIAGNOSIS — Z981 Arthrodesis status: Secondary | ICD-10-CM | POA: Diagnosis not present

## 2024-01-28 DIAGNOSIS — Z83438 Family history of other disorder of lipoprotein metabolism and other lipidemia: Secondary | ICD-10-CM

## 2024-01-28 DIAGNOSIS — Z825 Family history of asthma and other chronic lower respiratory diseases: Secondary | ICD-10-CM

## 2024-01-28 DIAGNOSIS — I872 Venous insufficiency (chronic) (peripheral): Secondary | ICD-10-CM | POA: Diagnosis not present

## 2024-01-28 DIAGNOSIS — I129 Hypertensive chronic kidney disease with stage 1 through stage 4 chronic kidney disease, or unspecified chronic kidney disease: Secondary | ICD-10-CM | POA: Diagnosis not present

## 2024-01-28 DIAGNOSIS — R600 Localized edema: Secondary | ICD-10-CM | POA: Diagnosis not present

## 2024-01-28 DIAGNOSIS — L97819 Non-pressure chronic ulcer of other part of right lower leg with unspecified severity: Secondary | ICD-10-CM | POA: Diagnosis present

## 2024-01-28 DIAGNOSIS — M19071 Primary osteoarthritis, right ankle and foot: Secondary | ICD-10-CM | POA: Diagnosis not present

## 2024-01-28 DIAGNOSIS — B871 Wound myiasis: Secondary | ICD-10-CM | POA: Diagnosis not present

## 2024-01-28 DIAGNOSIS — Z8249 Family history of ischemic heart disease and other diseases of the circulatory system: Secondary | ICD-10-CM

## 2024-01-28 DIAGNOSIS — E876 Hypokalemia: Secondary | ICD-10-CM | POA: Diagnosis not present

## 2024-01-28 DIAGNOSIS — Z888 Allergy status to other drugs, medicaments and biological substances status: Secondary | ICD-10-CM

## 2024-01-28 DIAGNOSIS — I890 Lymphedema, not elsewhere classified: Secondary | ICD-10-CM | POA: Diagnosis not present

## 2024-01-28 DIAGNOSIS — E1159 Type 2 diabetes mellitus with other circulatory complications: Secondary | ICD-10-CM | POA: Diagnosis not present

## 2024-01-28 DIAGNOSIS — N319 Neuromuscular dysfunction of bladder, unspecified: Secondary | ICD-10-CM | POA: Diagnosis present

## 2024-01-28 DIAGNOSIS — E11622 Type 2 diabetes mellitus with other skin ulcer: Secondary | ICD-10-CM

## 2024-01-28 DIAGNOSIS — M25469 Effusion, unspecified knee: Secondary | ICD-10-CM | POA: Diagnosis not present

## 2024-01-28 DIAGNOSIS — Z79899 Other long term (current) drug therapy: Secondary | ICD-10-CM

## 2024-01-28 DIAGNOSIS — Z89412 Acquired absence of left great toe: Secondary | ICD-10-CM

## 2024-01-28 DIAGNOSIS — Z880 Allergy status to penicillin: Secondary | ICD-10-CM | POA: Diagnosis not present

## 2024-01-28 DIAGNOSIS — N184 Chronic kidney disease, stage 4 (severe): Secondary | ICD-10-CM | POA: Diagnosis present

## 2024-01-28 DIAGNOSIS — Z811 Family history of alcohol abuse and dependence: Secondary | ICD-10-CM

## 2024-01-28 DIAGNOSIS — M85871 Other specified disorders of bone density and structure, right ankle and foot: Secondary | ICD-10-CM | POA: Diagnosis not present

## 2024-01-28 DIAGNOSIS — E119 Type 2 diabetes mellitus without complications: Secondary | ICD-10-CM

## 2024-01-28 DIAGNOSIS — Z800 Family history of malignant neoplasm of digestive organs: Secondary | ICD-10-CM

## 2024-01-28 DIAGNOSIS — D631 Anemia in chronic kidney disease: Secondary | ICD-10-CM | POA: Diagnosis present

## 2024-01-28 DIAGNOSIS — S81801A Unspecified open wound, right lower leg, initial encounter: Secondary | ICD-10-CM | POA: Diagnosis not present

## 2024-01-28 DIAGNOSIS — Z8673 Personal history of transient ischemic attack (TIA), and cerebral infarction without residual deficits: Secondary | ICD-10-CM | POA: Diagnosis not present

## 2024-01-28 DIAGNOSIS — H9190 Unspecified hearing loss, unspecified ear: Secondary | ICD-10-CM | POA: Diagnosis present

## 2024-01-28 DIAGNOSIS — L97829 Non-pressure chronic ulcer of other part of left lower leg with unspecified severity: Secondary | ICD-10-CM | POA: Diagnosis present

## 2024-01-28 DIAGNOSIS — E8722 Chronic metabolic acidosis: Secondary | ICD-10-CM | POA: Diagnosis present

## 2024-01-28 DIAGNOSIS — Z90711 Acquired absence of uterus with remaining cervical stump: Secondary | ICD-10-CM

## 2024-01-28 DIAGNOSIS — N189 Chronic kidney disease, unspecified: Secondary | ICD-10-CM | POA: Diagnosis not present

## 2024-01-28 DIAGNOSIS — F40240 Claustrophobia: Secondary | ICD-10-CM | POA: Diagnosis present

## 2024-01-28 LAB — CBC WITH DIFFERENTIAL/PLATELET
Abs Immature Granulocytes: 0.03 K/uL (ref 0.00–0.07)
Basophils Absolute: 0.1 K/uL (ref 0.0–0.1)
Basophils Relative: 1 %
Eosinophils Absolute: 0.2 K/uL (ref 0.0–0.5)
Eosinophils Relative: 2 %
HCT: 33.1 % — ABNORMAL LOW (ref 36.0–46.0)
Hemoglobin: 9.6 g/dL — ABNORMAL LOW (ref 12.0–15.0)
Immature Granulocytes: 0 %
Lymphocytes Relative: 23 %
Lymphs Abs: 2 K/uL (ref 0.7–4.0)
MCH: 26.4 pg (ref 26.0–34.0)
MCHC: 29 g/dL — ABNORMAL LOW (ref 30.0–36.0)
MCV: 90.9 fL (ref 80.0–100.0)
Monocytes Absolute: 0.5 K/uL (ref 0.1–1.0)
Monocytes Relative: 5 %
Neutro Abs: 6.1 K/uL (ref 1.7–7.7)
Neutrophils Relative %: 69 %
Platelets: 335 K/uL (ref 150–400)
RBC: 3.64 MIL/uL — ABNORMAL LOW (ref 3.87–5.11)
RDW: 15 % (ref 11.5–15.5)
WBC: 8.9 K/uL (ref 4.0–10.5)
nRBC: 0 % (ref 0.0–0.2)

## 2024-01-28 LAB — COMPREHENSIVE METABOLIC PANEL WITH GFR
ALT: 16 U/L (ref 0–44)
AST: 18 U/L (ref 15–41)
Albumin: 3 g/dL — ABNORMAL LOW (ref 3.5–5.0)
Alkaline Phosphatase: 93 U/L (ref 38–126)
Anion gap: 10 (ref 5–15)
BUN: 56 mg/dL — ABNORMAL HIGH (ref 8–23)
CO2: 16 mmol/L — ABNORMAL LOW (ref 22–32)
Calcium: 8.9 mg/dL (ref 8.9–10.3)
Chloride: 111 mmol/L (ref 98–111)
Creatinine, Ser: 2.07 mg/dL — ABNORMAL HIGH (ref 0.44–1.00)
GFR, Estimated: 24 mL/min — ABNORMAL LOW (ref >60)
Glucose, Bld: 145 mg/dL — ABNORMAL HIGH (ref 70–99)
Potassium: 4.5 mmol/L (ref 3.5–5.1)
Sodium: 137 mmol/L (ref 135–145)
Total Bilirubin: 0.5 mg/dL (ref 0.0–1.2)
Total Protein: 6.9 g/dL (ref 6.5–8.1)

## 2024-01-28 LAB — CBC
HCT: 29.3 % — ABNORMAL LOW (ref 36.0–46.0)
Hemoglobin: 8.4 g/dL — ABNORMAL LOW (ref 12.0–15.0)
MCH: 26.5 pg (ref 26.0–34.0)
MCHC: 28.7 g/dL — ABNORMAL LOW (ref 30.0–36.0)
MCV: 92.4 fL (ref 80.0–100.0)
Platelets: 277 K/uL (ref 150–400)
RBC: 3.17 MIL/uL — ABNORMAL LOW (ref 3.87–5.11)
RDW: 15.1 % (ref 11.5–15.5)
WBC: 8.9 K/uL (ref 4.0–10.5)
nRBC: 0 % (ref 0.0–0.2)

## 2024-01-28 LAB — I-STAT CG4 LACTIC ACID, ED: Lactic Acid, Venous: 0.4 mmol/L — ABNORMAL LOW (ref 0.5–1.9)

## 2024-01-28 LAB — PREALBUMIN: Prealbumin: 16 mg/dL — ABNORMAL LOW (ref 18–38)

## 2024-01-28 LAB — GLUCOSE, CAPILLARY
Glucose-Capillary: 157 mg/dL — ABNORMAL HIGH (ref 70–99)
Glucose-Capillary: 144 mg/dL — ABNORMAL HIGH (ref 70–99)

## 2024-01-28 LAB — CREATININE, SERUM
Creatinine, Ser: 2.07 mg/dL — ABNORMAL HIGH (ref 0.44–1.00)
GFR, Estimated: 24 mL/min — ABNORMAL LOW (ref >60)

## 2024-01-28 LAB — C-REACTIVE PROTEIN: CRP: 0.7 mg/dL (ref <1.0)

## 2024-01-28 LAB — SEDIMENTATION RATE: Sed Rate: 53 mm/h — ABNORMAL HIGH (ref 0–22)

## 2024-01-28 MED ORDER — SIMVASTATIN 20 MG PO TABS
40.0000 mg | ORAL_TABLET | Freq: Every evening | ORAL | Status: DC
  Administered 2024-01-29 – 2024-02-01 (×4): 40 mg via ORAL
  Filled 2024-01-28 (×4): qty 2

## 2024-01-28 MED ORDER — LINEZOLID 600 MG/300ML IV SOLN
600.0000 mg | Freq: Two times a day (BID) | INTRAVENOUS | Status: DC
  Administered 2024-01-28 – 2024-01-31 (×7): 600 mg via INTRAVENOUS
  Filled 2024-01-28 (×8): qty 300

## 2024-01-28 MED ORDER — LOSARTAN POTASSIUM 50 MG PO TABS
100.0000 mg | ORAL_TABLET | Freq: Every day | ORAL | Status: DC
  Administered 2024-01-29 – 2024-02-02 (×5): 100 mg via ORAL
  Filled 2024-01-28 (×5): qty 2

## 2024-01-28 MED ORDER — ENOXAPARIN SODIUM 30 MG/0.3ML IJ SOSY
30.0000 mg | PREFILLED_SYRINGE | INTRAMUSCULAR | Status: DC
  Administered 2024-01-28 – 2024-02-01 (×5): 30 mg via SUBCUTANEOUS
  Filled 2024-01-28 (×5): qty 0.3

## 2024-01-28 MED ORDER — ACETAMINOPHEN 500 MG PO TABS
500.0000 mg | ORAL_TABLET | Freq: Four times a day (QID) | ORAL | Status: DC | PRN
  Administered 2024-01-28 – 2024-02-01 (×3): 500 mg via ORAL
  Filled 2024-01-28 (×3): qty 1

## 2024-01-28 MED ORDER — GABAPENTIN 100 MG PO CAPS
100.0000 mg | ORAL_CAPSULE | Freq: Every evening | ORAL | Status: DC | PRN
  Administered 2024-01-29 – 2024-02-01 (×2): 100 mg via ORAL
  Filled 2024-01-28 (×2): qty 1

## 2024-01-28 MED ORDER — MULTIVITAMINS PO CAPS: 1.0000 | ORAL_CAPSULE | Freq: Every day | ORAL | Status: DC

## 2024-01-28 MED ORDER — SODIUM CHLORIDE 0.9 % IV SOLN
2.0000 g | Freq: Every day | INTRAVENOUS | Status: DC
  Administered 2024-01-29 – 2024-01-31 (×3): 2 g via INTRAVENOUS
  Filled 2024-01-28 (×3): qty 20

## 2024-01-28 MED ORDER — INSULIN ASPART 100 UNIT/ML IJ SOLN
0.0000 [IU] | Freq: Three times a day (TID) | INTRAMUSCULAR | Status: DC
  Administered 2024-01-29 (×3): 2 [IU] via SUBCUTANEOUS
  Administered 2024-01-30: 5 [IU] via SUBCUTANEOUS
  Administered 2024-01-30 – 2024-01-31 (×2): 2 [IU] via SUBCUTANEOUS
  Administered 2024-01-31: 8 [IU] via SUBCUTANEOUS
  Administered 2024-01-31: 5 [IU] via SUBCUTANEOUS
  Administered 2024-02-01: 2 [IU] via SUBCUTANEOUS
  Administered 2024-02-01: 3 [IU] via SUBCUTANEOUS
  Administered 2024-02-01: 2 [IU] via SUBCUTANEOUS
  Administered 2024-02-02: 3 [IU] via SUBCUTANEOUS
  Filled 2024-01-28: qty 0.15

## 2024-01-28 MED ORDER — ACETAMINOPHEN 325 MG PO TABS
650.0000 mg | ORAL_TABLET | Freq: Once | ORAL | Status: AC
  Administered 2024-01-28: 650 mg via ORAL
  Filled 2024-01-28: qty 2

## 2024-01-28 MED ORDER — ADULT MULTIVITAMIN W/MINERALS CH
1.0000 | ORAL_TABLET | Freq: Every day | ORAL | Status: DC
  Administered 2024-01-29: 1 via ORAL
  Filled 2024-01-28: qty 1

## 2024-01-28 MED ORDER — VANCOMYCIN HCL IN DEXTROSE 1-5 GM/200ML-% IV SOLN
1000.0000 mg | Freq: Once | INTRAVENOUS | Status: AC
  Administered 2024-01-28: 1000 mg via INTRAVENOUS
  Filled 2024-01-28: qty 200

## 2024-01-28 MED ORDER — ONDANSETRON HCL 4 MG/2ML IJ SOLN: 4.0000 mg | Freq: Four times a day (QID) | INTRAMUSCULAR | Status: DC | PRN

## 2024-01-28 MED ORDER — SODIUM BICARBONATE 650 MG PO TABS
650.0000 mg | ORAL_TABLET | Freq: Two times a day (BID) | ORAL | Status: DC
  Administered 2024-01-28 – 2024-02-02 (×10): 650 mg via ORAL
  Filled 2024-01-28 (×11): qty 1

## 2024-01-28 MED ORDER — FUROSEMIDE 40 MG PO TABS
80.0000 mg | ORAL_TABLET | Freq: Every day | ORAL | Status: DC
  Administered 2024-01-29 – 2024-02-02 (×5): 80 mg via ORAL
  Filled 2024-01-28 (×5): qty 2

## 2024-01-28 MED ORDER — SODIUM CHLORIDE 0.9 % IV SOLN: 1.0000 g | Freq: Every day | INTRAVENOUS | Status: DC

## 2024-01-28 MED ORDER — ONDANSETRON HCL 4 MG PO TABS: 4.0000 mg | ORAL_TABLET | Freq: Four times a day (QID) | ORAL | Status: DC | PRN

## 2024-01-28 MED ORDER — METRONIDAZOLE 500 MG/100ML IV SOLN
500.0000 mg | Freq: Once | INTRAVENOUS | Status: AC
  Administered 2024-01-28: 500 mg via INTRAVENOUS
  Filled 2024-01-28: qty 100

## 2024-01-28 MED ORDER — SODIUM CHLORIDE 0.9 % IV SOLN
2.0000 g | Freq: Once | INTRAVENOUS | Status: AC
  Administered 2024-01-28: 2 g via INTRAVENOUS
  Filled 2024-01-28: qty 20

## 2024-01-28 MED ORDER — METRONIDAZOLE 500 MG/100ML IV SOLN
500.0000 mg | Freq: Two times a day (BID) | INTRAVENOUS | Status: DC
  Administered 2024-01-28 – 2024-01-31 (×7): 500 mg via INTRAVENOUS
  Filled 2024-01-28 (×7): qty 100

## 2024-01-28 NOTE — ED Provider Notes (Signed)
 [place] EMERGENCY DEPARTMENT AT [facility] Provider Note   CSN: [id number] Arrival date & time: 01/28/24  1317     Patient presents with: Wound Infection   Jordan [name] is a 78 y.o. female.   HPI 78 year old female presents with right foot infection.  She was sent in by her PCP.  Had cellulitis in the same leg last month, seem to resolve, now is having worsening symptoms since the beginning of the month.  She has had it wrapped and when she went to the doctor today it seems much more swollen though there is no significant pain per her.  No fevers that she knows of.  Today when the wrap was removed there were maggots seen on her foot.  Prior to Admission medications   Medication Sig Start Date End Date Taking? Authorizing Provider  Accu-Chek FastClix Lancets MISC Use to check blood sugar once daily. Dx Code E11.65 06/01/19   Burchette, Wolm ORN, MD  acetaminophen  (TYLENOL ) 500 MG tablet Take 500 mg by mouth every 6 (six) hours as needed for mild pain. Pain     [provider]  Alcohol  Swabs  PADS Use as directed 05/31/20   Burchette, Wolm ORN, MD  Blood Glucose Calibration (TRUE METRIX LEVEL 1) Low SOLN Use as directed 05/31/20   Burchette, Wolm ORN, MD  Blood Glucose Monitoring Suppl (ACCU-CHEK NANO SMARTVIEW) w/Device KIT Use to check blood glucose 2 times daily. 06/21/19   Burchette, Wolm ORN, MD  Blood Glucose Monitoring Suppl (TRUE METRIX METER) DEVI Use as directed 05/31/20   Burchette, Wolm ORN, MD  doxycycline  (VIBRA -TABS) 100 MG tablet Take 1 tablet (100 mg total) by mouth 2 (two) times daily. 01/09/24   Lue Elsie BROCKS, MD  Dulaglutide  (TRULICITY ) 0.75 MG/0.5ML SOAJ Inject 0.75 mg into the skin once a week.    [provider]  furosemide  (LASIX ) 40 MG tablet Take 80 mg by mouth daily.    [provider]  gabapentin  (NEURONTIN ) 100 MG capsule TAKE 1 TO 2 CAPSULES BY MOUTH AT NIGHT AS NEEDED FOR NERVE PAIN Patient taking differently: Take  100-200 mg by mouth at bedtime as needed (nerve pain). TAKE 1 TO 2 CAPSULES BY MOUTH AT NIGHT AS NEEDED FOR NERVE PAIN 09/09/23   Burchette, Wolm ORN, MD  glucose blood (ACCU-CHEK SMARTVIEW) test strip USE TO CHECK BLOOD GLUCOSE TWICE DAILY AS DIRECTED 05/31/18   Burchette, Wolm ORN, MD  losartan  (COZAAR ) 100 MG tablet TAKE 1 TABLET BY MOUTH DAILY 12/09/23   Burchette, Wolm ORN, MD  Multiple Vitamin (MULTIVITAMIN) capsule Take 1 capsule by mouth daily.    [provider]  Nystatin  (GERHARDT'S BUTT CREAM) CREA Apply 1 Application topically 2 (two) times daily. 01/09/24   Lue Elsie BROCKS, MD  nystatin  (MYCOSTATIN /NYSTOP ) powder Apply topically 3 (three) times daily. 01/09/24   Lue Elsie BROCKS, MD  simvastatin  (ZOCOR ) 40 MG tablet TAKE 1 TABLET BY MOUTH EVERY EVENING 12/09/23   Burchette, Wolm ORN, MD  TRUEplus Lancets 28G MISC Use as directed 05/31/20   Burchette, Wolm ORN, MD    Allergies: Morphine, Onglyza [saxagliptin ], Baclofen, Lisinopril, Metoclopramide hcl, and Penicillins    Review of Systems  Constitutional:  Negative for fever.  Musculoskeletal:  Positive for joint swelling.  Skin:  Positive for color change.    Updated Vital Signs BP (!) 176/70 (BP Location: Right Arm)   Pulse 60   Temp 97.6 F (36.4 C) (Oral)   Resp 18   Ht 5' 8 (1.727 m)  Wt 83.9 kg   SpO2 100%   BMI 28.13 kg/m   Physical Exam Vitals and nursing note reviewed.  Constitutional:      Appearance: She is well-developed.  HENT:     Head: Normocephalic and atraumatic.  Cardiovascular:     Rate and Rhythm: Normal rate and regular rhythm.     Pulses:          Dorsalis pedis pulses are 2+ on the right side.  Pulmonary:     Effort: Pulmonary effort is normal.  Musculoskeletal:     Comments: See picture.  The patient has significant swelling to the dorsum of her right foot with erythema.  There are maggots.  No significant tenderness.  No crepitus.  Skin:    General: Skin is warm and dry.   Neurological:     Mental Status: She is alert.     (all labs ordered are listed, but only abnormal results are displayed) Labs Reviewed  COMPREHENSIVE METABOLIC PANEL WITH GFR - Abnormal; Notable for the following components:      Result Value   CO2 16 (*)    Glucose, Bld 145 (*)    BUN 56 (*)    Creatinine, Ser 2.07 (*)    Albumin 3.0 (*)    GFR, Estimated 24 (*)    All other components within normal limits  CBC WITH DIFFERENTIAL/PLATELET - Abnormal; Notable for the following components:   RBC 3.64 (*)    Hemoglobin 9.6 (*)    HCT 33.1 (*)    MCHC 29.0 (*)    All other components within normal limits  CULTURE, BLOOD (ROUTINE X 2)  CULTURE, BLOOD (ROUTINE X 2)  I-STAT CG4 LACTIC ACID, ED    EKG: None  Radiology: DG Foot Complete Right Result Date: 01/28/2024 CLINICAL DATA:  92319 Cellulitis 92319. EXAM: RIGHT FOOT COMPLETE - 3+ VIEW COMPARISON:  Right ankle from 01/07/2024. FINDINGS: There is diffuse osteopenia of the visualized osseous structures. No acute fracture or dislocation. No aggressive osseous lesion. Old healed fracture deformity and threaded cortical screw in the distal tibia again seen. Mild diffuse degenerative changes of imaged joints with asymmetric moderate-to-severe involvement of first metatarsophalangeal joint. There is probable fusion of the ankle joint. There is extensive soft tissue swelling over the dorsum of the forefoot and midfoot. No focal soft tissue defect or air within the soft tissue. No radiopaque foreign bodies. IMPRESSION: *No acute osseous abnormality of the right foot. Extensive soft tissue swelling over the dorsum of the forefoot and midfoot. No focal soft tissue defect or air within the soft tissue. Electronically Signed   By: Ree Molt M.D.   On: 01/28/2024 14:45     Procedures   Medications Ordered in the ED  cefTRIAXone  (ROCEPHIN ) 2 g in sodium chloride  0.9 % 100 mL IVPB (0 g Intravenous Stopped 01/28/24 1540)    And   metroNIDAZOLE  (FLAGYL ) IVPB 500 mg (500 mg Intravenous New Bag/Given 01/28/24 1542)    And  vancomycin  (VANCOCIN ) IVPB 1000 mg/200 mL premix (1,000 mg Intravenous New Bag/Given 01/28/24 1546)                                    Medical Decision Making Amount and/or Complexity of Data Reviewed External Data Reviewed: notes.    Details: PCP notes earlier today Labs: ordered.    Details: Normal WBC.  Stable creatinine. Radiology: ordered and independent interpretation performed.  Details: No gas in the soft tissue  Risk Prescription drug management. Decision regarding hospitalization.   Patient presents with a right foot infection.  Has obvious cellulitis and there are maggots on the foot as well.  It is swollen but no crepitus.  No significant tenderness, patient is resting comfortably.  My suspicion for a deep space/necrotizing infection is pretty low.  Will start her on IV antibiotics and she will need admission to the hospitalist service. Discussed with Dr. Earley to admit.     Final diagnoses:  Cellulitis of right foot    ED Discharge Orders     None          Freddi Hamilton, MD 01/28/24 1616

## 2024-01-28 NOTE — Progress Notes (Signed)
   Subjective:    Patient ID: Jordan [name], female    DOB: March 22, 1946, 78 y.o.   MRN: [id number]  HPI Here with her husband for worsening infection in the right foot. She was in the ED last month for this. A CT ruled out osteomyelitis. She was sent home on Doxycycline . She then had a follow up visit with Dr. Micheal. Her PCP, on 01-15-24. Apparently the wound was looking better, no purulence was seen, and there was no foul odor. No further antibiotics were prescribed. She was told to change the dressing daily, and a referral was made to home health for a wound care team to see her. Per the patient they never heard from anyone about this. They have been changing th dressing every 3 days. She is here today because the foot is more swollen and is painful.    Review of Systems  Constitutional: Negative.   Respiratory: Negative.    Cardiovascular: Negative.   Skin:  Positive for wound.       Objective:   Physical Exam Constitutional:      Comments: In a wheelchair   Cardiovascular:     Rate and Rhythm: Normal rate and regular rhythm.     Pulses: Normal pulses.     Heart sounds: Normal heart sounds.  Pulmonary:     Effort: Pulmonary effort is normal.     Breath sounds: Normal breath sounds.  Skin:    Comments: The entire right foot is swollen, red, warm, and tender. There are several open wounds around the foot with purulent drainage. There is a foul odor. There are also numerous maggots crawling around over the foot.   Neurological:     Mental Status: She is alert.           Assessment & Plan:  She has a severe cellulitis on the right foot which has obvious tissue death and is covered with maggots. She needs to be admitted to the hospital for IV antibiotics and likely to have surgery to clean out the foot. We called EMS to transport her to [facility] ED from here.  Garnette Olmsted, MD

## 2024-01-28 NOTE — ED Notes (Signed)
 PT provided with sandwich and water per request.   Husband remains at bedside.

## 2024-01-28 NOTE — Telephone Encounter (Signed)
 FYI Only or Action Required?: FYI only for provider.  Patient was last seen in primary care on 01/15/2024 by Micheal Wolm ORN, MD.  Called Nurse Triage reporting Pain.  Symptoms began yesterday.  Interventions attempted: OTC medications: advil  and removed dressing .  Symptoms are: gradually worsening.  Triage Disposition: See HCP Within 4 Hours (Or PCP Triage)  Patient/caregiver understands and will follow disposition?: Yes           Copied from CRM #[id number]. Topic: Clinical - Red Word Triage >> Jan 28, 2024  8:47 AM Jordan [name] wrote: Red Word that prompted transfer to Nurse Triage: Pt husband called saying the pt has a bandage on her right ankle. Pt has a high pain threshold but the pain has become intolerable. Warm transfer to [facility] triage nurse Reason for Disposition  [1] SEVERE pain (e.g., excruciating, unable to walk) AND [2] not improved after 2 hours of pain medicine  Answer Assessment - Initial Assessment Questions Appt scheduled today with other provider. None available with PCP. Severe pain reported by patient's husband on DPR.       1. ONSET: When did the pain start?      Yesterday / last night  2. LOCATION: Where is the pain located?      Right ankle  3. PAIN: How bad is the pain?  (Scale 1-10; or mild, moderate, severe)     severe 4. WORK OR EXERCISE: Has there been any recent work or exercise that involved this part of the body?      na 5. CAUSE: What do you think is causing the ankle pain?     Right ankle wound 6. OTHER SYMPTOMS: Do you have any other symptoms? (e.g., calf pain, rash, fever, swelling)     Sharp pain  , dressing removed from ankle wound last night per husband report. Advil  not helping with pain. Patient is nonambulatory  7. PREGNANCY: Is there any chance you are pregnant? When was your last menstrual period?     na  Protocols used: Ankle Pain-A-AH

## 2024-01-28 NOTE — Consult Note (Signed)
 Patient ID: Jordan [name] MRN: [id number] DOB/AGE: Jul 23, 1945 78 y.o.  Admit date: 01/28/2024  Admission Diagnoses:  Principal Problem:   Cellulitis of left lower extremity Active Problems:   Essential hypertension   Infantile cerebral palsy (HCC)   Type 2 diabetes mellitus (HCC)   History of total cystectomy   Type 2 diabetes mellitus with diabetic chronic kidney disease (HCC)   CKD (chronic kidney disease) stage 4, GFR 15-29 ml/min (HCC)   HPI: Ortho consult for right chronic leg wounds with repeat maggot infestation. The patient and husband report she has always had right dorsum foot swelling but that right limb swelling has been worse over the past couple months. She was seen in PCP office today and sent to ER for workup.  PMH notable for diabetes mellitus, chronic kidney disease stage IV, cerebral palsy.  Denies any new numbness/tingling. Stable on RNF.  Past Medical History: 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   PT CAN TRANSFER BED TO CHAIR--CAN STAND BRIEFLY--BUT NOT ABLE TO WALK; RT LEG HYPEREXTENDS BACKWARD AND IS WEAK-HAS A POWER CHAIR   COLONIC POLYPS 07/11/2010   Constipation    Diabetes mellitus    ORAL MED FOR DIABETES   Gallbladder problem    GERD (gastroesophageal reflux disease)    PAST HISTORY GERD--WATCHES DIET - NOT ON ANY MEDS FOR GERD   HYPERLIPIDEMIA 11/29/2009   HYPERTENSION 11/29/2009   Incisional hernia    NO PAIN-NOT CAUSING ANY DISCOMFORT   Kidney infection    Osteoarthritis    Osteomyelitis (HCC)    PONV (postoperative nausea and vomiting)    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HCC)    11-28-14 remains   Shortness of breath    Stroke (cerebrum) (HCC)     Surgical History: Past Surgical History:  Procedure Laterality Date   1992     ABDOMINAL HYSTERECTOMY  1990   partial   ACHILLES  TENDON LENGTHENING  1962   ADENOIDECTOMY  1950   APPENDECTOMY     CARPAL TUNNEL RELEASE  1983   CHOLECYSTECTOMY     COLONOSCOPY WITH PROPOFOL  N/A 12/07/2014   Procedure: COLONOSCOPY WITH PROPOFOL ;  Surgeon: Lupita FORBES Commander, MD;  Location: [facility];  Service: Endoscopy;  Laterality: N/A;   LEFT CARPAL TUNNEL RELEASE X 2     LUMBAR SYMPATHETECTOMIES X 2     MULTIPLE RIGHT ANKLE SURGERIES     NEPHROLITHOTOMY  10/01/2011   Procedure: NEPHROLITHOTOMY PERCUTANEOUS;  Surgeon: Garnette Shack, MD;  Location: [facility];  Service: Urology;  Laterality: Right;        PARTIAL AMPUTATION OF LEFT GREAT TOE     1985   radical cystectomy  1992   neurogenic bladder sec CP   REPAIR OF NASAL FRACTURE  1985   REVISION UROSTOMY CUTANEOUS  1992   RIGHT CARPAL TUNNEL RELEASE     RIGHT HIP FLEXION CONTRACTURE REPAIR  1986   RT ANKLE FUSION  1975   SEVERAL RT ANKLE SURGERIES PRIOR TO THE FUSION   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--AT [facility]      TONSILLECTOMY     TUBAL LIGATION  1975   VAGINAL HYSTERECTOMY  1990   WISDOM TOOTH EXTRACTION  1971    Family History: Family History  Problem Relation Age of  Onset   Heart disease Mother 70       COPD   Hypertension Mother    Hyperlipidemia Mother    Liver disease Mother    Alcoholism Mother    Heart disease Father        COPD    Emphysema Father    Colon cancer Father 86       Died at 50   Bladder Cancer Father    Diabetes Father    Hyperlipidemia Father    Cancer Father    Liver disease Father    Sleep apnea Father    Alcoholism Father    Colon polyps Sister    Heart disease Maternal Grandmother    Heart attack Maternal Grandfather    Esophageal cancer Neg Hx    Kidney disease Neg Hx     Social History: Social History   Socioeconomic History   Marital status: Married    Spouse name: Not on file   Number of children: 1   Years of education: Not on file   Highest education level: Master's degree (e.g., MA, MS, MEng, MEd, MSW,  MBA)  Occupational History   Occupation: Veterinary surgeon; Copywriter, advertising  Tobacco Use   Smoking status: Never   Smokeless tobacco: Never  Vaping Use   Vaping status: Never Used  Substance and Sexual Activity   Alcohol  use: No    Alcohol /week: 0.0 standard drinks of alcohol    Drug use: No   Sexual activity: Not on file  Other Topics Concern   Not on file  Social History Narrative   Married   Publishing copy and Journalist, newspaper - works part time at [employer]   Social Drivers of [employer]: Low Risk  (04/15/2022)   Overall Financial Resource Strain (CARDIA)    Difficulty of Paying Living Expenses: Not hard at all  Food Insecurity: No Food Insecurity (01/28/2024)   Hunger Vital Sign    Worried About Running Out of Food in the Last Year: Never true    Ran Out of Food in the Last Year: Never true  Transportation Needs: No Transportation Needs (01/28/2024)   PRAPARE - Administrator, Civil Service (Medical): No    Lack of Transportation (Non-Medical): No  Physical Activity: Insufficiently Active (04/15/2022)   Exercise Vital Sign    Days of Exercise per Week: 4 days    Minutes of Exercise per Session: 30 min  Stress: No Stress Concern Present (04/15/2022)   [employer] of Occupational Health - Occupational Stress Questionnaire    Feeling of Stress : Not at all  Social Connections: Socially Integrated (01/28/2024)   Social Connection and Isolation Panel    Frequency of Communication with Friends and Family: Three times a week    Frequency of Social Gatherings with Friends and Family: Twice a week    Attends Religious Services: More than 4 times per year    Active Member of [employer] or Organizations: Yes    Attends Engineer, structural: More than 4 times per year    Marital Status: Married  Catering manager Violence: Not At Risk (01/28/2024)   Humiliation, Afraid, Rape, and Kick questionnaire    Fear of Current or Ex-Partner: No     Emotionally Abused: No    Physically Abused: No    Sexually Abused: No    Allergies: Morphine, Onglyza [saxagliptin ], Baclofen, Lisinopril, Metoclopramide hcl, and Penicillins  Medications: I have reviewed the patient's current medications.  Vital Signs: Patient Vitals  for the past 24 hrs:  BP Temp Temp src Pulse Resp SpO2 Height Weight  01/28/24 1806 (!) 124/52 97.8 F (36.6 C) Oral 72 18 100 % -- --  01/28/24 1708 -- 98.1 F (36.7 C) -- -- -- -- -- --  01/28/24 1600 (!) 152/61 -- -- (!) 57 20 100 % -- --  01/28/24 1530 (!) 144/61 -- -- (!) 59 -- 100 % -- --  01/28/24 1327 -- -- -- -- -- -- 5' 8 (1.727 m) 83.9 kg  01/28/24 1326 (!) 176/70 97.6 F (36.4 C) Oral 60 18 100 % -- --  01/28/24 1324 -- 97.6 F (36.4 C) Oral 72 18 100 % -- --    Radiology: DG Foot Complete Right Result Date: 01/28/2024 CLINICAL DATA:  92319 Cellulitis 92319. EXAM: RIGHT FOOT COMPLETE - 3+ VIEW COMPARISON:  Right ankle from 01/07/2024. FINDINGS: There is diffuse osteopenia of the visualized osseous structures. No acute fracture or dislocation. No aggressive osseous lesion. Old healed fracture deformity and threaded cortical screw in the distal tibia again seen. Mild diffuse degenerative changes of imaged joints with asymmetric moderate-to-severe involvement of first metatarsophalangeal joint. There is probable fusion of the ankle joint. There is extensive soft tissue swelling over the dorsum of the forefoot and midfoot. No focal soft tissue defect or air within the soft tissue. No radiopaque foreign bodies. IMPRESSION: *No acute osseous abnormality of the right foot. Extensive soft tissue swelling over the dorsum of the forefoot and midfoot. No focal soft tissue defect or air within the soft tissue. Electronically Signed   By: Ree Molt M.D.   On: 01/28/2024 14:45   VAS US  ABI WITH/WO TBI Result Date: 01/08/2024  LOWER EXTREMITY DOPPLER STUDY Patient Name:  Suzane Vanderweide  Date of Exam:   01/07/2024  Medical Rec #: [id number]     Accession #:    [id number] Date of Birth: 06/22/1945     Patient Gender: F Patient Age:   37 years Exam Location:  [facility] [place] Procedure:      VAS US  ABI WITH/WO TBI Referring Phys: --------------------------------------------------------------------------------  Indications: Ulceration. High Risk Factors: Hypertension, hyperlipidemia, Diabetes, no history of                    smoking, prior CVA. Other Factors: CKD, chronic venous stasis.  Limitations: Today's exam was limited due to an open wound and tissue/skin              properties. Comparison Study: No previous exams on file Performing Technologist: Jody Hill RVT, RDMS  Examination Guidelines: A complete evaluation includes at minimum, Doppler waveform signals and systolic blood pressure reading at the level of bilateral brachial, anterior tibial, and posterior tibial arteries, when vessel segments are accessible. Bilateral testing is considered an integral part of a complete examination. Photoelectric Plethysmograph (PPG) waveforms and toe systolic pressure readings are included as required and additional duplex testing as needed. Limited examinations for reoccurring indications may be performed as noted.  ABI Findings: +---------+------------------+-----+----------+--------+ Right    Rt Pressure (mmHg)IndexWaveform  Comment  +---------+------------------+-----+----------+--------+ Brachial 161                    triphasic          +---------+------------------+-----+----------+--------+ PTA                             monophasic         +---------+------------------+-----+----------+--------+ DP  biphasic           +---------+------------------+-----+----------+--------+ Burnetta Pillow               0.76 Normal             +---------+------------------+-----+----------+--------+ +---------+------------------+-----+---------+-------+ Left     Lt Pressure  (mmHg)IndexWaveform Comment +---------+------------------+-----+---------+-------+ Brachial 155                    biphasic         +---------+------------------+-----+---------+-------+ PTA      143               0.89 biphasic         +---------+------------------+-----+---------+-------+ DP       147               0.91 triphasic        +---------+------------------+-----+---------+-------+ Great Toe108               0.67 Normal           +---------+------------------+-----+---------+-------+  Summary: Right: The right toe-brachial index is normal. Unable to obtain ABO due to open wounds. Left: Resting left ankle-brachial is normal. The left toe-brachial index is mildly abnormal. *See table(s) above for measurements and observations.  Electronically signed by Norman Serve on 01/08/2024 at 3:17:19 PM.    Final    CT ANKLE RIGHT WO CONTRAST Result Date: 01/08/2024 CLINICAL DATA:  Ankle diabetic ulcer. Hx of stage 4 CKD. EXAM: CT OF THE RIGHT ANKLE WITHOUT CONTRAST TECHNIQUE: Multidetector CT imaging of the right ankle was performed according to the standard protocol. Multiplanar CT image reconstructions were also generated. RADIATION DOSE REDUCTION: This exam was performed according to the departmental dose-optimization program which includes automated exposure control, adjustment of the mA and/or kV according to patient size and/or use of iterative reconstruction technique. COMPARISON:  Right ankle radiographs dated 01/07/2024. FINDINGS: Bones/Joint/Cartilage There is chronic fusion of the tibiotalar, talocalcaneal, calcaneocuboid, and talonavicular joints with evidence of remote arthrodesis. A single intact posteriorly oriented screw traverses the distal tibial metaphysis. No evidence of acute osteolysis or erosive changes. Hypertrophic osseous changes along the lateral base of the cuboid with adjacent 9 mm well corticated ossicle which could reflect an os perineum or relate to prior  trauma. TMT joints appear aligned with mild-to-moderate joint space narrowing. Degenerative spurring at the level of the navicular-cuneiform articulation. Ligaments Ligaments are suboptimally evaluated by CT. Muscles and Tendons Diffuse atrophy of the musculature, which could reflect chronic denervation changes. Soft tissue Cutaneous thickening and irregularity with soft tissue swelling of the ankle extending through the dorsal foot. More focal region of cutaneous thickening with possible ulceration along the posterior ankle. No loculated fluid collection. No soft tissue gas. IMPRESSION: 1. Cutaneous thickening and irregularity with soft tissue swelling of the ankle extending through the dorsal foot. More focal region of cutaneous thickening with possible ulceration along the posterior ankle. No abscess. No soft tissue gas. 2. No acute osseous abnormality. 3. Chronic fusion of the hindfoot with evidence of remote arthrodesis. 4. Mild-to-moderate osteoarthritis of the midfoot. 5. Diffuse atrophy of the musculature, which could reflect chronic denervation changes. Electronically Signed   By: Harrietta Sherry M.D.   On: 01/08/2024 09:17   DG Ankle Complete Right Result Date: 01/07/2024 EXAM: 3 or more VIEW(S) XRAY OF THE RIGHT ANKLE 01/07/2024 03:44:20 AM CLINICAL HISTORY: Osteo. Edema, oozing sores to ankle. COMPARISON: None available. FINDINGS: BONES AND JOINTS: Sclerosis and deformity involving the distal tibia with tibiotalar  fusion, posttraumatic, with indwelling screw. Superimposed infection and chronic osteomyelitis are difficult to exclude. SOFT TISSUES: The soft tissues are unremarkable. IMPRESSION: 1. Sclerosis/deformity involving the distal tibia with tibiotalar fusion, posttraumatic, with indwelling screw. 2. Superimposed infection/chronic osteomyelitis is difficult to exclude. Electronically signed by: Pinkie Pebbles MD 01/07/2024 03:53 AM EDT RP Workstation: [id number]    Labs: Recent Labs     01/28/24 1428 01/28/24 1825  WBC 8.9 8.9  RBC 3.64* 3.17*  HCT 33.1* 29.3*  PLT 335 277   Recent Labs    01/28/24 1428 01/28/24 1825  NA 137  --   K 4.5  --   CL 111  --   CO2 16*  --   BUN 56*  --   CREATININE 2.07* 2.07*  GLUCOSE 145*  --   CALCIUM 8.9  --    No results for input(s): LABPT, INR in the last 72 hours.  Review of Systems: ROS as detailed in HPI  Physical Exam: Body mass index is 28.13 kg/m.  Physical Exam   Gen: AAOx3, NAD Comfortable at rest  Right Lower Extremity: Moderate lymphedema with some erythema and some superficial ulcerations over distal leg TTP over dorsum midfoot ADF/APF/EHL 5/5 SILT throughout CR < 3s   Assessment and Plan: Ortho consult for right chronic leg wounds with repeat maggot infestation  -history, exam and imaging reviewed at length with patient/husband -I explained that she is at high risk of right below knee amputation at this time. She is adamantly opposed to this and her husband states they will need a lot more convincing before agreeing to any form of surgical intervention. Patient says she feels like the discussion of amputation as an option is a slap in the face and she has no intention of agreeing to it today. She is open to a second opinion from another surgeon to discuss further -pt is agreeable to obtain Vascular consult for amputation due to peripheral arterial disease (calcified arteries on radiographs, CKD stage IV, inability to complete ABI on RLE) -wound consult  Lillia Mountain, MD Orthopaedic Surgeon [facility] [phone]  The risks and benefits were presented and reviewed. The risks due to suture failure/irritation, new/persistent/recurrent infection, stiffness, nerve/vessel/tendon injury, nonunion/malunion of any fracture, wound healing issues, allograft usage, development of arthritis, failure of this surgery, possibility of wound vac in certain situations, possibility of delayed  definitive surgery, need for further surgery, prolonged wound care including further soft tissue coverage procedures, thromboembolic events, anesthesia/medical complications/events perioperatively and beyond, amputation, death among others were discussed. The patient acknowledged the explanation but is not agreeable to proceed with surgery at this time.

## 2024-01-28 NOTE — H&P (Addendum)
 History and Physical    Jordan [name]  FMW:[id number]  DOB: 1946/02/04  DOA: 01/28/2024 PCP: Micheal Wolm ORN, MD   Patient coming from: Home  Chief Complaint: Wound on right foot and leg  HPI: Jordan [name] is a 78 y.o. female with medical history of diabetes mellitus, chronic kidney disease stage IV, cerebral palsy is sent to the hospital by her PCP for a wound on right foot and leg.  The patient states that was wrapped at the PCP office and she was recommended to continue the wrapping for 10 days without opening.  It was unwrapped today and she was found to have a wound with maggots for which she was sent to the ED.  She does not complain of any fevers.  She has pain mainly in the dorsum of the right foot.  She has no other complaints.  She states that she completed her antibiotics from her prior hospital stay and has been taking her medications appropriately.  ED Course:  X-ray of the right foot reveals extensive soft tissue swelling over the dorsum of the forefoot and midfoot.  No focal soft tissue defect or air within the soft tissue  ceftriaxone , metronidazole  and vancomycin  ordered.  Review of Systems:  All other systems reviewed and apart from HPI, are negative.  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   PT CAN TRANSFER BED TO CHAIR--CAN STAND BRIEFLY--BUT NOT ABLE TO WALK; RT LEG HYPEREXTENDS BACKWARD AND IS WEAK-HAS A POWER CHAIR   COLONIC POLYPS 07/11/2010   Constipation    Diabetes mellitus    ORAL MED FOR DIABETES   Gallbladder problem    GERD (gastroesophageal reflux disease)    PAST HISTORY GERD--WATCHES DIET - NOT ON ANY MEDS FOR GERD   HYPERLIPIDEMIA 11/29/2009   HYPERTENSION 11/29/2009   Incisional hernia    NO PAIN-NOT CAUSING ANY DISCOMFORT   Kidney infection    Osteoarthritis    Osteomyelitis (HCC)     PONV (postoperative nausea and vomiting)    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HCC)    11-28-14 remains   Shortness of breath    Stroke (cerebrum) (HCC)     Past Surgical History:  Procedure Laterality Date   1992     ABDOMINAL HYSTERECTOMY  1990   partial   ACHILLES TENDON LENGTHENING  1962   ADENOIDECTOMY  1950   APPENDECTOMY     CARPAL TUNNEL RELEASE  1983   CHOLECYSTECTOMY     COLONOSCOPY WITH PROPOFOL  N/A 12/07/2014   Procedure: COLONOSCOPY WITH PROPOFOL ;  Surgeon: Lupita FORBES Commander, MD;  Location: [facility];  Service: Endoscopy;  Laterality: N/A;   LEFT CARPAL TUNNEL RELEASE X 2     LUMBAR SYMPATHETECTOMIES X 2     MULTIPLE RIGHT ANKLE SURGERIES     NEPHROLITHOTOMY  10/01/2011   Procedure: NEPHROLITHOTOMY PERCUTANEOUS;  Surgeon: Garnette Shack, MD;  Location: [facility];  Service: Urology;  Laterality: Right;        PARTIAL AMPUTATION OF LEFT GREAT TOE     1985   radical cystectomy  1992   neurogenic bladder sec CP   REPAIR OF NASAL FRACTURE  1985   REVISION UROSTOMY CUTANEOUS  1992   RIGHT CARPAL TUNNEL RELEASE     RIGHT HIP FLEXION CONTRACTURE REPAIR  1986  RT ANKLE FUSION  1975   SEVERAL RT ANKLE SURGERIES PRIOR TO THE FUSION   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--AT [facility]      TONSILLECTOMY     TUBAL LIGATION  1975   VAGINAL HYSTERECTOMY  1990   WISDOM TOOTH EXTRACTION  1971    Social History:   reports that she has never smoked. She has never used smokeless tobacco. She reports that she does not drink alcohol  and does not use drugs.  Allergies  Allergen Reactions   Morphine Shortness Of Breath   Onglyza [Saxagliptin ] Other (See Comments)    Headache within 45 minutes of taking this.   Baclofen     GI upset   Lisinopril Cough   Metoclopramide Hcl     Reglan, irritable   Penicillins      MAJOR CASE OF HIVES    Family History  Problem Relation Age of Onset   Heart disease Mother 85       COPD   Hypertension Mother    Hyperlipidemia Mother    Liver  disease Mother    Alcoholism Mother    Heart disease Father        COPD    Emphysema Father    Colon cancer Father 48       Died at 24   Bladder Cancer Father    Diabetes Father    Hyperlipidemia Father    Cancer Father    Liver disease Father    Sleep apnea Father    Alcoholism Father    Colon polyps Sister    Heart disease Maternal Grandmother    Heart attack Maternal Grandfather    Esophageal cancer Neg Hx    Kidney disease Neg Hx      Prior to Admission medications   Medication Sig Start Date End Date Taking? Authorizing Provider  Accu-Chek FastClix Lancets MISC Use to check blood sugar once daily. Dx Code E11.65 06/01/19   Burchette, Wolm ORN, MD  acetaminophen  (TYLENOL ) 500 MG tablet Take 500 mg by mouth every 6 (six) hours as needed for mild pain. Pain     [provider]  Alcohol  Swabs  PADS Use as directed 05/31/20   Burchette, Wolm ORN, MD  Blood Glucose Calibration (TRUE METRIX LEVEL 1) Low SOLN Use as directed 05/31/20   Burchette, Wolm ORN, MD  Blood Glucose Monitoring Suppl (ACCU-CHEK NANO SMARTVIEW) w/Device KIT Use to check blood glucose 2 times daily. 06/21/19   Burchette, Wolm ORN, MD  Blood Glucose Monitoring Suppl (TRUE METRIX METER) DEVI Use as directed 05/31/20   Burchette, Wolm ORN, MD  doxycycline  (VIBRA -TABS) 100 MG tablet Take 1 tablet (100 mg total) by mouth 2 (two) times daily. 01/09/24   Lue Elsie BROCKS, MD  Dulaglutide  (TRULICITY ) 0.75 MG/0.5ML SOAJ Inject 0.75 mg into the skin once a week.    [provider]  furosemide  (LASIX ) 40 MG tablet Take 80 mg by mouth daily.    [provider]  gabapentin  (NEURONTIN ) 100 MG capsule TAKE 1 TO 2 CAPSULES BY MOUTH AT NIGHT AS NEEDED FOR NERVE PAIN Patient taking differently: Take 100-200 mg by mouth at bedtime as needed (nerve pain). TAKE 1 TO 2 CAPSULES BY MOUTH AT NIGHT AS NEEDED FOR NERVE PAIN 09/09/23   Burchette, Wolm ORN, MD  glucose blood (ACCU-CHEK SMARTVIEW) test strip USE TO CHECK  BLOOD GLUCOSE TWICE DAILY AS DIRECTED 05/31/18   Burchette, Wolm ORN, MD  losartan  (COZAAR ) 100 MG tablet TAKE 1 TABLET BY MOUTH DAILY  12/09/23   Burchette, Wolm ORN, MD  Multiple Vitamin (MULTIVITAMIN) capsule Take 1 capsule by mouth daily.    [provider]  Nystatin  (GERHARDT'S BUTT CREAM) CREA Apply 1 Application topically 2 (two) times daily. 01/09/24   Lue Elsie BROCKS, MD  nystatin  (MYCOSTATIN /NYSTOP ) powder Apply topically 3 (three) times daily. 01/09/24   Lue Elsie BROCKS, MD  simvastatin  (ZOCOR ) 40 MG tablet TAKE 1 TABLET BY MOUTH EVERY EVENING 12/09/23   Burchette, Wolm ORN, MD  TRUEplus Lancets 28G MISC Use as directed 05/31/20   Micheal Wolm ORN, MD    Physical Exam: Wt Readings from Last 3 Encounters:  01/28/24 83.9 kg  01/28/24 83.9 kg  01/08/24 99.8 kg   Vitals:   01/28/24 1326 01/28/24 1327 01/28/24 1530 01/28/24 1600  BP: (!) 176/70  (!) 144/61 (!) 152/61  Pulse: 60  (!) 59 (!) 57  Resp: 18   20  Temp: 97.6 F (36.4 C)     TempSrc: Oral     SpO2: 100%  100% 100%  Weight:  83.9 kg    Height:  5' 8 (1.727 m)        Constitutional:  Calm & comfortable Eyes: PERRLA, lids and conjunctivae normal-very hard of hearing ENT:  Mucous membranes are moist.  Pharynx clear of exudate   Normal dentition.  Respiratory:  Clear to auscultation bilaterally  Normal respiratory effort.  Cardiovascular:  S1 & S2 heard, regular rate and rhythm No Murmurs Abdomen:  Non distended No tenderness, No masses Urostomy present in left lower abdomen Bowel sounds normal Extremities:          Skin:  No rashes, lesions or ulcers Neurologic:  AAO x 3 CN 2-12 grossly intact Sensation intact Strength 5/5 in all 4 extremities Psychiatric:  Normal Mood and affect   Labs on Admission: I have personally reviewed labs and imaging studies   Assessment/Plan Principal Problem:   Cellulitis of left lower extremity - Extensive cellulitis with ulceration and  maggots-no fever or leukocytosis -She had a hospital admission from 7/23 through 7/26 for an ulcer on her ankle and was discharged home with doxycycline  which she states she took appropriately until course was completed - C-reactive protein and ESR ordered - Started on broad-spectrum antibiotics today - Ortho consult requested for possible I&D - Addendum: Ortho recommended MRIs of LE with and w/o contrast which I have ordered  Active Problems:   Essential hypertension - Continue losartan     Type 2 diabetes mellitus (HCC) - Last hemoglobin A1c on 7/7 was 8.2 - Placed on sliding scale insulin  - She takes Trulicity  at home which will be held for now    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

## 2024-01-28 NOTE — ED Triage Notes (Signed)
 Patient c/o worsening wound infection on her right foot. Patient report she was at her PCP and they recommended to be seen In ED for further workup . Patient denies N/V. Patient denies fever. Patient denies Chest pain and SOB.

## 2024-01-29 ENCOUNTER — Inpatient Hospital Stay (HOSPITAL_COMMUNITY)

## 2024-01-29 DIAGNOSIS — N189 Chronic kidney disease, unspecified: Secondary | ICD-10-CM

## 2024-01-29 DIAGNOSIS — I129 Hypertensive chronic kidney disease with stage 1 through stage 4 chronic kidney disease, or unspecified chronic kidney disease: Secondary | ICD-10-CM

## 2024-01-29 DIAGNOSIS — E1159 Type 2 diabetes mellitus with other circulatory complications: Secondary | ICD-10-CM

## 2024-01-29 DIAGNOSIS — E1122 Type 2 diabetes mellitus with diabetic chronic kidney disease: Secondary | ICD-10-CM

## 2024-01-29 DIAGNOSIS — L03116 Cellulitis of left lower limb: Secondary | ICD-10-CM | POA: Diagnosis not present

## 2024-01-29 DIAGNOSIS — E785 Hyperlipidemia, unspecified: Secondary | ICD-10-CM

## 2024-01-29 DIAGNOSIS — M7989 Other specified soft tissue disorders: Secondary | ICD-10-CM

## 2024-01-29 LAB — BASIC METABOLIC PANEL WITH GFR
Anion gap: 11 (ref 5–15)
BUN: 54 mg/dL — ABNORMAL HIGH (ref 8–23)
CO2: 13 mmol/L — ABNORMAL LOW (ref 22–32)
Calcium: 8.6 mg/dL — ABNORMAL LOW (ref 8.9–10.3)
Chloride: 111 mmol/L (ref 98–111)
Creatinine, Ser: 2.03 mg/dL — ABNORMAL HIGH (ref 0.44–1.00)
GFR, Estimated: 25 mL/min — ABNORMAL LOW (ref >60)
Glucose, Bld: 138 mg/dL — ABNORMAL HIGH (ref 70–99)
Potassium: 4.2 mmol/L (ref 3.5–5.1)
Sodium: 135 mmol/L (ref 135–145)

## 2024-01-29 LAB — GLUCOSE, CAPILLARY
Glucose-Capillary: 126 mg/dL — ABNORMAL HIGH (ref 70–99)
Glucose-Capillary: 141 mg/dL — ABNORMAL HIGH (ref 70–99)
Glucose-Capillary: 131 mg/dL — ABNORMAL HIGH (ref 70–99)
Glucose-Capillary: 104 mg/dL — ABNORMAL HIGH (ref 70–99)

## 2024-01-29 MED ORDER — ZINC SULFATE 220 (50 ZN) MG PO CAPS
220.0000 mg | ORAL_CAPSULE | Freq: Every day | ORAL | Status: DC
  Administered 2024-01-29 – 2024-02-02 (×5): 220 mg via ORAL
  Filled 2024-01-29 (×5): qty 1

## 2024-01-29 MED ORDER — ENSURE MAX PROTEIN PO LIQD
11.0000 [oz_av] | Freq: Every day | ORAL | Status: DC
  Administered 2024-01-30 – 2024-02-02 (×4): 11 [oz_av] via ORAL

## 2024-01-29 MED ORDER — ORAL CARE MOUTH RINSE
15.0000 mL | OROMUCOSAL | Status: DC | PRN
Start: 2024-01-29 — End: 2024-02-02

## 2024-01-29 MED ORDER — B COMPLEX-C PO TABS
1.0000 | ORAL_TABLET | Freq: Every day | ORAL | Status: DC
  Administered 2024-01-30 – 2024-02-02 (×4): 1 via ORAL
  Filled 2024-01-29 (×4): qty 1

## 2024-01-29 NOTE — Consult Note (Signed)
 WOC Nurse Consult Note: Reason for Consult:RLE cellulitis with ulcers Wound type: full thickness and partial thickness wounds in the presence of lymphedema and LE mixed arterial/venous disease  Foot and ankle deformity/cerebral palsy  Chronic issue. Orthopedics has discussed need for amputation in the setting of multiple comorbid conditions and risk factors. Patient has refused; would like second opinion.  Vascular surgery consult pending.  Pressure Injury POA: NA Measurement: see nursing flow sheets Wound bed: some yellow open areas noted malleolar region; anterior Pink partial thickness opening noted lateral malleolar region; 100% pink Poor hygiene with crusting between the toes; eschar like between the great toe and 2nd toe Drainage (amount, consistency, odor) see nursing flow sheets Periwound: intact; venous dermatitis  Dressing procedure/placement/frequency: Cleanse right foot and ankle wound and between the toes with Carolynn Soila # [phone]).  Weave dry silver hydrofiber (cut into strips) between the toes Soila [phone]) Apply single layer of xeroform to the open weeping areas on the ankle and pretibial areas.  Cover with dry dressing and secure with kerlix.  Change daily    Re consult if needed, will not follow at this time. Thanks  Leva Baine [employer], RN,CWOCN, CNS, [employer] [phone]

## 2024-01-29 NOTE — Progress Notes (Addendum)
 [facility] Progress Note  Patient: Jordan [name]     FMW:[id number]  DOA: 01/28/2024   PCP: Jordan [name] ORN, MD       Brief hospital course: HPI: Jordan [name] is a 78 y.o. female with medical history of diabetes mellitus, chronic kidney disease stage IV, cerebral palsy is sent to the hospital by her PCP for a wound on right foot and leg.  The patient states that was wrapped at the PCP office and she was recommended to continue the wrapping for 10 days without opening.  It was unwrapped today and she was found to have a wound with maggots for which she was sent to the ED.  She does not complain of any fevers.  She has pain mainly in the dorsum of the right foot.  She has no other complaints.  She states that she completed her antibiotics from her prior hospital stay and has been taking her medications appropriately.  ED Course:  X-ray of the right foot reveals extensive soft tissue swelling over the dorsum of the forefoot and midfoot.  No focal soft tissue defect or air within the soft tissue  ceftriaxone , metronidazole  and vancomycin  ordered.  Subjective:  Has no new complaints.  Adamantly denies that anyone discussed an amputation with her yesterday and is very angry that it has even been suggested.  Her husband is at bedside and states that amputation was discussed.  The patient is declining MRIs because she is claustrophobic.  She understands that her head and shoulders will not be inside the machine.  She has been offered Ativan but declines this as well.    Assessment and Plan: Principal Problem:   Cellulitis of left lower extremity - Allergic to penicillins-started on ceftriaxone , vancomycin  and metronidazole  - Extensive cellulitis with ulceration and maggots-no fever or leukocytosis -She had a hospital admission from 7/23 through 7/26 for an ulcer on her ankle and was discharged home with doxycycline  which she states she took appropriately until course was completed -  Orthopedic surgery consult requested-recommendations are for amputation and according to the note from Dr Jordan [name], the patient stated that an amputation as an option is a slap in the face and she has no intention of agreeing to it today.   -Vascular surgery consult requested  -Patient has declined MRIs of the foot and ankle and leg  Active Problems:  Dementia?  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     CKD (chronic kidney disease) stage 4, GFR 15-29 ml/min (HCC) -Creatinine appears to be at baseline which ranges from 1.9-2.2   Chronic metabolic acidosis - cont bicarbonate tabs-650 mg twice daily - check lactic acid level   History of cerebral palsy and CVA -Has motorized wheelchair      Code Status: Full Code Total time on patient care: 40 minutes DVT prophylaxis:  enoxaparin  (LOVENOX ) injection 30 mg Start: 01/28/24 2200     Objective:   Vitals:   01/28/24 1806 01/28/24 2120 01/29/24 0535 01/29/24 1344  BP: (!) 124/52 (!) 142/52 (!) 140/57 (!) 148/55  Pulse: 72 69 60 72  Resp: 18 18 16 18   Temp: 97.8 F (36.6 C) 98.1  F (36.7 C) 98.3 F (36.8 C) 98.1 F (36.7 C)  TempSrc: Oral Oral Oral Oral  SpO2: 100% 100% 99% 100%  Weight:      Height:       Filed Weights   01/28/24 1327  Weight: 83.9 kg   Exam: General exam: Appears comfortable  HEENT: oral mucosa moist Respiratory system: Clear to auscultation.  Cardiovascular system: S1 & S2 heard  Gastrointestinal system: Abdomen soft, non-tender, nondistended. Normal bowel sounds   Extremities: Extensive erythema, edema and scabs on right leg and foot Psychiatry:  Mood & affect appropriate.       CBC: Recent Labs  Lab 01/28/24 1428 01/28/24 1825  WBC 8.9 8.9  NEUTROABS 6.1  --   HGB 9.6* 8.4*  HCT 33.1* 29.3*  MCV 90.9 92.4  PLT 335 277   Basic Metabolic Panel: Recent Labs  Lab 01/28/24 1428 01/28/24 1825 01/29/24 0416  NA 137  --  135  K 4.5  --  4.2  CL 111  --  111  CO2 16*  --  13*  GLUCOSE 145*  --  138*  BUN 56*  --  54*  CREATININE 2.07* 2.07* 2.03*  CALCIUM 8.9  --  8.6*     Scheduled Meds:  [START ON 01/30/2024] B-complex with vitamin C  1 tablet Oral Daily   enoxaparin  (LOVENOX ) injection  30 mg Subcutaneous Q24H   furosemide   80 mg Oral Daily   insulin  aspart  0-15 Units Subcutaneous TID WC   losartan   100 mg Oral Daily   Ensure Max Protein  11 oz Oral Daily   simvastatin   40 mg Oral QPM   sodium bicarbonate   650 mg Oral BID   zinc  sulfate (50mg  elemental zinc )  220 mg Oral Daily    Imaging and lab data personally reviewed   Author: Kevork [name]  01/29/2024 5:33 PM  To contact [facility]>   Check the care team in [facility] and look for the attending/consulting [facility] provider listed  Log into [email or web address] and use [facility]'s universal password   Go to> [facility]  and find provider  If you still have difficulty reaching the provider, please page the [facility] (Director on Call) for the Hospitalists listed on amion

## 2024-01-29 NOTE — Plan of Care (Signed)
  Problem: Education: Goal: Ability to describe self-care measures that may prevent or decrease complications (Diabetes Survival Skills Education) will improve Outcome: Progressing   Problem: Coping: Goal: Ability to adjust to condition or change in health will improve Outcome: Progressing   Problem: Education: Goal: Ability to describe self-care measures that may prevent or decrease complications (Diabetes Survival Skills Education) will improve Outcome: Progressing   Problem: Coping: Goal: Ability to adjust to condition or change in health will improve Outcome: Progressing

## 2024-01-29 NOTE — Plan of Care (Signed)

## 2024-01-29 NOTE — Consult Note (Signed)
 Hospital Consult    Reason for Consult:  leg infection Referring Physician:  Dr. Earley MRN #:  [id number]  History of Present Illness: This is a 78 y.o. female here with right lower extremity wound.  She has been evaluated by orthopedics and considered for amputation.  Per the chart patient does have hyperlipidemia, hypertension, diabetes and chronic kidney disease has risk factors for vascular disease.  She is unable to answer any my questions and continuously repeated that the batteries and her hearing aids are not working.  The man in the room was searching for his wallet and similarly did not appear to be a good historian.  She was able to tell me that her legs hurt particularly her right leg and that she has swelling in the legs.  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   PT CAN TRANSFER BED TO CHAIR--CAN STAND BRIEFLY--BUT NOT ABLE TO WALK; RT LEG HYPEREXTENDS BACKWARD AND IS WEAK-HAS A POWER CHAIR   COLONIC POLYPS 07/11/2010   Constipation    Diabetes mellitus    ORAL MED FOR DIABETES   Gallbladder problem    GERD (gastroesophageal reflux disease)    PAST HISTORY GERD--WATCHES DIET - NOT ON ANY MEDS FOR GERD   HYPERLIPIDEMIA 11/29/2009   HYPERTENSION 11/29/2009   Incisional hernia    NO PAIN-NOT CAUSING ANY DISCOMFORT   Kidney infection    Osteoarthritis    Osteomyelitis (HCC)    PONV (postoperative nausea and vomiting)    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HCC)    11-28-14 remains   Shortness of breath    Stroke (cerebrum) (HCC)     Past Surgical History:  Procedure Laterality Date   1992     ABDOMINAL HYSTERECTOMY  1990   partial   ACHILLES TENDON LENGTHENING  1962   ADENOIDECTOMY  1950   APPENDECTOMY     CARPAL TUNNEL RELEASE  1983   CHOLECYSTECTOMY     COLONOSCOPY WITH PROPOFOL  N/A 12/07/2014   Procedure:  COLONOSCOPY WITH PROPOFOL ;  Surgeon: Lupita FORBES Commander, MD;  Location: [facility];  Service: Endoscopy;  Laterality: N/A;   LEFT CARPAL TUNNEL RELEASE X 2     LUMBAR SYMPATHETECTOMIES X 2     MULTIPLE RIGHT ANKLE SURGERIES     NEPHROLITHOTOMY  10/01/2011   Procedure: NEPHROLITHOTOMY PERCUTANEOUS;  Surgeon: Garnette Shack, MD;  Location: [facility];  Service: Urology;  Laterality: Right;        PARTIAL AMPUTATION OF LEFT GREAT TOE     1985   radical cystectomy  1992   neurogenic bladder sec CP   REPAIR OF NASAL FRACTURE  1985   REVISION UROSTOMY CUTANEOUS  1992   RIGHT CARPAL TUNNEL RELEASE     RIGHT HIP FLEXION CONTRACTURE REPAIR  1986   RT ANKLE FUSION  1975   SEVERAL RT ANKLE SURGERIES PRIOR TO THE FUSION   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--AT [facility]      TONSILLECTOMY     TUBAL LIGATION  1975   VAGINAL HYSTERECTOMY  1990   WISDOM TOOTH EXTRACTION  1971    Allergies  Allergen Reactions   Morphine Shortness Of Breath   Onglyza [Saxagliptin ] Other (See Comments)    Headache within 45 minutes of taking this.   Baclofen Other (See  Comments)    GI upset   Latex Other (See Comments)    Irritates the skin sometimes   Lisinopril Cough   Metoclopramide Hcl Other (See Comments)    Reglan = irritable   Penicillins Hives and Other (See Comments)     MAJOR CASE OF HIVES   Tape Other (See Comments)    Irritates the skin sometimes    Prior to Admission medications   Medication Sig Start Date End Date Taking? Authorizing Provider  acetaminophen  (TYLENOL ) 500 MG tablet Take 1,000 mg by mouth every evening. Pain   Yes [provider]  Dulaglutide  (TRULICITY ) 0.75 MG/0.5ML SOAJ Inject 0.75 mg into the skin every Wednesday.   Yes [provider]  furosemide  (LASIX ) 40 MG tablet Take 40 mg by mouth daily as needed for fluid or edema.   Yes [provider]  gabapentin  (NEURONTIN ) 100 MG capsule TAKE 1 TO 2 CAPSULES BY MOUTH AT NIGHT AS NEEDED FOR NERVE  PAIN Patient taking differently: Take 200 mg by mouth daily as needed (for nerve pain). 09/09/23  Yes Burchette, Wolm ORN, MD  losartan  (COZAAR ) 100 MG tablet TAKE 1 TABLET BY MOUTH DAILY Patient taking differently: Take 100 mg by mouth in the morning. 12/09/23  Yes Burchette, Wolm ORN, MD  Multiple Vitamin (MULTIVITAMIN) capsule Take 1 capsule by mouth 2 (two) times a week.   Yes [provider]  simvastatin  (ZOCOR ) 40 MG tablet TAKE 1 TABLET BY MOUTH EVERY EVENING 12/09/23  Yes Burchette, Wolm ORN, MD  Accu-Chek FastClix Lancets MISC Use to check blood sugar once daily. Dx Code E11.65 06/01/19   Micheal Wolm ORN, MD  Alcohol  Swabs  PADS Use as directed 05/31/20   Micheal Wolm ORN, MD  Blood Glucose Calibration (TRUE METRIX LEVEL 1) Low SOLN Use as directed 05/31/20   Burchette, Wolm ORN, MD  Blood Glucose Monitoring Suppl (ACCU-CHEK NANO SMARTVIEW) w/Device KIT Use to check blood glucose 2 times daily. 06/21/19   Burchette, Wolm ORN, MD  Blood Glucose Monitoring Suppl (TRUE METRIX METER) DEVI Use as directed 05/31/20   Burchette, Wolm ORN, MD  doxycycline  (VIBRA -TABS) 100 MG tablet Take 1 tablet (100 mg total) by mouth 2 (two) times daily. Patient not taking: Reported on 01/28/2024 01/09/24   Lue Elsie BROCKS, MD  glucose blood (ACCU-CHEK SMARTVIEW) test strip USE TO CHECK BLOOD GLUCOSE TWICE DAILY AS DIRECTED 05/31/18   Burchette, Wolm ORN, MD  Nystatin  (GERHARDT'S BUTT CREAM) CREA Apply 1 Application topically 2 (two) times daily. Patient not taking: Reported on 01/28/2024 01/09/24   Lue Elsie BROCKS, MD  nystatin  (MYCOSTATIN /NYSTOP ) powder Apply topically 3 (three) times daily. Patient not taking: Reported on 01/28/2024 01/09/24   Lue Elsie BROCKS, MD  TRUEplus Lancets 28G MISC Use as directed 05/31/20   Micheal Wolm ORN, MD    Social History   Socioeconomic History   Marital status: Married    Spouse name: Not on file   Number of children: 1   Years of education: Not on file    Highest education level: Master's degree (e.g., MA, MS, MEng, MEd, MSW, MBA)  Occupational History   Occupation: Counselor; Copywriter, advertising  Tobacco Use   Smoking status: Never   Smokeless tobacco: Never  Vaping Use   Vaping status: Never Used  Substance and Sexual Activity   Alcohol  use: No    Alcohol /week: 0.0 standard drinks of alcohol    Drug use: No   Sexual activity: Not on file  Other Topics Concern   Not on file  Social History Narrative   Married   Publishing copy and Journalist, newspaper - works part time at [employer]: Low Risk  (04/15/2022)   Overall Financial Resource Strain (CARDIA)    Difficulty of Paying Living Expenses: Not hard at all  Food Insecurity: No Food Insecurity (01/28/2024)   Hunger Vital Sign    Worried About Running Out of Food in the Last Year: Never true    Ran Out of Food in the Last Year: Never true  Transportation Needs: No Transportation Needs (01/28/2024)   PRAPARE - Administrator, Civil Service (Medical): No    Lack of Transportation (Non-Medical): No  Physical Activity: Insufficiently Active (04/15/2022)   Exercise Vital Sign    Days of Exercise per Week: 4 days    Minutes of Exercise per Session: 30 min  Stress: No Stress Concern Present (04/15/2022)   [employer] of Occupational Health - Occupational Stress Questionnaire    Feeling of Stress : Not at all  Social Connections: Socially Integrated (01/28/2024)   Social Connection and Isolation Panel    Frequency of Communication with Friends and Family: Three times a week    Frequency of Social Gatherings with Friends and Family: Twice a week    Attends Religious Services: More than 4 times per year    Active Member of [employer] or Organizations: Yes    Attends Engineer, structural: More than 4 times per year    Marital Status: Married  Catering manager Violence: Not At Risk (01/28/2024)   Humiliation, Afraid, Rape,  and Kick questionnaire    Fear of Current or Ex-Partner: No    Emotionally Abused: No    Physically Abused: No    Sexually Abused: No     Family History  Problem Relation Age of Onset   Heart disease Mother 31       COPD   Hypertension Mother    Hyperlipidemia Mother    Liver disease Mother    Alcoholism Mother    Heart disease Father        COPD    Emphysema Father    Colon cancer Father 73       Died at 74   Bladder Cancer Father    Diabetes Father    Hyperlipidemia Father    Cancer Father    Liver disease Father    Sleep apnea Father    Alcoholism Father    Colon polyps Sister    Heart disease Maternal Grandmother    Heart attack Maternal Grandfather    Esophageal cancer Neg Hx    Kidney disease Neg Hx     Review of Systems  Constitutional: Negative.   HENT: Negative.    Cardiovascular:  Positive for leg swelling.  Gastrointestinal: Negative.   Musculoskeletal:        Leg pain  Neurological: Negative.   Endo/Heme/Allergies: Negative.   Psychiatric/Behavioral: Negative.    All other systems reviewed and are negative.     Physical Examination  Vitals:   01/28/24 2120 01/29/24 0535  BP: (!) 142/52 (!) 140/57  Pulse: 69 60  Resp: 18 16  Temp: 98.1 F (36.7 C) 98.3 F (36.8 C)  SpO2: 100% 99%   Body mass index is 28.13 kg/m.  Physical Exam HENT:     Head: Normocephalic.     Mouth/Throat:     Mouth: Mucous membranes are moist.  Eyes:     Pupils: Pupils  are equal, round, and reactive to light.  Cardiovascular:     Comments: Bounding bilateral dorsalis pedis pulses Musculoskeletal:     Right lower leg: Edema present.     Left lower leg: Edema present.     Comments: Swelling of the right lower extremity appears that she has recent improvement with improved skin laxity  Skin:    Comments: I see very little skin breakdown on the right foot with little breakdown between the 1st and 2nd toe  Neurological:     Mental Status: She is alert.       CBC    Component Value Date/Time   WBC 8.9 01/28/2024 1825   RBC 3.17 (L) 01/28/2024 1825   HGB 8.4 (L) 01/28/2024 1825   HGB 11.2 01/26/2019 1545   HCT 29.3 (L) 01/28/2024 1825   HCT 36.2 01/26/2019 1545   PLT 277 01/28/2024 1825   MCV 92.4 01/28/2024 1825   MCV 87 01/26/2019 1545   MCH 26.5 01/28/2024 1825   MCHC 28.7 (L) 01/28/2024 1825   RDW 15.1 01/28/2024 1825   RDW 14.5 01/26/2019 1545   LYMPHSABS 2.0 01/28/2024 1428   LYMPHSABS 2.8 01/26/2019 1545   MONOABS 0.5 01/28/2024 1428   EOSABS 0.2 01/28/2024 1428   EOSABS 0.1 01/26/2019 1545   BASOSABS 0.1 01/28/2024 1428   BASOSABS 0.1 01/26/2019 1545    BMET    Component Value Date/Time   NA 135 01/29/2024 0416   K 4.2 01/29/2024 0416   CL 111 01/29/2024 0416   CO2 13 (L) 01/29/2024 0416   GLUCOSE 138 (H) 01/29/2024 0416   BUN 54 (H) 01/29/2024 0416   CREATININE 2.03 (H) 01/29/2024 0416   CALCIUM 8.6 (L) 01/29/2024 0416   GFRNONAA 25 (L) 01/29/2024 0416   GFRAA 43 (L) 01/08/2016 2138    COAGS: Lab Results  Component Value Date   INR 0.91 09/25/2011     Non-Invasive Vascular Imaging:   ABI Findings:  +---------+------------------+-----+----------+--------+  Right   Rt Pressure (mmHg)IndexWaveform  Comment   +---------+------------------+-----+----------+--------+  Brachial 161                    triphasic           +---------+------------------+-----+----------+--------+  PTA                            monophasic          +---------+------------------+-----+----------+--------+  DP                             biphasic            +---------+------------------+-----+----------+--------+  Great Toe123               0.76 Normal              +---------+------------------+-----+----------+--------+   +---------+------------------+-----+---------+-------+  Left    Lt Pressure (mmHg)IndexWaveform Comment  +---------+------------------+-----+---------+-------+   Brachial 155                    biphasic          +---------+------------------+-----+---------+-------+  PTA     143               0.89 biphasic          +---------+------------------+-----+---------+-------+  DP      147  0.91 triphasic         +---------+------------------+-----+---------+-------+  Great Toe108               0.67 Normal            +---------+------------------+-----+---------+-------+        Summary:  Right: The right toe-brachial index is normal.   Unable to obtain ABO due to open wounds.  Left: Resting left ankle-brachial is normal. The left toe-brachial index  is mildly abnormal.   ASSESSMENT/PLAN: This is a 78 y.o. female here for concern of right foot infection.  Appears the patient has baseline lymphedema her pulses are readily palpable.  I could not ascertain any history from the patient or her family member but currently appears that there is much is anything she needs local wound care and compressive dressings with elevation of her legs when she is in bed to prevent edema.  If there were evidence of underlying osteo she could be considered for amputation but at this time I do not think amputation is merited.  Vascular surgery will be available as needed.     Keilin Gamboa C. Sheree, MD [facility] Office: [phone] Pager: [phone]

## 2024-01-29 NOTE — Progress Notes (Signed)
 Initial Nutrition Assessment  DOCUMENTATION CODES:   Not applicable  INTERVENTION:  - Carb Modified diet.  - Ensure Max po daily, provides 150 kcal and 30 grams of protein.  - Diet education with handout provided for nutrition therapy for wound healing. - Add B Complex with vitamin C and 220mg  zinc  x14 days to support wound healing.    NUTRITION DIAGNOSIS:   Increased nutrient needs related to wound healing as evidenced by estimated needs.  GOAL:   Patient will meet greater than or equal to 90% of their needs  MONITOR:   PO intake, Supplement acceptance, Labs, Skin  REASON FOR ASSESSMENT:   Consult Wound healing  ASSESSMENT:   78 y.o. female with PMH of diabetes mellitus, CKD stage IV, cerebral palsy who was sent to hospital by her PCP for a wound on right foot and leg.  Patient known to this RD from recent admission last month.  She reports no significant changes in weight since last admission. Per EMR, patient weighed this admission at 185# and was weighed during last admission at 219#. However, the 219# was copied over from a weight taken in May, so unable to verify exact weight trends over the past few months.   Patient reports she was eating well after discharge with 3 meals a day and good appetite. No ONS at home.   Her current appetite is good and she is noted to have eaten more than 75% of her breakfast today.   Patient was provided with wound healing diet education and handout during last admission but provided these again today. Stressed importance of eating 3 meals a day with protein rich food sources. Also reviewed blood sugar control for adequate wound healing. Patient endorsed understanding.  She is agreeable to try ONS during admission as well as vitamin/mineral supplementation to support wound healing.   Of note, ortho saw patient yesterday and informed patient she is at a high risk for right BKA at this time. Patient reportedly adamantly opposed to this at  this time.    Medications reviewed and include: MVI  Labs reviewed:  Creatinine 2.03 HA1C 8.2 Blood Glucose 126-157 since admit yesterday   NUTRITION - FOCUSED PHYSICAL EXAM:  No depletions  Diet Order:   Diet Order             Diet Carb Modified Fluid consistency: Thin; Room service appropriate? Yes  Diet effective now                   EDUCATION NEEDS:  Education needs have been addressed  Skin:  Skin Assessment: Skin Integrity Issues: Skin Integrity Issues:: Diabetic Ulcer Diabetic Ulcer: Right foot/ankle  Last BM:  unknown  Height:  Ht Readings from Last 1 Encounters:  01/28/24 5' 8 (1.727 m)   Weight:  Wt Readings from Last 1 Encounters:  01/28/24 83.9 kg   Ideal Body Weight:  63.64 kg  BMI:  Body mass index is 28.13 kg/m.  Estimated Nutritional Needs:  Kcal:  1900-2100 kcals Protein:  85-100 grams Fluid:  >/= 1.9L    Trude Ned RD, LDN Contact via Secure Chat.

## 2024-01-30 ENCOUNTER — Inpatient Hospital Stay (HOSPITAL_COMMUNITY)

## 2024-01-30 DIAGNOSIS — L03116 Cellulitis of left lower limb: Secondary | ICD-10-CM | POA: Diagnosis not present

## 2024-01-30 LAB — BASIC METABOLIC PANEL WITH GFR
Anion gap: 11 (ref 5–15)
BUN: 41 mg/dL — ABNORMAL HIGH (ref 8–23)
CO2: 15 mmol/L — ABNORMAL LOW (ref 22–32)
Calcium: 8.4 mg/dL — ABNORMAL LOW (ref 8.9–10.3)
Chloride: 113 mmol/L — ABNORMAL HIGH (ref 98–111)
Creatinine, Ser: 2.09 mg/dL — ABNORMAL HIGH (ref 0.44–1.00)
GFR, Estimated: 24 mL/min — ABNORMAL LOW (ref >60)
Glucose, Bld: 124 mg/dL — ABNORMAL HIGH (ref 70–99)
Potassium: 4.2 mmol/L (ref 3.5–5.1)
Sodium: 139 mmol/L (ref 135–145)

## 2024-01-30 LAB — CBC
HCT: 30.3 % — ABNORMAL LOW (ref 36.0–46.0)
Hemoglobin: 8.6 g/dL — ABNORMAL LOW (ref 12.0–15.0)
MCH: 26 pg (ref 26.0–34.0)
MCHC: 28.4 g/dL — ABNORMAL LOW (ref 30.0–36.0)
MCV: 91.5 fL (ref 80.0–100.0)
Platelets: 266 K/uL (ref 150–400)
RBC: 3.31 MIL/uL — ABNORMAL LOW (ref 3.87–5.11)
RDW: 15.2 % (ref 11.5–15.5)
WBC: 7.5 K/uL (ref 4.0–10.5)
nRBC: 0 % (ref 0.0–0.2)

## 2024-01-30 LAB — GLUCOSE, CAPILLARY
Glucose-Capillary: 143 mg/dL — ABNORMAL HIGH (ref 70–99)
Glucose-Capillary: 235 mg/dL — ABNORMAL HIGH (ref 70–99)
Glucose-Capillary: 101 mg/dL — ABNORMAL HIGH (ref 70–99)
Glucose-Capillary: 206 mg/dL — ABNORMAL HIGH (ref 70–99)

## 2024-01-30 LAB — LACTIC ACID, PLASMA: Lactic Acid, Venous: 0.6 mmol/L (ref 0.5–1.9)

## 2024-01-30 MED ORDER — MIDAZOLAM HCL 2 MG/2ML IJ SOLN
2.0000 mg | INTRAMUSCULAR | Status: AC
  Administered 2024-01-30: 2 mg via INTRAVENOUS

## 2024-01-30 NOTE — Plan of Care (Signed)

## 2024-01-30 NOTE — Progress Notes (Addendum)
 [facility] Progress Note  Patient: Jordan [name]     FMW:[id number]  DOA: 01/28/2024   PCP: Micheal Wolm ORN, MD       Brief hospital course: HPI: Jordan [name] is a 78 y.o. female with medical history of diabetes mellitus, chronic kidney disease stage IV, cerebral palsy is sent to the hospital by her PCP for a wound on right foot and leg.  The patient states that was wrapped at the PCP office and she was recommended to continue the wrapping for 10 days without opening.  It was unwrapped today and she was found to have a wound with maggots for which she was sent to the ED.  She does not complain of any fevers.  She has pain mainly in the dorsum of the right foot.  She has no other complaints.  She states that she completed her antibiotics from her prior hospital stay and has been taking her medications appropriately.  ED Course:  X-ray of the right foot reveals extensive soft tissue swelling over the dorsum of the forefoot and midfoot.  No focal soft tissue defect or air within the soft tissue  ceftriaxone , metronidazole  and vancomycin  ordered.  Subjective:  No complaints. Concerned about long term  Assessment and Plan: Principal Problem:   Cellulitis of left lower extremity - Allergic to penicillins-started on ceftriaxone , vancomycin  and metronidazole  - Extensive cellulitis with ulceration and maggots-no fever or leukocytosis -She had a hospital admission from 7/23 through 7/26 for an ulcer on her ankle and was discharged home with doxycycline  which she states she took appropriately until course was completed - Orthopedic surgery consult requested-recommendations are for amputation and according to the note from Dr Barton, the patient stated that an amputation as an option is a slap in the face and she has no intention of agreeing to it today.   -Vascular surgery recommends ace wraps - initially declined MRIs of the foot and ankle and leg but has agreed today- no  osteomyelitis or abscess noted  Active Problems:  Dementia?  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     CKD (chronic kidney disease) stage 4, GFR 15-29 ml/min (HCC) -Creatinine appears to be at baseline which ranges from 1.9-2.2   Chronic metabolic acidosis - cont bicarbonate tabs-650 mg twice daily -  lactic acid level normal   History of cerebral palsy and CVA -Has motorized wheelchair and chronic RLE and RUE weakness      Code Status: Full Code Total time on patient care: 40 minutes DVT prophylaxis:  enoxaparin  (LOVENOX ) injection 30 mg Start: 01/28/24 2200     Objective:   Vitals:   01/29/24 2011 01/30/24 0551 01/30/24 1223 01/30/24 1948  BP: (!) 157/65 (!) 144/53 (!) 127/48 (!) 154/53  Pulse: 66 66 68 74  Resp: 16 18 16 16   Temp: 98.8 F (37.1 C) 98 F (36.7 C) 97.7 F (36.5 C) 98.8 F (37.1 C)  TempSrc: Oral Oral Oral   SpO2: 100% 99% 97% 98%  Weight:      Height:       Filed Weights   01/28/24 1327  Weight: 83.9 kg   Exam: General exam:  Appears comfortable  HEENT: oral mucosa moist Respiratory system: Clear to auscultation.  Cardiovascular system: S1 & S2 heard  Gastrointestinal system: Abdomen soft, non-tender, nondistended. Normal bowel sounds   Extremities: Extensive erythema, edema and scabs on right leg and foot Psychiatry:  Mood & affect appropriate.      CBC: Recent Labs  Lab 01/28/24 1428 01/28/24 1825 01/30/24 0424  WBC 8.9 8.9 7.5  NEUTROABS 6.1  --   --   HGB 9.6* 8.4* 8.6*  HCT 33.1* 29.3* 30.3*  MCV 90.9 92.4 91.5  PLT 335 277 266   Basic Metabolic Panel: Recent Labs  Lab 01/28/24 1428 01/28/24 1825  01/29/24 0416 01/30/24 0424  NA 137  --  135 139  K 4.5  --  4.2 4.2  CL 111  --  111 113*  CO2 16*  --  13* 15*  GLUCOSE 145*  --  138* 124*  BUN 56*  --  54* 41*  CREATININE 2.07* 2.07* 2.03* 2.09*  CALCIUM 8.9  --  8.6* 8.4*     Scheduled Meds:  B-complex with vitamin C  1 tablet Oral Daily   enoxaparin  (LOVENOX ) injection  30 mg Subcutaneous Q24H   furosemide   80 mg Oral Daily   insulin  aspart  0-15 Units Subcutaneous TID WC   losartan   100 mg Oral Daily   Ensure Max Protein  11 oz Oral Daily   simvastatin   40 mg Oral QPM   sodium bicarbonate   650 mg Oral BID   zinc  sulfate (50mg  elemental zinc )  220 mg Oral Daily    Imaging and lab data personally reviewed   Author: [employer]  01/30/2024 8:11 PM  To contact [facility]>   Check the care team in [facility] and look for the attending/consulting [facility] provider listed  Log into [email or web address] and use [place]'s universal password   Go to> [facility]  and find provider  If you still have difficulty reaching the provider, please page the [facility] (Director on Call) for the Hospitalists listed on amion

## 2024-01-30 NOTE — TOC Initial Note (Signed)
 Transition of Care [facility]) - Initial/Assessment Note    Patient Details  Name: Jordan [name] MRN: [id number] Date of Birth: Apr 22, 1946  Transition of Care [facility]) CM/SW Contact:    Sheri ONEIDA Sharps, LCSW Phone Number: 01/30/2024, 9:50 AM  Clinical Narrative:                 Pt from home with spouse. Pt continues medical workup. TOC following for dc needs.     Barriers to Discharge: Continued Medical Work up   Patient Goals and CMS Choice Patient states their goals for this hospitalization and ongoing recovery are:: return home   Choice offered to / list presented to : NA      Expected Discharge Plan and Services In-house Referral: NA Discharge Planning Services: NA   Living arrangements for the past 2 months: Single Family Home                 DME Arranged: N/A DME Agency: NA       HH Arranged: NA HH Agency: NA        Prior Living Arrangements/Services Living arrangements for the past 2 months: Single Family Home Lives with:: Spouse Patient language and need for interpreter reviewed:: Yes        Need for Family Participation in Patient Care: Yes (Comment) Care giver support system in place?: Yes (comment)   Criminal Activity/Legal Involvement Pertinent to Current Situation/Hospitalization: No - Comment as needed  Activities of Daily Living   ADL Screening (condition at time of admission) Independently performs ADLs?: No Does the patient have a NEW difficulty with bathing/dressing/toileting/self-feeding that is expected to last >3 days?: No Does the patient have a NEW difficulty with getting in/out of bed, walking, or climbing stairs that is expected to last >3 days?: No Does the patient have a NEW difficulty with communication that is expected to last >3 days?: No Is the patient deaf or have difficulty hearing?: Yes Does the patient have difficulty seeing, even when wearing glasses/contacts?: No Does the patient have difficulty concentrating, remembering, or  making decisions?: No  Permission Sought/Granted                  Emotional Assessment Appearance:: Appears stated age Attitude/Demeanor/Rapport: Engaged Affect (typically observed): Accepting Orientation: : Oriented to Situation, Oriented to Place, Oriented to Self Alcohol  / Substance Use: Not Applicable Psych Involvement: No (comment)  Admission diagnosis:  Cellulitis of right foot [L03.115] Cellulitis of left lower extremity [L03.116] Patient Active Problem List   Diagnosis Date Noted   Cellulitis of right foot 01/28/2024   Cellulitis of left lower extremity 01/28/2024   Diabetic ulcer of ankle (HCC) 01/07/2024   Normocytic anemia 01/07/2024   Mild protein malnutrition (HCC) 01/07/2024   Erythema intertrigo 01/07/2024   Stage 2 skin ulcer of sacral region (HCC) 01/07/2024   CKD (chronic kidney disease) stage 4, GFR 15-29 ml/min (HCC) 01/21/2023   Skin inflammation at urostomy site [facility]) 12/22/2022   Non-recurrent bilateral inguinal hernia without obstruction or gangrene 07/15/2022   Irritant contact dermatitis associated with urinary stoma 07/02/2022   Complication of urostomy (HCC) 07/02/2022   Obesity (BMI 30.0-34.9) 04/26/2019   Venous stasis of both lower extremities 04/26/2019   Leg wound, right, subsequent encounter 12/01/2016   Hx of colonic polyps    Type 2 diabetes mellitus with diabetic chronic kidney disease (HCC) 08/04/2014   Pyelonephritis 07/08/2014   History of kidney stones 06/12/2014   Hypercalcemia 06/12/2014   Sepsis secondary to UTI (HCC) 12/03/2011  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   Type 2 diabetes mellitus (HCC) 10/09/2010   Infantile cerebral palsy (HCC) 07/11/2010   Hyperlipidemia 11/29/2009   Essential hypertension 11/29/2009   PCP:  Micheal Wolm ORN, MD Pharmacy:   [facility] [id number] - [place], [place] - [address] AT [facility] [address] [facility] [address] [place] [address] Phone:  [phone] Fax: [phone]     Social Drivers of Health (SDOH) Social History: SDOH Screenings   Food Insecurity: No Food Insecurity (01/28/2024)  Housing: Low Risk  (01/28/2024)  Transportation Needs: No Transportation Needs (01/28/2024)  Utilities: Not At Risk (01/28/2024)  Alcohol  Screen: Low Risk  (02/28/2020)  Depression (PHQ2-9): Low Risk  (01/15/2024)  Financial Resource Strain: Low Risk  (04/15/2022)  Physical Activity: Insufficiently Active (04/15/2022)  Social Connections: Socially Integrated (01/28/2024)  Stress: No Stress Concern Present (04/15/2022)  Tobacco Use: Low Risk  (01/28/2024)   SDOH Interventions:     Readmission Risk Interventions    01/30/2024    9:48 AM 01/07/2024    2:02 PM  Readmission Risk Prevention Plan  Transportation Screening Complete Complete  PCP or Specialist Appt within 5-7 Days  Complete  PCP or Specialist Appt within 3-5 Days Complete   Home Care Screening  Complete  Medication Review (RN CM)  Complete  HRI or Home Care Consult Complete   Social Work Consult for Recovery Care Planning/Counseling Complete   Palliative Care Screening Not Applicable   Medication Review Oceanographer) Complete

## 2024-01-30 NOTE — Plan of Care (Signed)
 Wound care instructions, education, and demonstration provided to pt and pt spouse [name], [name]).  Pt spouse to do wound care return demonstration on next dressing change with RN

## 2024-01-31 DIAGNOSIS — L03116 Cellulitis of left lower limb: Secondary | ICD-10-CM | POA: Diagnosis not present

## 2024-01-31 LAB — BASIC METABOLIC PANEL WITH GFR
Anion gap: 7 (ref 5–15)
BUN: 40 mg/dL — ABNORMAL HIGH (ref 8–23)
CO2: 19 mmol/L — ABNORMAL LOW (ref 22–32)
Calcium: 8.2 mg/dL — ABNORMAL LOW (ref 8.9–10.3)
Chloride: 112 mmol/L — ABNORMAL HIGH (ref 98–111)
Creatinine, Ser: 2.1 mg/dL — ABNORMAL HIGH (ref 0.44–1.00)
GFR, Estimated: 24 mL/min — ABNORMAL LOW (ref >60)
Glucose, Bld: 175 mg/dL — ABNORMAL HIGH (ref 70–99)
Potassium: 3.4 mmol/L — ABNORMAL LOW (ref 3.5–5.1)
Sodium: 138 mmol/L (ref 135–145)

## 2024-01-31 LAB — GLUCOSE, CAPILLARY
Glucose-Capillary: 146 mg/dL — ABNORMAL HIGH (ref 70–99)
Glucose-Capillary: 250 mg/dL — ABNORMAL HIGH (ref 70–99)
Glucose-Capillary: 253 mg/dL — ABNORMAL HIGH (ref 70–99)
Glucose-Capillary: 172 mg/dL — ABNORMAL HIGH (ref 70–99)

## 2024-01-31 MED ORDER — INSULIN GLARGINE-YFGN 100 UNIT/ML ~~LOC~~ SOLN
10.0000 [IU] | Freq: Every day | SUBCUTANEOUS | Status: DC
  Administered 2024-01-31 – 2024-02-01 (×2): 10 [IU] via SUBCUTANEOUS
  Filled 2024-01-31 (×3): qty 0.1

## 2024-01-31 MED ORDER — POTASSIUM CHLORIDE CRYS ER 20 MEQ PO TBCR
40.0000 meq | EXTENDED_RELEASE_TABLET | ORAL | Status: AC
  Administered 2024-01-31 (×2): 40 meq via ORAL
  Filled 2024-01-31 (×2): qty 2

## 2024-01-31 NOTE — Evaluation (Signed)
 Physical Therapy Evaluation Patient Details Name: Jordan [name] MRN: [id number] DOB: 06-25-1945 Today's Date: 01/31/2024  History of Present Illness  Pt is a 78 y.o. female sent to the hospital by her PCP for a wound on right foot and leg.  The patient states that was wrapped at the PCP office and she was recommended to continue the wrapping for 10 days without opening.  It was unwrapped today and she was found to have a wound with maggots for which she was sent to the ED. 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  Pt currently with functional limitations due to the deficits listed below (see PT Problem List). Pt will benefit from acute skilled PT to increase their independence and safety with mobility to allow discharge.  Pt reports she typically is independent from her power w/c and usually able to transfer to/from her w/c without assist.   Pt currently requiring at least mod assist for bed mobility and standing today (+2 for safety).  Pt's spouse reports concern of d/c home today stating he cannot physically assist her or help if she falls (elderly gentlemen with tremoring UEs observed session).  Patient will benefit from continued inpatient follow up therapy, <3 hours/day.          If plan is discharge home, recommend the following: A lot of help with walking and/or transfers;A lot of help with bathing/dressing/bathroom;Assistance with cooking/housework;Assist for transportation   Can travel by private vehicle        Equipment Recommendations None recommended by PT  Recommendations for Other Services       Functional Status Assessment Patient has had a recent decline in their functional status and demonstrates the ability to make significant improvements in  function in a reasonable and predictable amount of time.     Precautions / Restrictions Precautions Precautions: Fall Precaution/Restrictions Comments: hx CP and CVA with R residual deficits      Mobility  Bed Mobility Overal bed mobility: Needs Assistance Bed Mobility: Supine to Sit, Sit to Supine     Supine to sit: Mod assist Sit to supine: Mod assist   General bed mobility comments: required assist to scoot to EOB, assist for LEs onto bed    Transfers Overall transfer level: Needs assistance Equipment used: 2 person hand held assist Transfers: Sit to/from Stand Sit to Stand: Mod assist, +2 physical assistance           General transfer comment: verbal cues for safe technique, pt denies using assistive device for transfer at home, upon initial rise pt was unable to stand fully erect, attempted again and pt reports feeling off balance; requiring mod assist for weakness and stability    Ambulation/Gait                  Stairs            Wheelchair Mobility     Tilt Bed    Modified Rankin (Stroke Patients Only)       Balance Overall balance assessment: Needs assistance         Standing balance support: Bilateral upper extremity supported, During functional activity Standing balance-Leahy Scale: Poor  Pertinent Vitals/Pain Pain Assessment Pain Assessment: Faces Faces Pain Scale: Hurts even more Pain Location: R LE with movement/assist Pain Descriptors / Indicators: Sore, Discomfort, Guarding, Grimacing Pain Intervention(s): Repositioned, Monitored during session    Home Living Family/patient expects to be discharged to:: Private residence Living Arrangements: Spouse/significant other   Type of Home: House Home Access: Level entry       Home Layout: One level Home Equipment: Wheelchair - power      Prior Function Prior Level of Function : Independent/Modified Independent              Mobility Comments: reports she is able to stand pivot to power w/c independently ADLs Comments: performs everything from power w/c per her report     Extremity/Trunk Assessment   Upper Extremity Assessment Upper Extremity Assessment: RUE deficits/detail    Lower Extremity Assessment Lower Extremity Assessment: RLE deficits/detail;Generalized weakness RLE Deficits / Details: R sided residual deficits (hx of CVA and CP); right lower leg also in ace wrapping       Communication   Communication Communication: Impaired Factors Affecting Communication: Hearing impaired    Cognition Arousal: Alert Behavior During Therapy: WFL for tasks assessed/performed   PT - Cognitive impairments: Safety/Judgement, Problem solving                         Following commands: Intact       Cueing       General Comments      Exercises     Assessment/Plan    PT Assessment Patient needs continued PT services  PT Problem List Decreased strength;Decreased activity tolerance;Decreased mobility;Decreased knowledge of use of DME;Obesity;Decreased balance       PT Treatment Interventions DME instruction;Balance training;Functional mobility training;Patient/family education;Wheelchair mobility training;Therapeutic activities;Therapeutic exercise    PT Goals (Current goals can be found in the Care Plan section)  Acute Rehab PT Goals PT Goal Formulation: With patient/family Time For Goal Achievement: 02/14/24 Potential to Achieve Goals: Fair    Frequency Min 3X/week     Co-evaluation               AM-PAC PT 6 Clicks Mobility  Outcome Measure Help needed turning from your back to your side while in a flat bed without using bedrails?: A Lot Help needed moving from lying on your back to sitting on the side of a flat bed without using bedrails?: A Lot Help needed moving to and from a bed to a chair (including a wheelchair)?: A Lot Help needed standing up from a chair  using your arms (e.g., wheelchair or bedside chair)?: A Lot Help needed to walk in hospital room?: Total Help needed climbing 3-5 steps with a railing? : Total 6 Click Score: 10    End of Session Equipment Utilized During Treatment: Gait belt Activity Tolerance: Patient tolerated treatment well Patient left: in bed;with call bell/phone within reach;with family/visitor present;with bed alarm set Nurse Communication: Mobility status PT Visit Diagnosis: Unsteadiness on feet (R26.81);Muscle weakness (generalized) (M62.81)    Time: 8667-8646 PT Time Calculation (min) (ACUTE ONLY): 21 min   Charges:   PT Evaluation $PT Eval Low Complexity: 1 Low   PT General Charges $$ ACUTE PT VISIT: 1 Visit       Tari PT, DPT Physical Therapist Acute Rehabilitation Services Office: [phone]   Tari CROME Payson 01/31/2024, 2:54 PM

## 2024-01-31 NOTE — Progress Notes (Signed)
 [facility] Progress Note  Patient: Jordan [name]     FMW:[id number]  DOA: 01/28/2024   PCP: Jordan [name] ORN, MD       Brief hospital course: HPI: Jordan [name] is a 78 y.o. female with medical history of diabetes mellitus, chronic kidney disease stage IV, cerebral palsy is sent to the hospital by her PCP for a wound on right foot and leg.  The patient states that was wrapped at the PCP office and she was recommended to continue the wrapping for 10 days without opening.  It was unwrapped today and she was found to have a wound with maggots for which she was sent to the ED.  She does not complain of any fevers.  She has pain mainly in the dorsum of the right foot.  She has no other complaints.  She states that she completed her antibiotics from her prior hospital stay and has been taking her medications appropriately.  ED Course:  X-ray of the right foot reveals extensive soft tissue swelling over the dorsum of the forefoot and midfoot.  No focal soft tissue defect or air within the soft tissue  ceftriaxone , metronidazole  and vancomycin  ordered.  Subjective:  No complaints.   Assessment and Plan: Principal Problem: RLE lymphedema - initially felt to have cellulitis-started on ceftriaxone , vancomycin  and metronidazole   - She had a hospital admission from 7/23 through 7/26 for an ulcer on her ankle and was discharged home with doxycycline  which she states she took appropriately until course was completed - Orthopedic surgery consult requested-recommendations are for amputation and according to the note from Dr Barton, the patient stated that an amputation as an option is a slap in the face and she has no intention of agreeing to it today.   -Vascular surgery recommends ace wraps - initially declined MRIs of the foot and ankle and leg but has agreed on 8/16- no osteomyelitis or abscess noted  Active Problems:  Dementia?  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     CKD (chronic kidney disease) stage 4, GFR 15-29 ml/min (HCC) -Creatinine appears to be at baseline which ranges from 1.9-2.2   Chronic metabolic acidosis - cont bicarbonate tabs-650 mg twice daily -  lactic acid level normal   History of cerebral palsy and CVA -Has motorized wheelchair and chronic RLE and RUE weakness      Code Status: Full Code Total time on patient care: 40 minutes DVT prophylaxis:  enoxaparin  (LOVENOX ) injection 30 mg Start: 01/28/24 2200     Objective:   Vitals:   01/30/24 1948 01/31/24 0417 01/31/24 1137 01/31/24 1226  BP: (!) 154/53 (!) 124/53 (!) 128/57 (!) 133/45  Pulse: 74 65 80 76  Resp: 16 16 16 20   Temp: 98.8 F (37.1 C) 98.3 F (36.8 C) 98 F (36.7 C) 97.7 F (36.5 C)  TempSrc: Oral   Oral  SpO2: 98% 97% 98% 98%  Weight:      Height:       Filed Weights   01/28/24 1327  Weight: 83.9 kg   Exam: General exam: Appears comfortable  HEENT: oral mucosa moist Respiratory system: Clear to auscultation.  Cardiovascular system: S1 & S2 heard  Gastrointestinal system: Abdomen soft, non-tender, nondistended. Normal bowel sounds   Extremities: right leg in dressing Psychiatry:  Mood & affect appropriate.     CBC: Recent Labs  Lab 01/28/24 1428 01/28/24 1825 01/30/24 0424  WBC 8.9 8.9 7.5  NEUTROABS 6.1  --   --   HGB 9.6* 8.4* 8.6*  HCT 33.1* 29.3* 30.3*  MCV 90.9 92.4 91.5  PLT 335 277 266   Basic Metabolic Panel: Recent Labs  Lab 01/28/24 1428 01/28/24 1825 01/29/24 0416 01/30/24 0424 01/31/24 0356  NA 137  --  135 139 138  K 4.5  --  4.2 4.2 3.4*  CL 111  --  111  113* 112*  CO2 16*  --  13* 15* 19*  GLUCOSE 145*  --  138* 124* 175*  BUN 56*  --  54* 41* 40*  CREATININE 2.07* 2.07* 2.03* 2.09* 2.10*  CALCIUM 8.9  --  8.6* 8.4* 8.2*     Scheduled Meds:  B-complex with vitamin C  1 tablet Oral Daily   enoxaparin  (LOVENOX ) injection  30 mg Subcutaneous Q24H   furosemide   80 mg Oral Daily   insulin  aspart  0-15 Units Subcutaneous TID WC   losartan   100 mg Oral Daily   Ensure Max Protein  11 oz Oral Daily   simvastatin   40 mg Oral QPM   sodium bicarbonate   650 mg Oral BID   zinc  sulfate (50mg  elemental zinc )  220 mg Oral Daily    Imaging and lab data personally reviewed   Author: Winefred Hillesheim  01/31/2024 4:35 PM  To contact [facility]>   Check the care team in [facility] and look for the attending/consulting TRH provider listed  Log into [email or web address] and use [place]'s universal password   Go to> [facility]  and find provider  If you still have difficulty reaching the provider, please page the [facility] (Director on Call) for the Hospitalists listed on amion

## 2024-01-31 NOTE — Plan of Care (Signed)

## 2024-01-31 NOTE — Care Plan (Signed)
 Orthopaedic Surgery Plan of Care Note   -MRI imaging reviewed -pt has no osteomyelitis or drainable abscess -advise lymphedema management both inpatient and outpatient as well as wound care given recent maggot infestation of superficial areas of desquamation -Ortho will sign off, no follow up needed   Lillia Mountain, MD Orthopaedic Surgery [facility]

## 2024-02-01 ENCOUNTER — Ambulatory Visit: Admitting: Family Medicine

## 2024-02-01 DIAGNOSIS — L03116 Cellulitis of left lower limb: Secondary | ICD-10-CM | POA: Diagnosis not present

## 2024-02-01 LAB — BASIC METABOLIC PANEL WITH GFR
Anion gap: 10 (ref 5–15)
BUN: 36 mg/dL — ABNORMAL HIGH (ref 8–23)
CO2: 19 mmol/L — ABNORMAL LOW (ref 22–32)
Calcium: 8.5 mg/dL — ABNORMAL LOW (ref 8.9–10.3)
Chloride: 112 mmol/L — ABNORMAL HIGH (ref 98–111)
Creatinine, Ser: 1.91 mg/dL — ABNORMAL HIGH (ref 0.44–1.00)
GFR, Estimated: 26 mL/min — ABNORMAL LOW (ref >60)
Glucose, Bld: 155 mg/dL — ABNORMAL HIGH (ref 70–99)
Potassium: 3.9 mmol/L (ref 3.5–5.1)
Sodium: 141 mmol/L (ref 135–145)

## 2024-02-01 LAB — GLUCOSE, CAPILLARY
Glucose-Capillary: 141 mg/dL — ABNORMAL HIGH (ref 70–99)
Glucose-Capillary: 140 mg/dL — ABNORMAL HIGH (ref 70–99)
Glucose-Capillary: 155 mg/dL — ABNORMAL HIGH (ref 70–99)
Glucose-Capillary: 143 mg/dL — ABNORMAL HIGH (ref 70–99)

## 2024-02-01 NOTE — Progress Notes (Signed)
 Pt called for assistance to transfer back to bed from bedside chair. She has been sitting in chair since about noon per mobility note. Pt was unable to stand with 2 assist or bear any weight on BLE. After many attempts to stand and noted fatigue, 3 staff members were required to transfer pt back to bed. Pt was unable to bear weight to stand and pivot.  Pt states she previously declined SNF but is willing to go for rehab upon discharge but only for a week at the most.

## 2024-02-01 NOTE — TOC Progression Note (Signed)
 Transition of Care [facility]) - Progression Note    Patient Details  Name: Jordan [name] MRN: [id number] Date of Birth: 1945-09-27  Transition of Care [facility]) CM/SW Contact  Sheri ONEIDA Sharps, [place] Phone Number: 02/01/2024, 9:53 PM  Clinical Narrative:    Pt recommended for SNF. PASRR created and FL2 completed. SNF referrals faxed; awaiting bed offers.     Barriers to Discharge: Continued Medical Work up               Expected Discharge Plan and Services In-house Referral: NA Discharge Planning Services: NA   Living arrangements for the past 2 months: Single Family Home                 DME Arranged: N/A DME Agency: NA       HH Arranged: NA HH Agency: NA         Social Drivers of Health (SDOH) Interventions SDOH Screenings   Food Insecurity: No Food Insecurity (01/28/2024)  Housing: Low Risk  (01/28/2024)  Transportation Needs: No Transportation Needs (01/28/2024)  Utilities: Not At Risk (01/28/2024)  Alcohol  Screen: Low Risk  (02/28/2020)  Depression (PHQ2-9): Low Risk  (01/15/2024)  Financial Resource Strain: Low Risk  (04/15/2022)  Physical Activity: Insufficiently Active (04/15/2022)  Social Connections: Socially Integrated (01/28/2024)  Stress: No Stress Concern Present (04/15/2022)  Tobacco Use: Low Risk  (01/28/2024)    Readmission Risk Interventions    01/30/2024    9:48 AM 01/07/2024    2:02 PM  Readmission Risk Prevention Plan  Transportation Screening Complete Complete  PCP or Specialist Appt within 5-7 Days  Complete  PCP or Specialist Appt within 3-5 Days Complete   Home Care Screening  Complete  Medication Review (RN CM)  Complete  HRI or Home Care Consult Complete   Social Work Consult for Recovery Care Planning/Counseling Complete   Palliative Care Screening Not Applicable   Medication Review Oceanographer) Complete

## 2024-02-01 NOTE — Progress Notes (Addendum)
 [facility] Progress Note  Patient: Jordan [name]     FMW:[id number]  DOA: 01/28/2024   PCP: Micheal Wolm ORN, MD       Brief hospital course: HPI: Jordan [name] is a 78 y.o. female with medical history of diabetes mellitus, chronic kidney disease stage IV, cerebral palsy is sent to the hospital by her PCP for a wound on right foot and leg.  The patient states that was wrapped at the PCP office and she was recommended to continue the wrapping for 10 days without opening.  It was unwrapped today and she was found to have a wound with maggots for which she was sent to the ED.  She does not complain of any fevers.  She has pain mainly in the dorsum of the right foot.  She has no other complaints.  She states that she completed her antibiotics from her prior hospital stay and has been taking her medications appropriately.  ED Course:  X-ray of the right foot reveals extensive soft tissue swelling over the dorsum of the forefoot and midfoot.  No focal soft tissue defect or air within the soft tissue  ceftriaxone , metronidazole  and vancomycin  ordered. The patient is medically ready for discharge. Awaiting SNF placement.   Subjective:  No complaints  Assessment and Plan: Principal Problem: RLE lymphedema - initially felt to have cellulitis-started on ceftriaxone , vancomycin  and metronidazole   - She had a hospital admission from 7/23 through 7/26 for an ulcer on her ankle and was discharged home with doxycycline  which she states she took appropriately until course was completed - Orthopedic surgery consult requested-recommendations are for amputation and according to the note from Dr Barton, the patient stated that an amputation as an option is a slap in the face and she has no intention of agreeing to it today.   -Vascular surgery recommends ace wraps - initially declined MRIs of the foot and ankle and leg but has agreed on 8/16- no osteomyelitis or abscess noted - patient and  husband going back and forth regarding going home and doing dressing changed vs going to SNF as recommended by PT - TOC consulted for SNF on 8/17  Active Problems:  Dementia?  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     CKD (chronic kidney disease) stage 4, GFR 15-29 ml/min (HCC) -Creatinine appears to be at baseline which ranges from 1.9-2.2   Chronic metabolic acidosis - cont bicarbonate tabs-650 mg twice daily -  lactic acid level normal   History of cerebral palsy and CVA -Has motorized wheelchair and chronic RLE and RUE weakness      Code Status: Full Code Total time on patient care: 35 minutes DVT prophylaxis:  enoxaparin  (LOVENOX ) injection 30 mg Start: 01/28/24 2200     Objective:   Vitals:   01/31/24 2047 02/01/24 0529 02/01/24 0946 02/01/24 1341  BP: (!) 142/53 (!) 128/49 (!) 149/60 (!) 134/54  Pulse: 76 69 79 66  Resp: 18 16  16   Temp: 98.7 F (37.1 C) 98.1 F (36.7 C)  98 F (36.7 C)  TempSrc:  SpO2: 97% 98% 100% 99%  Weight:      Height:       Filed Weights   01/28/24 1327  Weight: 83.9 kg   Exam: General exam: Appears comfortable  HEENT: oral mucosa moist Respiratory system: Clear to auscultation.  Cardiovascular system: S1 & S2 heard  Gastrointestinal system: Abdomen soft, non-tender, nondistended. Normal bowel sounds   Extremities: right leg in dressing Psychiatry:  Mood & affect appropriate.     CBC: Recent Labs  Lab 01/28/24 1428 01/28/24 1825 01/30/24 0424  WBC 8.9 8.9 7.5  NEUTROABS 6.1  --   --   HGB 9.6* 8.4* 8.6*  HCT 33.1*  29.3* 30.3*  MCV 90.9 92.4 91.5  PLT 335 277 266   Basic Metabolic Panel: Recent Labs  Lab 01/28/24 1428 01/28/24 1825 01/29/24 0416 01/30/24 0424 01/31/24 0356 02/01/24 0407  NA 137  --  135 139 138 141  K 4.5  --  4.2 4.2 3.4* 3.9  CL 111  --  111 113* 112* 112*  CO2 16*  --  13* 15* 19* 19*  GLUCOSE 145*  --  138* 124* 175* 155*  BUN 56*  --  54* 41* 40* 36*  CREATININE 2.07* 2.07* 2.03* 2.09* 2.10* 1.91*  CALCIUM 8.9  --  8.6* 8.4* 8.2* 8.5*     Scheduled Meds:  B-complex with vitamin C  1 tablet Oral Daily   enoxaparin  (LOVENOX ) injection  30 mg Subcutaneous Q24H   furosemide   80 mg Oral Daily   insulin  aspart  0-15 Units Subcutaneous TID WC   insulin  glargine-yfgn  10 Units Subcutaneous QHS   losartan   100 mg Oral Daily   Ensure Max Protein  11 oz Oral Daily   simvastatin   40 mg Oral QPM   sodium bicarbonate   650 mg Oral BID   zinc  sulfate (50mg  elemental zinc )  220 mg Oral Daily    Imaging and lab data personally reviewed   Author: Sanda Dejoy  02/01/2024 7:32 PM  To contact [facility]>   Check the care team in [facility] and look for the attending/consulting [facility] provider listed  Log into [email or web address] and use [place]'s universal password   Go to> [facility]  and find provider  If you still have difficulty reaching the provider, please page the [facility] (Director on Call) for the Hospitalists listed on amion

## 2024-02-01 NOTE — [place] FL2 (Signed)
   MEDICAID FL2 LEVEL OF CARE FORM     IDENTIFICATION  Patient Name: Jordan [name] Birthdate: 03-22-1946 Sex: female Admission Date (Current Location): 01/28/2024  [facility] and [place] Number:  Producer, television/film/video and Address:  [facility],  [address] [place], [place] [address]      Provider Number: [id number]  Attending Physician Name and Address:  Earley Saucer, MD  Relative Name and Phone Number:  Francesa, Eugenio (Spouse)  [phone]    Current Level of Care: Hospital Recommended Level of Care: Skilled Nursing Facility Prior Approval Number:    Date Approved/Denied:   PASRR Number: [id number]  Discharge Plan: SNF    Current Diagnoses: Patient Active Problem List   Diagnosis Date Noted   Cellulitis of right foot 01/28/2024   Cellulitis of left lower extremity 01/28/2024   Diabetic ulcer of ankle (HCC) 01/07/2024   Normocytic anemia 01/07/2024   Mild protein malnutrition (HCC) 01/07/2024   Erythema intertrigo 01/07/2024   Stage 2 skin ulcer of sacral region (HCC) 01/07/2024   CKD (chronic kidney disease) stage 4, GFR 15-29 ml/min (HCC) 01/21/2023   Skin inflammation at urostomy site [facility]) 12/22/2022   Non-recurrent bilateral inguinal hernia without obstruction or gangrene 07/15/2022   Irritant contact dermatitis associated with urinary stoma 07/02/2022   Complication of urostomy (HCC) 07/02/2022   Obesity (BMI 30.0-34.9) 04/26/2019   Venous stasis of both lower extremities 04/26/2019   Leg wound, right, subsequent encounter 12/01/2016   Hx of colonic polyps    Type 2 diabetes mellitus with diabetic chronic kidney disease (HCC) 08/04/2014   Pyelonephritis 07/08/2014   History of kidney stones 06/12/2014   Hypercalcemia 06/12/2014   Sepsis secondary to UTI (HCC) 12/03/2011   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   Type 2 diabetes mellitus (HCC) 10/09/2010   Infantile cerebral palsy (HCC) 07/11/2010   Hyperlipidemia 11/29/2009   Essential  hypertension 11/29/2009    Orientation RESPIRATION BLADDER Height & Weight     Self, Time, Situation, Place  Normal Continent Weight: 185 lb (83.9 kg) Height:  5' 8 (172.7 cm)  BEHAVIORAL SYMPTOMS/MOOD NEUROLOGICAL BOWEL NUTRITION STATUS      Continent Diet (see dc summary)  AMBULATORY STATUS COMMUNICATION OF NEEDS Skin   Limited Assist Verbally Normal                       Personal Care Assistance Level of Assistance  Bathing, Dressing, Feeding Bathing Assistance: Limited assistance Feeding assistance: Limited assistance Dressing Assistance: Limited assistance     Functional Limitations Info  Speech, Hearing, Sight Sight Info: Impaired Hearing Info: Impaired Speech Info: Adequate    SPECIAL CARE FACTORS FREQUENCY  PT (By licensed PT), OT (By licensed OT)     PT Frequency: 5x/wk OT Frequency: 5x/wk            Contractures Contractures Info: Not present    Additional Factors Info  Code Status, Allergies Code Status Info: Full code Allergies Info: Morphine  Onglyza (Saxagliptin )  Baclofen  Latex  Lisinopril  Metoclopramide Hcl  Penicillins  Tape           Current Medications (02/01/2024):  This is the current hospital active medication list Current Facility-Administered Medications  Medication Dose Route Frequency Provider Last Rate Last Admin   acetaminophen  (TYLENOL ) tablet 500 mg  500 mg Oral Q6H PRN Rizwan, Saima, MD   500 mg at 02/01/24 2058   B-complex with vitamin C tablet 1 tablet  1 tablet Oral Daily Rizwan, Saima,  MD   1 tablet at 02/01/24 0948   enoxaparin  (LOVENOX ) injection 30 mg  30 mg Subcutaneous Q24H Rizwan, Saima, MD   30 mg at 02/01/24 2058   furosemide  (LASIX ) tablet 80 mg  80 mg Oral Daily Rizwan, Saima, MD   80 mg at 02/01/24 9051   gabapentin  (NEURONTIN ) capsule 100 mg  100 mg Oral QHS PRN Rizwan, Saima, MD   100 mg at 02/01/24 2058   insulin  aspart (novoLOG ) injection 0-15 Units  0-15 Units Subcutaneous TID WC Rizwan, Saima, MD   3  Units at 02/01/24 1728   insulin  glargine-yfgn (SEMGLEE ) injection 10 Units  10 Units Subcutaneous QHS Rizwan, Saima, MD   10 Units at 02/01/24 2057   losartan  (COZAAR ) tablet 100 mg  100 mg Oral Daily Rizwan, Saima, MD   100 mg at 02/01/24 9051   ondansetron  (ZOFRAN ) tablet 4 mg  4 mg Oral Q6H PRN Rizwan, Saima, MD       Or   ondansetron  (ZOFRAN ) injection 4 mg  4 mg Intravenous Q6H PRN Rizwan, Saima, MD       Oral care mouth rinse  15 mL Mouth Rinse PRN Rizwan, Saima, MD       protein supplement (ENSURE MAX) liquid  11 oz Oral Daily Rizwan, Saima, MD   11 oz at 02/01/24 9047   simvastatin  (ZOCOR ) tablet 40 mg  40 mg Oral QPM Rizwan, Saima, MD   40 mg at 02/01/24 1728   sodium bicarbonate  tablet 650 mg  650 mg Oral BID Rizwan, Saima, MD   650 mg at 02/01/24 2058   zinc  sulfate (50mg  elemental zinc ) capsule 220 mg  220 mg Oral Daily Rizwan, Saima, MD   220 mg at 02/01/24 9051     Discharge Medications: Please see discharge summary for a list of discharge medications.  Relevant Imaging Results:  Relevant Lab Results:   Additional Information SSN [id number], [place]

## 2024-02-01 NOTE — Plan of Care (Signed)

## 2024-02-01 NOTE — Progress Notes (Signed)
 Dressing changed with husband at bedside. This nurse educated husband on step by step instructions on how to properly clean and dress the wound. Pt and husband state I think we can figure this out

## 2024-02-01 NOTE — Progress Notes (Signed)
 Mobility Specialist - Progress Note   02/01/24 1156  Mobility  Activity Pivoted/transferred from bed to chair  Level of Assistance +2 (takes two people)  Press photographer wheel walker  Range of Motion/Exercises Active Assistive  Activity Response Tolerated well  Mobility Referral Yes  Mobility visit 1 Mobility  Mobility Specialist Start Time (ACUTE ONLY) 1145  Mobility Specialist Stop Time (ACUTE ONLY) 1156  Mobility Specialist Time Calculation (min) (ACUTE ONLY) 11 min   Pt was found in bed and agreeable to transfer to recliner chair. Was left on recliner chair with all needs met. Call bell in reach and RN notified.  Erminio Leos,  Mobility Specialist Can be reached via Secure Chat

## 2024-02-01 NOTE — Plan of Care (Signed)
  Problem: Nutritional: Goal: Progress toward achieving an optimal weight will improve Outcome: Progressing   Problem: Nutritional: Goal: Maintenance of adequate nutrition will improve Outcome: Progressing

## 2024-02-02 ENCOUNTER — Other Ambulatory Visit (HOSPITAL_COMMUNITY): Payer: Self-pay

## 2024-02-02 DIAGNOSIS — L03116 Cellulitis of left lower limb: Secondary | ICD-10-CM | POA: Diagnosis not present

## 2024-02-02 LAB — CULTURE, BLOOD (ROUTINE X 2)
Culture: NO GROWTH
Culture: NO GROWTH
Special Requests: ADEQUATE

## 2024-02-02 LAB — BASIC METABOLIC PANEL WITH GFR
Anion gap: 11 (ref 5–15)
BUN: 35 mg/dL — ABNORMAL HIGH (ref 8–23)
CO2: 17 mmol/L — ABNORMAL LOW (ref 22–32)
Calcium: 8.5 mg/dL — ABNORMAL LOW (ref 8.9–10.3)
Chloride: 112 mmol/L — ABNORMAL HIGH (ref 98–111)
Creatinine, Ser: 1.84 mg/dL — ABNORMAL HIGH (ref 0.44–1.00)
GFR, Estimated: 28 mL/min — ABNORMAL LOW (ref >60)
Glucose, Bld: 100 mg/dL — ABNORMAL HIGH (ref 70–99)
Potassium: 3.7 mmol/L (ref 3.5–5.1)
Sodium: 140 mmol/L (ref 135–145)

## 2024-02-02 LAB — GLUCOSE, CAPILLARY
Glucose-Capillary: 109 mg/dL — ABNORMAL HIGH (ref 70–99)
Glucose-Capillary: 166 mg/dL — ABNORMAL HIGH (ref 70–99)

## 2024-02-02 MED ORDER — B COMPLEX-C PO TABS
1.0000 | ORAL_TABLET | Freq: Every day | ORAL | 0 refills | Status: AC
  Filled 2024-02-02: qty 30, 30d supply, fill #0

## 2024-02-02 MED ORDER — SODIUM BICARBONATE 650 MG PO TABS
650.0000 mg | ORAL_TABLET | Freq: Two times a day (BID) | ORAL | 0 refills | Status: AC
  Filled 2024-02-02: qty 60, 30d supply, fill #0

## 2024-02-02 MED ORDER — ENSURE MAX PROTEIN PO LIQD
11.0000 [oz_av] | Freq: Every day | ORAL | Status: AC
Start: 2024-02-03 — End: ?

## 2024-02-02 MED ORDER — FUROSEMIDE 80 MG PO TABS
80.0000 mg | ORAL_TABLET | Freq: Every day | ORAL | 0 refills | Status: DC
  Filled 2024-02-02: qty 30, 30d supply, fill #0

## 2024-02-02 MED ORDER — ZINC SULFATE 220 (50 ZN) MG PO CAPS
220.0000 mg | ORAL_CAPSULE | Freq: Every day | ORAL | 0 refills | Status: AC
  Filled 2024-02-02: qty 30, 30d supply, fill #0

## 2024-02-02 NOTE — Progress Notes (Signed)
 Mobility Specialist - Progress Note   02/02/24 1505  Mobility  Activity Pivoted/transferred from bed to chair  Level of Assistance +2 (takes two people)  Activity Response Tolerated well  Mobility visit 1 Mobility  Mobility Specialist Start Time (ACUTE ONLY) 1455  Mobility Specialist Stop Time (ACUTE ONLY) 1505  Mobility Specialist Time Calculation (min) (ACUTE ONLY) 10 min   RN requested assistance to transfer pt to motor wheelchair. Assisted with transfer and was left with staff for d/c.   Erminio Leos,  Mobility Specialist Can be reached via Secure Chat

## 2024-02-02 NOTE — Discharge Summary (Signed)
 Physician Discharge Summary   Patient: Jordan [name] MRN: [id number]  DOB: Apr 03, 1946   Admit:     Date of Admission: 01/28/2024 Admitted from: home   Discharge: Date of discharge: 02/02/24 Disposition: Home health Condition at discharge: fair  CODE STATUS: FULL CODE     Discharge Physician: Laneta Blunt, DO [facility]     PCP: Micheal Wolm ORN, MD  Recommendations for Outpatient Follow-up:  Follow up with PCP Micheal Wolm ORN, MD asap    Discharge Instructions     Diet Carb Modified   Complete by: As directed    Discharge wound care:   Complete by: As directed    Cleanse right foot and ankle wound and between the toes with Carolynn Soila # [phone]).  Weave dry silver hydrofiber (cut into strips) between the toes Soila [phone]) Apply single layer of xeroform to the open weeping areas on the ankle and pretibial areas.  Cover with dry dressing and secure with kerlix.  Change daily   Increase activity slowly   Complete by: As directed          Discharge Diagnoses: Principal Problem:   Cellulitis of left lower extremity Active Problems:   Essential hypertension   Infantile cerebral palsy (HCC)   Type 2 diabetes mellitus (HCC)   History of total cystectomy   Type 2 diabetes mellitus with diabetic chronic kidney disease (HCC)   CKD (chronic kidney disease) stage 4, GFR 15-29 ml/min [facility])       Hospital course / significant events:   HPI: Jordan [name] is a 78 y.o. female with medical history of diabetes mellitus, chronic kidney disease stage IV, cerebral palsy is sent to the hospital by her PCP for a wound on right foot and leg.  The patient states that was wrapped at the PCP office and she was recommended to continue the wrapping for 10 days without opening.  It was unwrapped today and she was found to have a wound with maggots for which she was sent to the ED.  She does not complain of any fevers.  She has pain mainly in the dorsum of  the right foot.  She has no other complaints.  She states that she completed her antibiotics from her prior hospital stay and has been taking her medications appropriately.  ED Course:  X-ray of the right foot reveals extensive soft tissue swelling over the dorsum of the forefoot and midfoot.  No focal soft tissue defect or air within the soft tissue Hospital course: conitnued wound care, completed 5 days abx. Was medically stable awaiting SNF but pt and her husband declined SNF placement and would rather go home. On date of dc, hospitalist observed husband change LLE bandages, he is slowly able to do so and requires referencing written instructions but he insists he is able to complete this task daily - patient is comfortable with him doing this as well. Since declining SNF, HH was arranged for patient. Patient demonstrates understanding of her medical condition, demonstrates understanding of her options for treatment/dispo, including potential consequences of declining SNF care / disadvantages of home health but she insists on home and not SNF.      Consultants:  none  Procedures/Surgeries: none      ASSESSMENT & PLAN:   Bilateral LE lymphedema Venous stasis dermatitis Venous stasis ulceration LLE Cellulitis ruled out  Orthopedic surgery consult requested-recommendations are for amputation and according to the note from Dr Barton, the patient stated that an amputation as  an option is a slap in the face and she has no intention of agreeing to it  Vascular surgery recommends ace wraps Lasix  here, continue  initially declined MRIs of the foot and ankle and leg but has agreed on 8/16- no osteomyelitis or abscess noted\ D/c with wound care instructions - patient has capacity to make this decision. Decision for home is in alignment with her values of home / independence / minimal medical intervention possible. 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  Underweight - under 18  overweight - 25 to 29 obese - 30 or more Class 1 obesity: BMI of 30.0 to 34 Class 2 obesity: BMI of 35.0 to 39 Class 3 obesity: BMI of 40.0 to 49 Super Morbid Obesity: BMI 50-59 Super-super Morbid Obesity: BMI 60+ Healthy nutrition and physical activity advised as adjunct to other disease management and risk reduction treatments             Discharge Instructions  Allergies as of 02/02/2024       Reactions   Morphine Shortness Of Breath   Onglyza [saxagliptin ] Other (See Comments)   Headache within 45 minutes of taking this.   Baclofen Other (See Comments)   GI upset   Latex Other (See Comments)   Irritates the skin sometimes   Lisinopril Cough   Metoclopramide Hcl Other (See Comments)   Reglan = irritable   Penicillins Hives, Other (See Comments)    MAJOR CASE OF HIVES   Tape Other (See Comments)   Irritates the skin sometimes        Medication List     STOP taking these medications    doxycycline  100 MG tablet Commonly known as: VIBRA -TABS   Gerhardt's butt cream Crea   nystatin  powder Commonly known as: MYCOSTATIN /NYSTOP        TAKE these medications    Accu-Chek FastClix Lancets Misc Use to check blood sugar once daily. Dx  Code E11.65   TRUEplus Lancets 28G Misc Use as directed   Accu-Chek Nano SmartView w/Device Kit Use to check blood glucose 2 times daily.   True Metrix Meter Devi Use as directed   acetaminophen  500 MG tablet Commonly known as: TYLENOL  Take 1,000 mg by mouth every evening. Pain   Alcohol  Swabs  Pads Use as directed   B-complex with vitamin C tablet Take 1 tablet by mouth daily. Start taking on: February 03, 2024   Ensure Max Protein Liqd Take 330 mLs (11 oz total) by mouth daily. Start taking on: February 03, 2024   furosemide  80 MG tablet Commonly known as: LASIX  Take 1 tablet (80 mg total) by mouth daily. Start taking on: February 03, 2024 What changed:  medication strength how much to take when to take this reasons to take this  gabapentin  100 MG capsule Commonly known as: NEURONTIN  TAKE 1 TO 2 CAPSULES BY MOUTH AT NIGHT AS NEEDED FOR NERVE PAIN What changed: See the new instructions.   glucose blood test strip Commonly known as: Accu-Chek SmartView USE TO CHECK BLOOD GLUCOSE TWICE DAILY AS DIRECTED   losartan  100 MG tablet Commonly known as: COZAAR  TAKE 1 TABLET BY MOUTH DAILY What changed: when to take this   multivitamin capsule Take 1 capsule by mouth 2 (two) times a week.   simvastatin  40 MG tablet Commonly known as: ZOCOR  TAKE 1 TABLET BY MOUTH EVERY EVENING   sodium bicarbonate  650 MG tablet Take 1 tablet (650 mg total) by mouth 2 (two) times daily.   True Metrix Level 1 Low Soln Use as directed   Trulicity  0.75 MG/0.5ML Soaj Generic drug: Dulaglutide  Inject 0.75 mg into the skin every Wednesday.   zinc  sulfate (50mg  elemental zinc ) 220 (50 Zn) MG capsule Take 1 capsule (220 mg total) by mouth daily. Start taking on: February 03, 2024               Discharge Care Instructions  (From admission, onward)           Start     Ordered   02/02/24 0000  Discharge wound care:       Comments: Cleanse right foot and ankle wound and  between the toes with Carolynn Soila # [phone]).  Weave dry silver hydrofiber (cut into strips) between the toes Soila [phone]) Apply single layer of xeroform to the open weeping areas on the ankle and pretibial areas.  Cover with dry dressing and secure with kerlix.  Change daily   02/02/24 1350             Follow-up Information     Burchette, Wolm ORN, MD. Schedule an appointment as soon as possible for a visit.   Specialty: Family Medicine Why: hospital follow up wound check asap Contact information: [address] [place] [facility] [place] [address] [phone]         [facility], [facility] Follow up.   Specialty: Home Health Services Contact information: [address] [place] [address] [phone]                 Allergies  Allergen Reactions   Morphine Shortness Of Breath   Onglyza [Saxagliptin ] Other (See Comments)    Headache within 45 minutes of taking this.   Baclofen Other (See Comments)    GI upset   Latex Other (See Comments)    Irritates the skin sometimes   Lisinopril Cough   Metoclopramide Hcl Other (See Comments)    Reglan = irritable   Penicillins Hives and Other (See Comments)     MAJOR CASE OF HIVES   Tape Other (See Comments)    Irritates the skin sometimes     Subjective: pt reports swelling is okay today, not painful, she is okay with her husband changing her bandages at home and she does NOT want to go to rehab / SNF for this    Discharge Exam: BP (!) 156/77   Pulse 82   Temp 98.8 F (37.1 C) (Oral)   Resp 18   Ht 5' 8 (1.727 m)   Wt 83.9 kg   SpO2 98%   BMI 28.13 kg/m  General: Pt is alert, awake, not in acute distress Cardiovascular: RRR, S1/S2 +, no rubs, no gallops Respiratory: CTA bilaterally, no wheezing, no rhonchi Abdominal: Soft, NT, ND, bowel sounds +  Extremities: (+)1-2 edema, no cyanosis, bandages removed LLE and there is ulceration but no tenderness/warmth, capillary refill is WNL,  no purulent discharge, wounds appear to be generating minimal granulation tissue     The results of significant diagnostics from this hospitalization (including imaging, microbiology, ancillary and laboratory) are listed below for reference.     Microbiology: Recent Results (from the past 240 hours)  Blood Cultures x 2 sites     Status: None   Collection Time: 01/28/24  2:28 PM   Specimen: BLOOD LEFT ARM  Result Value Ref Range Status   Specimen Description   Final    BLOOD LEFT ARM Performed at [facility], [address] [place], [place] [address]    Special Requests   Final    BOTTLES DRAWN AEROBIC AND ANAEROBIC Blood Culture adequate volume Performed at [facility], [address], [place], [place] [address]    Culture   Final    NO GROWTH 5 DAYS Performed at [facility], [address] [place], [place] [address]    Report Status 02/02/2024 FINAL  Final  Blood Cultures x 2 sites     Status: None   Collection Time: 01/28/24  3:16 PM   Specimen: BLOOD RIGHT ARM  Result Value Ref Range Status   Specimen Description   Final    BLOOD RIGHT ARM Performed at [facility], [address] [place], [place] [address]    Special Requests   Final    BOTTLES DRAWN AEROBIC AND ANAEROBIC Blood Culture results may not be optimal due to an inadequate volume of blood received in culture bottles Performed at [facility], [address] [place], [place] [address]    Culture   Final    NO GROWTH 5 DAYS Performed at [facility], [address] [place], [place] [address]    Report Status 02/02/2024 FINAL  Final     Labs: BNP (last 3 results) No results for input(s): BNP in the last 8760 hours. Basic Metabolic Panel: Recent Labs  Lab 01/29/24 0416 01/30/24 0424 01/31/24 0356 02/01/24 0407 02/02/24 0454  NA 135 139 138 141 140  K 4.2 4.2 3.4* 3.9 3.7  CL 111 113* 112* 112* 112*  CO2 13* 15* 19* 19* 17*   GLUCOSE 138* 124* 175* 155* 100*  BUN 54* 41* 40* 36* 35*  CREATININE 2.03* 2.09* 2.10* 1.91* 1.84*  CALCIUM 8.6* 8.4* 8.2* 8.5* 8.5*   Liver Function Tests: Recent Labs  Lab 01/28/24 1428  AST 18  ALT 16  ALKPHOS 93  BILITOT 0.5  PROT 6.9  ALBUMIN 3.0*   No results for input(s): LIPASE, AMYLASE in the last 168 hours. No results for input(s): AMMONIA in the last 168 hours. CBC: Recent Labs  Lab 01/28/24 1428 01/28/24 1825 01/30/24 0424  WBC 8.9 8.9 7.5  NEUTROABS 6.1  --   --   HGB 9.6* 8.4* 8.6*  HCT 33.1* 29.3* 30.3*  MCV 90.9 92.4 91.5  PLT 335 277 266   Cardiac Enzymes: No results for input(s): CKTOTAL, CKMB, CKMBINDEX, TROPONINI in the last 168 hours. BNP: Invalid input(s): POCBNP CBG: Recent Labs  Lab 02/01/24 1149 02/01/24 1643 02/01/24 2244 02/02/24 0727 02/02/24 1201  GLUCAP 140* 155* 143* 109* 166*   D-Dimer No results for input(s): DDIMER in the last 72 hours. Hgb A1c No results for input(s): HGBA1C in the last 72 hours. Lipid Profile No results for input(s): CHOL, HDL,  LDLCALC, TRIG, CHOLHDL, LDLDIRECT in the last 72 hours. Thyroid  function studies No results for input(s): TSH, T4TOTAL, T3FREE, THYROIDAB in the last 72 hours.  Invalid input(s): FREET3 Anemia work up No results for input(s): VITAMINB12, FOLATE, FERRITIN, TIBC, IRON, RETICCTPCT in the last 72 hours. Urinalysis    Component Value Date/Time   COLORURINE YELLOW 05/01/2022 1603   APPEARANCEUR HAZY (A) 05/01/2022 1603   LABSPEC 1.011 05/01/2022 1603   PHURINE 6.5 05/01/2022 1603   GLUCOSEU NEGATIVE 05/01/2022 1603   HGBUR NEGATIVE 05/01/2022 1603   BILIRUBINUR NEGATIVE 05/01/2022 1603   BILIRUBINUR neg 04/02/2017 1110   KETONESUR NEGATIVE 05/01/2022 1603   PROTEINUR TRACE (A) 05/01/2022 1603   UROBILINOGEN 0.2 04/02/2017 1110   UROBILINOGEN 0.2 07/08/2014 1905   NITRITE NEGATIVE 05/01/2022 1603   LEUKOCYTESUR LARGE  (A) 05/01/2022 1603   Sepsis Labs Recent Labs  Lab 01/28/24 1428 01/28/24 1825 01/30/24 0424  WBC 8.9 8.9 7.5   Microbiology Recent Results (from the past 240 hours)  Blood Cultures x 2 sites     Status: None   Collection Time: 01/28/24  2:28 PM   Specimen: BLOOD LEFT ARM  Result Value Ref Range Status   Specimen Description   Final    BLOOD LEFT ARM Performed at [facility], [address] [place], [place] [address]    Special Requests   Final    BOTTLES DRAWN AEROBIC AND ANAEROBIC Blood Culture adequate volume Performed at [facility], [address] [place], [place] [address]    Culture   Final    NO GROWTH 5 DAYS Performed at [facility], [address] [place], [place] [address]    Report Status 02/02/2024 FINAL  Final  Blood Cultures x 2 sites     Status: None   Collection Time: 01/28/24  3:16 PM   Specimen: BLOOD RIGHT ARM  Result Value Ref Range Status   Specimen Description   Final    BLOOD RIGHT ARM Performed at [facility], [address], [place], [place] [address]    Special Requests   Final    BOTTLES DRAWN AEROBIC AND ANAEROBIC Blood Culture results may not be optimal due to an inadequate volume of blood received in culture bottles Performed at [facility], [address], [place], [place] [address]    Culture   Final    NO GROWTH 5 DAYS Performed at [facility], [address] [place], [place] [address]    Report Status 02/02/2024 FINAL  Final   Imaging DG Foot Complete Right Result Date: 01/28/2024 CLINICAL DATA:  92319 Cellulitis 92319. EXAM: RIGHT FOOT COMPLETE - 3+ VIEW COMPARISON:  Right ankle from 01/07/2024. FINDINGS: There is diffuse osteopenia of the visualized osseous structures. No acute fracture or dislocation. No aggressive osseous lesion. Old healed fracture deformity and threaded cortical screw in the distal tibia again seen. Mild diffuse degenerative changes of  imaged joints with asymmetric moderate-to-severe involvement of first metatarsophalangeal joint. There is probable fusion of the ankle joint. There is extensive soft tissue swelling over the dorsum of the forefoot and midfoot. No focal soft tissue defect or air within the soft tissue. No radiopaque foreign bodies. IMPRESSION: *No acute osseous abnormality of the right foot. Extensive soft tissue swelling over the dorsum of the forefoot and midfoot. No focal soft tissue defect or air within the soft tissue. Electronically Signed   By: Ree Molt M.D.   On: 01/28/2024 14:45  Time coordinating discharge: over 30 minutes  SIGNED:  Rhydian Baldi DO [facility]

## 2024-02-02 NOTE — Progress Notes (Signed)
 Discharge medication delivered at bedside [facility]

## 2024-02-02 NOTE — Plan of Care (Signed)

## 2024-02-02 NOTE — Progress Notes (Signed)
 Pt and husband were given the option to go to a SNF but refused that option, they were also given the option to have transport take the pt home and they both also refused as they transported themselves here with private vehicles. Home health was set up for them and first appt was made as they were agreeable to that option. The patient and her husband were given all discharge instructions and educated on wound care, follow ups and medications were brought up from pharmacy, they both verbalized understanding. The husband was able to demonstrate the wound care instructions and was given supplies. Pt was transferred to her wheelchair, IV was removed with catheter intact and belongings were packed and taken by husband. Pt was escorted to her vehicle with staff.

## 2024-02-03 ENCOUNTER — Telehealth: Payer: Self-pay | Admitting: *Deleted

## 2024-02-03 ENCOUNTER — Telehealth: Payer: Self-pay | Admitting: Family Medicine

## 2024-02-03 NOTE — Telephone Encounter (Signed)
 Left a message for patient's husband to return my call to inform him we have no available openings until scheduled appt time

## 2024-02-03 NOTE — Telephone Encounter (Signed)
 Copied from CRM [id number]. Topic: Appointments - Scheduling Inquiry for Clinic >> Feb 03, 2024  8:42 AM Adelita E wrote: Reason for CRM: Patient's spouse, Debby, called in needing to schedule a hospital follow-up for his wife. Patient got discharged from [facility] yesterday 8/19, was able to populate appointments on 8/26 with PCP, but spouse said that was too late and would prefer something this week as the patient has dressings that need to be changed and would like to set up in-home care.

## 2024-02-03 NOTE — Transitions of Care (Post Inpatient/ED Visit) (Signed)
   02/03/2024  Name: Jordan [name] MRN: [id number] DOB: [birth year]  Today's TOC FU Call Status: Today's TOC FU Call Status:: Unsuccessful Call (1st Attempt) Unsuccessful Call (1st Attempt) Date: 02/03/24  Attempted to reach the patient regarding the most recent Inpatient/ED visit.  Follow Up Plan: Additional outreach attempts will be made to reach the patient to complete the Transitions of Care (Post Inpatient/ED visit) call.   Cathlean Headland BSN RN [facility] Health Care Management Coordinator [email or web address] Direct Dial: [phone]  Fax: [phone] Website: [email or web address]

## 2024-02-04 ENCOUNTER — Telehealth: Payer: Self-pay

## 2024-02-04 ENCOUNTER — Ambulatory Visit: Admitting: Family Medicine

## 2024-02-04 ENCOUNTER — Encounter: Payer: Self-pay | Admitting: Family Medicine

## 2024-02-04 VITALS — BP 134/60 | HR 80 | Temp 98.6°F | Wt 185.0 lb

## 2024-02-04 DIAGNOSIS — G809 Cerebral palsy, unspecified: Secondary | ICD-10-CM | POA: Diagnosis not present

## 2024-02-04 DIAGNOSIS — E1136 Type 2 diabetes mellitus with diabetic cataract: Secondary | ICD-10-CM | POA: Diagnosis not present

## 2024-02-04 DIAGNOSIS — E11628 Type 2 diabetes mellitus with other skin complications: Secondary | ICD-10-CM

## 2024-02-04 DIAGNOSIS — L03115 Cellulitis of right lower limb: Secondary | ICD-10-CM

## 2024-02-04 DIAGNOSIS — K219 Gastro-esophageal reflux disease without esophagitis: Secondary | ICD-10-CM | POA: Diagnosis not present

## 2024-02-04 DIAGNOSIS — E1122 Type 2 diabetes mellitus with diabetic chronic kidney disease: Secondary | ICD-10-CM | POA: Diagnosis not present

## 2024-02-04 DIAGNOSIS — Z794 Long term (current) use of insulin: Secondary | ICD-10-CM

## 2024-02-04 DIAGNOSIS — E785 Hyperlipidemia, unspecified: Secondary | ICD-10-CM | POA: Diagnosis not present

## 2024-02-04 DIAGNOSIS — L97912 Non-pressure chronic ulcer of unspecified part of right lower leg with fat layer exposed: Secondary | ICD-10-CM | POA: Diagnosis not present

## 2024-02-04 DIAGNOSIS — Z9181 History of falling: Secondary | ICD-10-CM | POA: Diagnosis not present

## 2024-02-04 DIAGNOSIS — N184 Chronic kidney disease, stage 4 (severe): Secondary | ICD-10-CM | POA: Diagnosis not present

## 2024-02-04 DIAGNOSIS — I872 Venous insufficiency (chronic) (peripheral): Secondary | ICD-10-CM | POA: Diagnosis not present

## 2024-02-04 DIAGNOSIS — I129 Hypertensive chronic kidney disease with stage 1 through stage 4 chronic kidney disease, or unspecified chronic kidney disease: Secondary | ICD-10-CM | POA: Diagnosis not present

## 2024-02-04 DIAGNOSIS — I890 Lymphedema, not elsewhere classified: Secondary | ICD-10-CM | POA: Diagnosis not present

## 2024-02-04 DIAGNOSIS — Z7985 Long-term (current) use of injectable non-insulin antidiabetic drugs: Secondary | ICD-10-CM | POA: Diagnosis not present

## 2024-02-04 DIAGNOSIS — Z860100 Personal history of colon polyps, unspecified: Secondary | ICD-10-CM | POA: Diagnosis not present

## 2024-02-04 NOTE — Transitions of Care (Post Inpatient/ED Visit) (Signed)
   02/04/2024  Name: Jordan [name] MRN: [id number] DOB: 1945-11-05  Today's TOC FU Call Status: Today's TOC FU Call Status:: Unsuccessful Call (2nd Attempt) Unsuccessful Call (2nd Attempt) Date: 02/04/24  Attempted to reach the patient regarding the most recent Inpatient/ED visit.  Follow Up Plan: Additional outreach attempts will be made to reach the patient to complete the Transitions of Care (Post Inpatient/ED visit) call.   Alan Ee, RN, BSN, CEN [employer]- Transition of Care Team.  [facility] [phone]

## 2024-02-04 NOTE — Progress Notes (Signed)
   Subjective:    Patient ID: Jordan [name], female    DOB: 1945-07-01, 78 y.o.   MRN: [id number]  HPI Here to follow up a hospital stay from 01-28-24 to 02-02-24 for cellulitis of the right foot. We saw her to day of admission when her foot was found to be swollen and infected and covered with maggots. At the ED her WBC was normal at 8.6, creatinine was stable at 2.07, and glucose was 145 (her A1c on 12-21-23 was 8.2%). Xrays of the foot revealed soft tissue swelling but no osteomyelitis. Orthopedics was consulted, but no debridement was performed. Wound cultures remained negative. She was treated with 5 days of antibiotics, but she was not sent home with any antibiotics. Is recommended that she got to a SNF, but she refused. Instead she was sent home with her husband, and daily dressing changes were recommended. A referral to Home Health was placed.  Her am fasting glucoses the past 2 days have been 110 and 135. Still has some discomfort in the foot, but this has improved and is easily managed with Tylenol .   Review of Systems  Constitutional: Negative.   Respiratory: Negative.    Cardiovascular:  Positive for leg swelling.  Skin:  Positive for wound.       Objective:   Physical Exam Constitutional:      General: She is not in acute distress.    Comments: In a wheelchair   Cardiovascular:     Rate and Rhythm: Normal rate and regular rhythm.     Pulses: Normal pulses.     Heart sounds: Normal heart sounds.  Pulmonary:     Effort: Pulmonary effort is normal.     Breath sounds: Normal breath sounds.  Skin:    Comments: The right dorsal foot has improved quite a bit since the last time was saw her. She has swelling and the skin is pink but not warm. She is mildly tender. There are no breaks in the skin and no fluid drainage   Neurological:     Mental Status: She is alert.           Assessment & Plan:  The cellulitis in the right foot is slowly improving with daily dressing changes. A  nurse from [facility] is to meet her later today for an assessment. Her diabetes is well controlled. She will follow up with Dr. Micheal on 02-08-24. We spent a total of ( 35  ) minutes reviewing records and discussing these issues.  Garnette Olmsted, MD

## 2024-02-04 NOTE — Telephone Encounter (Signed)
 Copied from CRM [id number]. Topic: Referral - Question >> Feb 04, 2024  1:07 PM Chasity T wrote: Reason for CRM: Karna from [facility] is calling in to request a woundcare referral for patient to be looked at due to it still red in the area. She also has questions regarding medical history. Please contact her back at [phone].

## 2024-02-04 NOTE — Telephone Encounter (Signed)
 Patient seen in office today by Dr. Johnny

## 2024-02-05 ENCOUNTER — Telehealth: Payer: Self-pay

## 2024-02-05 NOTE — Transitions of Care (Post Inpatient/ED Visit) (Signed)
   02/05/2024  Name: Jordan [name] MRN: [id number] DOB: Feb 17, 1946  Today's TOC FU Call Status: Today's TOC FU Call Status:: Unsuccessful Call (3rd Attempt) Unsuccessful Call (3rd Attempt) Date: 02/05/24  Attempted to reach the patient regarding the most recent Inpatient/ED visit.  Follow Up Plan: No further outreach attempts will be made at this time. We have been unable to contact the patient.  Alan Ee, RN, BSN, CEN [employer]- Transition of Care Team.  [facility] [phone]

## 2024-02-08 ENCOUNTER — Encounter: Payer: Self-pay | Admitting: Family Medicine

## 2024-02-08 ENCOUNTER — Ambulatory Visit (INDEPENDENT_AMBULATORY_CARE_PROVIDER_SITE_OTHER): Admitting: Family Medicine

## 2024-02-08 VITALS — BP 146/60 | HR 72 | Temp 97.8°F

## 2024-02-08 DIAGNOSIS — R600 Localized edema: Secondary | ICD-10-CM

## 2024-02-08 DIAGNOSIS — E11628 Type 2 diabetes mellitus with other skin complications: Secondary | ICD-10-CM | POA: Diagnosis not present

## 2024-02-08 DIAGNOSIS — Z860100 Personal history of colon polyps, unspecified: Secondary | ICD-10-CM | POA: Diagnosis not present

## 2024-02-08 DIAGNOSIS — E1136 Type 2 diabetes mellitus with diabetic cataract: Secondary | ICD-10-CM | POA: Diagnosis not present

## 2024-02-08 DIAGNOSIS — E1122 Type 2 diabetes mellitus with diabetic chronic kidney disease: Secondary | ICD-10-CM | POA: Diagnosis not present

## 2024-02-08 DIAGNOSIS — N184 Chronic kidney disease, stage 4 (severe): Secondary | ICD-10-CM | POA: Diagnosis not present

## 2024-02-08 DIAGNOSIS — L97912 Non-pressure chronic ulcer of unspecified part of right lower leg with fat layer exposed: Secondary | ICD-10-CM | POA: Diagnosis not present

## 2024-02-08 DIAGNOSIS — E785 Hyperlipidemia, unspecified: Secondary | ICD-10-CM | POA: Diagnosis not present

## 2024-02-08 DIAGNOSIS — I129 Hypertensive chronic kidney disease with stage 1 through stage 4 chronic kidney disease, or unspecified chronic kidney disease: Secondary | ICD-10-CM | POA: Diagnosis not present

## 2024-02-08 DIAGNOSIS — Z7985 Long-term (current) use of injectable non-insulin antidiabetic drugs: Secondary | ICD-10-CM

## 2024-02-08 DIAGNOSIS — S91301A Unspecified open wound, right foot, initial encounter: Secondary | ICD-10-CM | POA: Diagnosis not present

## 2024-02-08 DIAGNOSIS — K219 Gastro-esophageal reflux disease without esophagitis: Secondary | ICD-10-CM | POA: Diagnosis not present

## 2024-02-08 DIAGNOSIS — I890 Lymphedema, not elsewhere classified: Secondary | ICD-10-CM | POA: Diagnosis not present

## 2024-02-08 DIAGNOSIS — G809 Cerebral palsy, unspecified: Secondary | ICD-10-CM | POA: Diagnosis not present

## 2024-02-08 DIAGNOSIS — I872 Venous insufficiency (chronic) (peripheral): Secondary | ICD-10-CM | POA: Diagnosis not present

## 2024-02-08 DIAGNOSIS — L03115 Cellulitis of right lower limb: Secondary | ICD-10-CM | POA: Diagnosis not present

## 2024-02-08 DIAGNOSIS — Z9181 History of falling: Secondary | ICD-10-CM | POA: Diagnosis not present

## 2024-02-08 NOTE — Progress Notes (Signed)
 Established Patient Office Visit  Subjective   Patient ID: Jordan [name], female    DOB: August 30, 1945  Age: 78 y.o. MRN: [id number]  Chief Complaint  Patient presents with   Medical Management of Chronic Issues    HPI   Jordan [name] is seen accompanied by husband for follow-up regarding recent hospitalization.  She had had recent hospitalization for ulcer right ankle and lower leg with cellulitis in the setting of chronic lower extremity edema and type 2 diabetes.  CT scan no obvious osteomyelitis.  She was finishing up doxycycline  when we saw her on the first of this month.  We set up home health referral but they refused to let anyone come out.  They also refused wound care referral.  She was then seen back for follow-up in the office on the 14th by another provider here with recurrent cellulitis right foot with infection covered with maggots.  When she arrived to the hospital she apparently informed folks there that she had been instructed to leave her dressing in place for 10 days without removing which was obviously in error.    She received 5 days of antibiotics in hospital.  They recommended skilled nursing facility but husband and apparently patient declined.  They did apparently agree to home health referral and patient states they came out earlier today.  There was reportedly orthopedic surgery consult.  She did have MRI foot and ankle which showed no osteomyelitis or abscess.  Other medical problems include type 2 diabetes with recent A1c 8.2%, hypertension, history of hypokalemia, history of total cystectomy, chronic kidney disease stage IV, history of cerebral palsy and CVA Blood cultures were negative.  Both she and her husband have had some cognitive decline so history is not entirely reliable.  Past Medical History:  Diagnosis Date   Arthritis    OA   Back pain    Bilateral swelling of feet    Blood transfusion 1992   AFTER  SURGERY TO REMOVE BLADDER   Cataract    CEREBRAL  PALSY 07/11/2010   PT CAN TRANSFER BED TO CHAIR--CAN STAND BRIEFLY--BUT NOT ABLE TO WALK; RT LEG HYPEREXTENDS BACKWARD AND IS WEAK-HAS A POWER CHAIR   COLONIC POLYPS 07/11/2010   Constipation    Diabetes mellitus    ORAL MED FOR DIABETES   Gallbladder problem    GERD (gastroesophageal reflux disease)    PAST HISTORY GERD--WATCHES DIET - NOT ON ANY MEDS FOR GERD   HYPERLIPIDEMIA 11/29/2009   HYPERTENSION 11/29/2009   Incisional hernia    NO PAIN-NOT CAUSING ANY DISCOMFORT   Kidney infection    Osteoarthritis    Osteomyelitis (HCC)    PONV (postoperative nausea and vomiting)    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HCC)    11-28-14 remains   Shortness of breath    Stroke (cerebrum) (HCC)    Past Surgical History:  Procedure Laterality Date   1992     ABDOMINAL HYSTERECTOMY  1990   partial   ACHILLES TENDON LENGTHENING  1962   ADENOIDECTOMY  1950   APPENDECTOMY     CARPAL TUNNEL RELEASE  1983   CHOLECYSTECTOMY     COLONOSCOPY WITH PROPOFOL  N/A 12/07/2014   Procedure: COLONOSCOPY WITH PROPOFOL ;  Surgeon: Jordan FORBES Commander, MD;  Location: [facility];  Service: Endoscopy;  Laterality: N/A;   LEFT CARPAL TUNNEL RELEASE X 2     LUMBAR SYMPATHETECTOMIES X 2     MULTIPLE RIGHT ANKLE SURGERIES     NEPHROLITHOTOMY  10/01/2011   Procedure: NEPHROLITHOTOMY  PERCUTANEOUS;  Surgeon: Garnette Shack, MD;  Location: [facility];  Service: Urology;  Laterality: Right;        PARTIAL AMPUTATION OF LEFT GREAT TOE     1985   radical cystectomy  1992   neurogenic bladder sec CP   REPAIR OF NASAL FRACTURE  1985   REVISION UROSTOMY CUTANEOUS  1992   RIGHT CARPAL TUNNEL RELEASE     RIGHT HIP FLEXION CONTRACTURE REPAIR  1986   RT ANKLE FUSION  1975   SEVERAL RT ANKLE SURGERIES PRIOR TO THE FUSION   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 She has never used smokeless  tobacco. She reports that she does not drink alcohol  and does not use drugs. family history includes Alcoholism in her father and mother; Bladder Cancer in her father; Cancer in her father; Colon cancer (age of onset: 25) in her father; Colon polyps in her sister; Diabetes in her father; Emphysema in her father; Heart attack in her maternal grandfather; Heart disease in her father and maternal grandmother; Heart disease (age of onset: 80) in her mother; Hyperlipidemia in her father and mother; Hypertension in her mother; Liver disease in her father and mother; Sleep apnea in her father. Allergies  Allergen Reactions   Morphine Shortness Of Breath   Onglyza [Saxagliptin ] Other (See Comments)    Headache within 45 minutes of taking this.   Baclofen Other (See Comments)    GI upset   Latex Other (See Comments)    Irritates the skin sometimes   Lisinopril Cough   Metoclopramide Hcl Other (See Comments)    Reglan = irritable   Penicillins Hives and Other (See Comments)     MAJOR CASE OF HIVES   Tape Other (See Comments)    Irritates the skin sometimes    Review of Systems  Constitutional:  Negative for chills and fever.  Cardiovascular:  Negative for chest pain.  Gastrointestinal:  Negative for nausea and vomiting.      Objective:     BP (!) 146/60   Pulse 72   Temp 97.8 F (36.6 C) (Oral)   SpO2 95%  BP Readings from Last 3 Encounters:  02/08/24 (!) 146/60  02/04/24 134/60  02/02/24 (!) 156/77   Wt Readings from Last 3 Encounters:  02/04/24 185 lb (83.9 kg)  01/28/24 185 lb (83.9 kg)  01/28/24 185 lb (83.9 kg)      Physical Exam Vitals reviewed.  Constitutional:      General: She is not in acute distress.    Appearance: She is not toxic-appearing.  Cardiovascular:     Rate and Rhythm: Normal rate and regular rhythm.  Skin:    Comments: Superficial ulceration dorsum right foot.  No foul odor.  She has chronic edema and some chronic ichthyosis of the skin.  No  visible necrosis.  No crepitus.  Neurological:     Mental Status: She is alert.      No results found for any visits on 02/08/24.  Last CBC Lab Results  Component Value Date   WBC 7.5 01/30/2024   HGB 8.6 (L) 01/30/2024   HCT 30.3 (L) 01/30/2024   MCV 91.5 01/30/2024   MCH 26.0 01/30/2024   RDW 15.2 01/30/2024   PLT 266 01/30/2024   Last metabolic panel Lab  Results  Component Value Date   GLUCOSE 100 (H) 02/02/2024   NA 140 02/02/2024   K 3.7 02/02/2024   CL 112 (H) 02/02/2024   CO2 17 (L) 02/02/2024   BUN 35 (H) 02/02/2024   CREATININE 1.84 (H) 02/02/2024   GFRNONAA 28 (L) 02/02/2024   CALCIUM 8.5 (L) 02/02/2024   PHOS 3.6 07/09/2014   PROT 6.9 01/28/2024   ALBUMIN 3.0 (L) 01/28/2024   BILITOT 0.5 01/28/2024   ALKPHOS 93 01/28/2024   AST 18 01/28/2024   ALT 16 01/28/2024   ANIONGAP 11 02/02/2024   Last hemoglobin A1c Lab Results  Component Value Date   HGBA1C 8.2 (H) 12/21/2023   Last thyroid  functions Lab Results  Component Value Date   TSH 2.095 10/26/2023   T3TOTAL 90 01/26/2019      The ASCVD Risk score (Arnett DK, et al., 2019) failed to calculate for the following reasons:   Risk score cannot be calculated because patient has a medical history suggesting prior/existing ASCVD    Assessment & Plan:   #1 recent cellulitis right foot with superficial skin ulceration.  MRI showed no evidence for abscess or osteomyelitis.  She has complicating features including diabetes, sedentary, chronic lower extremity edema.  Dressing removed today and wound was cleaned and reapplied Vaseline gauze along with pressure wrap.  They have home health nursing coming out to reassess.  They have refused wound care center.  Husband knows how to change dressing and we have again reiterated today to change dressing daily.  Follow-up promptly for any erythema, drainage, or signs of secondary infection  #2 type 2 diabetes with recent A1c 8.2%.  We had recommended increasing her  Trulicity  1.5 mg subcutaneous weekly and not clear if they have done so yet.  Will need follow-up in a couple weeks and discuss further at that time.  Complication of skin infection  #3 chronic lower extremity edema.  She has significant venous stasis.  Not able to ambulate much.  She does try to elevate as much as she can.  Need to focus on compression.  Continue furosemide .  Wolm Scarlet, MD

## 2024-02-08 NOTE — Patient Instructions (Addendum)
 Change dressing DAILY as instructed  Gently clean with soap and water and let air dry before re-dressing.  Continue with elevation and compression and daily use of Lasix .

## 2024-02-08 NOTE — Telephone Encounter (Signed)
Patient has appointment today to discuss

## 2024-02-08 NOTE — Telephone Encounter (Signed)
 Left a message for the patient to return my call.

## 2024-02-11 DIAGNOSIS — E785 Hyperlipidemia, unspecified: Secondary | ICD-10-CM | POA: Diagnosis not present

## 2024-02-11 DIAGNOSIS — L97912 Non-pressure chronic ulcer of unspecified part of right lower leg with fat layer exposed: Secondary | ICD-10-CM | POA: Diagnosis not present

## 2024-02-11 DIAGNOSIS — Z7985 Long-term (current) use of injectable non-insulin antidiabetic drugs: Secondary | ICD-10-CM | POA: Diagnosis not present

## 2024-02-11 DIAGNOSIS — Z9181 History of falling: Secondary | ICD-10-CM | POA: Diagnosis not present

## 2024-02-11 DIAGNOSIS — I129 Hypertensive chronic kidney disease with stage 1 through stage 4 chronic kidney disease, or unspecified chronic kidney disease: Secondary | ICD-10-CM | POA: Diagnosis not present

## 2024-02-11 DIAGNOSIS — Z860100 Personal history of colon polyps, unspecified: Secondary | ICD-10-CM | POA: Diagnosis not present

## 2024-02-11 DIAGNOSIS — N184 Chronic kidney disease, stage 4 (severe): Secondary | ICD-10-CM | POA: Diagnosis not present

## 2024-02-11 DIAGNOSIS — E1136 Type 2 diabetes mellitus with diabetic cataract: Secondary | ICD-10-CM | POA: Diagnosis not present

## 2024-02-11 DIAGNOSIS — I890 Lymphedema, not elsewhere classified: Secondary | ICD-10-CM | POA: Diagnosis not present

## 2024-02-11 DIAGNOSIS — K219 Gastro-esophageal reflux disease without esophagitis: Secondary | ICD-10-CM | POA: Diagnosis not present

## 2024-02-11 DIAGNOSIS — G809 Cerebral palsy, unspecified: Secondary | ICD-10-CM | POA: Diagnosis not present

## 2024-02-11 DIAGNOSIS — I872 Venous insufficiency (chronic) (peripheral): Secondary | ICD-10-CM | POA: Diagnosis not present

## 2024-02-11 DIAGNOSIS — L03115 Cellulitis of right lower limb: Secondary | ICD-10-CM | POA: Diagnosis not present

## 2024-02-11 DIAGNOSIS — E1122 Type 2 diabetes mellitus with diabetic chronic kidney disease: Secondary | ICD-10-CM | POA: Diagnosis not present

## 2024-02-13 ENCOUNTER — Other Ambulatory Visit: Payer: Self-pay

## 2024-02-13 ENCOUNTER — Emergency Department (HOSPITAL_COMMUNITY)
Admission: EM | Admit: 2024-02-13 | Discharge: 2024-02-13 | Disposition: A | Discharge status: Home / Self Care | Attending: Emergency Medicine | Admitting: Emergency Medicine

## 2024-02-13 ENCOUNTER — Emergency Department (HOSPITAL_COMMUNITY)

## 2024-02-13 DIAGNOSIS — T82519A Breakdown (mechanical) of unspecified cardiac and vascular devices and implants, initial encounter: Secondary | ICD-10-CM | POA: Diagnosis not present

## 2024-02-13 DIAGNOSIS — E1122 Type 2 diabetes mellitus with diabetic chronic kidney disease: Secondary | ICD-10-CM | POA: Insufficient documentation

## 2024-02-13 DIAGNOSIS — Z79899 Other long term (current) drug therapy: Secondary | ICD-10-CM | POA: Diagnosis not present

## 2024-02-13 DIAGNOSIS — Z743 Need for continuous supervision: Secondary | ICD-10-CM | POA: Diagnosis not present

## 2024-02-13 DIAGNOSIS — R6889 Other general symptoms and signs: Secondary | ICD-10-CM | POA: Diagnosis not present

## 2024-02-13 DIAGNOSIS — Z91040 Latex allergy status: Secondary | ICD-10-CM | POA: Diagnosis not present

## 2024-02-13 DIAGNOSIS — L03115 Cellulitis of right lower limb: Secondary | ICD-10-CM | POA: Diagnosis not present

## 2024-02-13 DIAGNOSIS — N189 Chronic kidney disease, unspecified: Secondary | ICD-10-CM | POA: Diagnosis not present

## 2024-02-13 DIAGNOSIS — I129 Hypertensive chronic kidney disease with stage 1 through stage 4 chronic kidney disease, or unspecified chronic kidney disease: Secondary | ICD-10-CM | POA: Insufficient documentation

## 2024-02-13 DIAGNOSIS — Z7401 Bed confinement status: Secondary | ICD-10-CM | POA: Diagnosis not present

## 2024-02-13 DIAGNOSIS — Z936 Other artificial openings of urinary tract status: Secondary | ICD-10-CM | POA: Diagnosis not present

## 2024-02-13 DIAGNOSIS — M129 Arthropathy, unspecified: Secondary | ICD-10-CM | POA: Diagnosis not present

## 2024-02-13 DIAGNOSIS — R5383 Other fatigue: Secondary | ICD-10-CM | POA: Diagnosis not present

## 2024-02-13 LAB — CBC WITH DIFFERENTIAL/PLATELET
Abs Immature Granulocytes: 0.03 K/uL (ref 0.00–0.07)
Basophils Absolute: 0.1 K/uL (ref 0.0–0.1)
Basophils Relative: 1 %
Eosinophils Absolute: 0.2 K/uL (ref 0.0–0.5)
Eosinophils Relative: 2 %
HCT: 34.4 % — ABNORMAL LOW (ref 36.0–46.0)
Hemoglobin: 9.8 g/dL — ABNORMAL LOW (ref 12.0–15.0)
Immature Granulocytes: 0 %
Lymphocytes Relative: 24 %
Lymphs Abs: 2.4 K/uL (ref 0.7–4.0)
MCH: 26.1 pg (ref 26.0–34.0)
MCHC: 28.5 g/dL — ABNORMAL LOW (ref 30.0–36.0)
MCV: 91.7 fL (ref 80.0–100.0)
Monocytes Absolute: 0.6 K/uL (ref 0.1–1.0)
Monocytes Relative: 6 %
Neutro Abs: 6.6 K/uL (ref 1.7–7.7)
Neutrophils Relative %: 67 %
Platelets: 407 K/uL — ABNORMAL HIGH (ref 150–400)
RBC: 3.75 MIL/uL — ABNORMAL LOW (ref 3.87–5.11)
RDW: 15.6 % — ABNORMAL HIGH (ref 11.5–15.5)
WBC: 10 K/uL (ref 4.0–10.5)
nRBC: 0 % (ref 0.0–0.2)

## 2024-02-13 LAB — BASIC METABOLIC PANEL WITH GFR
Anion gap: 17 — ABNORMAL HIGH (ref 5–15)
BUN: 38 mg/dL — ABNORMAL HIGH (ref 8–23)
CO2: 14 mmol/L — ABNORMAL LOW (ref 22–32)
Calcium: 9.4 mg/dL (ref 8.9–10.3)
Chloride: 108 mmol/L (ref 98–111)
Creatinine, Ser: 1.59 mg/dL — ABNORMAL HIGH (ref 0.44–1.00)
GFR, Estimated: 33 mL/min — ABNORMAL LOW (ref >60)
Glucose, Bld: 157 mg/dL — ABNORMAL HIGH (ref 70–99)
Potassium: 4.8 mmol/L (ref 3.5–5.1)
Sodium: 140 mmol/L (ref 135–145)

## 2024-02-13 MED ORDER — CEPHALEXIN 500 MG PO CAPS: 500.0000 mg | ORAL_CAPSULE | Freq: Four times a day (QID) | ORAL | 0 refills | Status: AC

## 2024-02-13 NOTE — ED Triage Notes (Signed)
 BIBA- In need of urostomy supplies for home.Pt is independent and able to change out bags and care for urostomy, just ran out. Pt also has cellulitis in right lower leg per EMS.

## 2024-02-13 NOTE — ED Notes (Signed)
 [facility] called and setup transportation

## 2024-02-13 NOTE — ED Provider Notes (Signed)
**Jordan [name] De-Identified via Obfuscation**  Jordan Jordan [name], Jordan Jordan [name]   CSN: [id number] Arrival date & time: 02/13/24  1739     Patient presents with: No chief complaint on file.   Jordan Jordan [name] is a 78 y.o. female with PMHx HLD, HTN, DM, CKD, urostomy who presents to ED requesting urostomy bag. Patient ran out of bags at home, but states that her current bag is leaking and making the bed wet. Patient denies recent fever, chest pain, SOB, cough, nausea, vomiting, diarrhea. Denies hematuria or malodorous urine.   Of Jordan [name], EMS concerned for cellulitis on right LE. Patient stating that this has been present for a couple of days. Patient stating that home health comes out to wrap her feet - dressing on her foot today has been in place for many days. Patient denies calf pain.   HPI     Prior to Admission medications   Medication Sig Start Date End Date Taking? Authorizing Provider  Accu-Chek FastClix Lancets MISC Use to check blood sugar once daily. Dx Code E11.65 06/01/19   Burchette, Wolm ORN, MD  acetaminophen  (TYLENOL ) 500 MG tablet Take 1,000 mg by mouth every evening. Pain    [provider]  Alcohol  Swabs  PADS Use as directed 05/31/20   Burchette, Wolm ORN, MD  B Complex-C (B-COMPLEX WITH VITAMIN C) tablet Take 1 tablet by mouth daily. 02/03/24   Alexander, Natalie, DO  Blood Glucose Calibration (TRUE METRIX LEVEL 1) Low SOLN Use as directed 05/31/20   Burchette, Wolm ORN, MD  Blood Glucose Monitoring Suppl (ACCU-CHEK NANO SMARTVIEW) w/Device KIT Use to check blood glucose 2 times daily. 06/21/19   Burchette, Wolm ORN, MD  Blood Glucose Monitoring Suppl (TRUE METRIX METER) DEVI Use as directed 05/31/20   Micheal Wolm ORN, MD  Dulaglutide  (TRULICITY ) 0.75 MG/0.5ML SOAJ Inject 0.75 mg into the skin every Wednesday.    [provider]  Ensure Max Protein (ENSURE MAX PROTEIN) LIQD Take 330 mLs (11 oz total) by mouth daily. 02/03/24   Alexander, Natalie, DO  furosemide   (LASIX ) 80 MG tablet Take 1 tablet (80 mg total) by mouth daily. 02/03/24   Alexander, Natalie, DO  gabapentin  (NEURONTIN ) 100 MG capsule TAKE 1 TO 2 CAPSULES BY MOUTH AT NIGHT AS NEEDED FOR NERVE PAIN Patient taking differently: Take 200 mg by mouth daily as needed (for nerve pain). 09/09/23   Burchette, Wolm ORN, MD  glucose blood (ACCU-CHEK SMARTVIEW) test strip USE TO CHECK BLOOD GLUCOSE TWICE DAILY AS DIRECTED 05/31/18   Burchette, Wolm ORN, MD  losartan  (COZAAR ) 100 MG tablet TAKE 1 TABLET BY MOUTH DAILY Patient taking differently: Take 100 mg by mouth in the morning. 12/09/23   Burchette, Wolm ORN, MD  Multiple Vitamin (MULTIVITAMIN) capsule Take 1 capsule by mouth 2 (two) times a week.    [provider]  simvastatin  (ZOCOR ) 40 MG tablet TAKE 1 TABLET BY MOUTH EVERY EVENING 12/09/23   Burchette, Wolm ORN, MD  sodium bicarbonate  650 MG tablet Take 1 tablet (650 mg total) by mouth 2 (two) times daily. 02/02/24   Alexander, Natalie, DO  TRUEplus Lancets 28G MISC Use as directed 05/31/20   Burchette, Wolm ORN, MD  zinc  sulfate, 50mg  elemental zinc , 220 (50 Zn) MG capsule Take 1 capsule (220 mg total) by mouth daily. 02/03/24   Alexander, Natalie, DO    Allergies: Morphine, Onglyza [saxagliptin ], Baclofen, Latex, Lisinopril, Metoclopramide hcl, Penicillins, and Tape    Review of Systems  Skin:  Positive for rash.  Updated Vital Signs BP (!) 151/55   Pulse 69   Temp 97.7 F (36.5 C) (Oral)   Resp 17   SpO2 100%   Physical Exam Vitals and nursing Jordan [name] reviewed.  Constitutional:      General: She is not in acute distress.    Appearance: She is not ill-appearing or toxic-appearing.  HENT:     Head: Normocephalic and atraumatic.     Mouth/Throat:     Mouth: Mucous membranes are moist.     Pharynx: No oropharyngeal exudate or posterior oropharyngeal erythema.  Eyes:     General: No scleral icterus.       Right eye: No discharge.        Left eye: No discharge.      Conjunctiva/sclera: Conjunctivae normal.  Cardiovascular:     Rate and Rhythm: Normal rate.     Pulses: Normal pulses.  Pulmonary:     Effort: Pulmonary effort is normal.  Abdominal:     Comments: Urostomy site appears clean without surrounding erythema or swelling.  Musculoskeletal:     Right lower leg: No edema.     Left lower leg: No edema.  Skin:    General: Skin is warm and dry.     Findings: No rash.     Comments: Swelling and erythema of dorsal right foot extending up shin. Bedside US  with cobblestoning - no concern for drainable abscess. Chronic lymphedema changes on BL LE.  Neurological:     General: No focal deficit present.     Mental Status: She is alert and oriented to person, place, and time. Mental status is at baseline.  Psychiatric:        Mood and Affect: Mood normal.        Behavior: Behavior normal.     (all labs ordered are listed, but only abnormal results are displayed) Labs Reviewed - No data to display  EKG: None  Radiology: No results found.   SABRAUltrasound ED Soft Tissue  Date/Time: 02/13/2024 7:26 PM  Performed by: Hoy Nidia FALCON, PA-C Authorized by: Hoy Nidia FALCON, PA-C   Procedure details:    Indications: evaluate for cellulitis     Transverse view:  Visualized   Images: archived   Location:    Location: lower extremity     Side:  Right Findings:     no abscess present    cellulitis present    Medications Ordered in the ED - No data to display  Clinical Course as of 02/13/24 1935  Sat Feb 13, 2024  1932 Personally evaluated patient, warmth, erythema of right lower extremity, patient states that it has been worsening for past several days.  Initially patient not amenable to obtaining labs, however after shared decision-making with patient and husband at bedside, patient is amenable to x-ray, DVT ultrasound, screening labs [LS]    Clinical Course User Index [LS] Rogelia Jerilynn RAMAN, MD                                  Medical Decision Making  This patient presents to the ED for concern of cellulitis and leaking urostomy bag, this involves an extensive number of treatment options, and is a complaint that carries with it a high risk of complications and morbidity.  The differential diagnosis includes irritant contact dermatitis, DRESS, atopic dermatitis, anaphylaxis, SJS/TEN   Co morbidities that complicate the patient evaluation  HLD, HTN, DM, CKD, urostomy  Additional history obtained:  Dr. Micheal PCP   Problem List / ED Course / Critical interventions / Medication management  Patient presents to ED requesting new urostomy bag as her current one is leaking. Patient declines any infectious symptoms recently. Of Jordan [name], EMS concerned for cellulitis on right LE. Bedside US  showing cobblestoning but no drainable fluid collection. LE is also erythematous.  Rest of physical exam is reassuring. Patient afebrile with stable vitals.  RN obtaining new urostomy bag from upstairs.  Staffed with Dr. Rogelia. Erythema of right LE is anterior and without calf pain - seems more like cellulitis. Will get foot xray and basic labs to further evaluate for further emergent process such as osteomyelitis. I have reviewed the patients home medicines and have made adjustments as needed   Social Determinants of Health:  none  8PM Care of Jordan Jordan [name] transferred to [facility] at the end of my shift as the patient will require reassessment once labs/imaging have resulted. Patient presentation, ED course, and plan of care discussed with review of all pertinent labs and imaging. Please see his/her Jordan [name] for further details regarding further ED course and disposition. Plan at time of handoff is reassess patient after imaging and lab workup. If xray and lab workup is reassuring, I would recommend starting patient on keflex  500mg  4x daily for 7 days. This may be altered or completely changed at the discretion of the oncoming team  pending results of further workup.      Final diagnoses:  None    ED Discharge Orders     None          Hoy Nidia FALCON, [place] 02/13/24 1938    Rogelia Jerilynn RAMAN, MD 02/14/24 [phone]

## 2024-02-13 NOTE — ED Notes (Signed)
 Replaced ostomy bag

## 2024-02-13 NOTE — Discharge Instructions (Signed)
 You were seen today for cellulitis to the right foot as well as needing a urostomy bag replacement.  Your lab work and imaging are reassuring that we have low suspicion for any emergent conditions at this time.  However would recommend you continue follow-up with your PCP for repeat of your CBC and BMP to ensure that you are having improving labs as well as reevaluation for your cellulitis of your right foot.  Please take the antibiotics as prescribed and you for the next 7 days, 4 times a day, ensure that you are staying well-hydrated as well as taking your Lasix .  Return to the ED if you intervene new or worsening symptoms

## 2024-02-13 NOTE — ED Provider Notes (Signed)
  Physical Exam  BP (!) 151/55   Pulse 69   Temp 97.7 F (36.5 C) (Oral)   Resp 17   SpO2 100%   Physical Exam Vitals and nursing note reviewed.  Constitutional:      General: She is not in acute distress.    Appearance: Normal appearance. She is not ill-appearing.  HENT:     Head: Normocephalic and atraumatic.  Cardiovascular:     Rate and Rhythm: Normal rate and regular rhythm.  Pulmonary:     Effort: Pulmonary effort is normal.  Skin:    General: Skin is warm and dry.  Neurological:     General: No focal deficit present.     Mental Status: She is alert. Mental status is at baseline.     Procedures  Procedures  ED Course / MDM   Clinical Course as of 02/13/24 2235  Sat Feb 13, 2024  1932 Personally evaluated patient, warmth, erythema of right lower extremity, patient states that it has been worsening for past several days.  Initially patient not amenable to obtaining labs, however after shared decision-making with patient and husband at bedside, patient is amenable to x-ray, DVT ultrasound, screening labs [LS]  1954 Looking for urostomy bag. Cellulitis of R foot. Awaiting basic labs and XR to rule out osteomyelitis. Anticipate going home with Keflex  x 7 days. Has good follow up with home health.  [CB]    Clinical Course User Index [CB] Beola Terrall RAMAN, PA-C [LS] Rogelia Jerilynn RAMAN, MD   Medical Decision Making Amount and/or Complexity of Data Reviewed Labs: ordered. Radiology: ordered.  Risk Prescription drug management.   Patient care transferred over from [facility] PA-C  Pending labs to rule out osteomyelitis due to having cellulitis on right foot.  No abscess noted on ultrasound per previous provider and Dr. Rogelia.   X-ray does not reveal any gas, only noting some swelling.  CBC at baseline.  BMP does note a mild element However patient is still currently not showing any signs or symptoms of sepsis.  Likely secondary to dehydration as patient says  that she has not had much to drink today.  Will have her continue follow-up with her PCP for further monitoring and reevaluation, repeat CBC and BMP to ensure resolution of symptoms.  Patient prescribed Keflex  for the next 7 days.  Patient case discussed with Dr. Rogelia, who agrees with plan.  Patient vital signs have remained stable throughout the course of patient's time in the ED. Low suspicion for any other emergent pathology at this time. I believe this patient is safe to be discharged. Provided strict return to ER precautions. Patient expressed agreement and understanding of plan. All questions were answered.     Beola Terrall RAMAN, PA-C 02/13/24 2310    Rogelia Jerilynn RAMAN, MD 02/14/24 [phone]

## 2024-02-17 ENCOUNTER — Encounter: Payer: Self-pay | Admitting: Family Medicine

## 2024-02-17 ENCOUNTER — Telehealth: Payer: Self-pay | Admitting: Family Medicine

## 2024-02-17 ENCOUNTER — Ambulatory Visit: Admitting: Family Medicine

## 2024-02-17 VITALS — BP 146/60 | HR 77 | Temp 97.8°F

## 2024-02-17 DIAGNOSIS — E1122 Type 2 diabetes mellitus with diabetic chronic kidney disease: Secondary | ICD-10-CM | POA: Diagnosis not present

## 2024-02-17 DIAGNOSIS — L03115 Cellulitis of right lower limb: Secondary | ICD-10-CM | POA: Diagnosis not present

## 2024-02-17 DIAGNOSIS — N1832 Chronic kidney disease, stage 3b: Secondary | ICD-10-CM | POA: Diagnosis not present

## 2024-02-17 DIAGNOSIS — Z7985 Long-term (current) use of injectable non-insulin antidiabetic drugs: Secondary | ICD-10-CM | POA: Diagnosis not present

## 2024-02-17 DIAGNOSIS — T148XXA Other injury of unspecified body region, initial encounter: Secondary | ICD-10-CM

## 2024-02-17 MED ORDER — TRULICITY 1.5 MG/0.5ML ~~LOC~~ SOAJ
1.5000 mg | SUBCUTANEOUS | 11 refills | Status: AC
Start: 2024-02-17 — End: ?

## 2024-02-17 NOTE — Telephone Encounter (Unsigned)
 Copied from CRM [id number]. Topic: General - Running Late >> Feb 17, 2024  2:26 PM Gennette ORN wrote: Patient/patient representative is calling because they are running late for an appointment. Her spouse stated 10 mins.

## 2024-02-17 NOTE — Patient Instructions (Signed)
 Finish out the antibiotics  Continue with daily dressing changes

## 2024-02-17 NOTE — Progress Notes (Signed)
 Established Patient Office Visit  Subjective   Patient ID: Jordan [name], female    DOB: 1945/07/17  Age: 78 y.o. MRN: [id number]  Chief Complaint  Patient presents with   Medical Management of Chronic Issues    HPI   Jordan [name] is seen for follow-up regarding recent cellulitis right lower extremity with superficial left lower anterior leg wound.  Refer to multiple previous notes for details.  She had recent hospitalization for worsening infection.  Imaging showed no evidence for osteomyelitis.  She apparently went back to ER on the 30th and white count was normal.  Hemoglobin stable at 9.8.  Patient prescribed 7 days of Keflex  which she is taking currently.  They do have home health nurse going out for wound checks.  She and her husband were not remembering exactly when they came out last but they think this either yesterday or the day before.  She does have type 2 diabetes with suboptimal control with recent A1c 8.2%.  This is associated with chronic kidney disease with most recent GFR 33.  Supposed to be on Trulicity  1.5 but currently taking 0.75 mg once weekly.  Tolerating it current dosage.  Denies any recent fevers or chills.  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   PT CAN TRANSFER BED TO CHAIR--CAN STAND BRIEFLY--BUT NOT ABLE TO WALK; RT LEG HYPEREXTENDS BACKWARD AND IS WEAK-HAS A POWER CHAIR   COLONIC POLYPS 07/11/2010   Constipation    Diabetes mellitus    ORAL MED FOR DIABETES   Gallbladder problem    GERD (gastroesophageal reflux disease)    PAST HISTORY GERD--WATCHES DIET - NOT ON ANY MEDS FOR GERD   HYPERLIPIDEMIA 11/29/2009   HYPERTENSION 11/29/2009   Incisional hernia    NO PAIN-NOT CAUSING ANY DISCOMFORT   Kidney infection    Osteoarthritis    Osteomyelitis (HCC)    PONV (postoperative nausea and vomiting)     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HCC)    11-28-14 remains   Shortness of breath    Stroke (cerebrum) (HCC)    Past Surgical History:  Procedure Laterality Date   1992     ABDOMINAL HYSTERECTOMY  1990   partial   ACHILLES TENDON LENGTHENING  1962   ADENOIDECTOMY  1950   APPENDECTOMY     CARPAL TUNNEL RELEASE  1983   CHOLECYSTECTOMY     COLONOSCOPY WITH PROPOFOL  N/A 12/07/2014   Procedure: COLONOSCOPY WITH PROPOFOL ;  Surgeon: Lupita FORBES Commander, MD;  Location: [facility];  Service: Endoscopy;  Laterality: N/A;   LEFT CARPAL TUNNEL RELEASE X 2     LUMBAR SYMPATHETECTOMIES X 2     MULTIPLE RIGHT ANKLE SURGERIES     NEPHROLITHOTOMY  10/01/2011   Procedure: NEPHROLITHOTOMY PERCUTANEOUS;  Surgeon: Garnette Shack, MD;  Location: [facility];  Service: Urology;  Laterality: Right;        PARTIAL AMPUTATION OF LEFT GREAT TOE     1985   radical cystectomy  1992   neurogenic bladder sec CP   REPAIR OF NASAL FRACTURE  1985   REVISION UROSTOMY CUTANEOUS  1992   RIGHT CARPAL TUNNEL RELEASE     RIGHT HIP FLEXION CONTRACTURE REPAIR  1986   RT ANKLE FUSION  1975   SEVERAL RT ANKLE SURGERIES PRIOR TO THE  FUSION   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 She has never used smokeless tobacco. She reports that she does not drink alcohol  and does not use drugs. family history includes Alcoholism in her father and mother; Bladder Cancer in her father; Cancer in her father; Colon cancer (age of onset: 9) in her father; Colon polyps in her sister; Diabetes in her father; Emphysema in her father; Heart attack in her maternal grandfather; Heart disease in her father and maternal grandmother; Heart disease (age of onset: 50) in her mother; Hyperlipidemia in her father and mother; Hypertension in her mother; Liver disease in her father and mother; Sleep apnea in her  father. Allergies  Allergen Reactions   Morphine Shortness Of Breath   Onglyza [Saxagliptin ] Other (See Comments)    Headache within 45 minutes of taking this.   Baclofen Other (See Comments)    GI upset   Latex Other (See Comments)    Irritates the skin sometimes   Lisinopril Cough   Metoclopramide Hcl Other (See Comments)    Reglan = irritable   Penicillins Hives and Other (See Comments)     MAJOR CASE OF HIVES   Tape Other (See Comments)    Irritates the skin sometimes    Review of Systems  Constitutional:  Negative for chills and fever.  Respiratory:  Negative for shortness of breath.   Cardiovascular:  Positive for leg swelling.      Objective:     BP (!) 146/60   Pulse 77   Temp 97.8 F (36.6 C) (Oral)   SpO2 98%  BP Readings from Last 3 Encounters:  02/17/24 (!) 146/60  02/13/24 (!) 151/55  02/08/24 (!) 146/60   Wt Readings from Last 3 Encounters:  02/04/24 185 lb (83.9 kg)  01/28/24 185 lb (83.9 kg)  01/28/24 185 lb (83.9 kg)      Physical Exam Vitals reviewed.  Constitutional:      General: She is not in acute distress. Cardiovascular:     Rate and Rhythm: Normal rate and regular rhythm.  Pulmonary:     Effort: Pulmonary effort is normal.     Breath sounds: No wheezing or rales.  Skin:    Comments: Chronic edema right lower extremity greater than left.  She has compression garment on left lower extremity.  Right reveals some erythema especially involving the lower third of the leg and ankle region.  Very superficial wound right lower anterior leg.  Slight warmth involving areas of erythema.  No foul odor.  No skin necrosis.  Neurological:     Mental Status: She is alert.      No results found for any visits on 02/17/24.    The ASCVD Risk score (Arnett DK, et al., 2019) failed to calculate for the following reasons:   Risk score cannot be calculated because patient has a medical history suggesting prior/existing ASCVD    Assessment &  Plan:   #1 right lower leg superficial skin wound with cellulitis changes currently on Keflex .  Recent imaging showed no osteomyelitis in the foot or leg region.  Her infection is complicated by her immobility, chronic edema, type 2 diabetes, and some cognitive impairment.  - We had arranged for home health skin care nurse and they are coming out and we have  strongly encouraged them to allow home health to continue to come out - Finish out Keflex  -Discussed importance of elevation and local compression to try to aid in healing -Right leg was recleaned and redressed today and rewrapped -Follow-up immediately for any fever or worsening erythema  #2 type 2 diabetes with recent A1c 8.2%.  Titrate Trulicity  to 1.5 mg subcutaneous once weekly.  Will plan to recheck A1c in couple months  Wolm Scarlet, MD

## 2024-02-18 ENCOUNTER — Telehealth: Payer: Self-pay | Admitting: *Deleted

## 2024-02-18 NOTE — Telephone Encounter (Signed)
 Copied from CRM #[id number]. Topic: General - Other >> Feb 18, 2024  2:57 PM Burnard DEL wrote: Reason for CRM: Patient called in stating that she found some ace bandages for the swelling in her foot and ankle,and would like to know if that is okay to use? Please advise.

## 2024-02-19 NOTE — Telephone Encounter (Signed)
 Patient informed of the message below and voiced understanding

## 2024-02-22 NOTE — Telephone Encounter (Signed)
 Both this patient and her husband have some cognitive impairment, which makes situation difficult.  If they are refusing wound care center, not sure there is anything further we can do to make them go.  IF her leg were worsening to extent of, for example, worsening wound or progressive infection, would have to consider where to get Adult Protective Services involved for patient safety.  If leg stable, would not pursue APS at this time  [name] MD [facility] at [facility]

## 2024-02-23 ENCOUNTER — Telehealth: Payer: Self-pay | Admitting: *Deleted

## 2024-02-23 NOTE — Telephone Encounter (Signed)
 Copied from CRM [id number]. Topic: General - Other >> Feb 23, 2024  2:17 PM Burnard DEL wrote: Reason for CRM: Patients husband called to see if order for urostomy patches was sent to [facility] for her?

## 2024-02-23 NOTE — Telephone Encounter (Signed)
 Noted

## 2024-02-24 ENCOUNTER — Ambulatory Visit: Admitting: Family Medicine

## 2024-02-25 NOTE — Telephone Encounter (Signed)
 No orders for urostomy bag has been received at this time

## 2024-02-26 DIAGNOSIS — G809 Cerebral palsy, unspecified: Secondary | ICD-10-CM | POA: Diagnosis not present

## 2024-02-26 DIAGNOSIS — Z936 Other artificial openings of urinary tract status: Secondary | ICD-10-CM | POA: Diagnosis not present

## 2024-02-26 DIAGNOSIS — G834 Cauda equina syndrome: Secondary | ICD-10-CM | POA: Diagnosis not present

## 2024-02-29 ENCOUNTER — Telehealth: Payer: Self-pay | Admitting: *Deleted

## 2024-02-29 NOTE — Telephone Encounter (Signed)
 Copied from CRM [id number]. Topic: Clinical - Home Health Verbal Orders >> Feb 29, 2024  3:53 PM Dedra B wrote: Caller/Agency: Karna from [place] [facility] Number: [phone] Service Requested: Social work evaluation Frequency: Will call after eval if they want to continue Any new concerns about the patient? No

## 2024-03-01 NOTE — Telephone Encounter (Signed)
 Karna informed of OK

## 2024-03-05 ENCOUNTER — Other Ambulatory Visit: Payer: Self-pay

## 2024-03-05 ENCOUNTER — Emergency Department (HOSPITAL_COMMUNITY)
Admission: EM | Admit: 2024-03-05 | Discharge: 2024-03-05 | Disposition: A | Discharge status: Home / Self Care | Attending: Emergency Medicine | Admitting: Emergency Medicine

## 2024-03-05 DIAGNOSIS — Z436 Encounter for attention to other artificial openings of urinary tract: Secondary | ICD-10-CM | POA: Insufficient documentation

## 2024-03-05 DIAGNOSIS — Z91040 Latex allergy status: Secondary | ICD-10-CM | POA: Insufficient documentation

## 2024-03-05 DIAGNOSIS — T83038A Leakage of other indwelling urethral catheter, initial encounter: Secondary | ICD-10-CM | POA: Diagnosis not present

## 2024-03-05 NOTE — ED Notes (Signed)
 Offered to help pt get onto ed stretcher. Pt wanted to stay in her personal wheelchair

## 2024-03-05 NOTE — Discharge Instructions (Signed)
 Return for any problem.  ?

## 2024-03-05 NOTE — ED Triage Notes (Signed)
 Pt has a urostomy that is leaking- doesn't have any more supplies at home.

## 2024-03-05 NOTE — ED Notes (Signed)
 New urostomy bag applied. Barrier powder applied to skin.

## 2024-03-05 NOTE — ED Provider Notes (Signed)
 [facility] EMERGENC[facility] DEPARTMENT AT [facility] Provider Note   CSN: [id number] Arrival date & time: 03/05/24  1058     Patient presents with: No chief complaint on file.   Jordan [name] is a 78 y.o. female.   78 year old female with prior medical history as detailed below presents for evaluation.  Patient reports that she has had a urostomy since 1998.  She has run out of her urostomy bags.  She reports that she is having some trouble sticking the bags around the urostomy.  Therefore she is having some leakage of urine.  She requests some assistance with getting new urostomy bags and a good seal around her urostomy site.  The history is provided by the patient and medical records.       Prior to Admission medications   Medication Sig Start Date End Date Taking? Authorizing Provider  Accu-Chek FastClix Lancets MISC Use to check blood sugar once daily. Dx Code E11.65 06/01/19   Burchette, Wolm ORN, MD  acetaminophen  (TYLENOL ) 500 MG tablet Take 1,000 mg by mouth every evening. Pain    [provider]  Alcohol  Swabs  PADS Use as directed 05/31/20   Burchette, Wolm ORN, MD  B Complex-C (B-COMPLEX WITH VITAMIN C) tablet Take 1 tablet by mouth daily. 02/03/24   Alexander, Natalie, DO  Blood Glucose Calibration (TRUE METRIX LEVEL 1) Low SOLN Use as directed 05/31/20   Burchette, Wolm ORN, MD  Blood Glucose Monitoring Suppl (ACCU-CHEK NANO SMARTVIEW) w/Device KIT Use to check blood glucose 2 times daily. 06/21/19   Burchette, Wolm ORN, MD  Blood Glucose Monitoring Suppl (TRUE METRIX METER) DEVI Use as directed 05/31/20   Burchette, Wolm ORN, MD  Dulaglutide  (TRULICITY ) 1.5 MG/0.5ML SOAJ Inject 1.5 mg into the skin once a week. 02/17/24   Burchette, Wolm ORN, MD  Ensure Max Protein (ENSURE MAX PROTEIN) LIQD Take 330 mLs (11 oz total) by mouth daily. 02/03/24   Alexander, Natalie, DO  furosemide  (LASIX ) 80 MG tablet Take 1 tablet (80 mg total) by mouth daily. 02/03/24   Alexander, Natalie,  DO  gabapentin  (NEURONTIN ) 100 MG capsule TAKE 1 TO 2 CAPSULES BY MOUTH AT NIGHT AS NEEDED FOR NERVE PAIN Patient taking differently: Take 200 mg by mouth daily as needed (for nerve pain). 09/09/23   Burchette, Wolm ORN, MD  glucose blood (ACCU-CHEK SMARTVIEW) test strip USE TO CHECK BLOOD GLUCOSE TWICE DAILY AS DIRECTED 05/31/18   Burchette, Wolm ORN, MD  losartan  (COZAAR ) 100 MG tablet TAKE 1 TABLET BY MOUTH DAILY Patient taking differently: Take 100 mg by mouth in the morning. 12/09/23   Burchette, Wolm ORN, MD  Multiple Vitamin (MULTIVITAMIN) capsule Take 1 capsule by mouth 2 (two) times a week.    [provider]  simvastatin  (ZOCOR ) 40 MG tablet TAKE 1 TABLET BY MOUTH EVERY EVENING 12/09/23   Burchette, Wolm ORN, MD  sodium bicarbonate  650 MG tablet Take 1 tablet (650 mg total) by mouth 2 (two) times daily. 02/02/24   Alexander, Natalie, DO  TRUEplus Lancets 28G MISC Use as directed 05/31/20   Burchette, Wolm ORN, MD  zinc  sulfate, 50mg  elemental zinc , 220 (50 Zn) MG capsule Take 1 capsule (220 mg total) by mouth daily. 02/03/24   Alexander, Natalie, DO    Allergies: Morphine, Onglyza [saxagliptin ], Baclofen, Latex, Lisinopril, Metoclopramide hcl, Penicillins, and Tape    Review of Systems  All other systems reviewed and are negative.   Updated Vital Signs BP (!) 180/73 (BP Location: Right Arm)  Pulse 82   Temp 98.2 F (36.8 C) (Oral)   Resp 18   SpO2 96%   Physical Exam Vitals and nursing note reviewed.  Constitutional:      General: She is not in acute distress.    Appearance: Normal appearance. She is well-developed.  HENT:     Head: Normocephalic and atraumatic.  Eyes:     Conjunctiva/sclera: Conjunctivae normal.     Pupils: Pupils are equal, round, and reactive to light.  Cardiovascular:     Rate and Rhythm: Normal rate and regular rhythm.     Heart sounds: Normal heart sounds.  Pulmonary:     Effort: Pulmonary effort is normal. No respiratory distress.     Breath  sounds: Normal breath sounds.  Abdominal:     General: There is no distension.     Palpations: Abdomen is soft.     Tenderness: There is no abdominal tenderness.  Musculoskeletal:        General: No deformity. Normal range of motion.     Cervical back: Normal range of motion and neck supple.  Skin:    General: Skin is warm and dry.     Comments: Urostomy site is in place in the left lower quadrant.  The attached bag is poorly adhered to the skin surface around the ostomy.  There is leakage of urine.  There is no significant surrounding erythema or skin breakdown.  Neurological:     General: No focal deficit present.     Mental Status: She is alert and oriented to person, place, and time.     (all labs ordered are listed, but only abnormal results are displayed) Labs Reviewed - No data to display  EKG: None  Radiology: No results found.   Procedures   Medications Ordered in the ED - No data to display                                  Medical Decision Making Patient reports that her urostomy bag is no longer adherent.  She is out of her urostomy bag.  She requests assistance with getting a new urostomy bag.  Patient without other complaint.  Exam does not suggest other acute pathology.  Nursing assisted patient with application of new urostomy bag and some supplies were given to the patient.  Patient is provide for discharge.  Importance of close follow-up stressed.  Strict return precautions given and understood.        Final diagnoses:  Attention to urostomy [facility])    ED Discharge Orders     None          Laurice Maude BROCKS, MD 03/05/24 2155

## 2024-03-10 DIAGNOSIS — G809 Cerebral palsy, unspecified: Secondary | ICD-10-CM | POA: Diagnosis not present

## 2024-03-10 DIAGNOSIS — G834 Cauda equina syndrome: Secondary | ICD-10-CM | POA: Diagnosis not present

## 2024-03-10 DIAGNOSIS — Z936 Other artificial openings of urinary tract status: Secondary | ICD-10-CM | POA: Diagnosis not present

## 2024-03-18 ENCOUNTER — Telehealth: Payer: Self-pay

## 2024-03-18 NOTE — Telephone Encounter (Signed)
 Noted.  Jordan Covey MD [facility]

## 2024-03-18 NOTE — Telephone Encounter (Signed)
 Copied from CRM #[id number]. Topic: Clinical - Home Health Verbal Orders >> Mar 18, 2024  9:32 AM Macario HERO wrote: Caller/Agency: Barnie from [facility] Callback Number: [phone] Service Requested: Medical Social Worker Frequency: 1 week 1 of 10/5 Any new concerns about the patient? Yes, missed visit. Barnie from [facility] called said that she was scheduled to see patient this week but was unable to reach patient. Their RN was able to see her and she's fine but just wanted to notify provider.

## 2024-03-28 ENCOUNTER — Telehealth: Payer: Self-pay

## 2024-03-28 MED ORDER — TRUE METRIX METER DEVI: 1 refills | Status: AC

## 2024-03-28 NOTE — Telephone Encounter (Signed)
 Rx done.

## 2024-03-28 NOTE — Telephone Encounter (Signed)
 Copied from CRM [id number]. Topic: Clinical - Order For Equipment >> Mar 28, 2024  3:20 PM Berneda FALCON wrote: Reason for CRM: [name], Nurse from [place] is calling to request a glucometer for this patient as she misplaced her other one and has no way of checking her blood sugar.  [id number] [place], [place] - [address] AT [facility] OF [facility] [address] & [facility] [address] [place] [address] Phone: [phone] Fax: [phone] Hours: Not open 24 hours  [phone] (confidential vm)

## 2024-04-04 ENCOUNTER — Encounter: Payer: Medicare (Managed Care) | Admitting: Family Medicine

## 2024-04-04 ENCOUNTER — Ambulatory Visit: Payer: Medicare (Managed Care)

## 2024-06-03 ENCOUNTER — Telehealth: Payer: Self-pay | Admitting: *Deleted

## 2024-06-03 NOTE — Telephone Encounter (Unsigned)
 Copied from CRM #[id number]. Topic: General - Other >> Jun 03, 2024 11:59 AM Pinkey ORN wrote: Reason for CRM: [facility] >> Jun 03, 2024 11:59 AM Pinkey ORN wrote: [place] [phone] [facility]  Called on behalf of the patient, states that patient is running through her ostomy bags roughly about 40-45 bags monthly. States she doesn't have any bags to provide patient. She also mentioned that the patient's husband claims that the bags are leaking and is wanting to know if there's anything the office could do to help assist the patient.

## 2024-06-06 NOTE — Telephone Encounter (Signed)
 Spoke with the patient's husband and offered assistance as below.  Patient declined and stated he would prefer to discuss this when he comes in this afternoon for his appt.

## 2024-06-17 ENCOUNTER — Ambulatory Visit: Payer: Medicare (Managed Care) | Admitting: Family Medicine

## 2024-06-17 ENCOUNTER — Encounter: Payer: Self-pay | Admitting: Family Medicine

## 2024-06-17 VITALS — BP 146/60 | HR 86 | Temp 97.6°F

## 2024-06-17 DIAGNOSIS — E1122 Type 2 diabetes mellitus with diabetic chronic kidney disease: Secondary | ICD-10-CM | POA: Diagnosis not present

## 2024-06-17 DIAGNOSIS — E785 Hyperlipidemia, unspecified: Secondary | ICD-10-CM | POA: Diagnosis not present

## 2024-06-17 DIAGNOSIS — N1832 Chronic kidney disease, stage 3b: Secondary | ICD-10-CM

## 2024-06-17 DIAGNOSIS — R413 Other amnesia: Secondary | ICD-10-CM

## 2024-06-17 DIAGNOSIS — I10 Essential (primary) hypertension: Secondary | ICD-10-CM

## 2024-06-17 DIAGNOSIS — Z7985 Long-term (current) use of injectable non-insulin antidiabetic drugs: Secondary | ICD-10-CM | POA: Diagnosis not present

## 2024-06-17 DIAGNOSIS — R600 Localized edema: Secondary | ICD-10-CM | POA: Diagnosis not present

## 2024-06-17 LAB — BASIC METABOLIC PANEL WITH GFR
BUN: 59 mg/dL — ABNORMAL HIGH (ref 6–23)
CO2: 19 meq/L (ref 19–32)
Calcium: 9.4 mg/dL (ref 8.4–10.5)
Chloride: 107 meq/L (ref 96–112)
Creatinine, Ser: 1.93 mg/dL — ABNORMAL HIGH (ref 0.40–1.20)
GFR: 24.25 mL/min — ABNORMAL LOW
Glucose, Bld: 205 mg/dL — ABNORMAL HIGH (ref 70–99)
Potassium: 5.5 meq/L — ABNORMAL HIGH (ref 3.5–5.1)
Sodium: 137 meq/L (ref 135–145)

## 2024-06-17 LAB — VITAMIN B12: Vitamin B-12: 171 pg/mL — ABNORMAL LOW (ref 211–911)

## 2024-06-17 LAB — HEMOGLOBIN A1C: Hgb A1c MFr Bld: 7.8 % — ABNORMAL HIGH (ref 4.6–6.5)

## 2024-06-17 LAB — TSH: TSH: 1.73 u[IU]/mL (ref 0.35–5.50)

## 2024-06-17 NOTE — Progress Notes (Signed)
 "  Established Patient Office Visit  Subjective   Patient ID: Jordan [name], female    DOB: 11-28-1945  Age: 79 y.o. MRN: [id number]  Chief Complaint  Patient presents with   Foot Swelling         HPI    Jordan [name] is seen today accompanied by her husband and a friend who has been assisting both her and her husband for several years, [employer], [employer].  Jordan [name] has 1 son who lives in [place]  and apparently he has expressed some concerns regarding her cognitive changes recently.  Jordan [name] has observed that both [place] and her husband have had some short-term memory issues for probably least couple years and possibly longer.  For example, she had talked to him about today's appointment but when she showed up this morning to pick them up they did not seem to remember.  Jordan [name] has multiple chronic problems including history of hypertension, venous stasis lower extremities, chronic bilateral lower extremity edema right greater than left, type 2 diabetes, chronic kidney disease, history of cerebral palsy, history of kidney stones, history of total cystectomy, hyperlipidemia, history of chronic normocytic anemia.  The friend who accompanies her today is interested in establishing diagnosis for memory concerns with the hopes they can get some additional services.  Currently Jordan [name] and her husband live alone.  We have had concerns for quite some time about medication compliance.  She is currently on Trulicity , losartan , Lasix , and simvastatin   Ms. Crispen had MRI brain 9-24 which showed no acute intracranial abnormality.  Stable MRI appearance of brain since 2013 with evidence of congenital periventricular leukomalacia.  Jordan [name] is not aware of any known family history of dementia.  Chronic ongoing edema especially following the right lower extremity.  She uses support hose on the left but a struggle to get 1 on the right.  Denies any current skin breakdown.  She has chronic ichthyosis involving both  lower extremities.  Struggles to elevate leg frequently.  Currently not using any compression on the right side.  Is on Lasix  80 mg daily.  Denies any orthopnea or dyspnea.  No right foot pain.  There has been history of poor compliance with follow-up.  We ordered venous Dopplers lower extremities back in July and patient never went.  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   PT CAN TRANSFER BED TO CHAIR--CAN STAND BRIEFLY--BUT NOT ABLE TO WALK; RT LEG HYPEREXTENDS BACKWARD AND IS WEAK-HAS A POWER CHAIR   COLONIC POLYPS 07/11/2010   Constipation    Diabetes mellitus    ORAL MED FOR DIABETES   Gallbladder problem    GERD (gastroesophageal reflux disease)    PAST HISTORY GERD--WATCHES DIET - NOT ON ANY MEDS FOR GERD   HYPERLIPIDEMIA 11/29/2009   HYPERTENSION 11/29/2009   Incisional hernia    NO PAIN-NOT CAUSING ANY DISCOMFORT   Kidney infection    Osteoarthritis    Osteomyelitis (HCC)    PONV (postoperative nausea and vomiting)    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HCC)    11-28-14 remains   Shortness of breath    Stroke (cerebrum) [facility])    Past Surgical History:  Procedure Laterality Date   1992     ABDOMINAL HYSTERECTOMY  1990   partial  ACHILLES TENDON LENGTHENING  1962   ADENOIDECTOMY  1950   APPENDECTOMY     CARPAL TUNNEL RELEASE  1983   CHOLECYSTECTOMY     COLONOSCOPY WITH PROPOFOL  N/A 12/07/2014   Procedure: COLONOSCOPY WITH PROPOFOL ;  Surgeon: Lupita FORBES Commander, MD;  Location: [facility];  Service: Endoscopy;  Laterality: N/A;   LEFT CARPAL TUNNEL RELEASE X 2     LUMBAR SYMPATHETECTOMIES X 2     MULTIPLE RIGHT ANKLE SURGERIES     NEPHROLITHOTOMY  10/01/2011   Procedure: NEPHROLITHOTOMY PERCUTANEOUS;  Surgeon: Garnette Shack, MD;  Location: [facility];  Service: Urology;  Laterality: Right;        PARTIAL AMPUTATION OF  LEFT GREAT TOE     1985   radical cystectomy  1992   neurogenic bladder sec CP   REPAIR OF NASAL FRACTURE  1985   REVISION UROSTOMY CUTANEOUS  1992   RIGHT CARPAL TUNNEL RELEASE     RIGHT HIP FLEXION CONTRACTURE REPAIR  1986   RT ANKLE FUSION  1975   SEVERAL RT ANKLE SURGERIES PRIOR TO THE FUSION   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 She has never used smokeless tobacco. She reports that she does not drink alcohol  and does not use drugs. family history includes Alcoholism in her father and mother; Bladder Cancer in her father; Cancer in her father; Colon cancer (age of onset: 30) in her father; Colon polyps in her sister; Diabetes in her father; Emphysema in her father; Heart attack in her maternal grandfather; Heart disease in her father and maternal grandmother; Heart disease (age of onset: 47) in her mother; Hyperlipidemia in her father and mother; Hypertension in her mother; Liver disease in her father and mother; Sleep apnea in her father. Allergies[1]  Review of Systems  Constitutional:  Negative for malaise/fatigue.  Eyes:  Negative for blurred vision.  Respiratory:  Negative for cough and shortness of breath.   Cardiovascular:  Positive for leg swelling. Negative for chest pain, palpitations and orthopnea.  Neurological:  Negative for dizziness, weakness and headaches.      Objective:     BP (!) 146/60   Pulse 86   Temp 97.6 F (36.4 C) (Oral)   SpO2 96%  BP Readings from Last 3 Encounters:  06/17/24 (!) 146/60  03/05/24 (!) 159/50  02/17/24 (!) 146/60   Wt Readings from Last 3 Encounters:  02/04/24 185 lb (83.9 kg)  01/28/24 185 lb (83.9 kg)  01/28/24 185 lb (83.9 kg)      Physical Exam Vitals reviewed.  Constitutional:      General: She is not in acute distress.    Appearance: She is not diaphoretic.   Cardiovascular:     Rate and Rhythm: Normal rate and regular rhythm.  Pulmonary:     Effort: Pulmonary effort is normal.     Breath sounds: Normal breath sounds. No wheezing or rales.  Musculoskeletal:     Right lower leg: Edema present.     Left lower leg: Edema present.     Comments: She has bilateral leg edema but right greater than left and this has been true for several years.  No skin breakdown.  She has very dry ichthyotic skin bilaterally with thick scales.  No visible ulcers.  Both feet are warm to  touch.  Neurological:     General: No focal deficit present.     Mental Status: She is alert.     Comments: 24/30 on MMSE.      No results found for any visits on 06/17/24.  Last CBC Lab Results  Component Value Date   WBC 10.0 02/13/2024   HGB 9.8 (L) 02/13/2024   HCT 34.4 (L) 02/13/2024   MCV 91.7 02/13/2024   MCH 26.1 02/13/2024   RDW 15.6 (H) 02/13/2024   PLT 407 (H) 02/13/2024   Last metabolic panel Lab Results  Component Value Date   GLUCOSE 157 (H) 02/13/2024   NA 140 02/13/2024   K 4.8 02/13/2024   CL 108 02/13/2024   CO2 14 (L) 02/13/2024   BUN 38 (H) 02/13/2024   CREATININE 1.59 (H) 02/13/2024   GFRNONAA 33 (L) 02/13/2024   CALCIUM 9.4 02/13/2024   PHOS 3.6 07/09/2014   PROT 6.9 01/28/2024   ALBUMIN 3.0 (L) 01/28/2024   BILITOT 0.5 01/28/2024   ALKPHOS 93 01/28/2024   AST 18 01/28/2024   ALT 16 01/28/2024   ANIONGAP 17 (H) 02/13/2024   Last lipids Lab Results  Component Value Date   CHOL 148 12/21/2023   HDL 33.00 (L) 12/21/2023   LDLCALC 89 12/21/2023   LDLDIRECT 128.0 06/20/2022   TRIG 130.0 12/21/2023   CHOLHDL 4 12/21/2023   Last hemoglobin A1c Lab Results  Component Value Date   HGBA1C 8.2 (H) 12/21/2023   Last thyroid  functions Lab Results  Component Value Date   TSH 2.095 10/26/2023   T3TOTAL 90 01/26/2019   FREET4 1.39 01/26/2019   Last vitamin B12 and Folate Lab Results  Component Value Date   VITAMINB12 231 (L)  01/26/2019      The ASCVD Risk score (Arnett DK, et al., 2019) failed to calculate for the following reasons:   The 2019 ASCVD risk score is only valid for ages 28 to 71   Risk score cannot be calculated because patient has a medical history suggesting prior/existing ASCVD   * - Cholesterol units were assumed    Assessment & Plan:   #1 bilateral leg edema right greater than left.  She has had years of asymmetric edema with suspected predominantly venous stasis.  Spends most of her day sitting.  Struggles to elevate this much.  Already on Lasix  80 mg daily.  No skin breakdown currently.  -Stressed importance of elevation if possible - Stressed importance of compression.  She has struggled tremendously with venous compression stockings in the past especially on the right side.  We have recommended ace wraps currently to try to help with some counter compression and watch closely for any skin breakdown  #2 memory loss, cognitive decline.  Scored 24/30 on MMSE today Patient had MRI brain 9/24.  Denies any recent headaches.  Suspect she may have some dementia.  Son and others who knows the family and see them regularly states she has had some definite memory decline probably over at least 2 years if not longer.  Check B12 and TSH.  Set up neurology referral  #3 hypertension.  Patient on losartan .  Questionable compliance of medication.  Blood pressure up slightly today.  Check basic metabolic panel with labs today  #4 type 2 diabetes.  Patient on Trulicity .  Recheck A1c today.  #5 hyperlipidemia.  She is on simvastatin .  Lipids were checked last summer.    Return in about 3 months (around 09/15/2024).    Wolm Scarlet, MD     [  1]  Allergies Allergen Reactions   Morphine Shortness Of Breath   Onglyza [Saxagliptin ] Other (See Comments)    Headache within 45 minutes of taking this.   Baclofen Other (See Comments)    GI upset   Latex Other (See Comments)    Irritates the skin  sometimes   Lisinopril Cough   Metoclopramide Hcl Other (See Comments)    Reglan = irritable   Penicillins Hives and Other (See Comments)     MAJOR CASE OF HIVES   Tape Other (See Comments)    Irritates the skin sometimes   "

## 2024-06-17 NOTE — Patient Instructions (Signed)
 Elevate leg as much as possible.  Try to do daily compression wrap- and apply first thing in morning.

## 2024-06-19 ENCOUNTER — Ambulatory Visit: Payer: Self-pay | Admitting: Family Medicine

## 2024-06-24 ENCOUNTER — Encounter: Payer: Self-pay | Admitting: Family Medicine

## 2024-06-24 ENCOUNTER — Ambulatory Visit: Admitting: Family Medicine

## 2024-06-24 VITALS — BP 150/66 | HR 66 | Temp 98.1°F

## 2024-06-24 DIAGNOSIS — E1122 Type 2 diabetes mellitus with diabetic chronic kidney disease: Secondary | ICD-10-CM

## 2024-06-24 DIAGNOSIS — R413 Other amnesia: Secondary | ICD-10-CM

## 2024-06-24 DIAGNOSIS — N184 Chronic kidney disease, stage 4 (severe): Secondary | ICD-10-CM

## 2024-06-24 DIAGNOSIS — E538 Deficiency of other specified B group vitamins: Secondary | ICD-10-CM | POA: Diagnosis not present

## 2024-06-24 DIAGNOSIS — I10 Essential (primary) hypertension: Secondary | ICD-10-CM

## 2024-06-24 DIAGNOSIS — N1832 Chronic kidney disease, stage 3b: Secondary | ICD-10-CM

## 2024-06-24 MED ORDER — LOSARTAN POTASSIUM 100 MG PO TABS: 100.0000 mg | ORAL_TABLET | Freq: Every day | ORAL | 3 refills | Status: AC

## 2024-06-24 MED ORDER — CYANOCOBALAMIN 1000 MCG/ML IJ SOLN
1000.0000 ug | Freq: Once | INTRAMUSCULAR | Status: AC
  Administered 2024-06-24: 1000 ug via INTRAMUSCULAR

## 2024-06-24 MED ORDER — FUROSEMIDE 80 MG PO TABS: 80.0000 mg | ORAL_TABLET | Freq: Every day | ORAL | 3 refills | Status: AC

## 2024-06-24 NOTE — Progress Notes (Unsigned)
 "  Established Patient Office Visit  Subjective   Patient ID: Jordan [name], female    DOB: June 13, 1946  Age: 79 y.o. MRN: [id number]  Chief Complaint  Patient presents with   Altered Mental Status    HPI  {History (Optional):23778}  Jordan [name] is here accompanied by husband with concerns for recent memory loss.  Refer to last note for details.  Recent MMSE 24/30.  MRI brain 9-24 no acute findings.  We did some follow-up labs.  Her TSH was normal.  B12 was low at 172.  She has chronic kidney disease with recent GFR of 24.  Potassium 5.5.  Does not taking potassium supplements.  Recent A1c 7.8%.  Jordan [name] does not drive.  Her husband recently was caught driving the wrong way on the street and is currently not driving.  He was actually admitted overnight for observation and was discharged and accompanies her today.  We have placed neurology referral for Ms. Algeo and that is pending.  We have had concerns previously regarding medications and compliance.  She brings in her bottles today and has no refills on her furosemide  or losartan .  Not clear if she is taking these regularly.  She is also supposedly on Trulicity  injections weekly.  She does have a bottle of simvastatin .  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   PT CAN TRANSFER BED TO CHAIR--CAN STAND BRIEFLY--BUT NOT ABLE TO WALK; RT LEG HYPEREXTENDS BACKWARD AND IS WEAK-HAS A POWER CHAIR   COLONIC POLYPS 07/11/2010   Constipation    Diabetes mellitus    ORAL MED FOR DIABETES   Gallbladder problem    GERD (gastroesophageal reflux disease)    PAST HISTORY GERD--WATCHES DIET - NOT ON ANY MEDS FOR GERD   HYPERLIPIDEMIA 11/29/2009   HYPERTENSION 11/29/2009   Incisional hernia    NO PAIN-NOT CAUSING ANY DISCOMFORT   Kidney infection    Osteoarthritis    Osteomyelitis (HCC)    PONV  (postoperative nausea and vomiting)    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HCC)    11-28-14 remains   Shortness of breath    Stroke (cerebrum) (HCC)    Past Surgical History:  Procedure Laterality Date   1992     ABDOMINAL HYSTERECTOMY  1990   partial   ACHILLES TENDON LENGTHENING  1962   ADENOIDECTOMY  1950   APPENDECTOMY     CARPAL TUNNEL RELEASE  1983   CHOLECYSTECTOMY     COLONOSCOPY WITH PROPOFOL  N/A 12/07/2014   Procedure: COLONOSCOPY WITH PROPOFOL ;  Surgeon: Lupita FORBES Commander, MD;  Location: [facility];  Service: Endoscopy;  Laterality: N/A;   LEFT CARPAL TUNNEL RELEASE X 2     LUMBAR SYMPATHETECTOMIES X 2     MULTIPLE RIGHT ANKLE SURGERIES     NEPHROLITHOTOMY  10/01/2011   Procedure: NEPHROLITHOTOMY PERCUTANEOUS;  Surgeon: Garnette Shack, MD;  Location: [facility];  Service: Urology;  Laterality: Right;        PARTIAL AMPUTATION OF LEFT GREAT TOE     1985   radical cystectomy  1992   neurogenic bladder sec CP   REPAIR OF NASAL FRACTURE  1985   REVISION UROSTOMY CUTANEOUS  1992   RIGHT CARPAL TUNNEL RELEASE     RIGHT HIP FLEXION CONTRACTURE REPAIR  1986   RT  ANKLE FUSION  1975   SEVERAL RT ANKLE SURGERIES PRIOR TO THE FUSION   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 She has never used smokeless tobacco. She reports that she does not drink alcohol  and does not use drugs. family history includes Alcoholism in her father and mother; Bladder Cancer in her father; Cancer in her father; Colon cancer (age of onset: 48) in her father; Colon polyps in her sister; Diabetes in her father; Emphysema in her father; Heart attack in her maternal grandfather; Heart disease in her father and maternal grandmother; Heart disease (age of onset: 36) in her mother; Hyperlipidemia in her father and mother; Hypertension in her mother; Liver disease in her father  and mother; Sleep apnea in her father. Allergies[1]  Review of Systems  Constitutional:  Negative for chills and fever.  Respiratory:  Negative for shortness of breath.   Cardiovascular:  Negative for chest pain.  Gastrointestinal:  Negative for abdominal pain.  Genitourinary:  Negative for dysuria.      Objective:     BP (!) 150/66   Pulse 66   Temp 98.1 F (36.7 C) (Oral)   SpO2 98%  BP Readings from Last 3 Encounters:  06/24/24 (!) 150/66  06/17/24 (!) 146/60  03/05/24 (!) 159/50   Wt Readings from Last 3 Encounters:  02/04/24 185 lb (83.9 kg)  01/28/24 185 lb (83.9 kg)  01/28/24 185 lb (83.9 kg)      Physical Exam Vitals reviewed.  Constitutional:      General: She is not in acute distress. Cardiovascular:     Rate and Rhythm: Normal rate and regular rhythm.  Pulmonary:     Effort: Pulmonary effort is normal.     Breath sounds: Normal breath sounds. No wheezing or rales.  Neurological:     Mental Status: She is alert.      No results found for any visits on 06/24/24.  Last CBC Lab Results  Component Value Date   WBC 10.0 02/13/2024   HGB 9.8 (L) 02/13/2024   HCT 34.4 (L) 02/13/2024   MCV 91.7 02/13/2024   MCH 26.1 02/13/2024   RDW 15.6 (H) 02/13/2024   PLT 407 (H) 02/13/2024   Last metabolic panel Lab Results  Component Value Date   GLUCOSE 205 (H) 06/17/2024   NA 137 06/17/2024   K 5.5 No hemolysis seen (H) 06/17/2024   CL 107 06/17/2024   CO2 19 06/17/2024   BUN 59 (H) 06/17/2024   CREATININE 1.93 (H) 06/17/2024   GFR 24.25 (L) 06/17/2024   CALCIUM 9.4 06/17/2024   PHOS 3.6 07/09/2014   PROT 6.9 01/28/2024   ALBUMIN 3.0 (L) 01/28/2024   BILITOT 0.5 01/28/2024   ALKPHOS 93 01/28/2024   AST 18 01/28/2024   ALT 16 01/28/2024   ANIONGAP 17 (H) 02/13/2024   Last hemoglobin A1c Lab Results  Component Value Date   HGBA1C 7.8 (H) 06/17/2024   Last thyroid  functions Lab Results  Component Value Date   TSH 1.73 06/17/2024   T3TOTAL  90 01/26/2019   FREET4 1.39 01/26/2019   Last vitamin B12 and Folate Lab Results  Component Value Date   VITAMINB12 171 (L) 06/17/2024      The ASCVD Risk score (Arnett DK, et al., 2019) failed to calculate for the  following reasons:   The 2019 ASCVD risk score is only valid for ages 43 to 42   Risk score cannot be calculated because patient has a medical history suggesting prior/existing ASCVD   * - Cholesterol units were assumed    Assessment & Plan:   #1 memory loss with concern for dementia.  Recent MMSE 24/30.  Did have recent low B12 172.  See separate problem of B12 deficiency below.  Neurology referral has been placed.  We have significant concerns regarding safety of patient and her husband given the fact that both of them have had significant cognitive decline.  #2 B12 deficiency.  Recommend B12 1000 mcg IM and patient consents.  We will plan to do this weekly for 4 weeks and then transition to oral  #3 type 2 diabetes with recent A1c 7.8%.  Given her age and multiple comorbidities control range of less than 8 is reasonable.  #4 chronic kidney disease with recent GFR of 24.  Stressed importance of adequate hydration.  Recheck basic metabolic panel in a few weeks  #5 history of hypertension.  Up slightly today.  Not clear if she is taking her losartan  regularly.  Refills provided   No follow-ups on file.    Wolm Scarlet, MD     [1]  Allergies Allergen Reactions   Morphine Shortness Of Breath   Onglyza [Saxagliptin ] Other (See Comments)    Headache within 45 minutes of taking this.   Baclofen Other (See Comments)    GI upset   Latex Other (See Comments)    Irritates the skin sometimes   Lisinopril Cough   Metoclopramide Hcl Other (See Comments)    Reglan = irritable   Penicillins Hives and Other (See Comments)     MAJOR CASE OF HIVES   Tape Other (See Comments)    Irritates the skin sometimes   "

## 2024-07-05 ENCOUNTER — Telehealth: Payer: Self-pay | Admitting: *Deleted

## 2024-07-05 NOTE — Telephone Encounter (Signed)
 Patient's son Lynwood informed of patient last visit and PCP recommendations.

## 2024-07-05 NOTE — Telephone Encounter (Signed)
 Copied from CRM #[id number]. Topic: Appointments - Appointment Info/Confirmation >> Jul 05, 2024  3:38 PM Mercedes MATSU wrote: Patients son Lynwood Brought called in wanting to speak with a nurse or Dr. Micheal about the patients last appointment. Patients son is requesting a call back and can be reached at [phone].

## 2024-07-05 NOTE — Telephone Encounter (Signed)
Please see additional encounter.

## 2024-07-05 NOTE — Telephone Encounter (Signed)
 Copied from CRM #[id number]. Topic: Appointments - Appointment Info/Confirmation >> Jul 05, 2024  3:38 PM Mercedes MATSU wrote: Patients son Lynwood Brought called in wanting to speak with a nurse or Dr. Micheal about the patients last appointment. Patients son is requesting a call back and can be reached at [phone].

## 2024-09-20 ENCOUNTER — Ambulatory Visit
